# Patient Record
Sex: Male | Born: 1955 | Race: Black or African American | Hispanic: No | Marital: Married | Smoking: Current every day smoker
Health system: Southern US, Community
[De-identification: ages and names within clinical notes are randomized; demographics above are authoritative.]

## PROBLEM LIST (undated history)

## (undated) ENCOUNTER — Emergency Department: Admission: EM | Discharge status: Absent without leave | Payer: Medicare Other

## (undated) DIAGNOSIS — S62109A Fracture of unspecified carpal bone, unspecified wrist, initial encounter for closed fracture: Secondary | ICD-10-CM

## (undated) DIAGNOSIS — K7460 Unspecified cirrhosis of liver: Secondary | ICD-10-CM

## (undated) DIAGNOSIS — N179 Acute kidney failure, unspecified: Secondary | ICD-10-CM

## (undated) HISTORY — DX: Fracture of unspecified carpal bone, unspecified wrist, initial encounter for closed fracture: S62.109A

## (undated) HISTORY — DX: Acute kidney failure, unspecified: N17.9

## (undated) HISTORY — PX: FRACTURE SURGERY: SHX138

---

## 2005-05-13 ENCOUNTER — Other Ambulatory Visit: Payer: Self-pay

## 2005-05-13 ENCOUNTER — Emergency Department: Payer: Self-pay | Admitting: Emergency Medicine

## 2005-05-13 IMAGING — CR DG CHEST 2V
1 series · 2 of 2 positions shown · non-contrast
Comparison: none

REASON FOR EXAM: chest pain
COMMENTS:

PROCEDURE:     DXR - DXR CHEST PA (OR AP) AND LATERAL  - [DATE] [DATE]
RESULT:     The lungs are hyperinflated.  There is no focal infiltrate.  The
heart is not enlarged. The pulmonary vascularity is not engorged.  There is
tortuosity of the descending thoracic aorta.

[Series 1: view not recorded · 0.17mm/px · 2 of 2 slices shown]
[im 1/2]
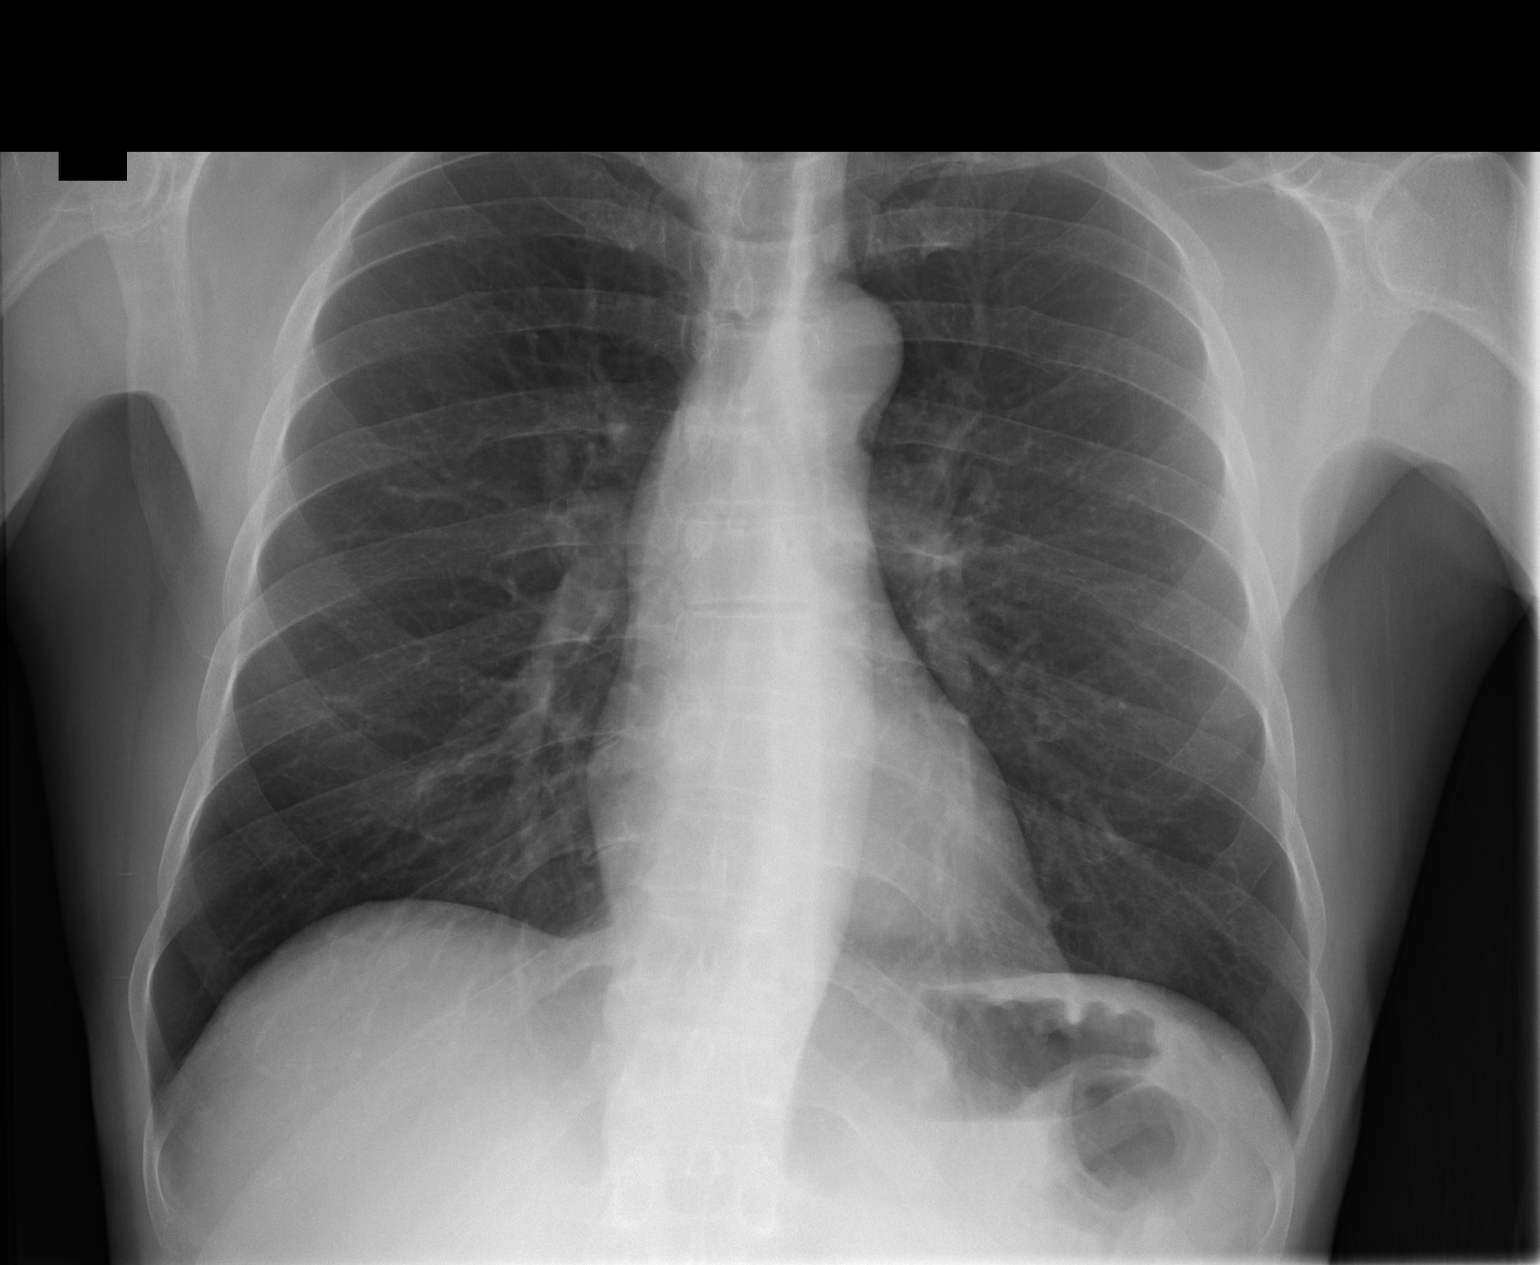
[im 2/2]
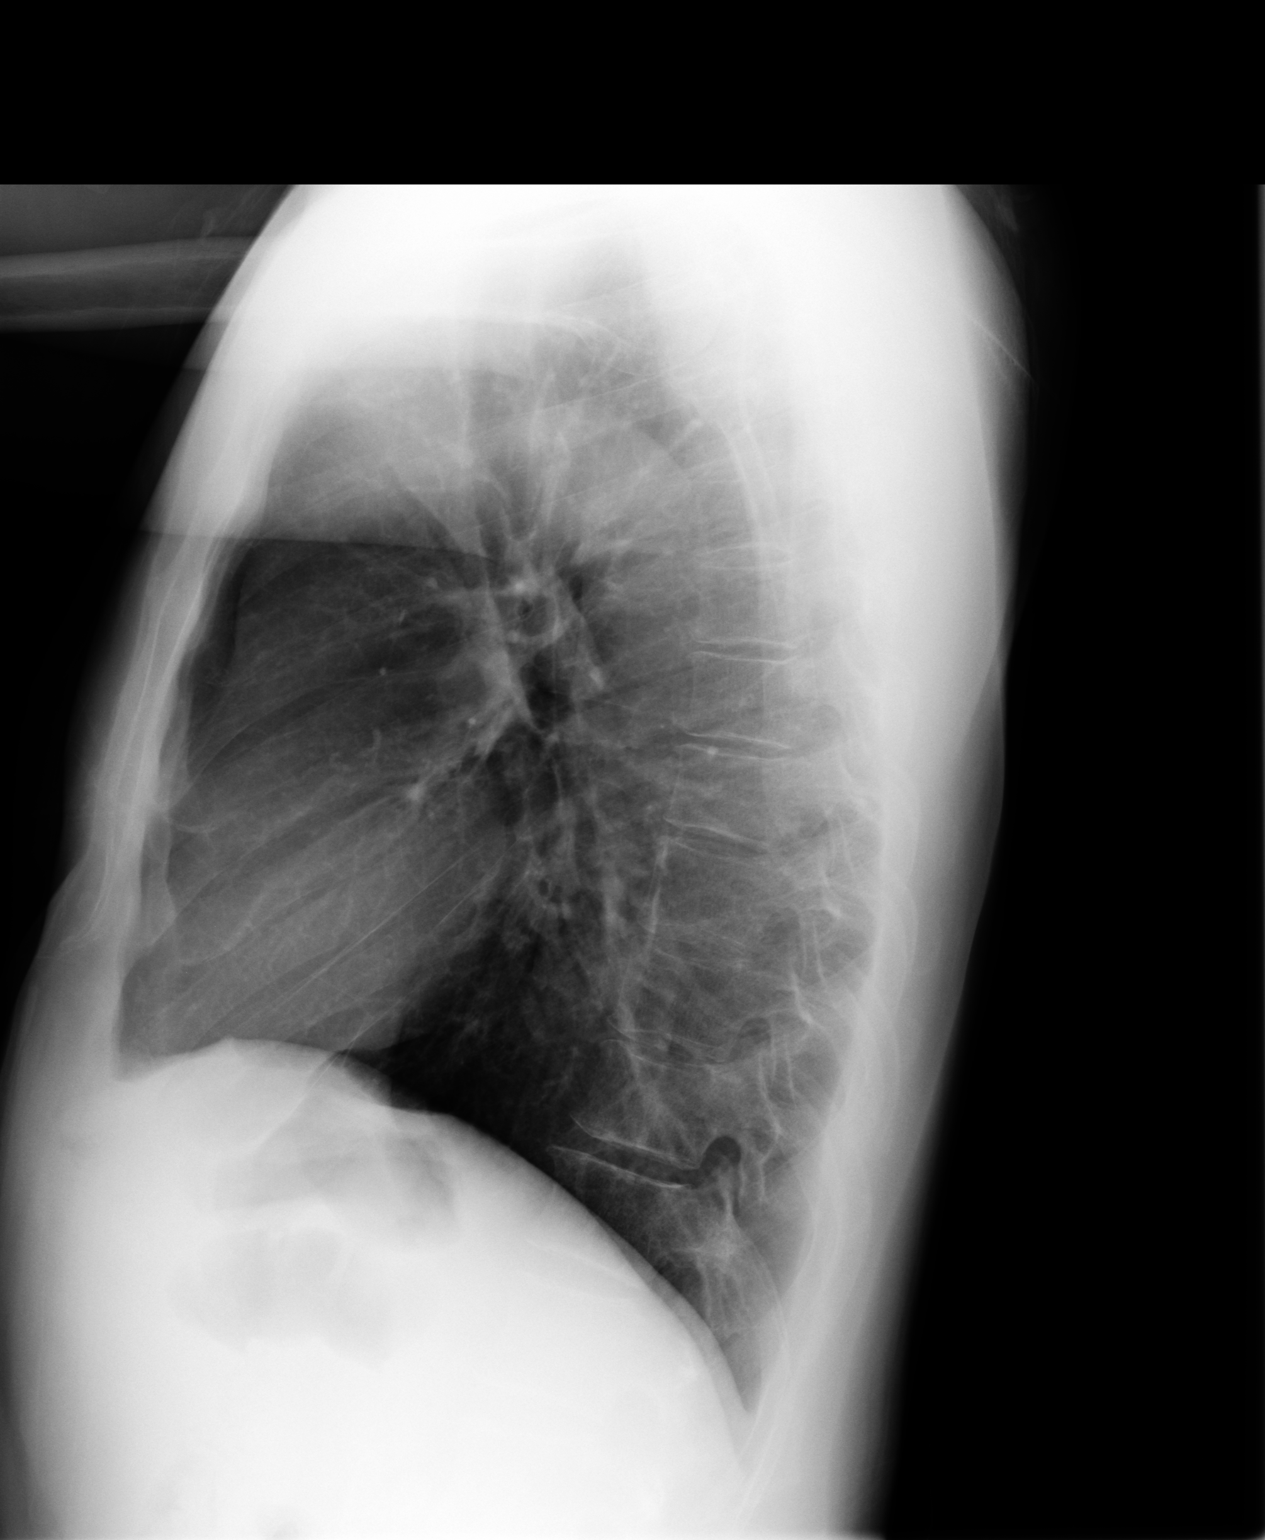

[2 of 2 positions shown; findings below may reference images not displayed]

IMPRESSION: 1)There are findings consistent with COPD.  I see no evidence of CHF nor of
pneumonia.

## 2005-12-07 ENCOUNTER — Emergency Department: Payer: Self-pay | Admitting: Emergency Medicine

## 2005-12-07 IMAGING — CR DG ABDOMEN 3V
1 series · 4 of 4 positions shown · non-contrast
Comparison: none

REASON FOR EXAM: Abdominal pain, pt in rm 16
COMMENTS:

[Series 1: view not recorded · 0.17mm/px · 4 of 4 slices shown]
[im 1/4]
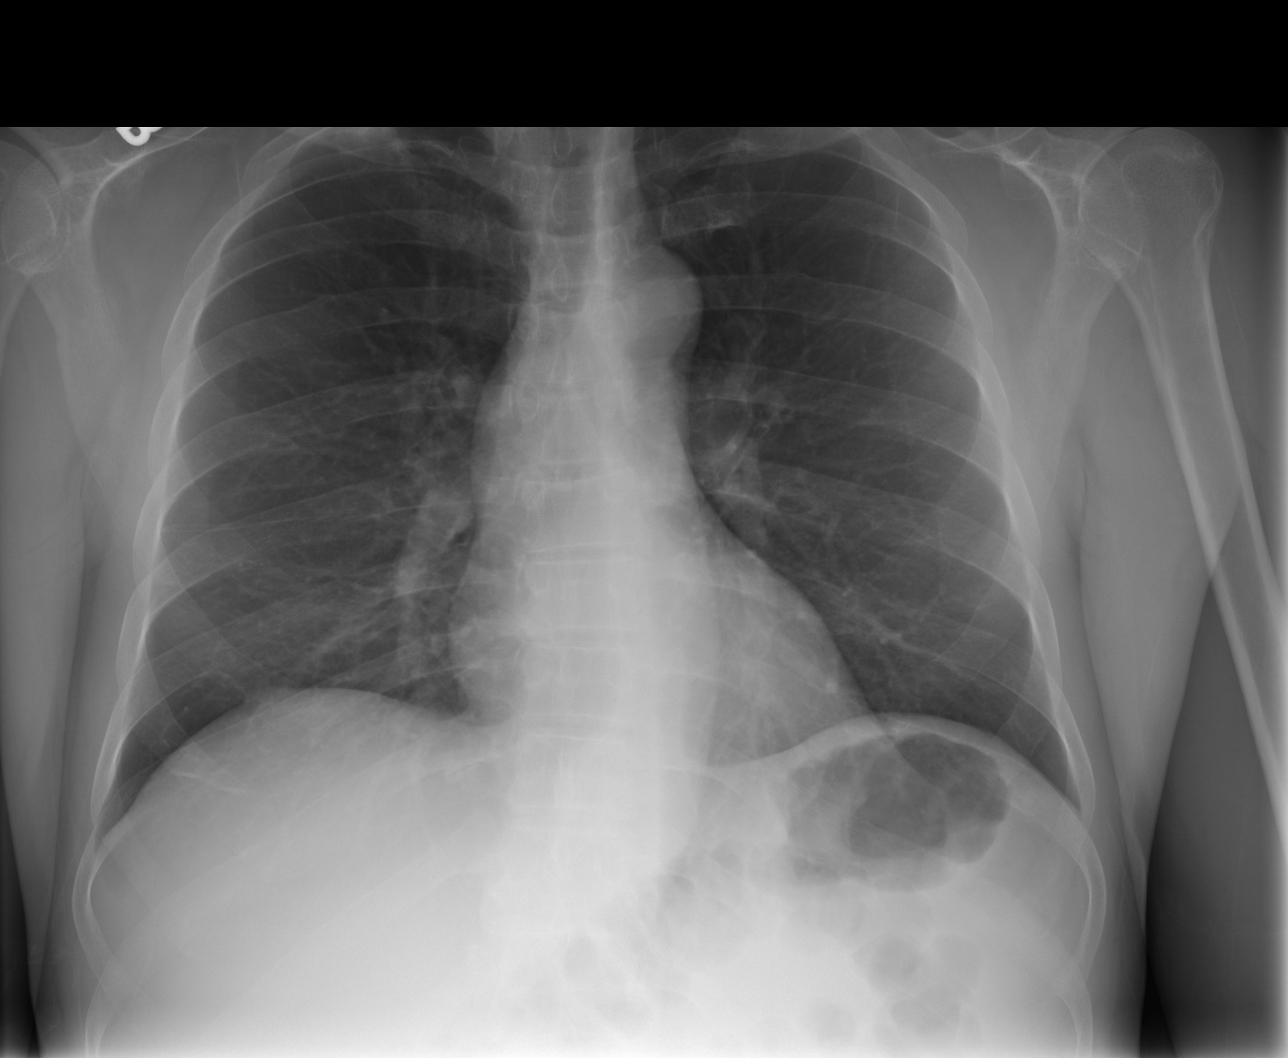
[im 2/4]
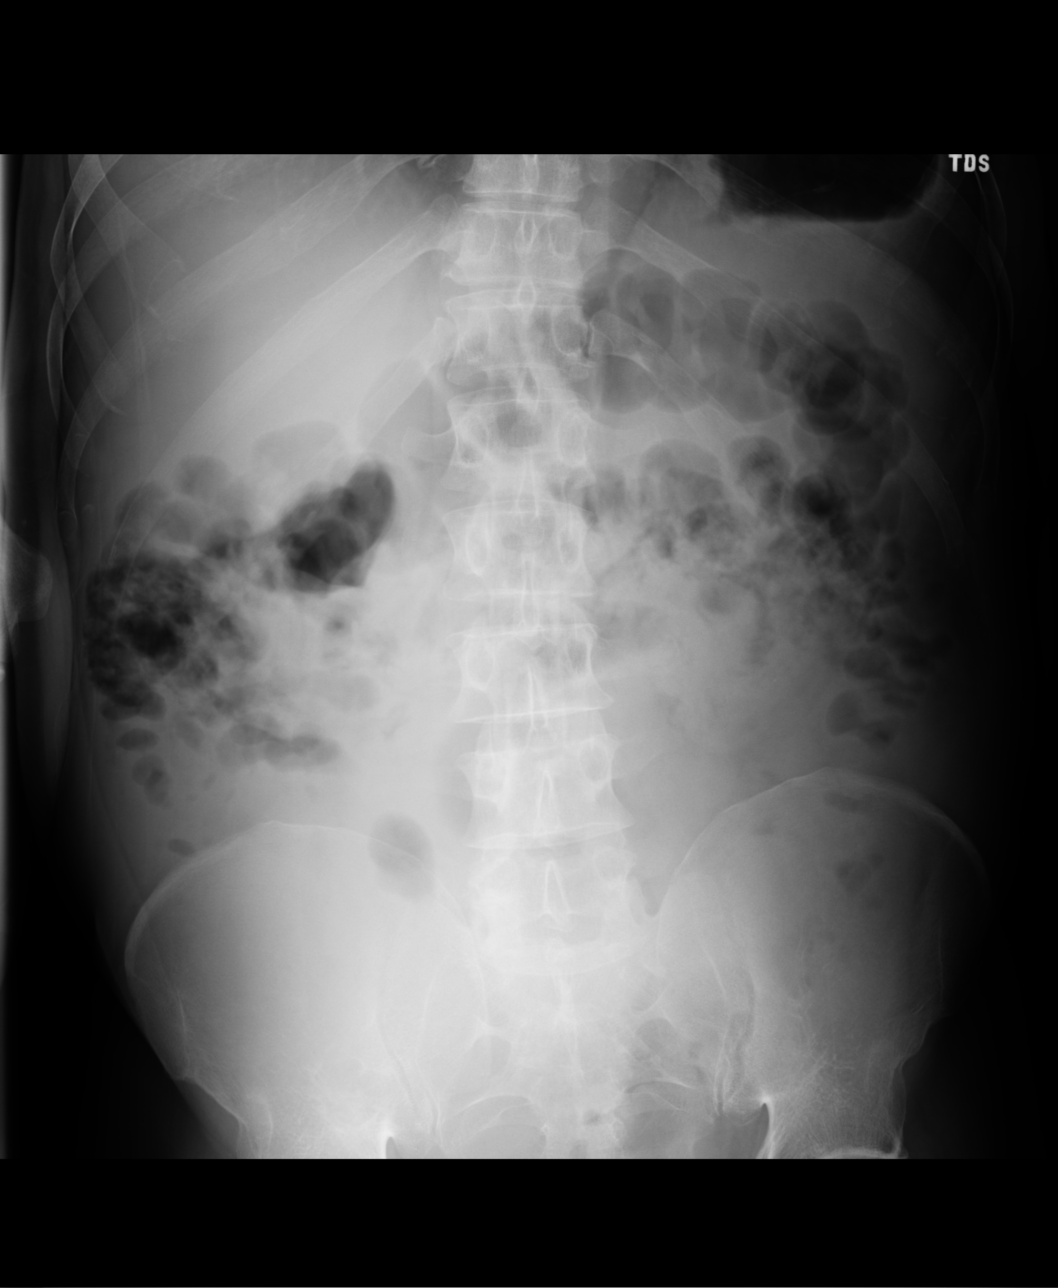
[im 3/4]
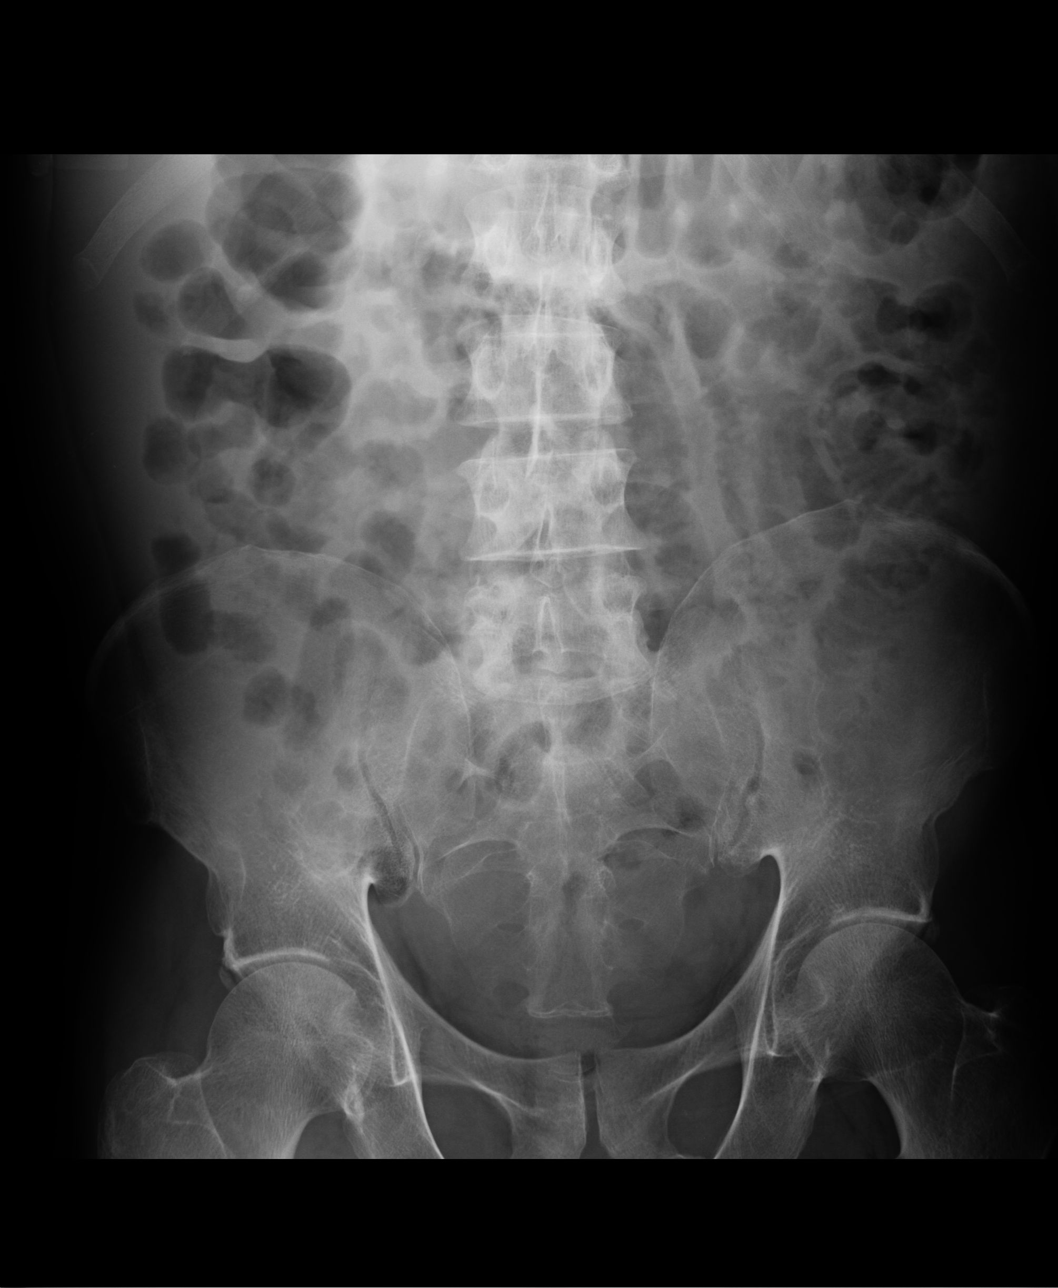
[im 4/4]
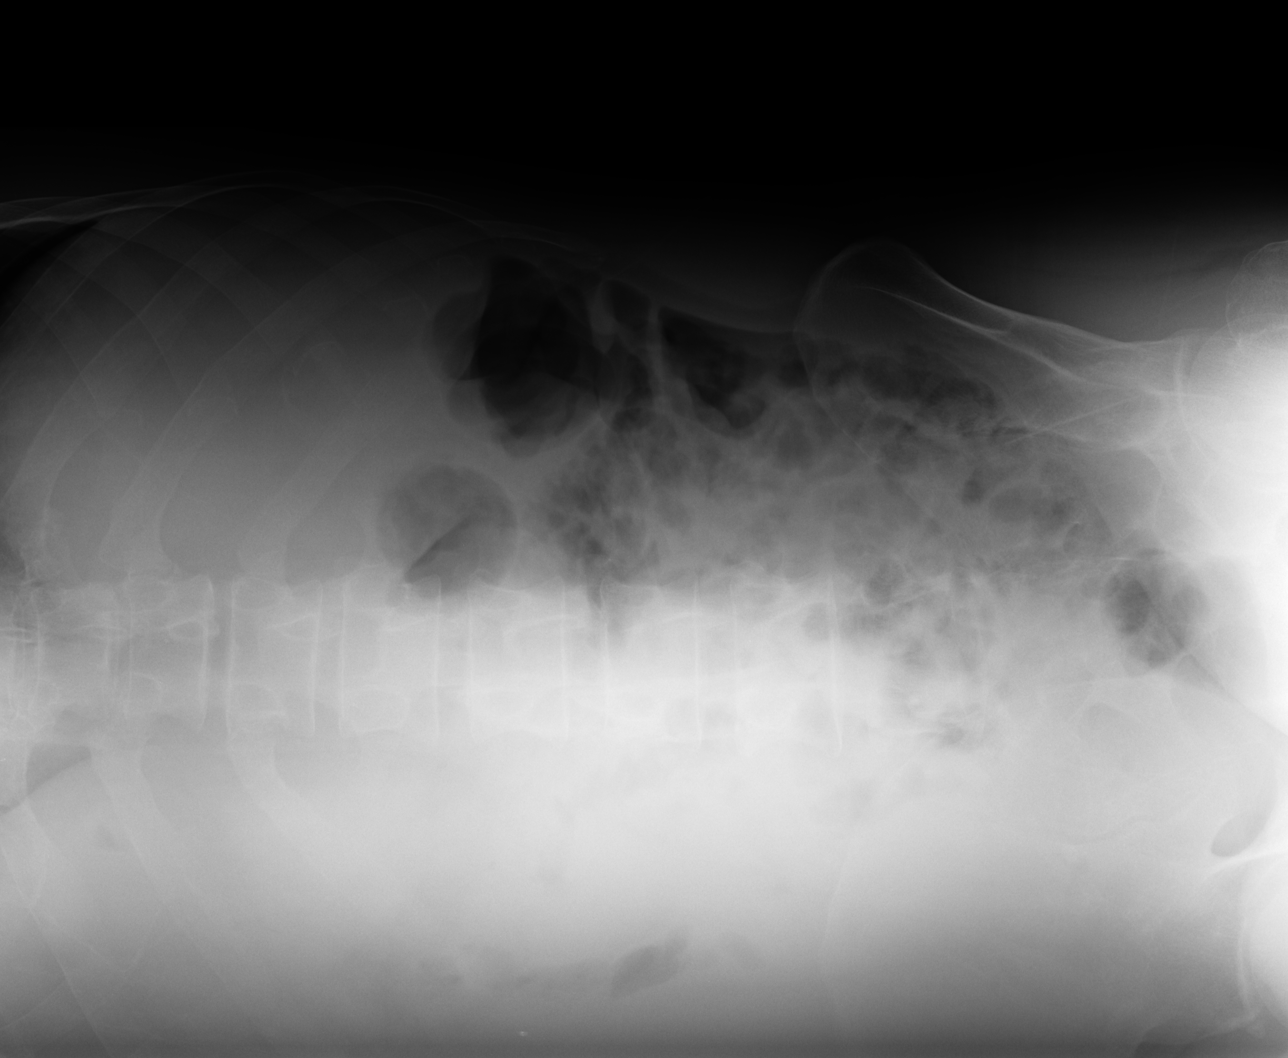

[4 of 4 positions shown; findings below may reference images not displayed]

PROCEDURE:     DXR - DXR ABDOMEN 3-WAY (INCL PA CXR)  - [DATE] [DATE]

RESULT:       Comparison is made to study [DATE].

The chest x-ray reveals the lungs to be adequately inflated.  There is no
focal infiltrate.  No free subdiaphragmatic gas collections are seen.
Views of the abdomen reveal a nonspecific bowel gas pattern with no evidence
of obstruction nor of definite evidence of ileus.  Gas is seen within the
colon as well as within loops of small bowel.  No acute bony abnormality is
seen.
IMPRESSION: The bowel gas pattern may indicate gastroenteritis.   I do not see evidence
of obstruction.  If the patient's symptoms persist, further evaluation with
CT scanning may be value.

## 2005-12-07 IMAGING — US ABDOMEN ULTRASOUND
1 series · 17 of 25 positions shown · non-contrast
Comparison: none

REASON FOR EXAM: abd pain pt in rm 16
COMMENTS:

[Series 1: abdomen ultrasound · 17 of 71 slices shown]
[im 1/71]
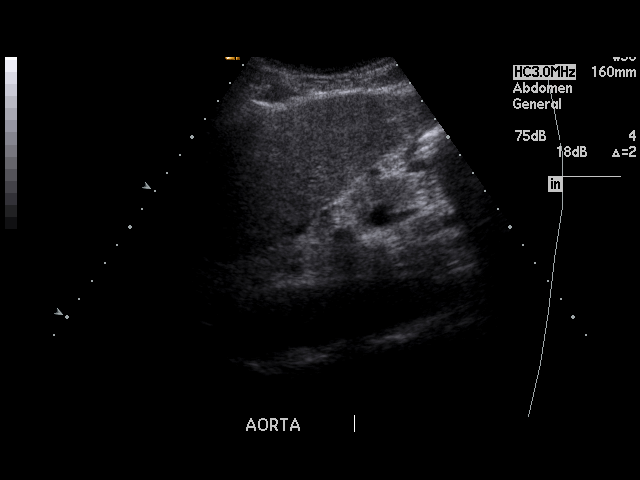
[im 6/71]
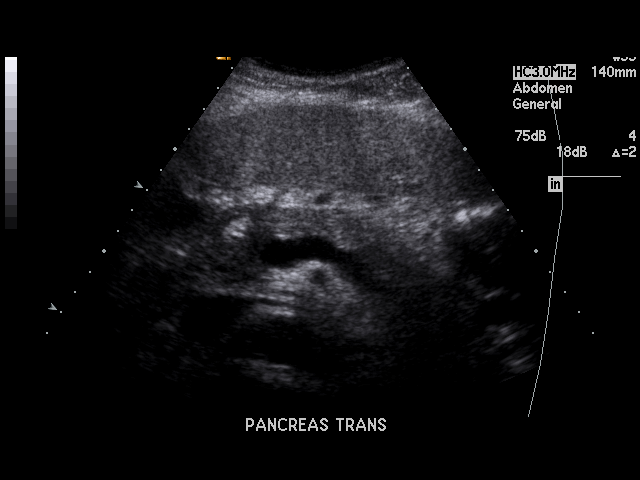
[im 9/71]
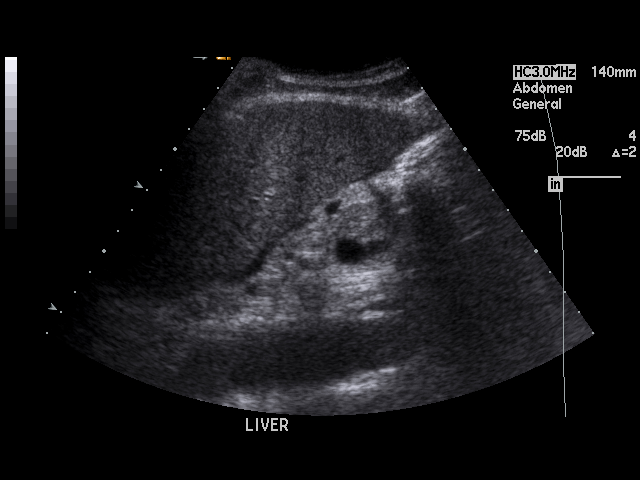
[im 15/71]
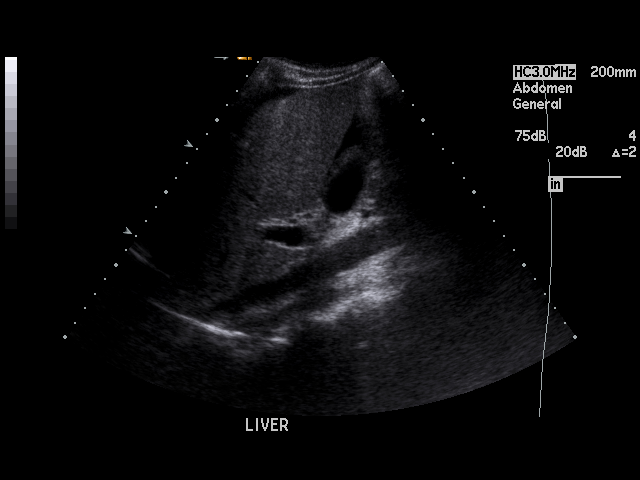
[im 18/71]
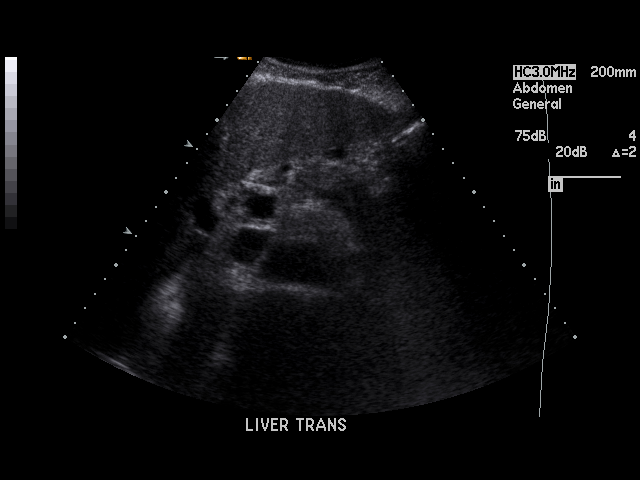
[im 24/71]
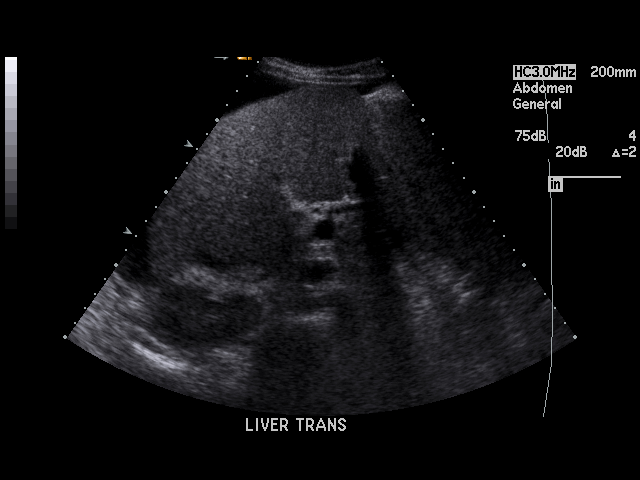
[im 27/71]
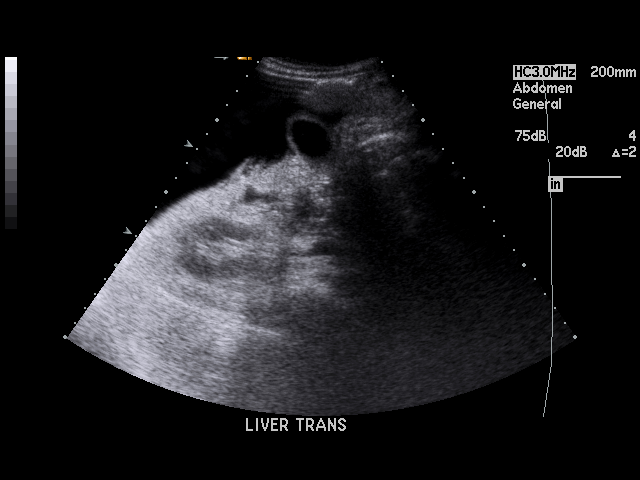
[im 33/71]
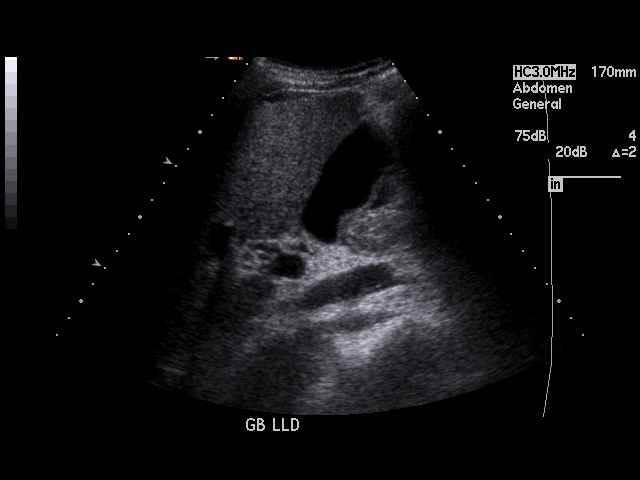
[im 36/71]
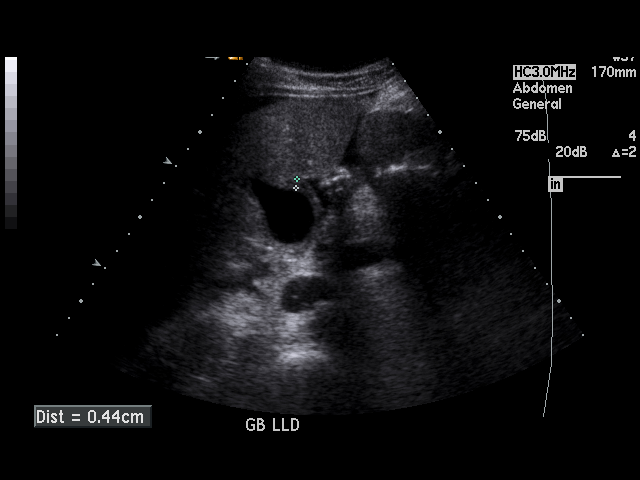
[im 38/71]
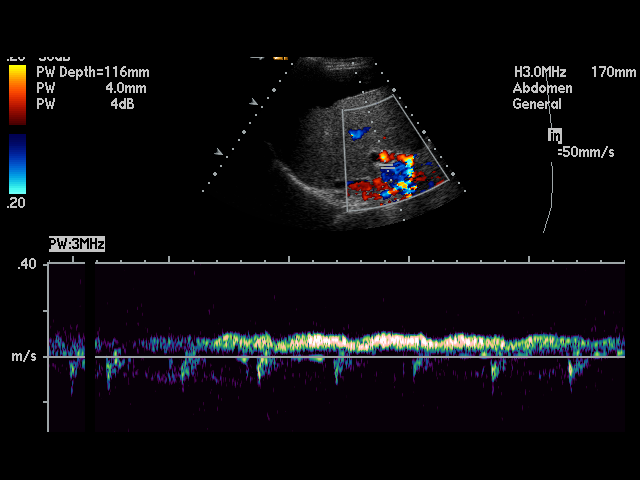
[im 44/71]
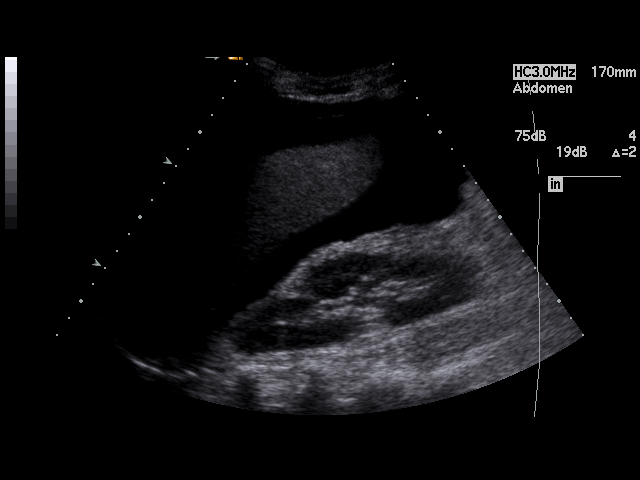
[im 47/71]
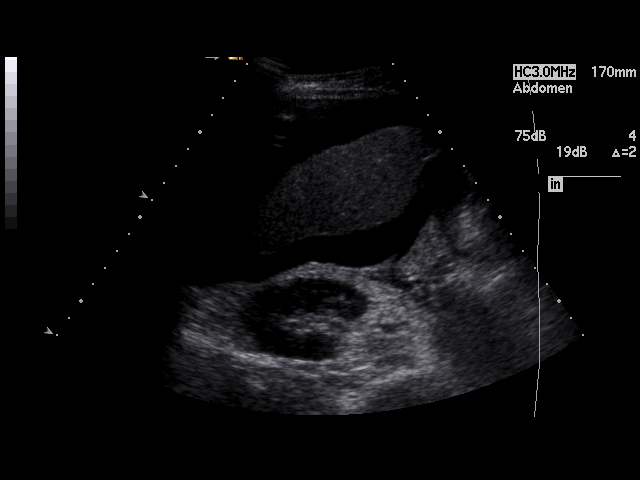
[im 53/71]
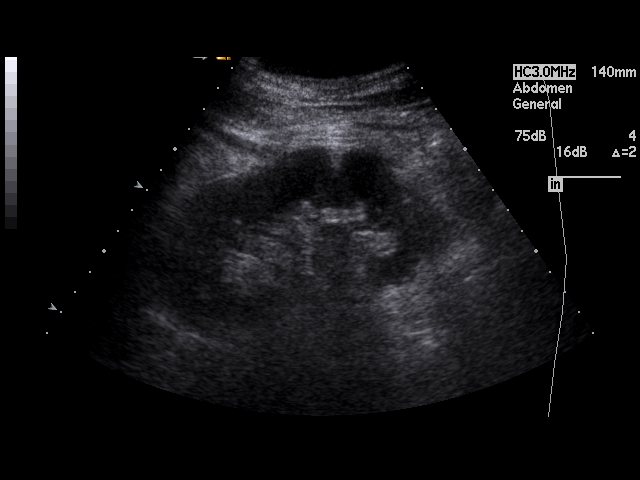
[im 56/71]
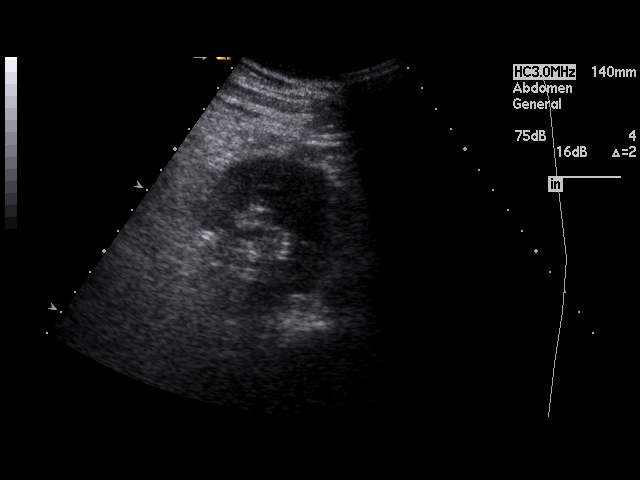
[im 62/71]
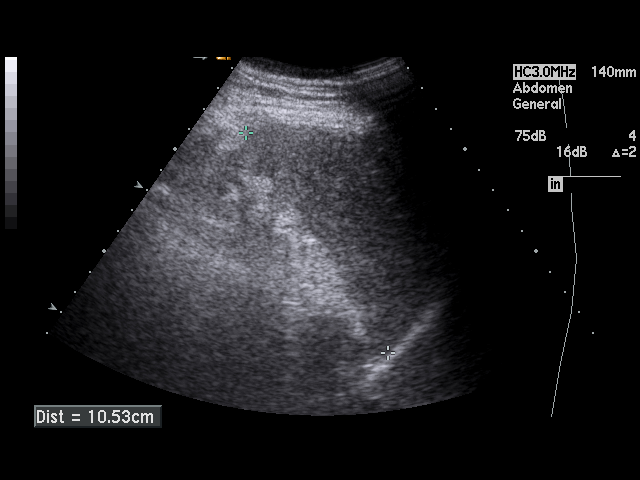
[im 65/71]
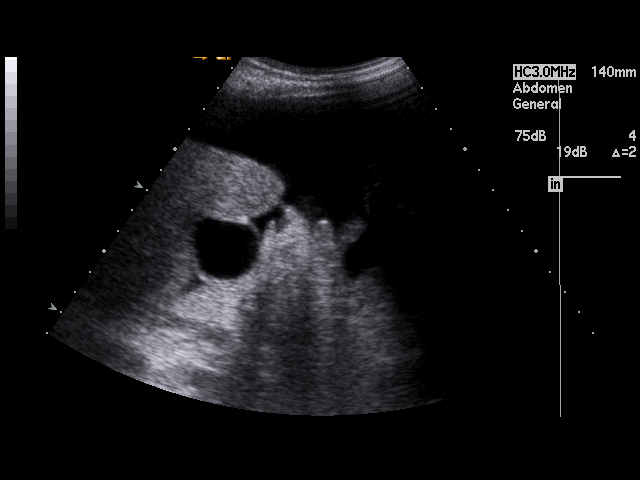
[im 71/71]
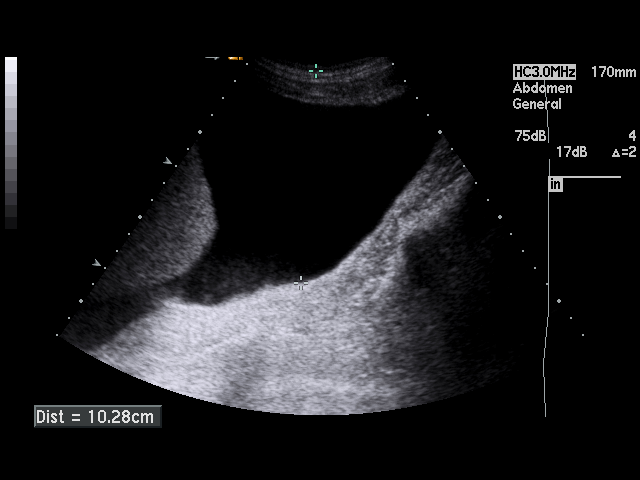

[17 of 25 positions shown; findings below may reference images not displayed]

PROCEDURE:     US  - US ABDOMEN GENERAL SURVEY  - [DATE] [DATE]

RESULT:     The liver is abnormal in appearance demonstrating an irregular
surface and a slightly increased echotexture. There is ascites present. No
intrahepatic ductal dilation is seen.  The portal venous flow direction is
normal. The pancreas is normal in echotexture and contour with no mass or
ductal dilation.  The gallbladder is adequately distended with no evidence
of stones.  The common bile duct measures 4.1 mm in diameter.  Subjective
wall thickening is seen to 4.4 mm, but this may be in part related to the
presence of ascites. The spleen measures 10.5 cm in length.  The abdominal
aorta is top normal in caliber at 2.8 cm.  The kidneys are normal in
echotexture with no evidence of obstruction nor mass.
IMPRESSION: 1)There is considerable amount of ascites present.  The liver is small and
irregular in its appearance consistent with cirrhosis. I do not see
objective evidence of varices and the portal venous flow direction is normal
toward the liver.

2)No gallstones are identified. There is mild gallbladder wall thickening,
which may be related to the ascites.  There is no sonographic Murphy sign.

## 2007-08-31 ENCOUNTER — Emergency Department: Payer: Self-pay | Admitting: Emergency Medicine

## 2007-08-31 IMAGING — CT CT HEAD WITHOUT CONTRAST
2 series · 16 of 30 positions shown, 20 images · non-contrast
Comparison: none

REASON FOR EXAM: trauma, pain
COMMENTS:

[Series 2: without · axial · non-contrast · 0.42mm/px · z∈[+290,+414]mm · 13 of 31 slices shown, 17 images]
[im 3/31  brain]
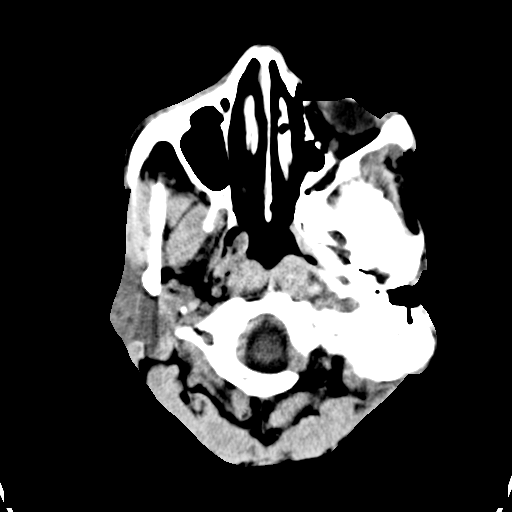
[im 3/31  bone]
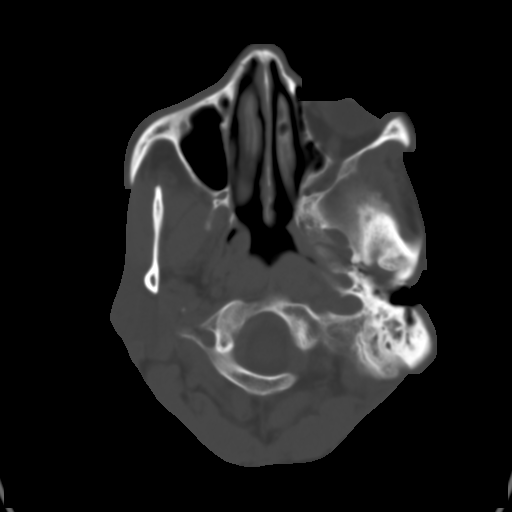
[im 5/31  brain]
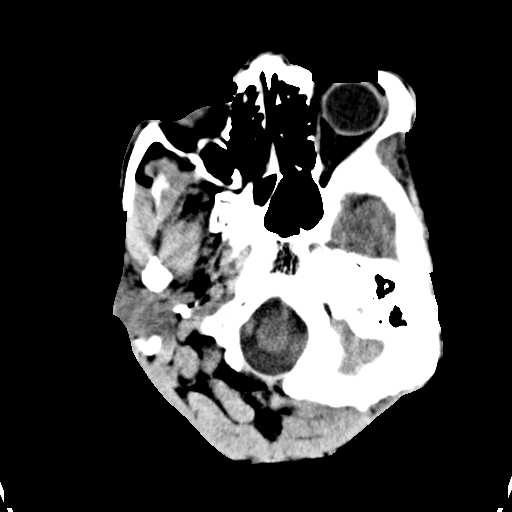
[im 7/31  brain]
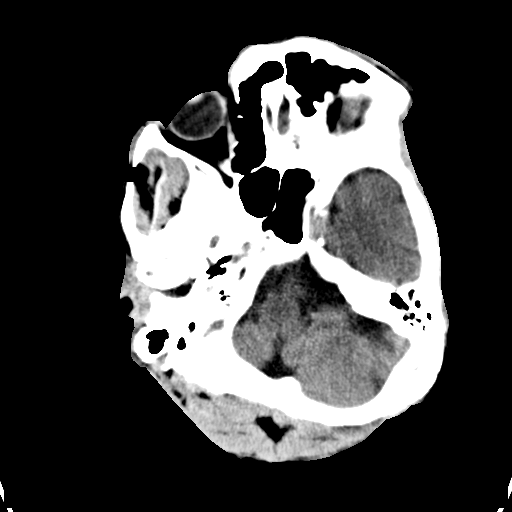
[im 9/31  brain]
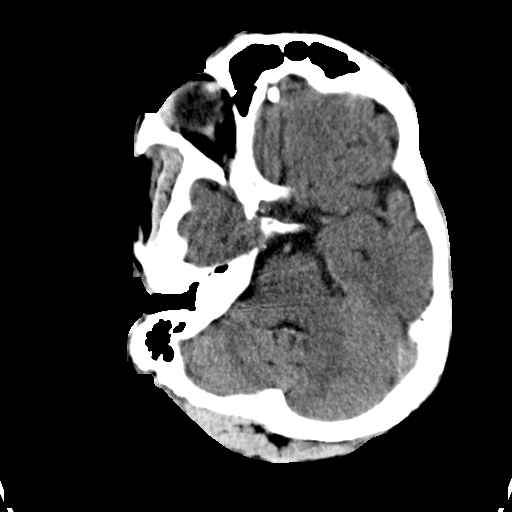
[im 11/31  brain]
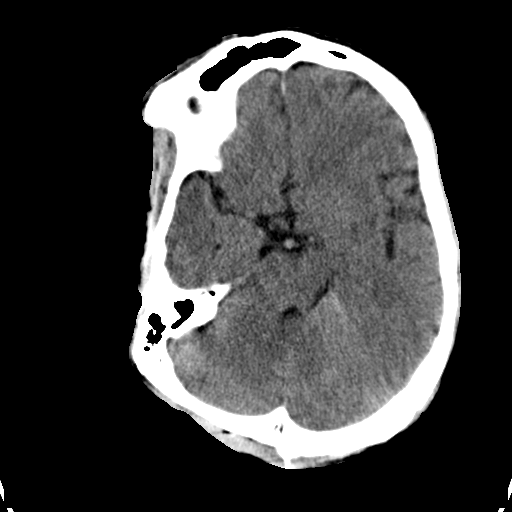
[im 11/31  bone]
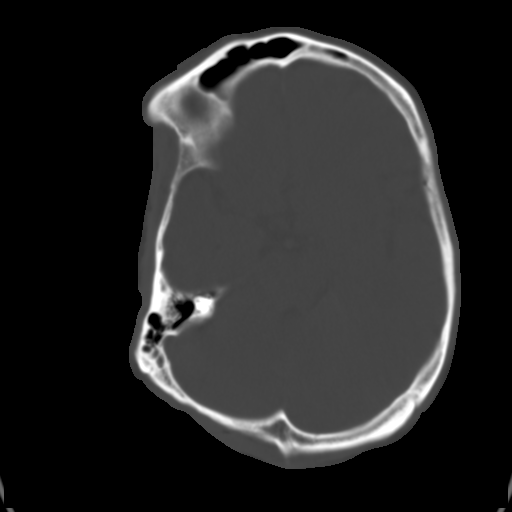
[im 13/31  brain]
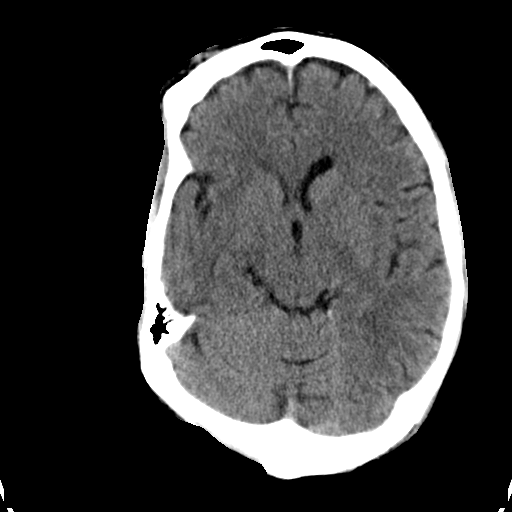
[im 16/31  brain]
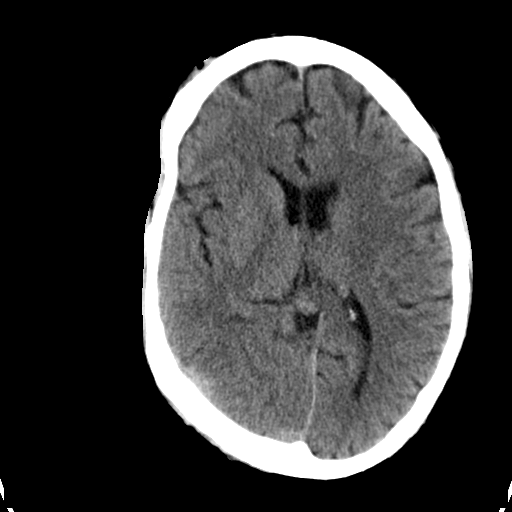
[im 18/31  brain]
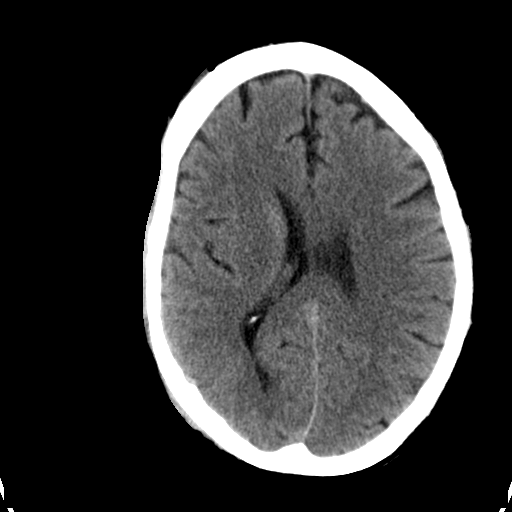
[im 20/31  brain]
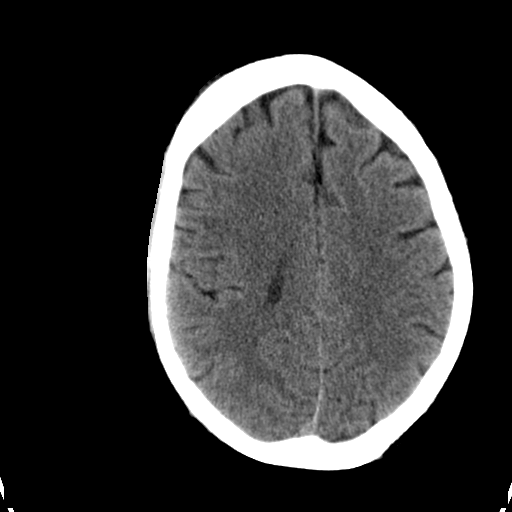
[im 20/31  bone]
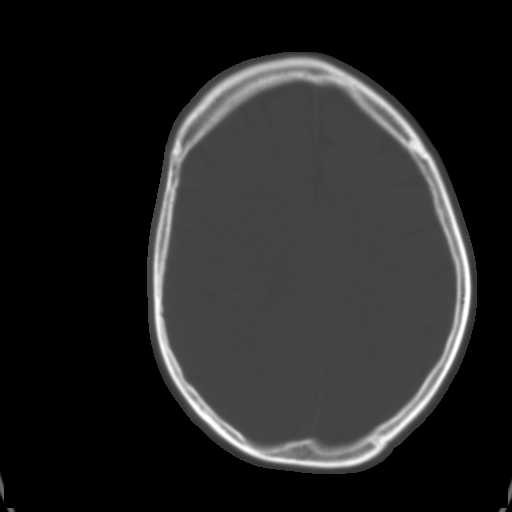
[im 22/31  brain]
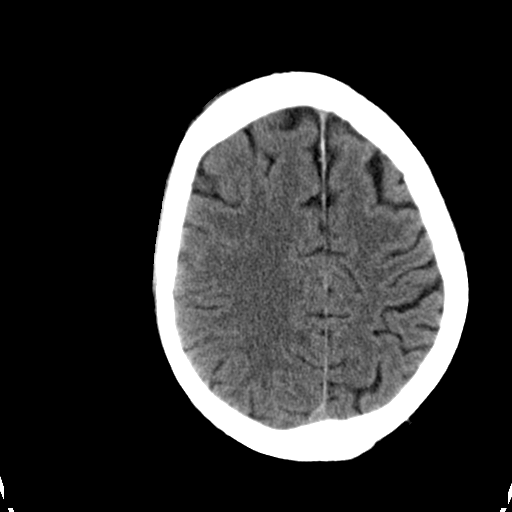
[im 24/31  brain]
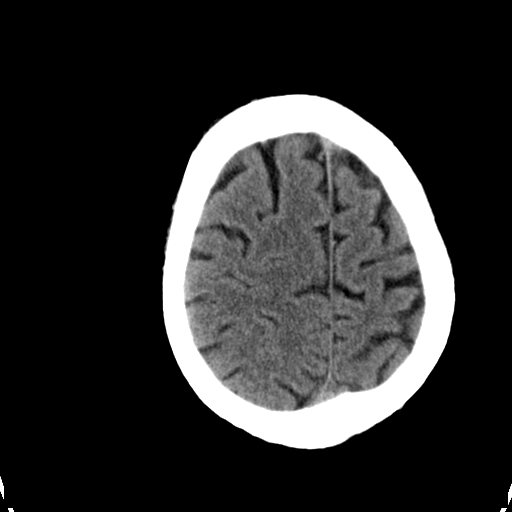
[im 26/31  brain]
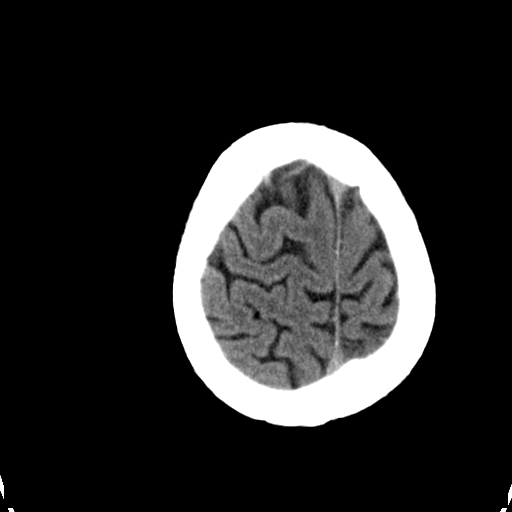
[im 28/31  brain]
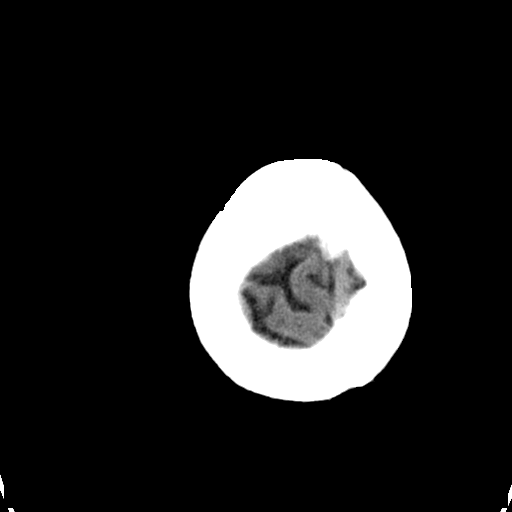
[im 28/31  bone]
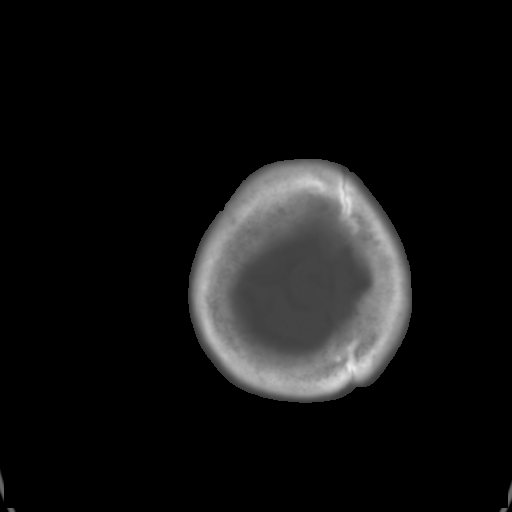

[Series 3: bone · axial · 0.42mm/px · z∈[+290,+330]mm · 3 of 31 slices shown]
[im 3/31  bone]
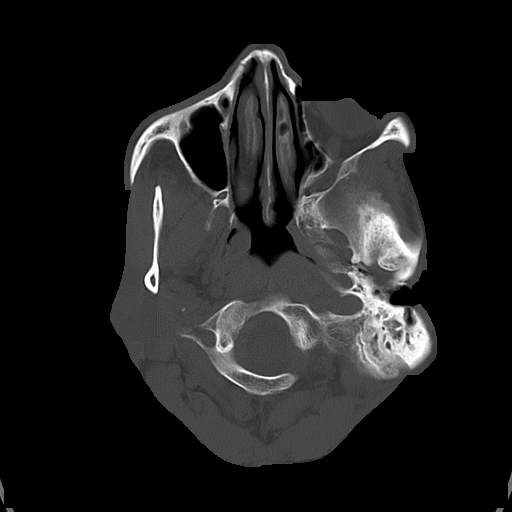
[im 7/31  bone]
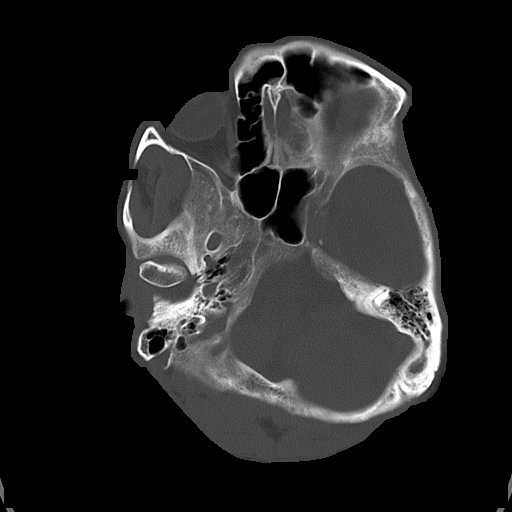
[im 11/31  bone]
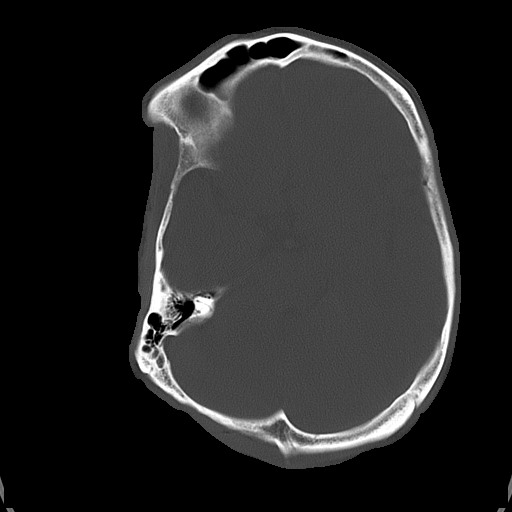

[16 of 30 positions shown; findings below may reference images not displayed]

PROCEDURE:     CT  - CT HEAD WITHOUT CONTRAST  - [DATE] [DATE]

RESULT:     Noncontrast emergent CT of the brain is performed. There is
swelling over the right supraorbital and frontal region. The ventricles and
sulci show mild atrophy. There is no hemorrhage, mass, mass-effect or
midline shift. The head is tilted in the gantry. The paranasal sinuses and
mastoids demonstrate normal aeration. There is no skull fracture evident.
IMPRESSION: No acute intracranial abnormality. Soft tissue injury as
described.

## 2007-08-31 IMAGING — CT CT MAXILLOFACIAL WITHOUT CONTRAST
1 series · 16 of 30 positions shown, 20 images · non-contrast
Comparison: none

REASON FOR EXAM: trauma, pain
COMMENTS:

[Series 4: axial soft tissue · axial · 0.39mm/px · z∈[+189,+345]mm · 16 of 56 slices shown, 20 images]
[im 2/56  brain]
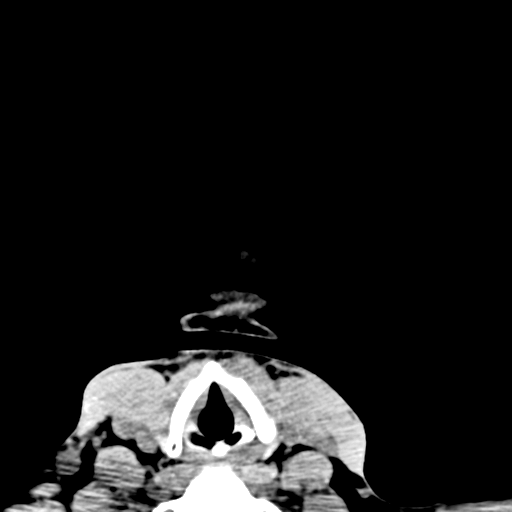
[im 2/56  bone]
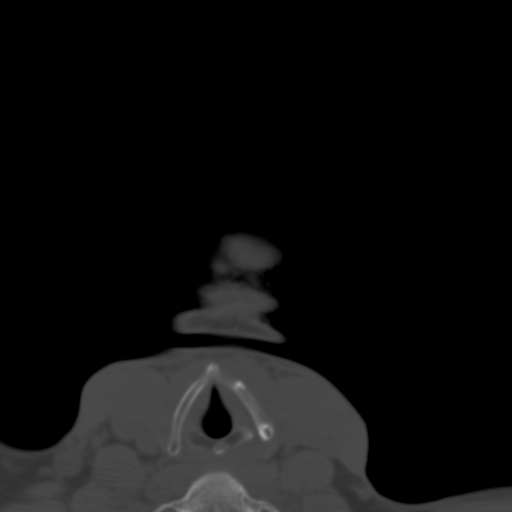
[im 6/56  bone]
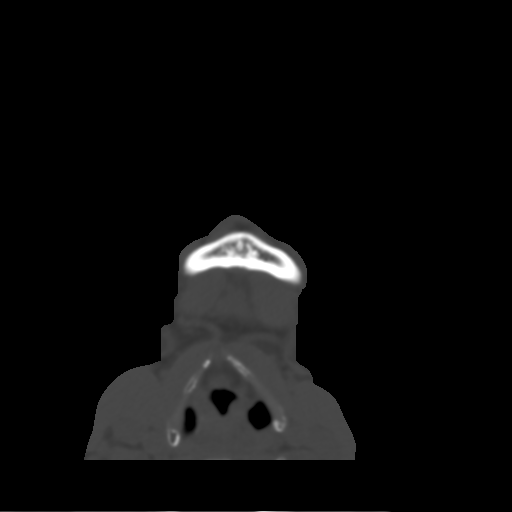
[im 10/56  bone]
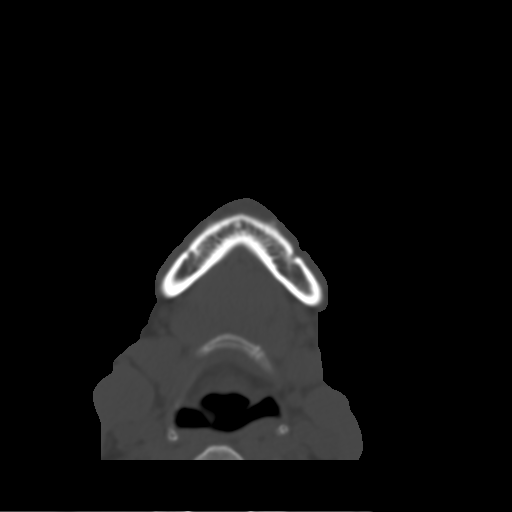
[im 14/56  bone]
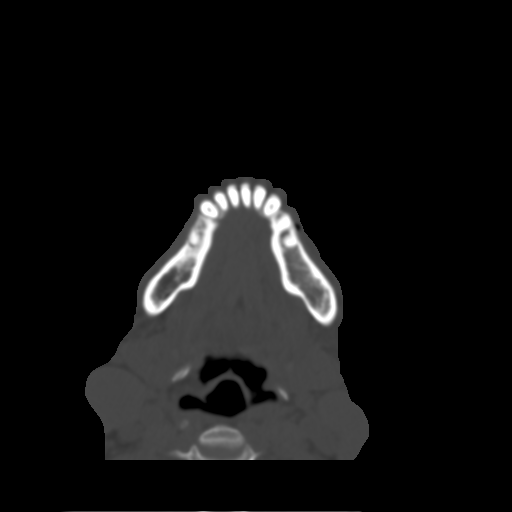
[im 16/56  brain]
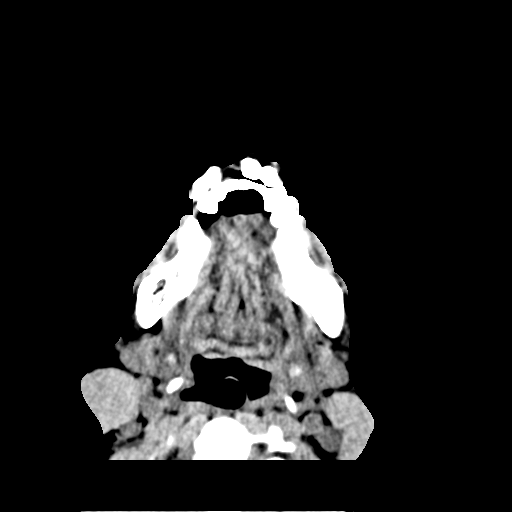
[im 16/56  bone]
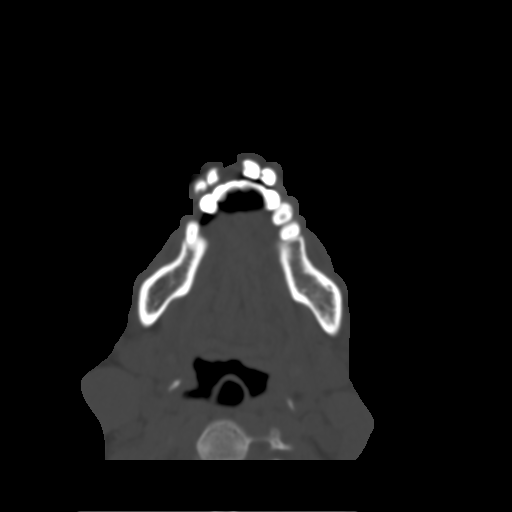
[im 19/56  bone]
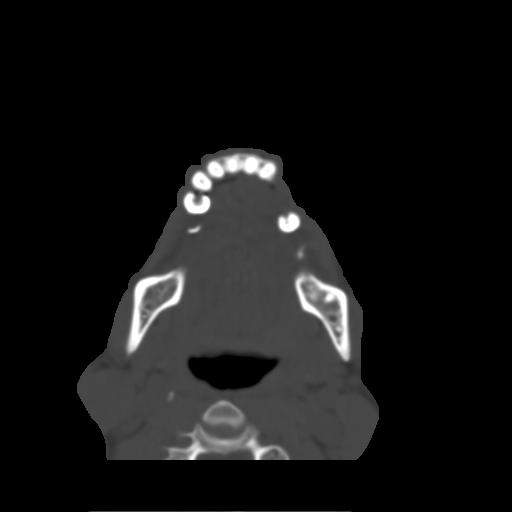
[im 23/56  bone]
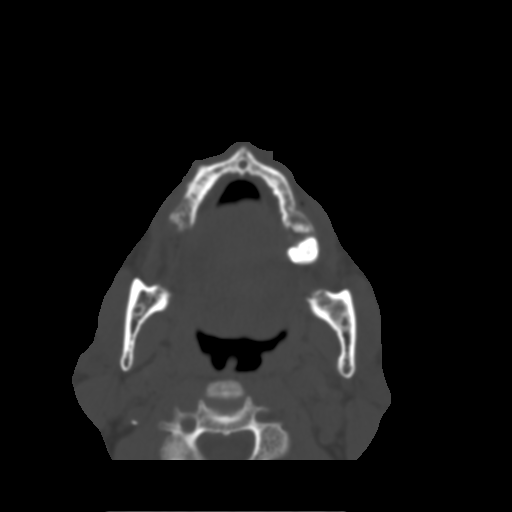
[im 27/56  bone]
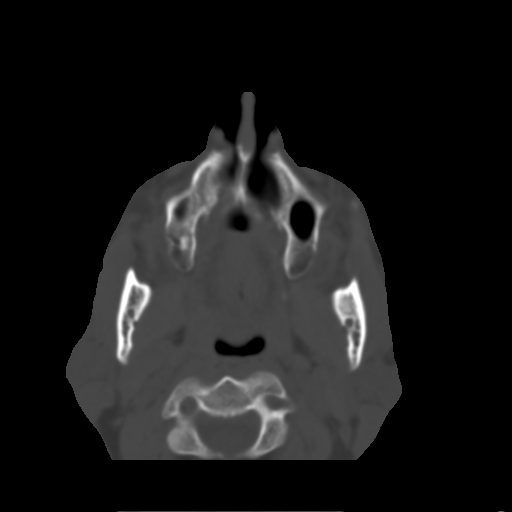
[im 29/56  brain]
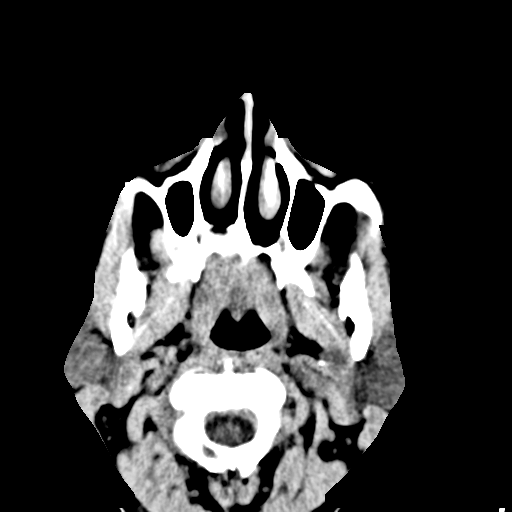
[im 29/56  bone]
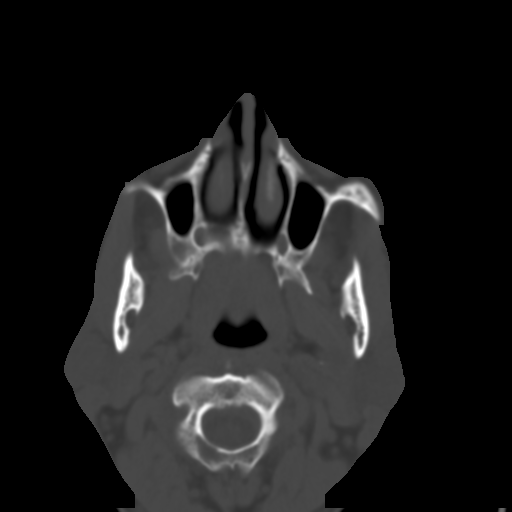
[im 33/56  bone]
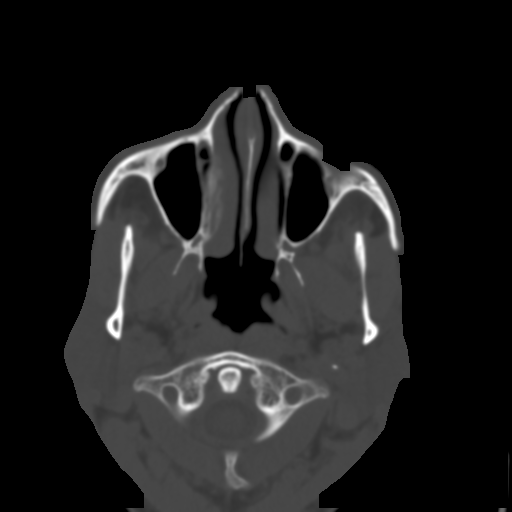
[im 37/56  bone]
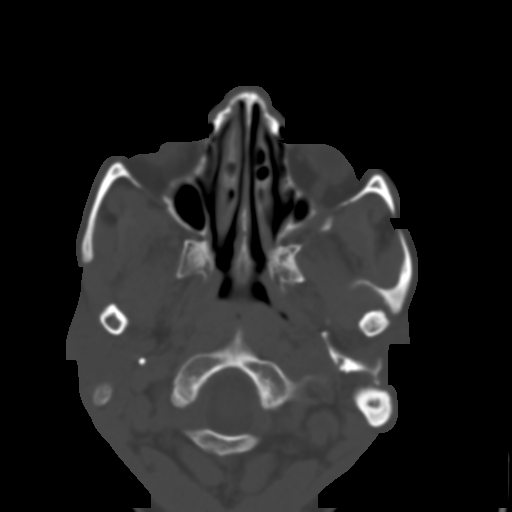
[im 40/56  bone]
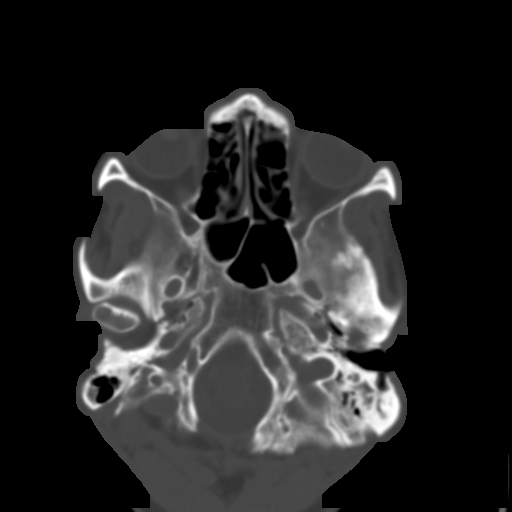
[im 42/56  brain]
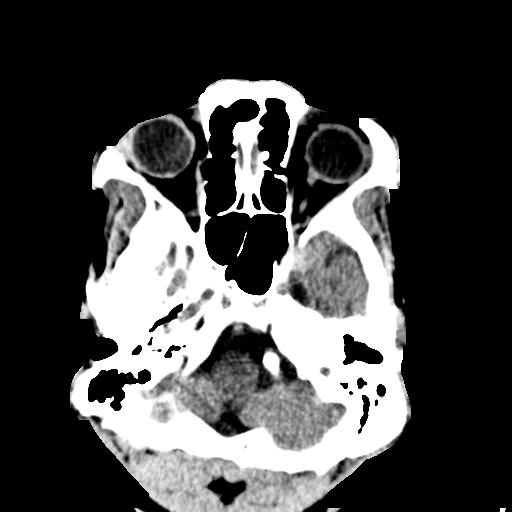
[im 42/56  bone]
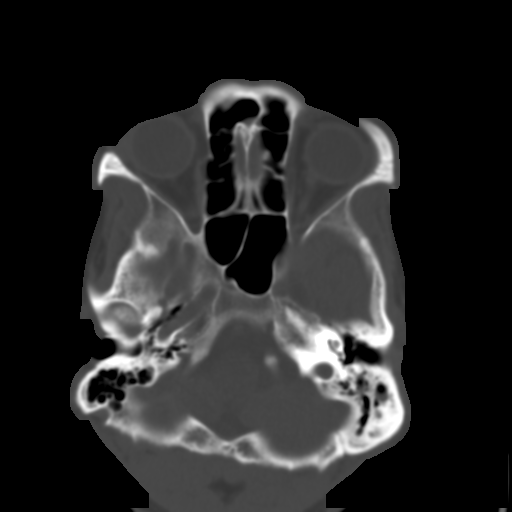
[im 46/56  bone]
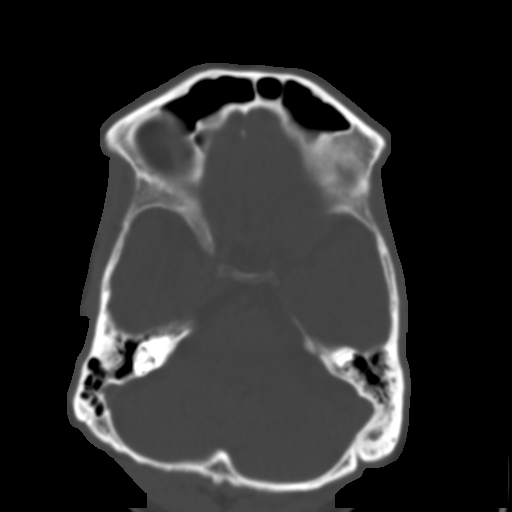
[im 50/56  bone]
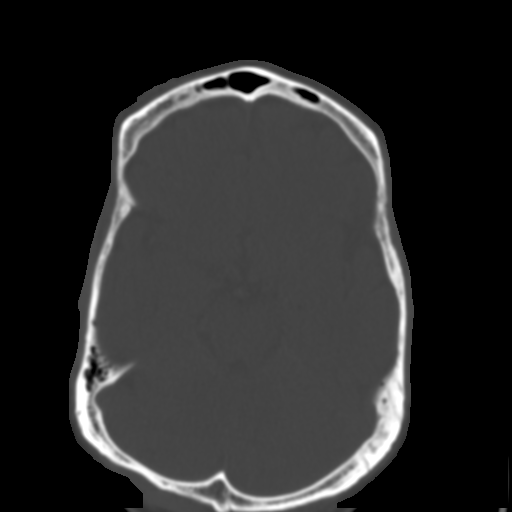
[im 54/56  bone]
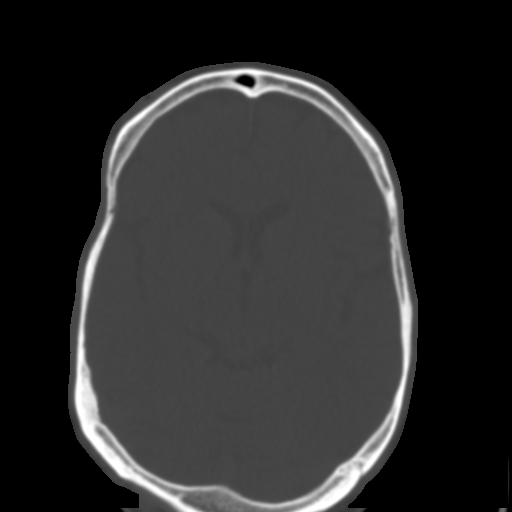

[16 of 30 positions shown; findings below may reference images not displayed]

PROCEDURE:     CT  - CT MAXILLOFACIAL AREA WO  - [DATE] [DATE]

RESULT:     Multislice axial acquisition with axial and coronal bone window
reconstruction demonstrates no evidence of fracture. Soft tissue swelling is
present in the supraorbital region on the right. There is some mild mucosal
thickening in the ethmoid sinuses.
IMPRESSION: Please see above.

## 2007-08-31 IMAGING — CR CERVICAL SPINE - COMPLETE 4+ VIEW
1 series · 6 of 6 positions shown · non-contrast
Comparison: none

REASON FOR EXAM: Pain
COMMENTS:

[Series 1: view not recorded · 0.17mm/px · 6 of 6 slices shown]
[im 1/6]
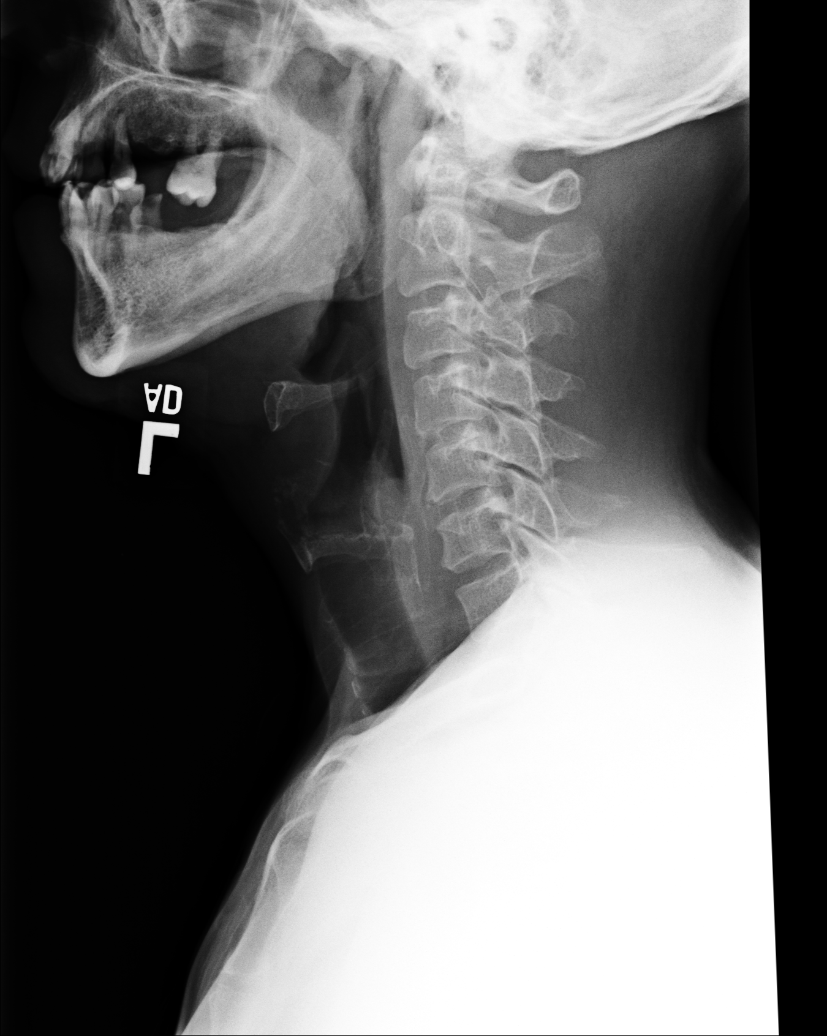
[im 2/6]
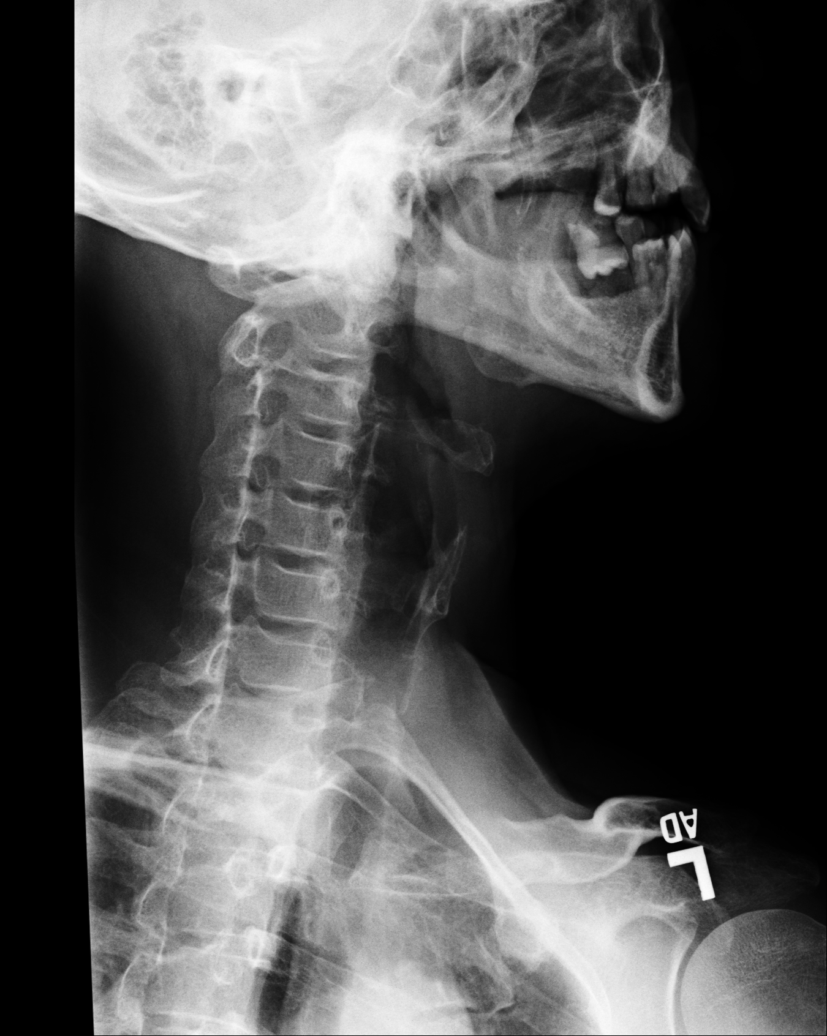
[im 3/6]
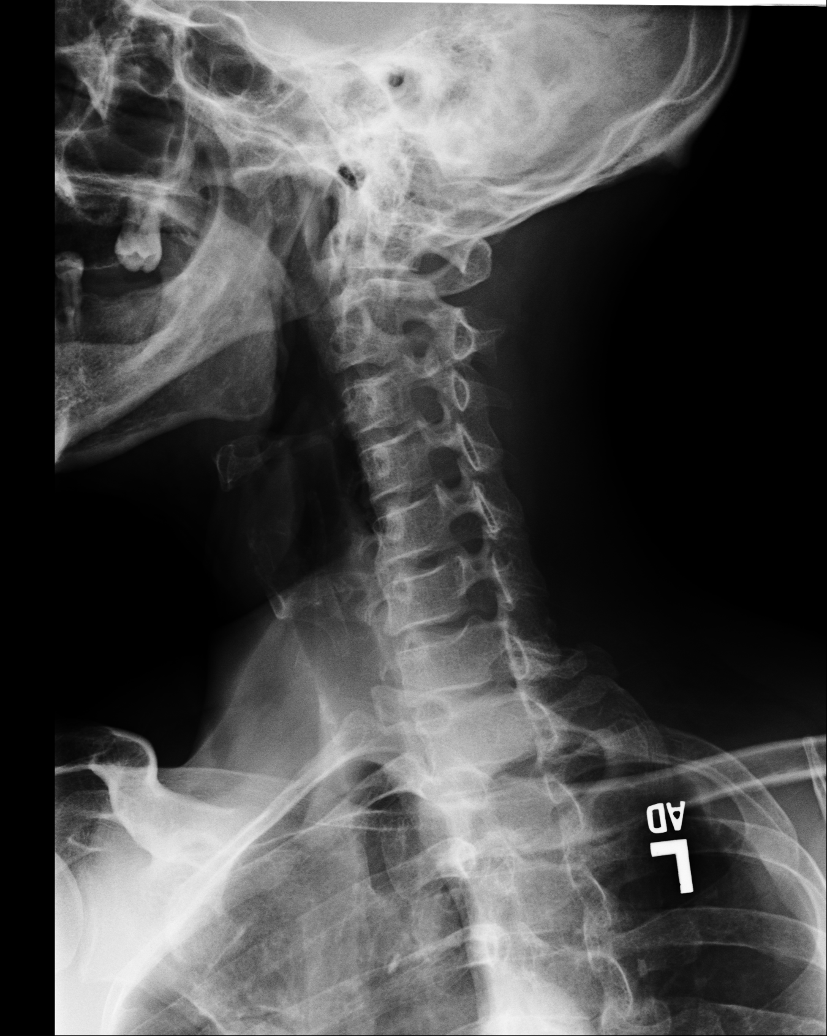
[im 4/6]
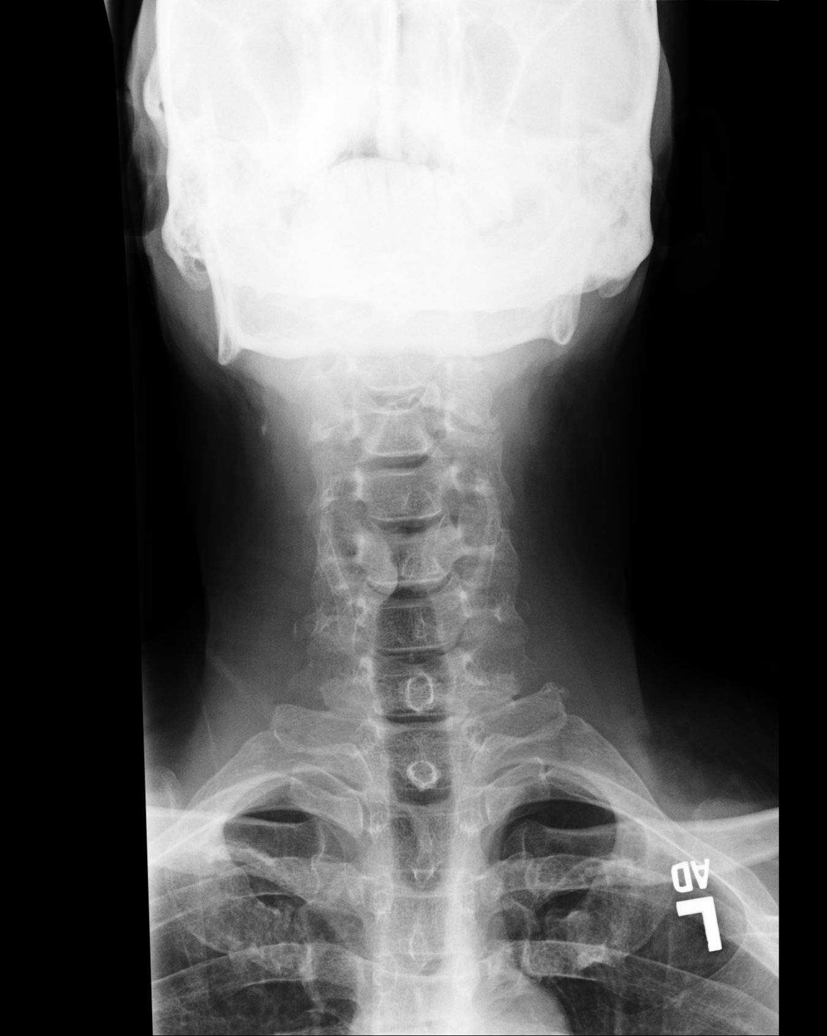
[im 5/6]
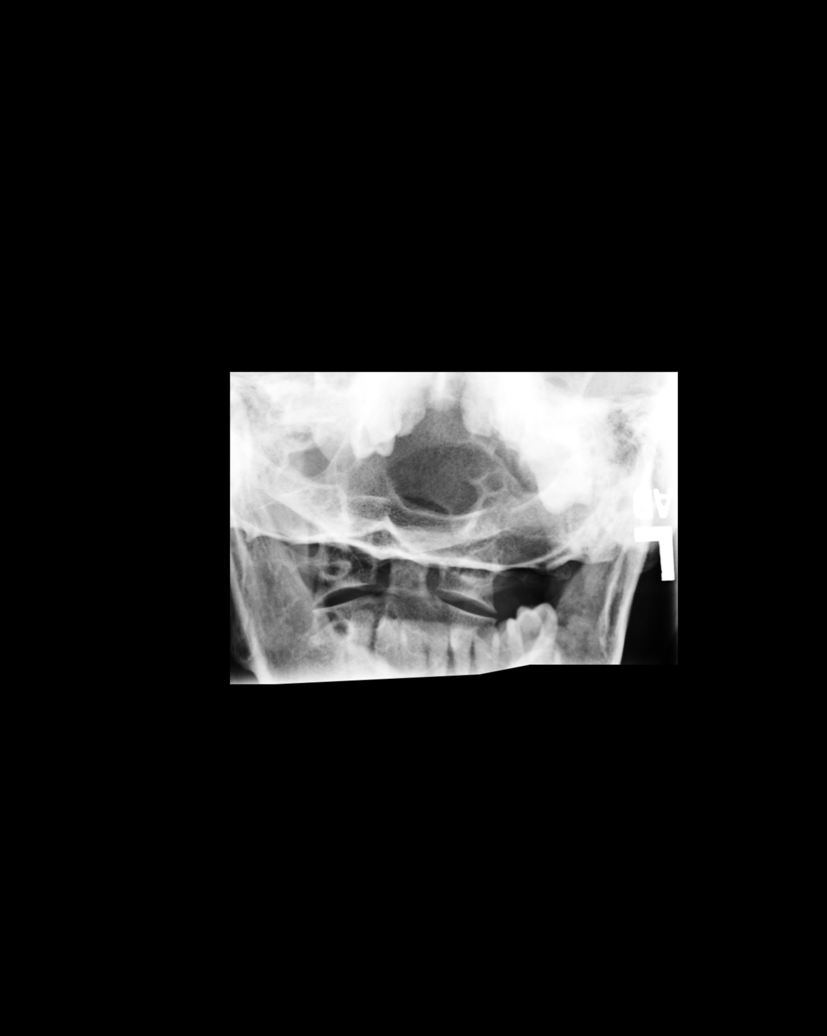
[im 6/6]
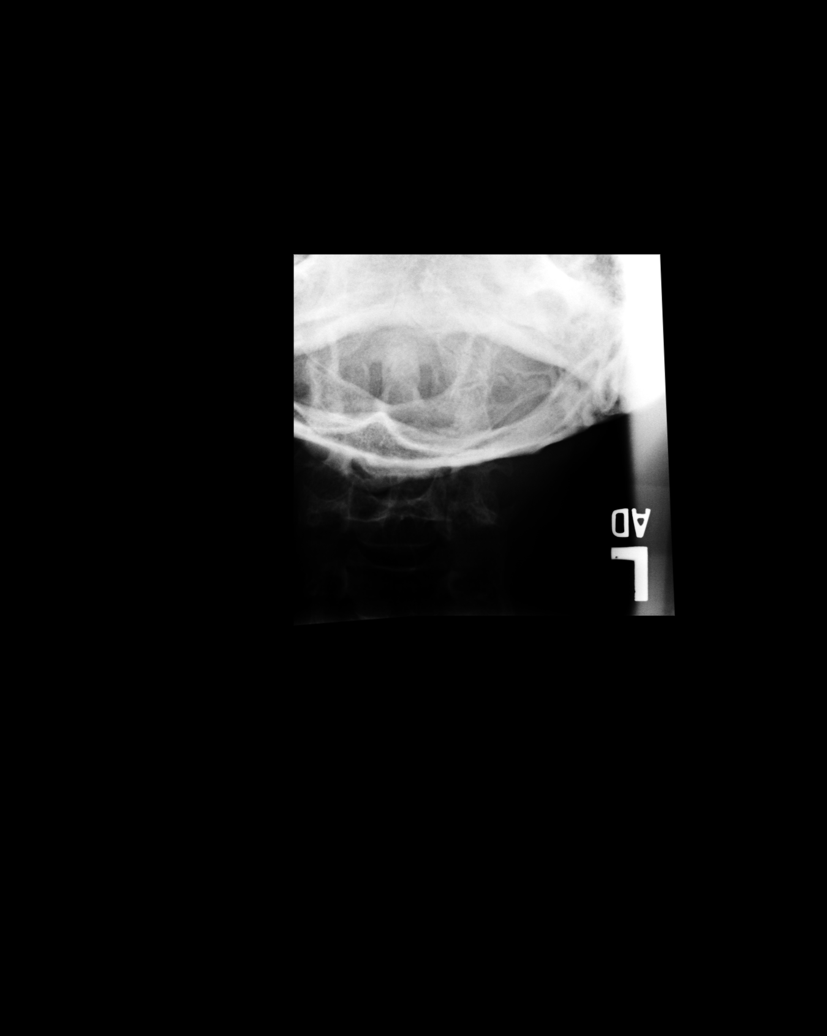

[6 of 6 positions shown; findings below may reference images not displayed]

PROCEDURE:     DXR - DXR CERVICAL SPINE COMPLETE  - [DATE] [DATE]

RESULT:     The cervical vertebral bodies are preserved in height. The
intervertebral disc space heights are reasonably well maintained. The
prevertebral soft tissue spaces are normal in appearance. The posterior
elements are intact. The oblique views reveal no high-grade bony
encroachment upon the neural foramina. The odontoid is intact.
IMPRESSION: I do not see evidence of acute bony abnormality of the
cervical spine. If occult trauma is strongly suspected, CT scanning is
available upon request.

## 2007-09-16 ENCOUNTER — Emergency Department: Payer: Self-pay | Admitting: Emergency Medicine

## 2010-12-15 ENCOUNTER — Emergency Department: Payer: Self-pay | Admitting: Emergency Medicine

## 2010-12-15 IMAGING — CR DG ABDOMEN 1V
1 series · 1 of 1 positions shown · non-contrast
Comparison: none

REASON FOR EXAM: abd pain - lower, that moves up into chest
COMMENTS:

[view not recorded]
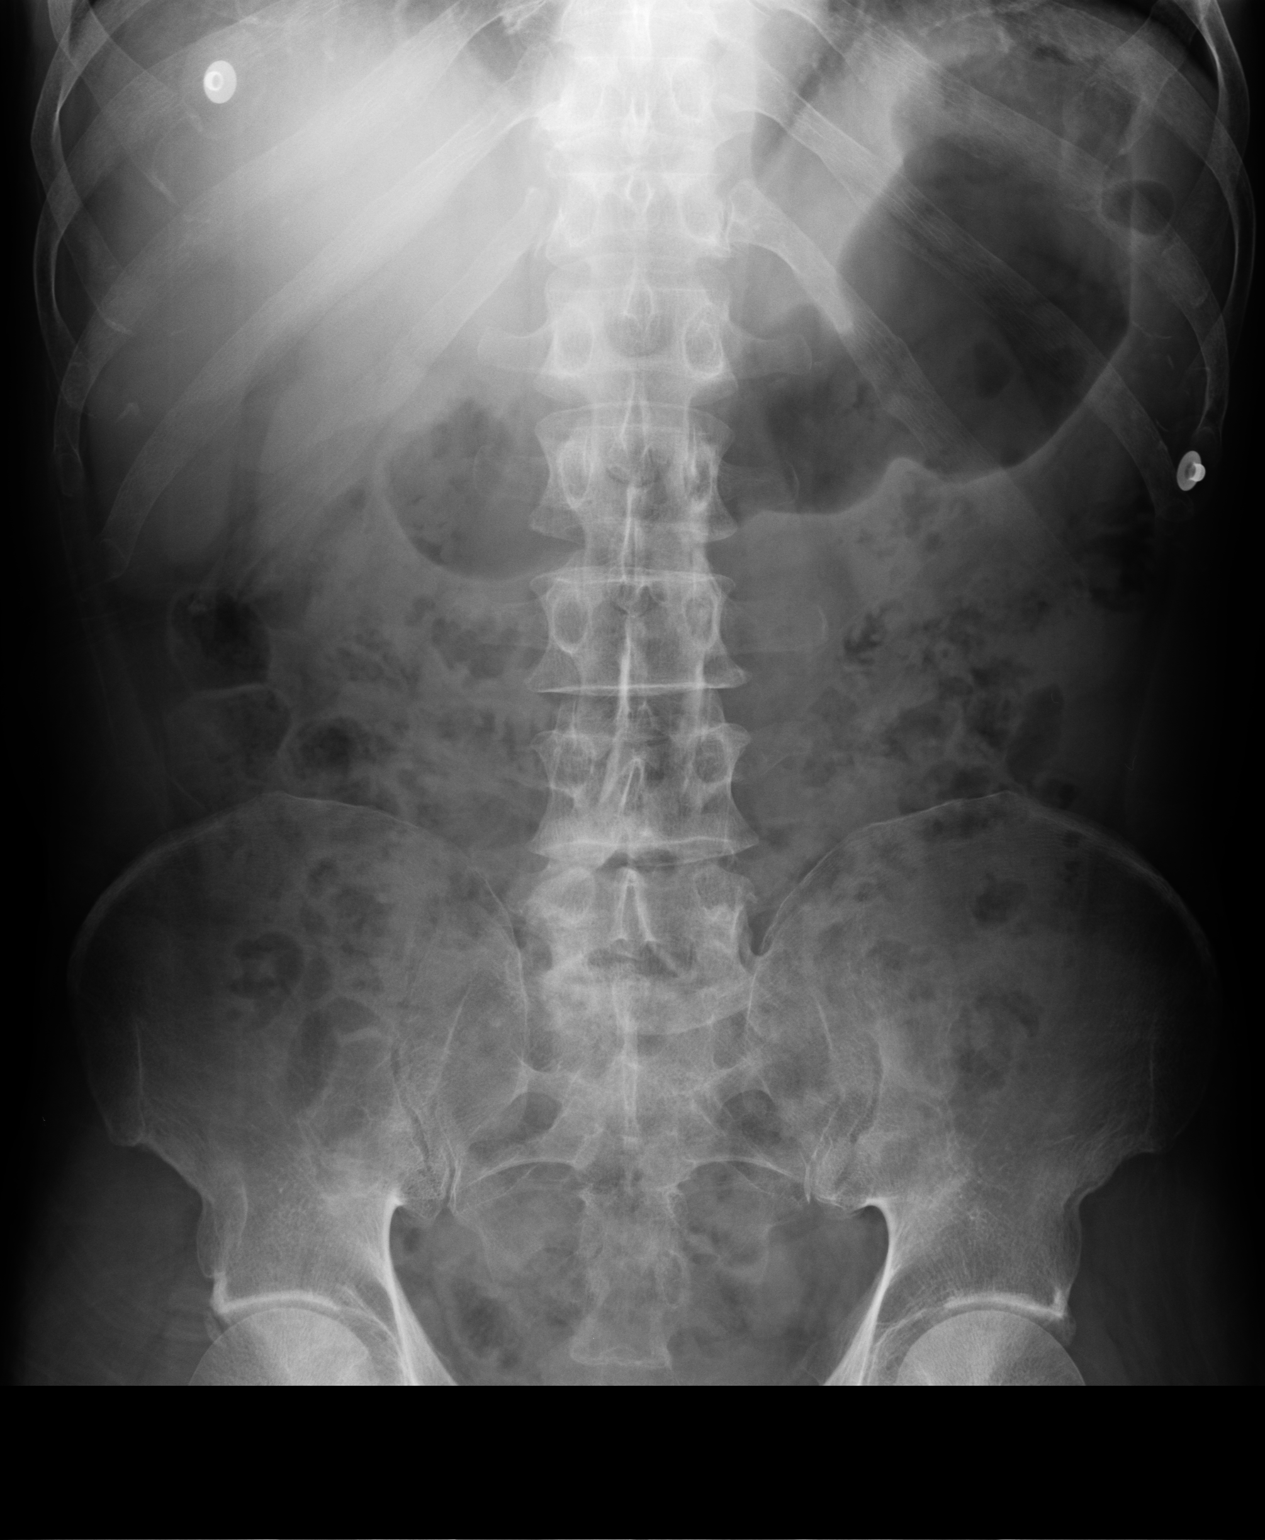

[1 of 1 positions shown; findings below may reference images not displayed]

PROCEDURE:     DXR - DXR KIDNEY URETER BLADDER  - [DATE]  [DATE]

RESULT:     A single view of the abdomen shows an unremarkable bowel gas
pattern with air in loops of small and large bowel as well as in the
stomach. The bony structures appear intact. There is no definite
nephrolithiasis seen. No ureterolithiasis is evident.
IMPRESSION: No evidence of bowel obstruction or perforation. There is
air within numerous loops of small bowel as well as in the colon.

## 2010-12-15 IMAGING — CR DG CHEST 2V
1 series · 3 of 3 positions shown · non-contrast
Comparison: none

REASON FOR EXAM: chest pain - intermittant/episodic
COMMENTS:

[Series 1: view not recorded · 0.17mm/px · 3 of 3 slices shown]
[im 1/3]
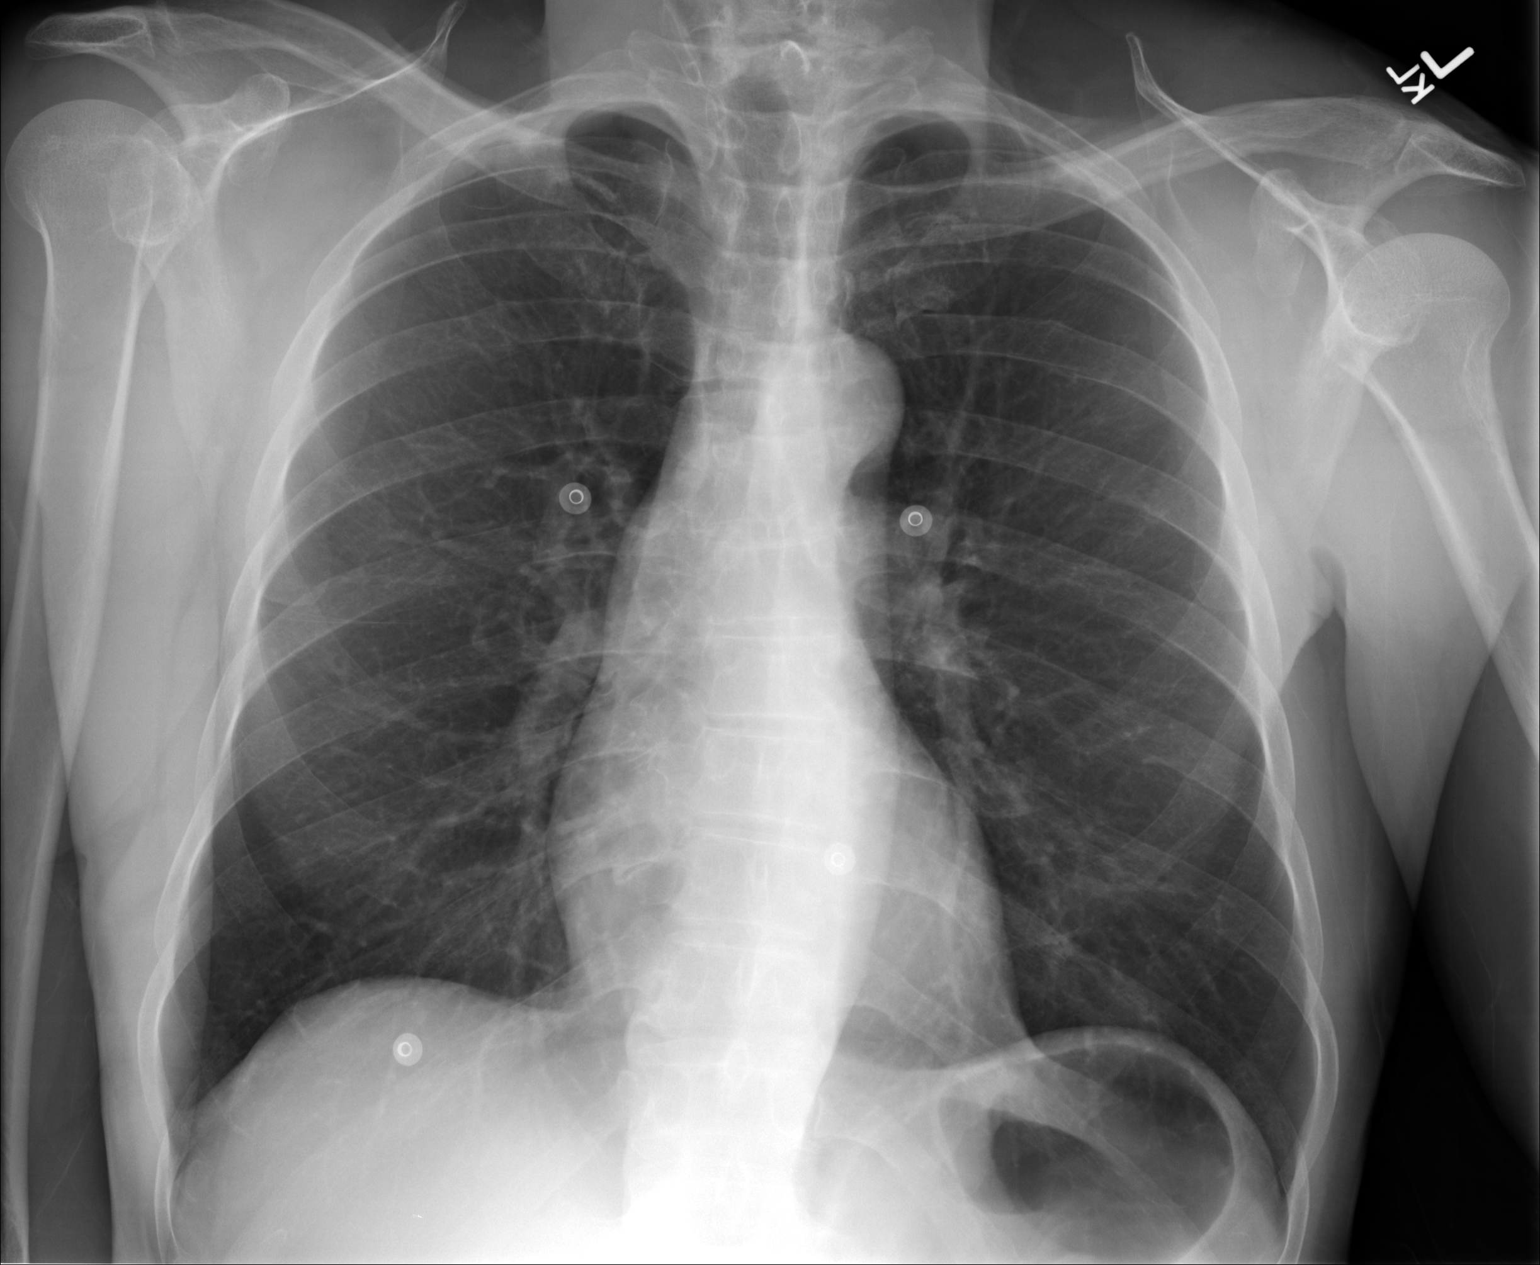
[im 2/3]
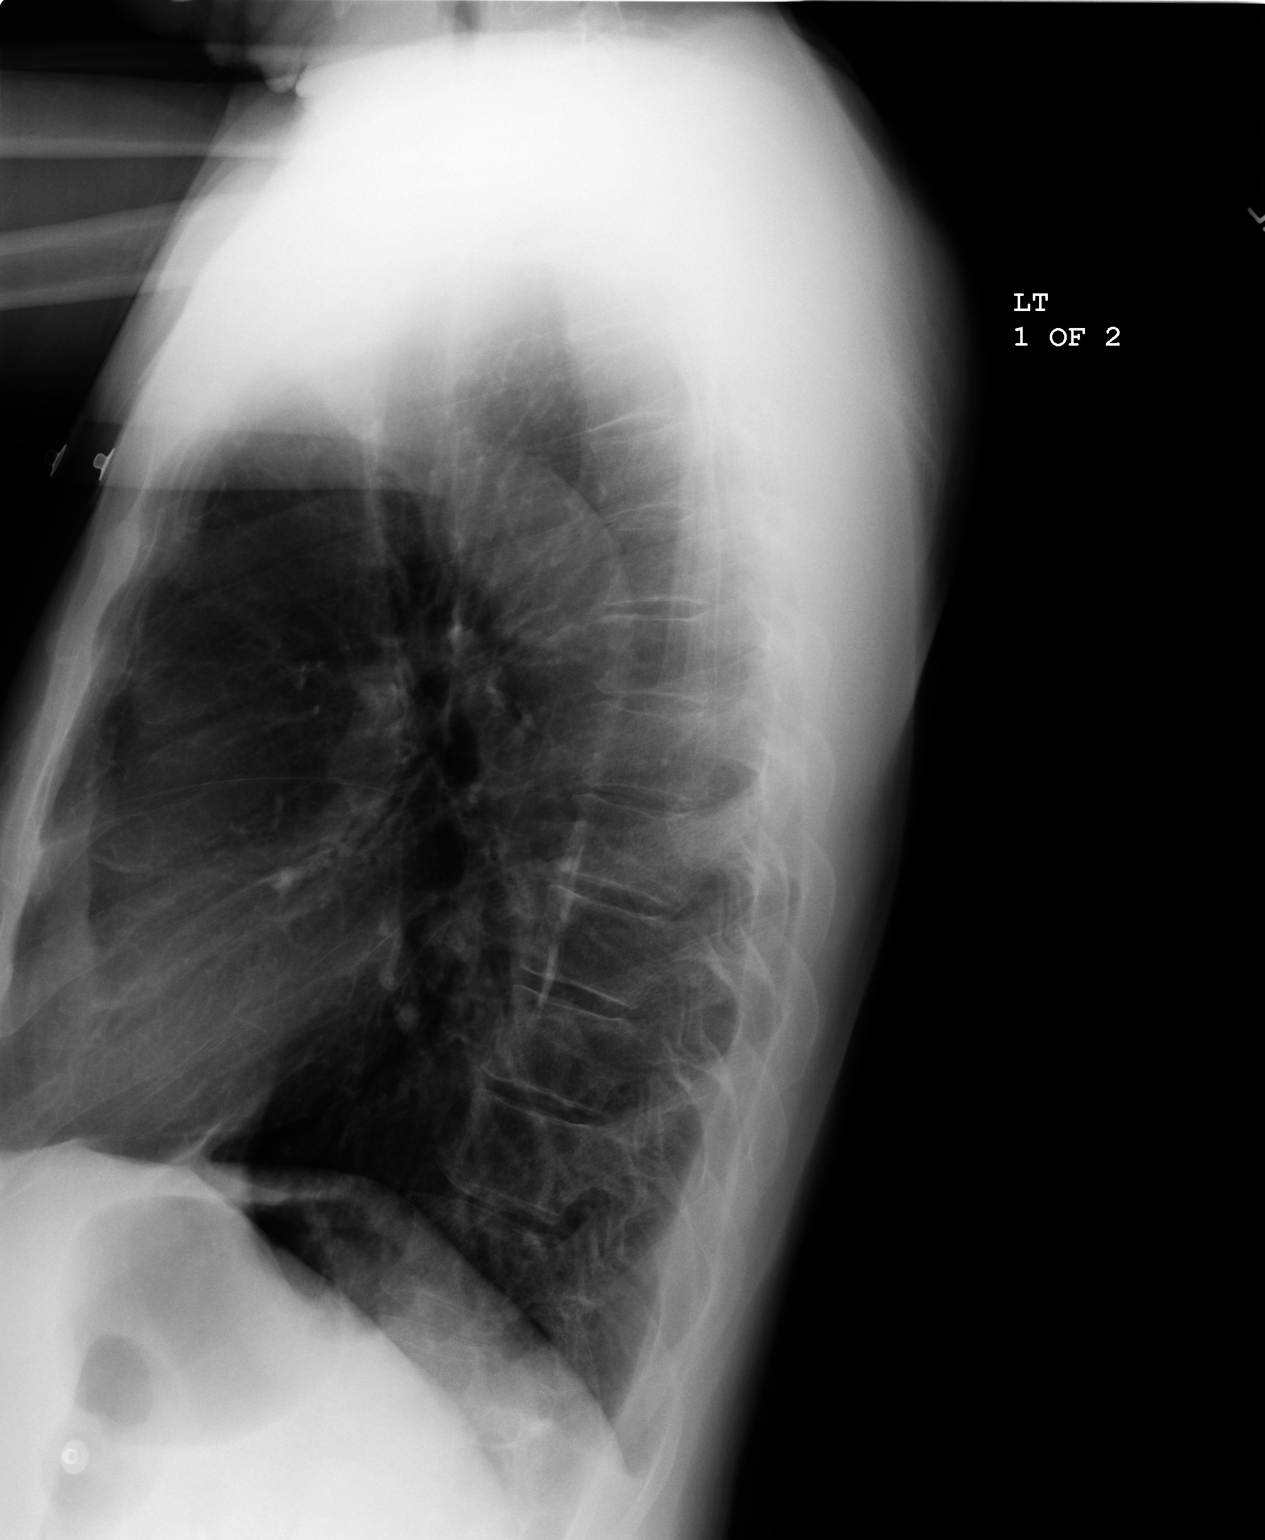
[im 3/3]
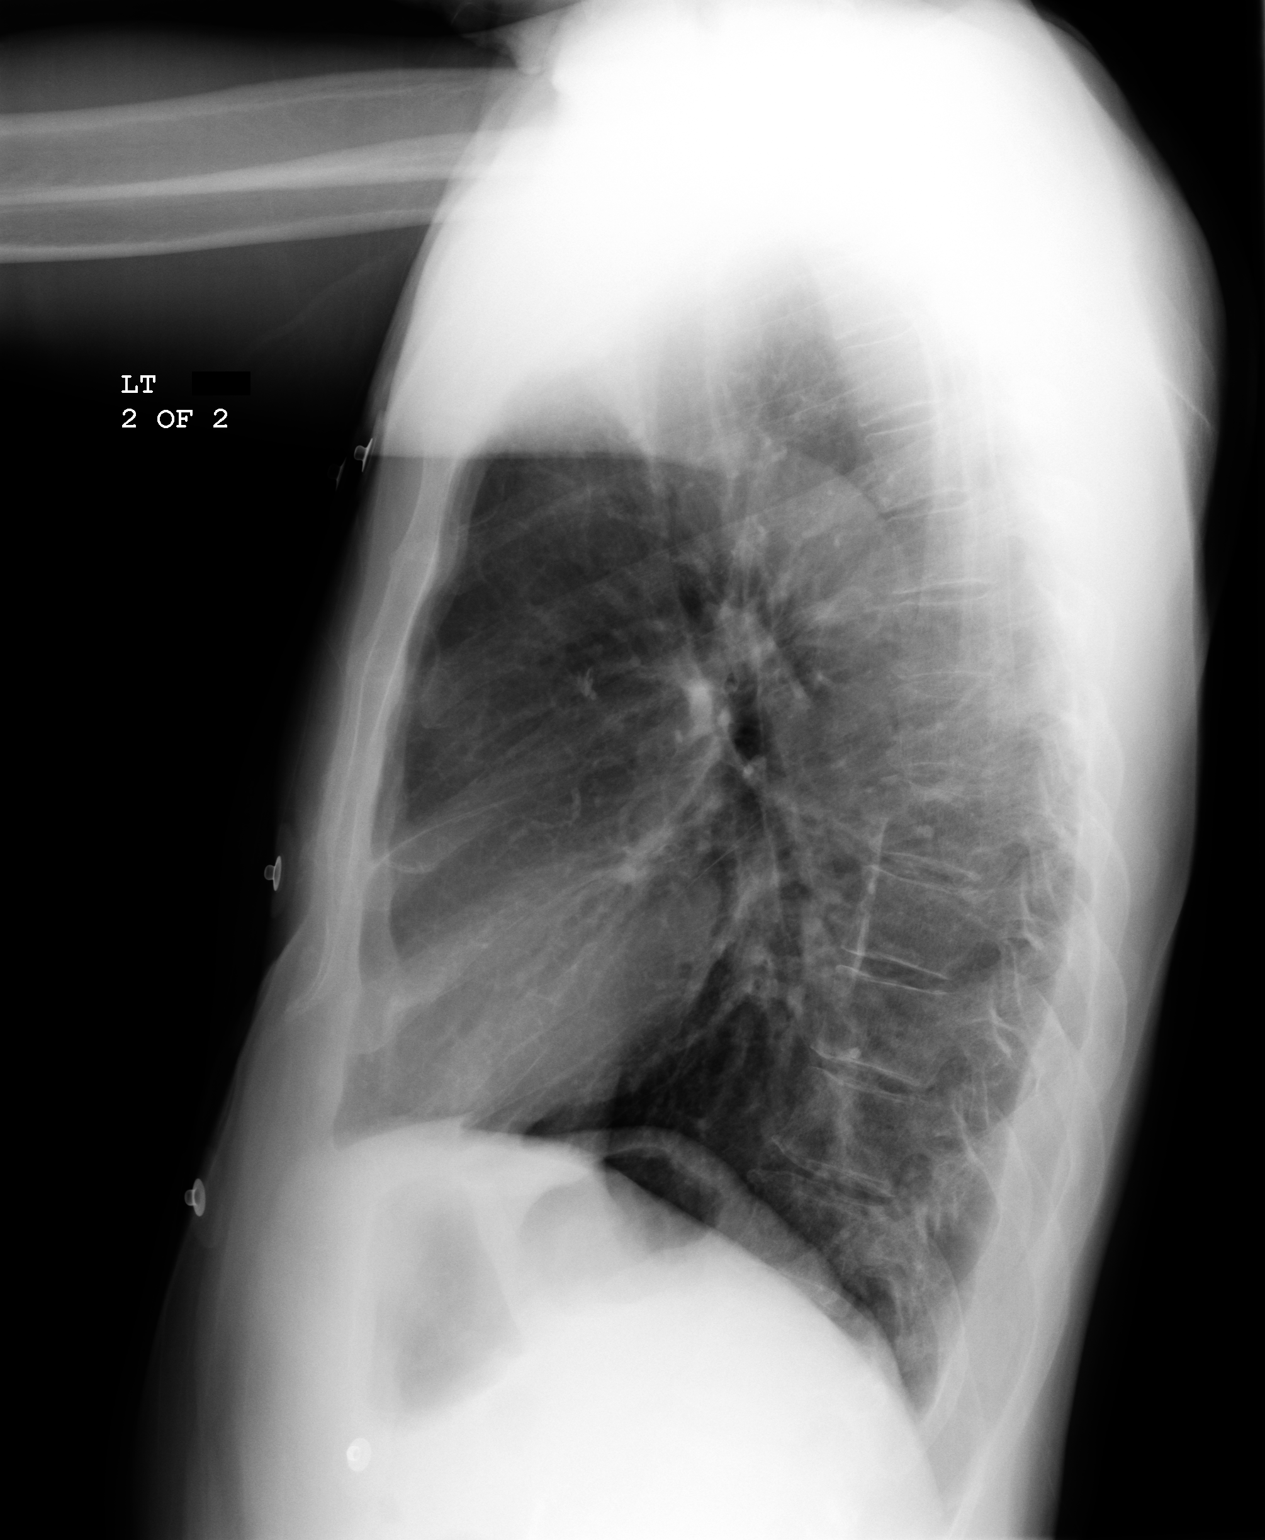

[3 of 3 positions shown; findings below may reference images not displayed]

PROCEDURE:     DXR - DXR CHEST PA (OR AP) AND LATERAL  - [DATE]  [DATE]

RESULT:     The lungs are hyperinflated consistent with COPD. The lungs are
clear. The heart and pulmonary vessels are normal. The bony and mediastinal
structures are unremarkable. There is no effusion. There is no pneumothorax
or evidence of congestive failure.
IMPRESSION: No acute cardiopulmonary disease.

## 2012-05-07 ENCOUNTER — Emergency Department: Payer: Self-pay | Admitting: Internal Medicine

## 2012-05-07 LAB — COMPREHENSIVE METABOLIC PANEL
Anion Gap: 9 (ref 7–16)
Bilirubin,Total: 2.6 mg/dL — ABNORMAL HIGH (ref 0.2–1.0)
Chloride: 104 mmol/L (ref 98–107)
Co2: 21 mmol/L (ref 21–32)
Creatinine: 0.71 mg/dL (ref 0.60–1.30)
EGFR (African American): >60
EGFR (Non-African Amer.): >60
Potassium: 4.4 mmol/L (ref 3.5–5.1)
SGOT(AST): 53 U/L — ABNORMAL HIGH (ref 15–37)
SGPT (ALT): 39 U/L
Total Protein: 8.5 g/dL — ABNORMAL HIGH (ref 6.4–8.2)

## 2012-05-07 LAB — CBC
HCT: 38.1 % — ABNORMAL LOW (ref 40.0–52.0)
HGB: 13.2 g/dL (ref 13.0–18.0)
MCH: 35.9 pg — ABNORMAL HIGH (ref 26.0–34.0)
MCV: 103 fL — ABNORMAL HIGH (ref 80–100)
RBC: 3.69 10*6/uL — ABNORMAL LOW (ref 4.40–5.90)
RDW: 13.4 % (ref 11.5–14.5)
WBC: 6.4 10*3/uL (ref 3.8–10.6)

## 2012-05-07 IMAGING — CR DG CHEST 2V
1 series · 2 of 2 positions shown · non-contrast
Comparison: none

REASON FOR EXAM: fall
COMMENTS:

PROCEDURE:     DXR - DXR CHEST PA (OR AP) AND LATERAL  - [DATE]  [DATE]
RESULT:     Comparison: [DATE] and [DATE]

[Series 9: w chest pa · 0.14mm/px · 2 of 2 slices shown]
[im 1/2]
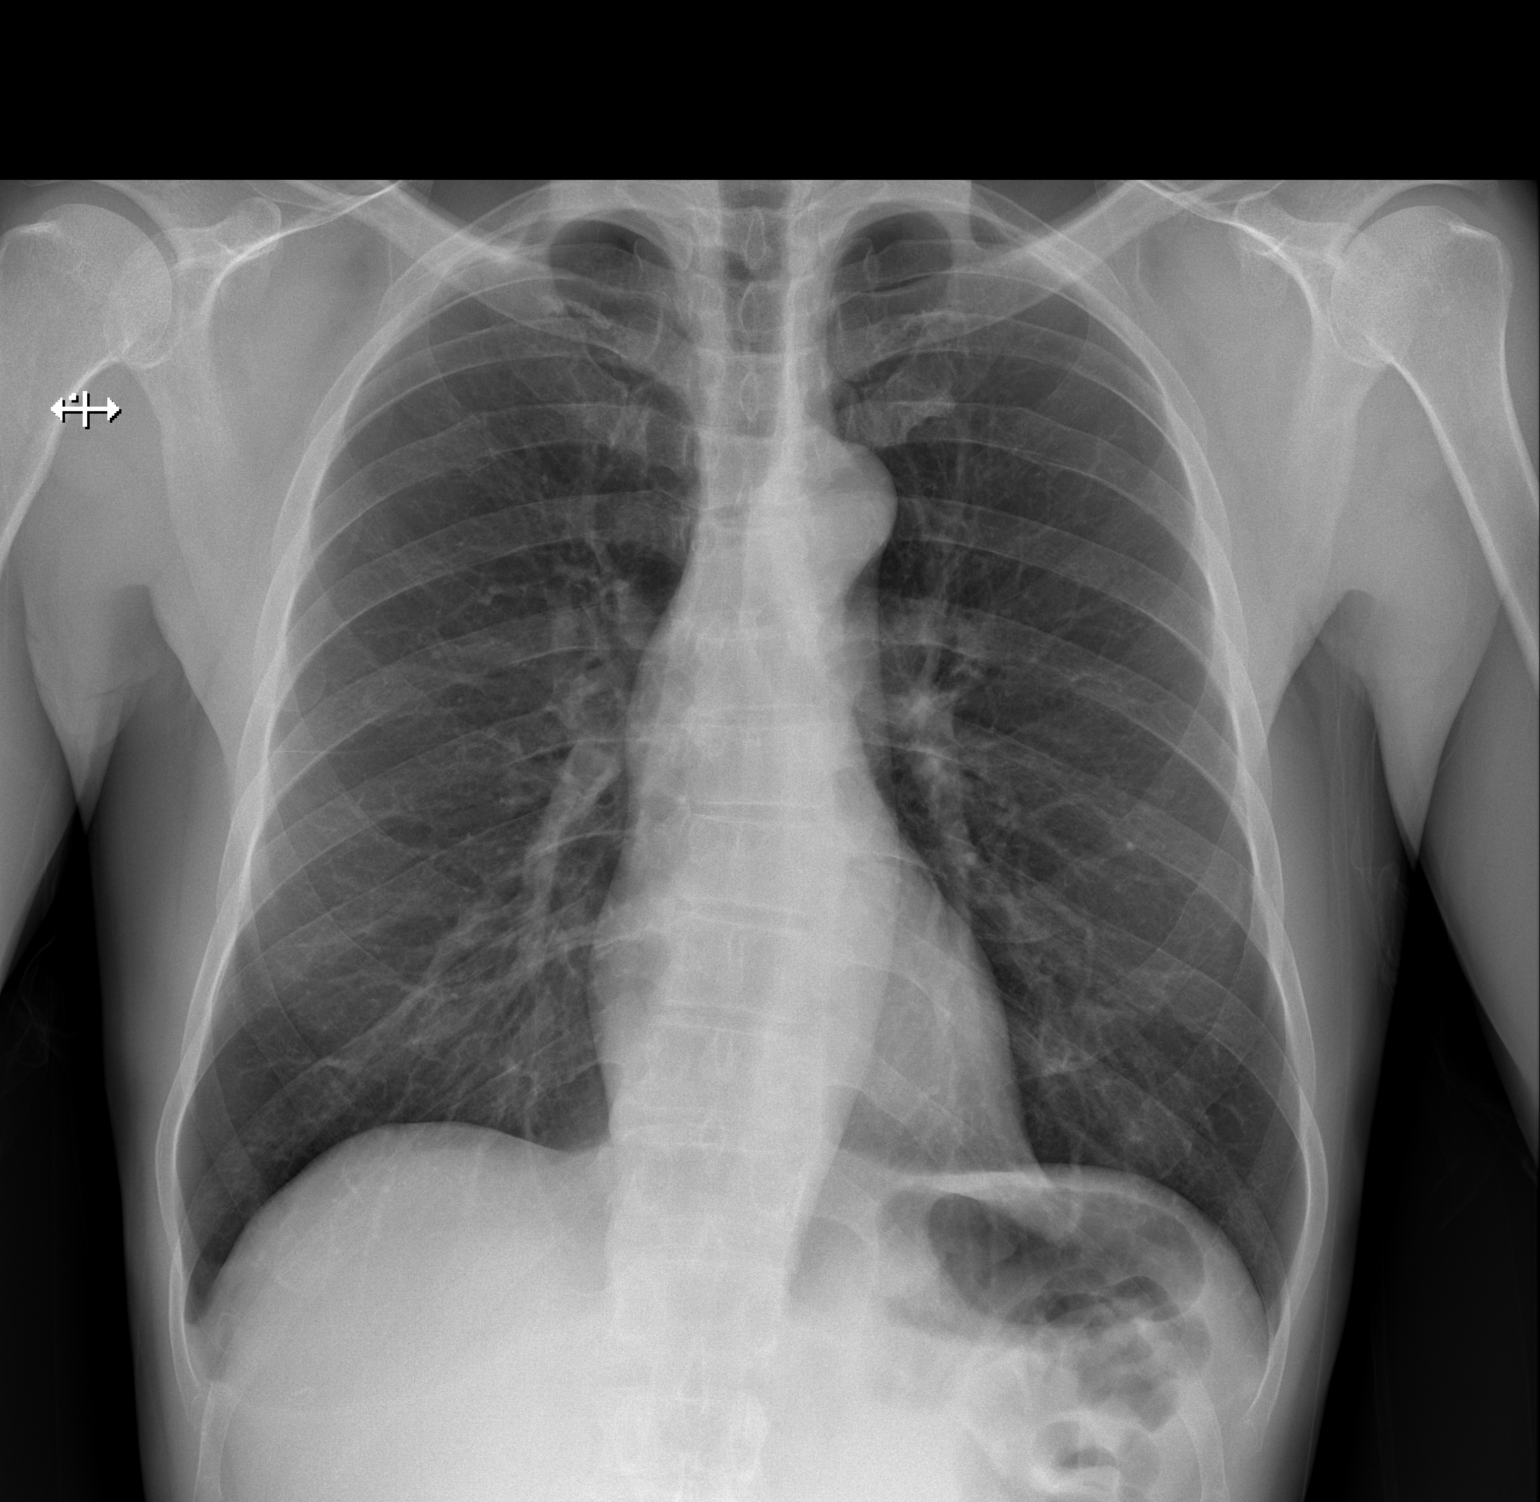
[im 2/2]
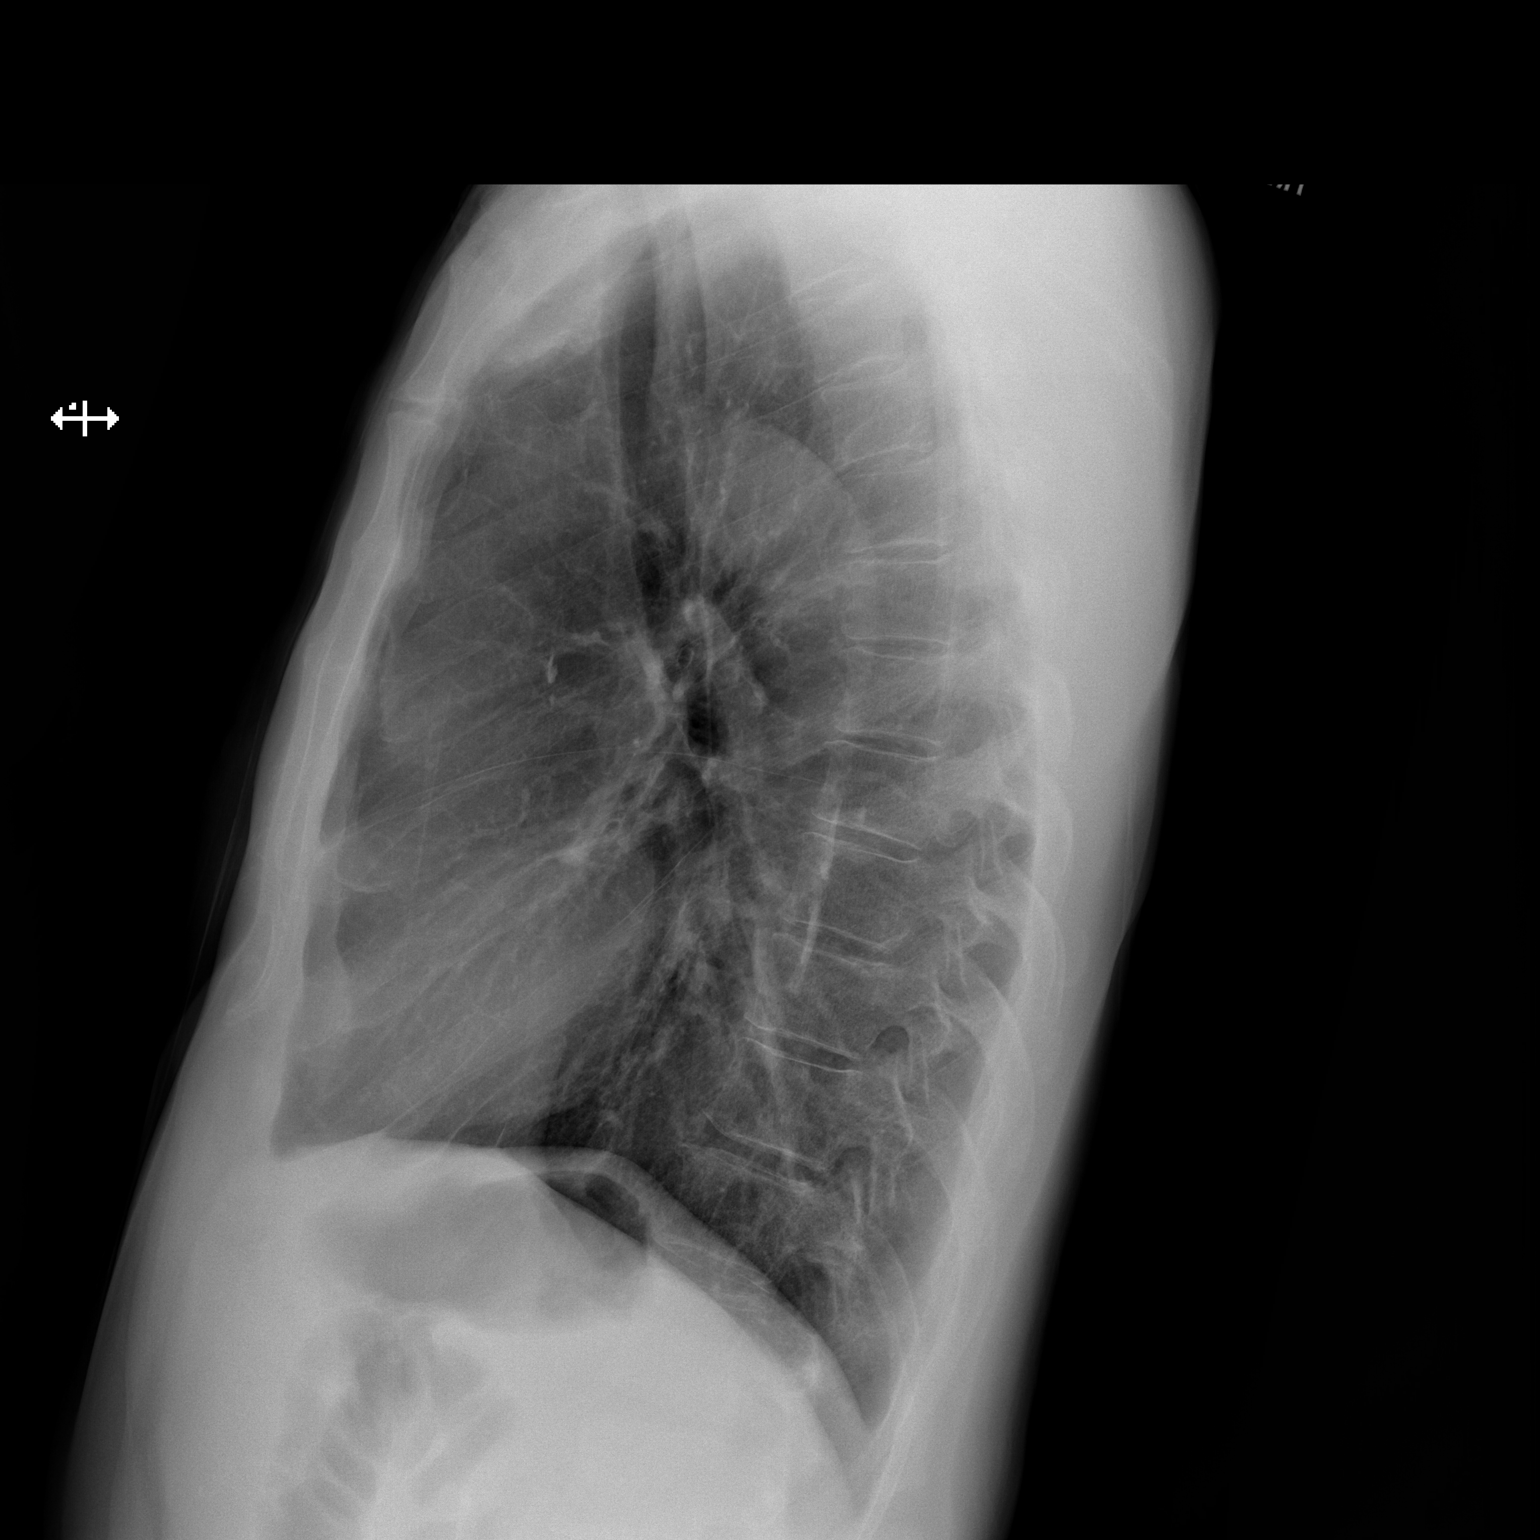

[2 of 2 positions shown; findings below may reference images not displayed]

FINDINGS: The heart and mediastinum are stable. Small linear density overlying the
right lung apex is similar to prior and may represent an area of scarring.
Mild right apical pleural-parenchymal thickening is also similar to prior
studies. Otherwise, no focal pulmonary opacities. No pneumothorax.
IMPRESSION: No acute cardiopulmonary disease.

[REDACTED]

## 2012-05-07 IMAGING — CR LEFT WRIST - COMPLETE 3+ VIEW
1 series · 4 of 4 positions shown · non-contrast
Comparison: none

REASON FOR EXAM: pain after fall - ed waiting room
COMMENTS:   May transport without cardiac monitor

PROCEDURE:     DXR - DXR WRIST LT COMP WITH OBLIQUES  - [DATE]  [DATE]
RESULT:     Comparison: None.

[Series 1: x wrist pa left · 0.14mm/px · 4 of 4 slices shown]
[im 1/4]
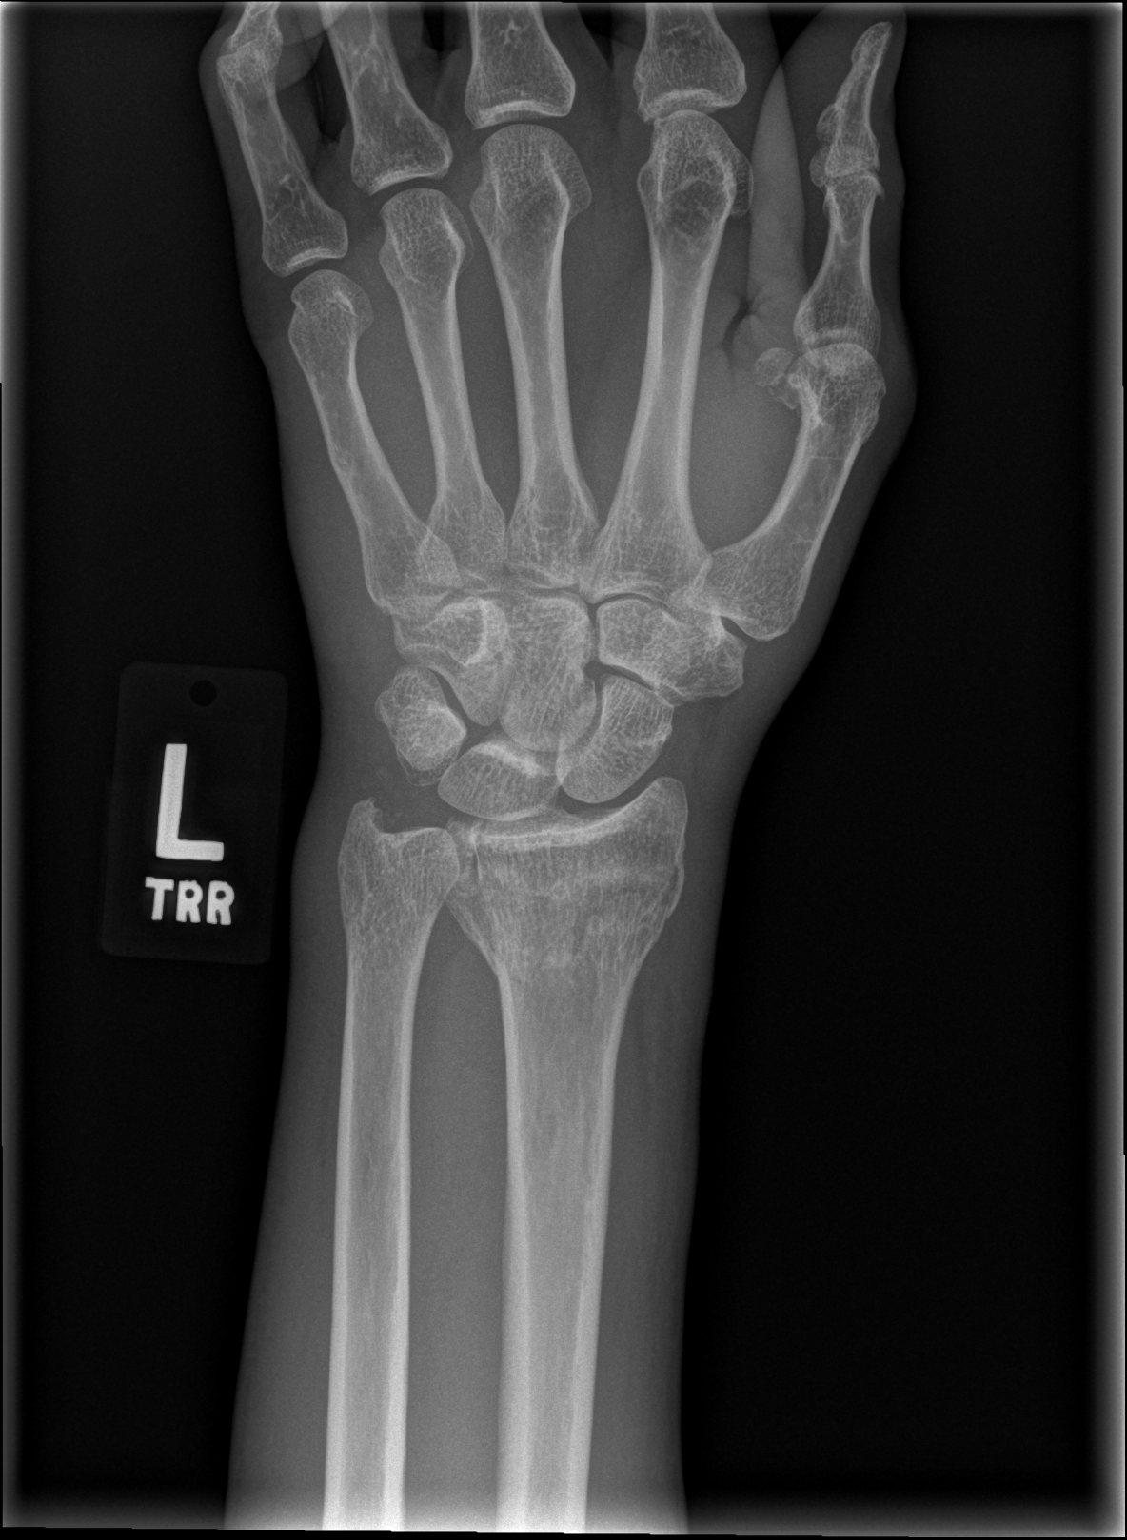
[im 2/4]
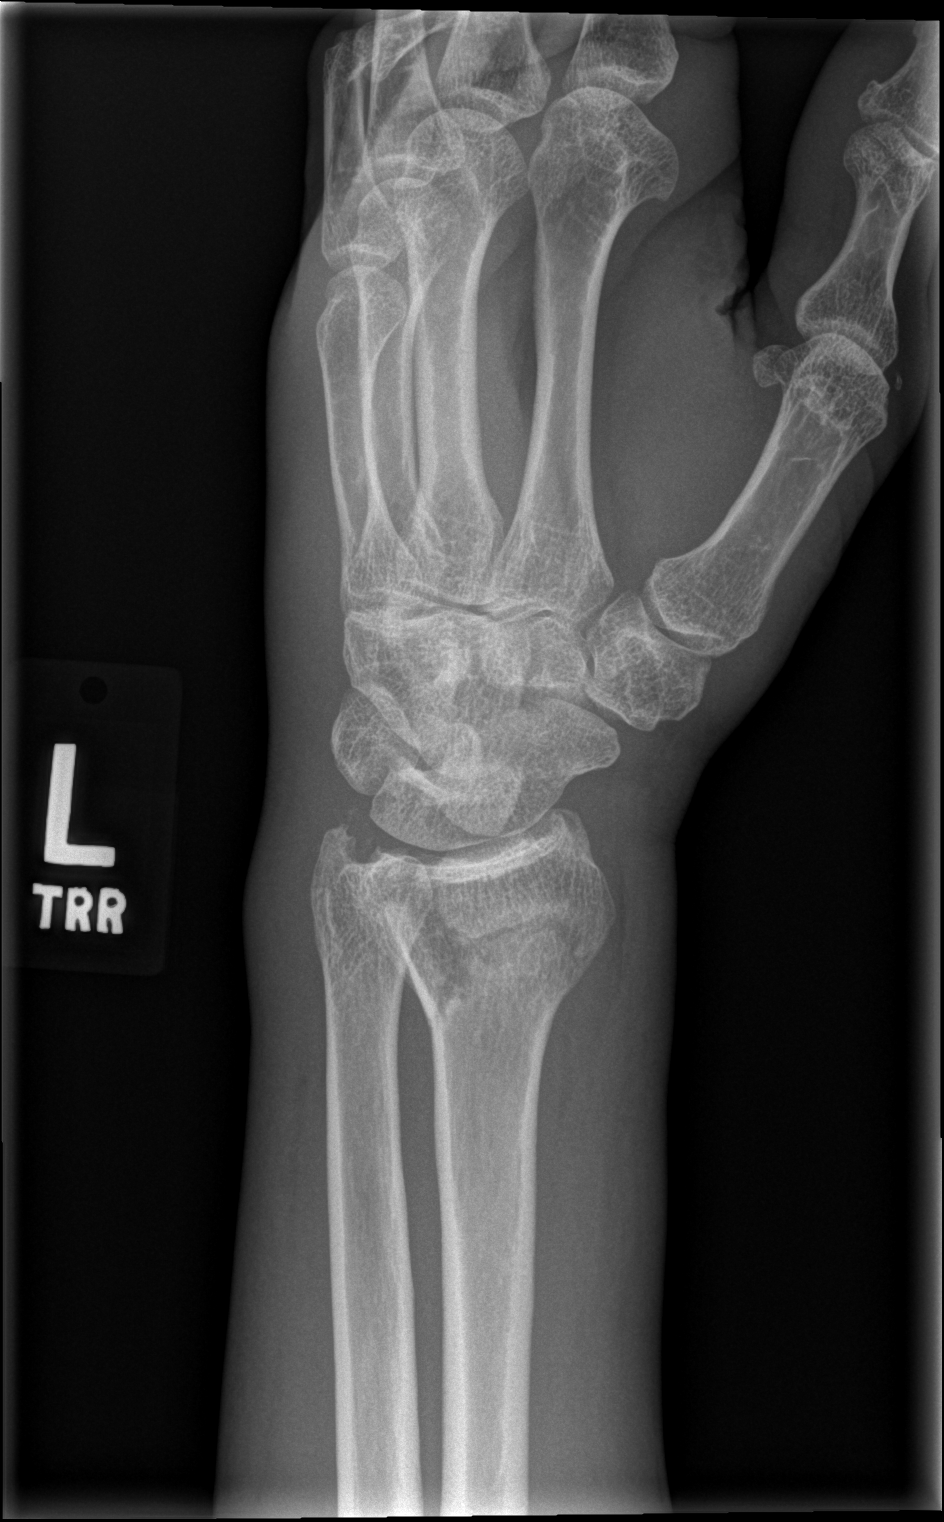
[im 3/4]
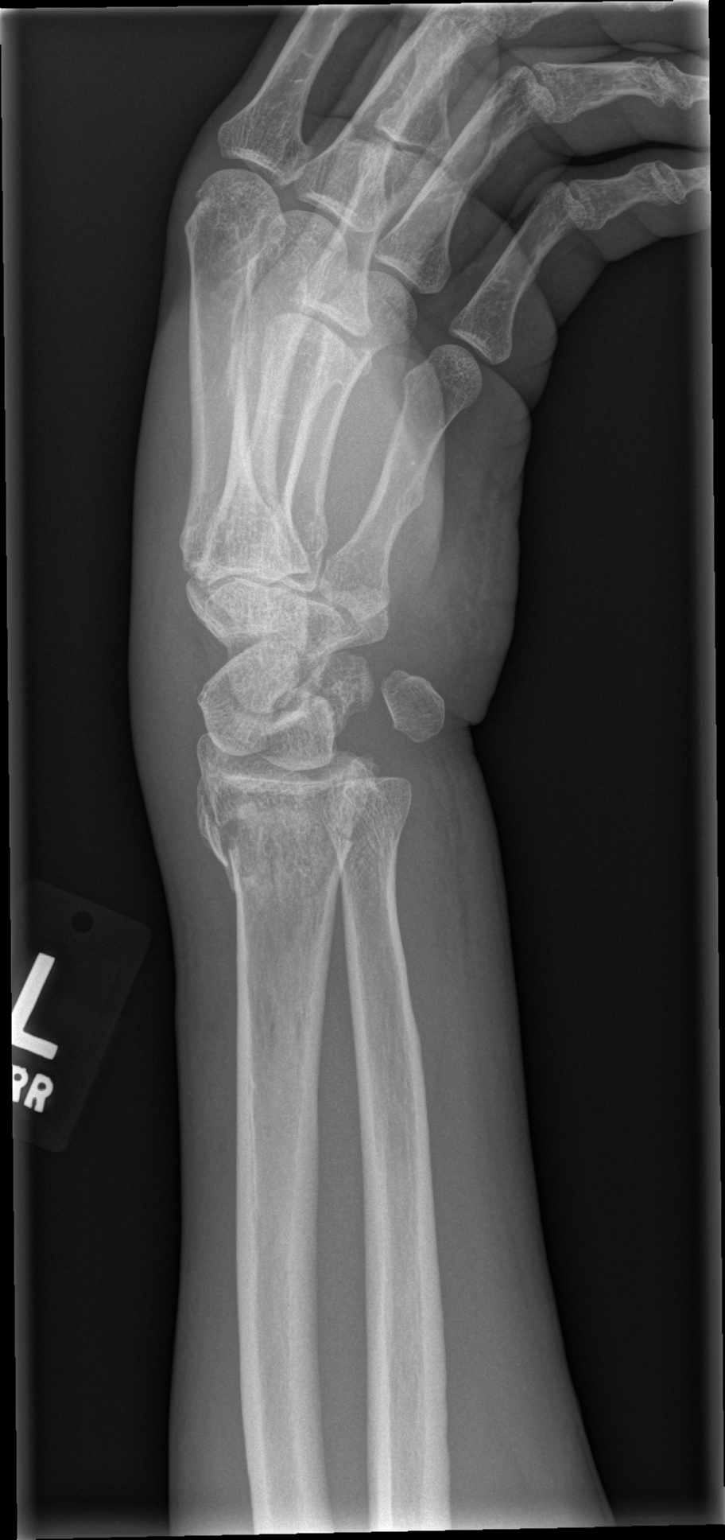
[im 4/4]
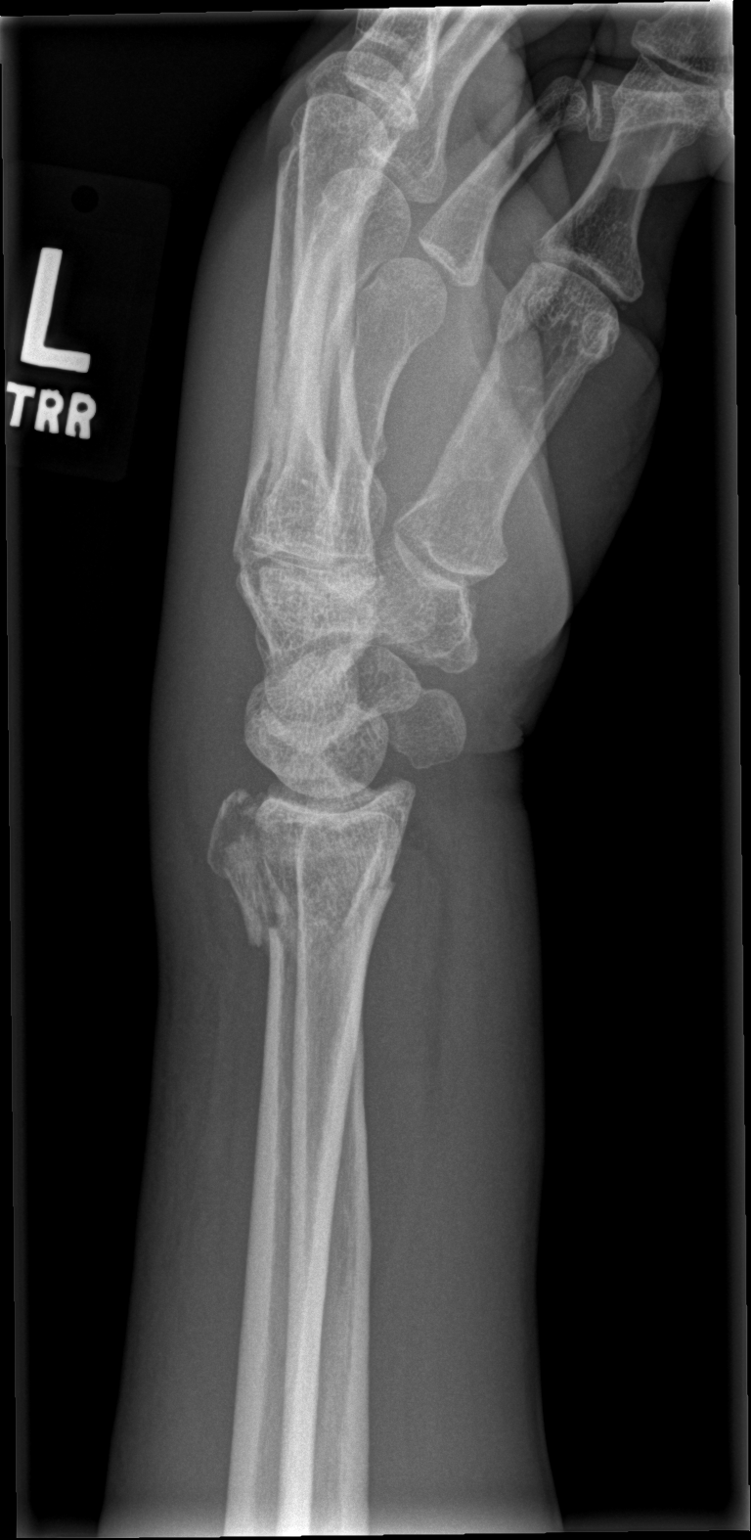

[4 of 4 positions shown; findings below may reference images not displayed]

FINDINGS: There is a mildly impacted and mildly displaced fracture of the distal
radius. The fracture line involves the ulnar side of the radiocarpal
articular surface. Subtle densities distal to the ulnar styloid represent
mineralization within the TFC complex, which can be seen with CPPD
arthropathy.
IMPRESSION: Distal radius fracture.

[REDACTED]

## 2012-05-07 IMAGING — CR DG WRIST COMPLETE 3+V*R*
1 series · 4 of 4 positions shown · non-contrast
Comparison: none

REASON FOR EXAM: pain after fall  - ed waiting room
COMMENTS:   May transport without cardiac monitor

PROCEDURE:     DXR - DXR WRIST RT COMP WITH OBLIQUES  - [DATE]  [DATE]
RESULT:     Comparison: None.

[Series 1: x wrist pa right · 0.14mm/px · 4 of 4 slices shown]
[im 1/4]
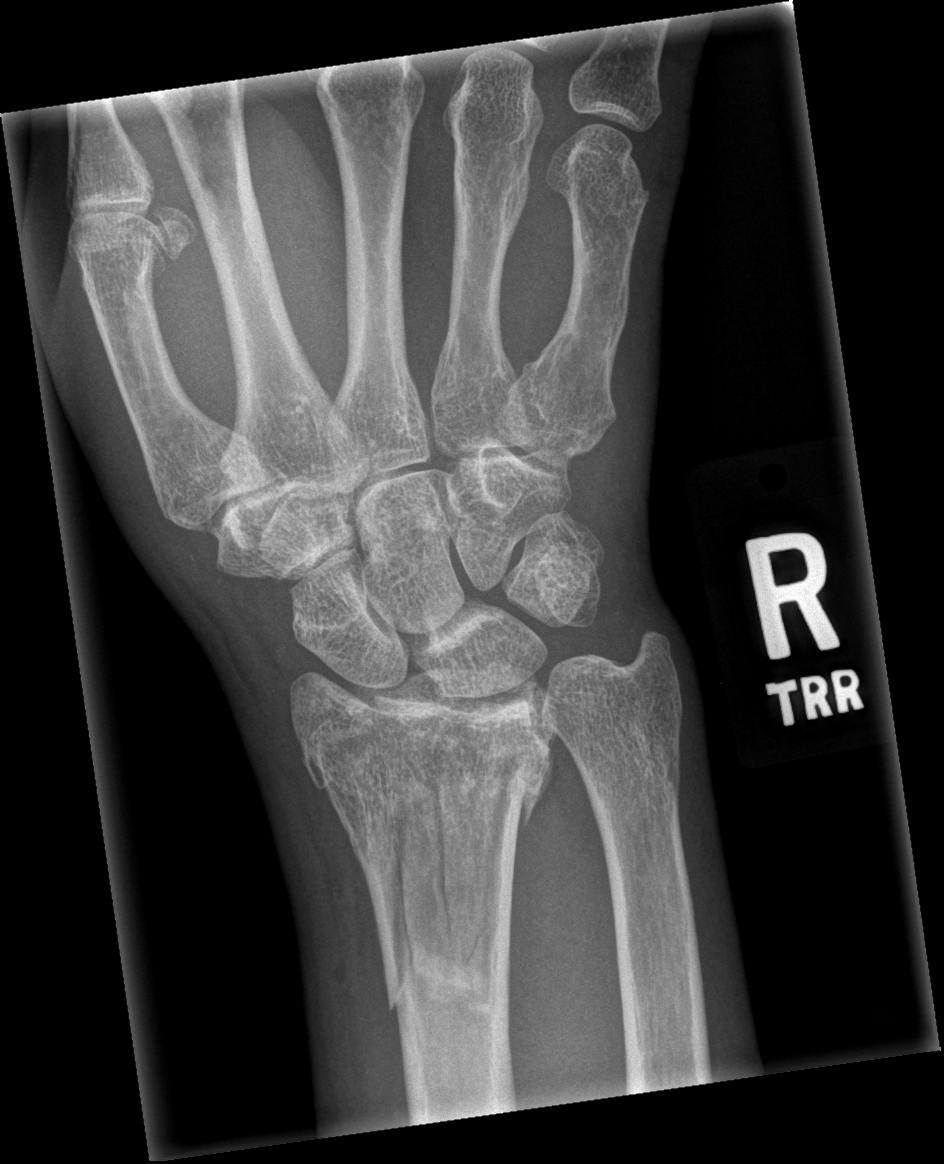
[im 2/4]
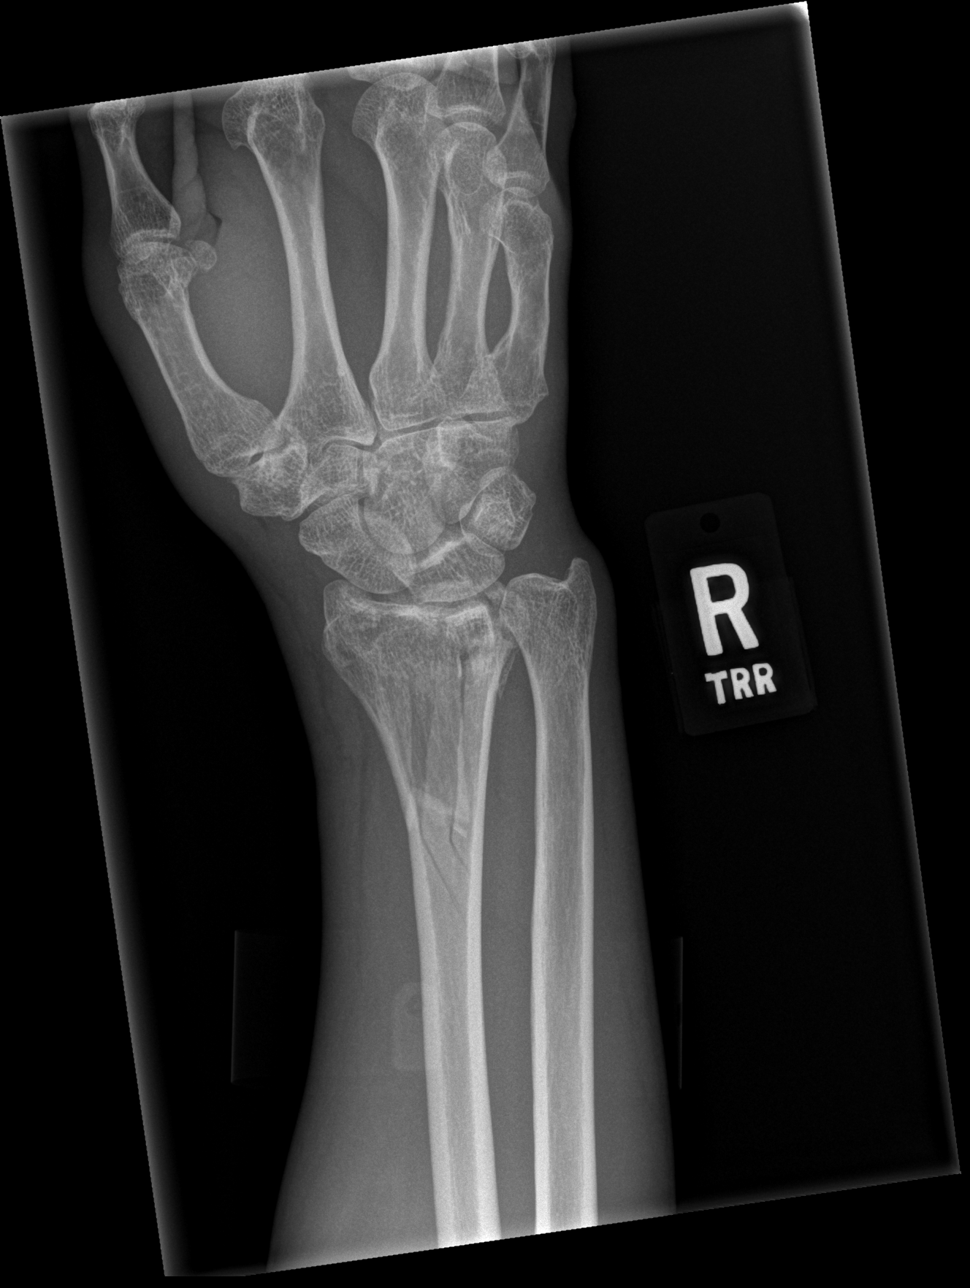
[im 3/4]
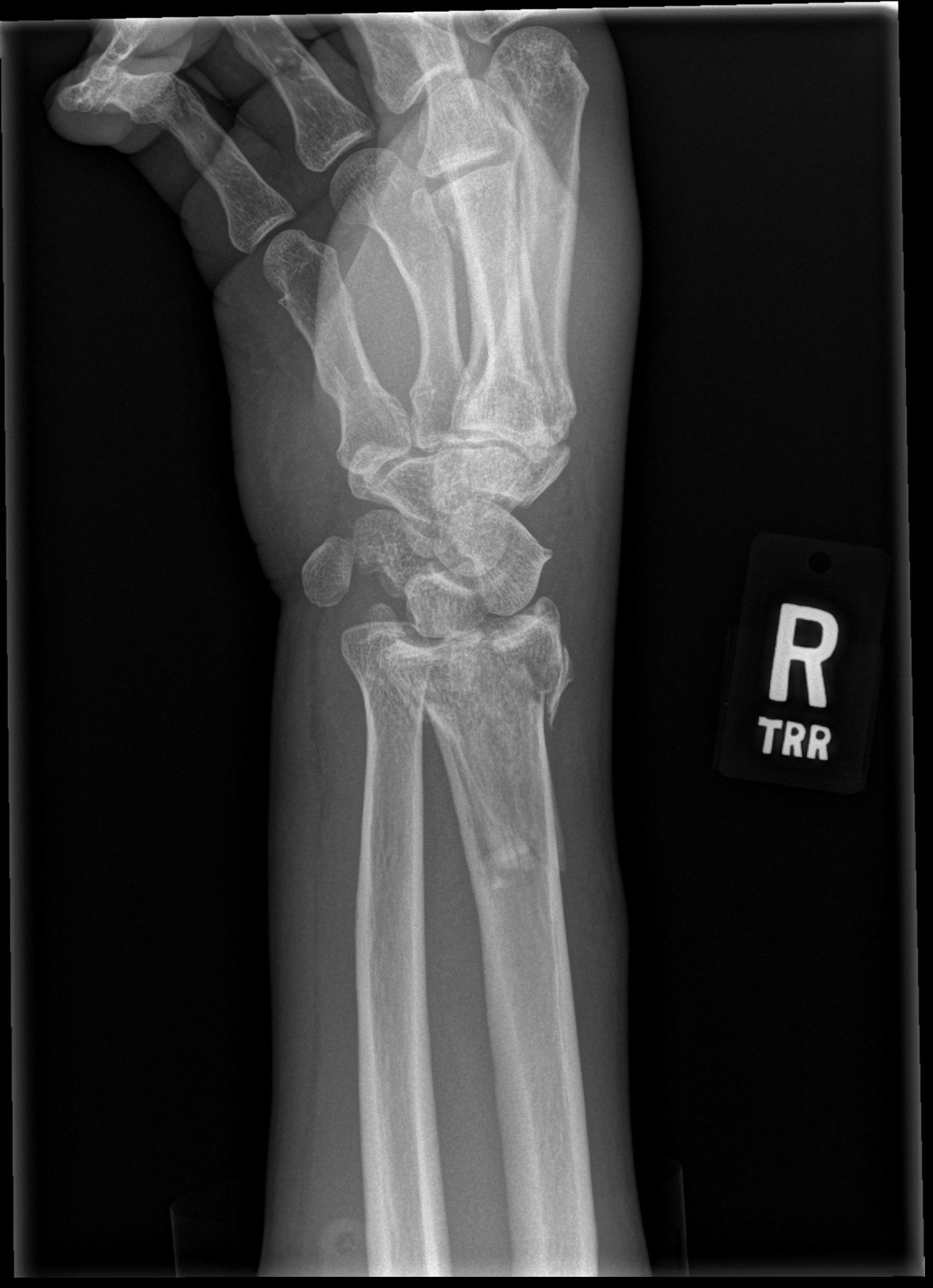
[im 4/4]
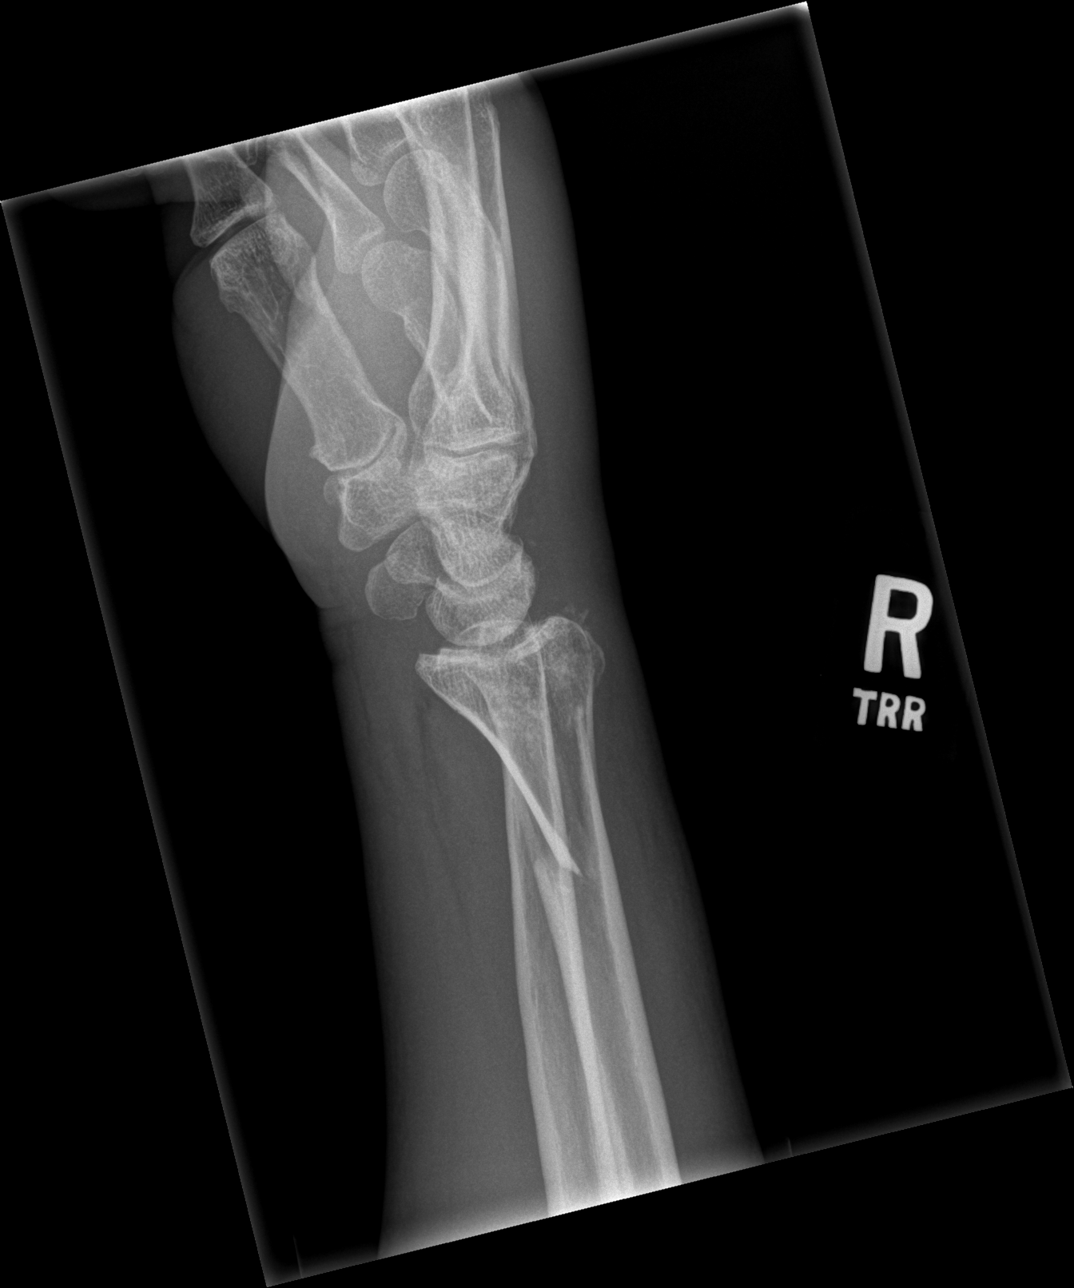

[4 of 4 positions shown; findings below may reference images not displayed]

FINDINGS: There is a displaced, comminuted fracture of the distal radius. The fracture
lines involve the radiocarpal articular surface. Mild irregularity of the
fifth metacarpal is likely secondary to old prior trauma.
IMPRESSION: Distal radius fracture.

[REDACTED]

## 2014-11-09 HISTORY — PX: WRIST FRACTURE SURGERY: SHX121

## 2018-10-28 ENCOUNTER — Emergency Department: Payer: Self-pay

## 2018-10-28 ENCOUNTER — Encounter: Payer: Self-pay | Admitting: Emergency Medicine

## 2018-10-28 ENCOUNTER — Other Ambulatory Visit: Payer: Self-pay

## 2018-10-28 ENCOUNTER — Emergency Department
Admission: EM | Admit: 2018-10-28 | Discharge: 2018-10-28 | Disposition: A | Discharge status: Home / Self Care | Payer: Self-pay | Attending: Emergency Medicine | Admitting: Emergency Medicine

## 2018-10-28 DIAGNOSIS — R1011 Right upper quadrant pain: Secondary | ICD-10-CM | POA: Insufficient documentation

## 2018-10-28 LAB — COMPREHENSIVE METABOLIC PANEL
ALBUMIN: 4.4 g/dL (ref 3.5–5.0)
ALK PHOS: 86 U/L (ref 38–126)
ALT: 60 U/L — ABNORMAL HIGH (ref 0–44)
AST: 165 U/L — ABNORMAL HIGH (ref 15–41)
Anion gap: 17 — ABNORMAL HIGH (ref 5–15)
BILIRUBIN TOTAL: 3.2 mg/dL — AB (ref 0.3–1.2)
BUN: 13 mg/dL (ref 8–23)
CO2: 19 mmol/L — ABNORMAL LOW (ref 22–32)
Calcium: 9.1 mg/dL (ref 8.9–10.3)
Chloride: 99 mmol/L (ref 98–111)
Creatinine, Ser: 0.53 mg/dL — ABNORMAL LOW (ref 0.61–1.24)
GFR calc Af Amer: >60 mL/min (ref >60)
GFR calc non Af Amer: >60 mL/min (ref >60)
GLUCOSE: 79 mg/dL (ref 70–99)
Potassium: 3.8 mmol/L (ref 3.5–5.1)
Sodium: 135 mmol/L (ref 135–145)
Total Protein: 7.9 g/dL (ref 6.5–8.1)

## 2018-10-28 LAB — CBC
HCT: 38.7 % — ABNORMAL LOW (ref 39.0–52.0)
Hemoglobin: 13.5 g/dL (ref 13.0–17.0)
MCH: 35.2 pg — AB (ref 26.0–34.0)
MCHC: 34.9 g/dL (ref 30.0–36.0)
MCV: 100.8 fL — ABNORMAL HIGH (ref 80.0–100.0)
PLATELETS: 82 10*3/uL — AB (ref 150–400)
RBC: 3.84 MIL/uL — ABNORMAL LOW (ref 4.22–5.81)
RDW: 12.5 % (ref 11.5–15.5)
WBC: 3.7 10*3/uL — ABNORMAL LOW (ref 4.0–10.5)
nRBC: 0 % (ref 0.0–0.2)

## 2018-10-28 LAB — URINALYSIS, COMPLETE (UACMP) WITH MICROSCOPIC
Bacteria, UA: NONE SEEN
Bilirubin Urine: NEGATIVE
Glucose, UA: NEGATIVE mg/dL
Ketones, ur: 80 mg/dL — AB
Nitrite: NEGATIVE
PROTEIN: NEGATIVE mg/dL
Specific Gravity, Urine: 1.024 (ref 1.005–1.030)
pH: 5 (ref 5.0–8.0)

## 2018-10-28 LAB — LIPASE, BLOOD: Lipase: 21 U/L (ref 11–51)

## 2018-10-28 IMAGING — US US ABDOMEN LIMITED
1 series · 14 of 25 positions shown · non-contrast
Comparison: None.

CLINICAL DATA: Right upper quadrant abdominal pain.

EXAM:
ULTRASOUND ABDOMEN LIMITED RIGHT UPPER QUADRANT

[Series 1: us abdomen limited · 14 of 36 slices shown]
[im 1/36]
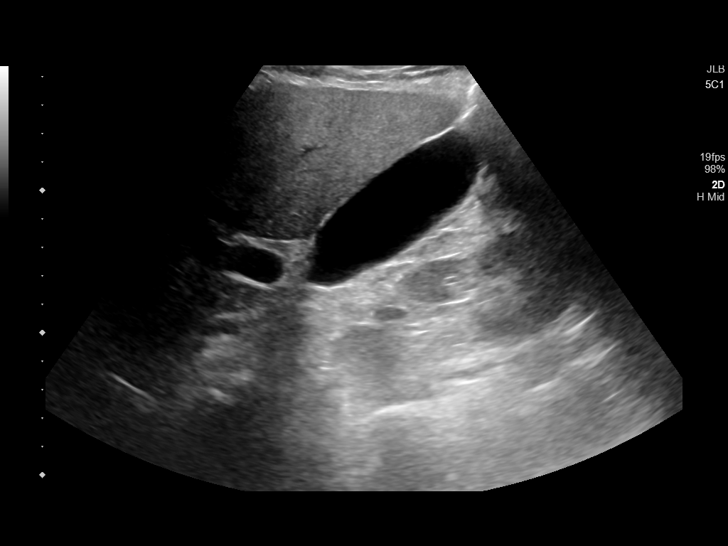
[im 3/36]
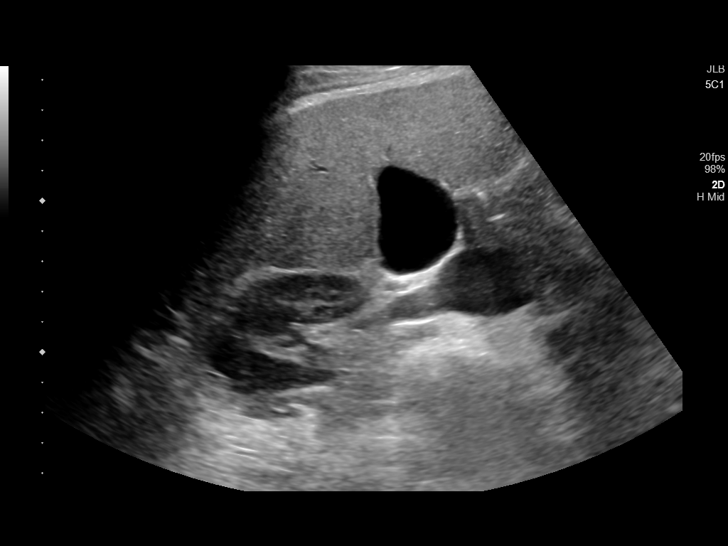
[im 6/36]
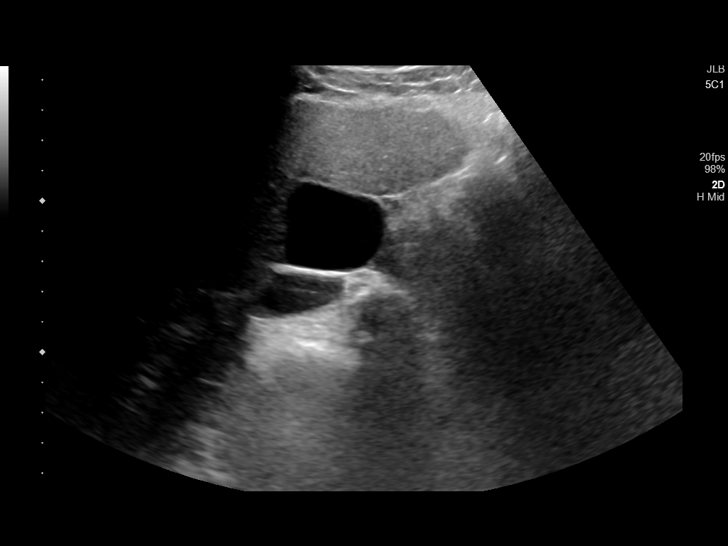
[im 9/36]
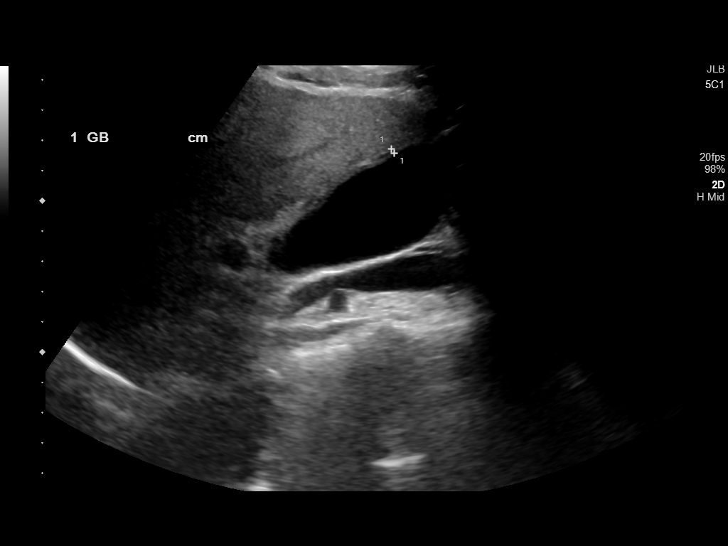
[im 12/36]
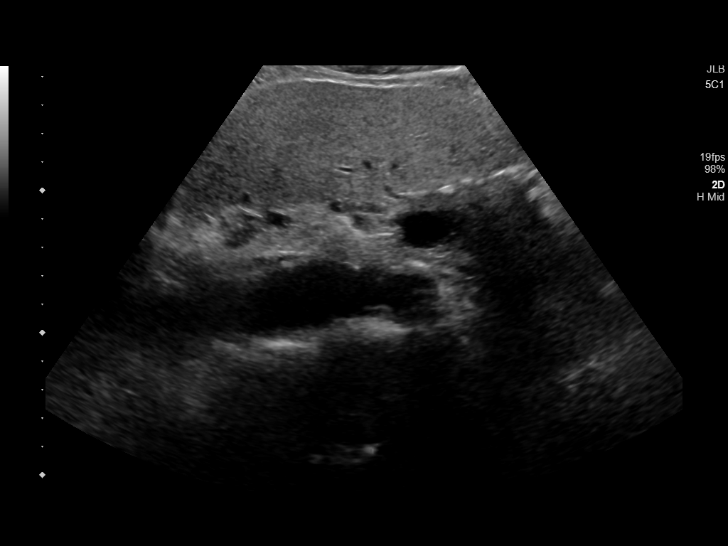
[im 14/36]
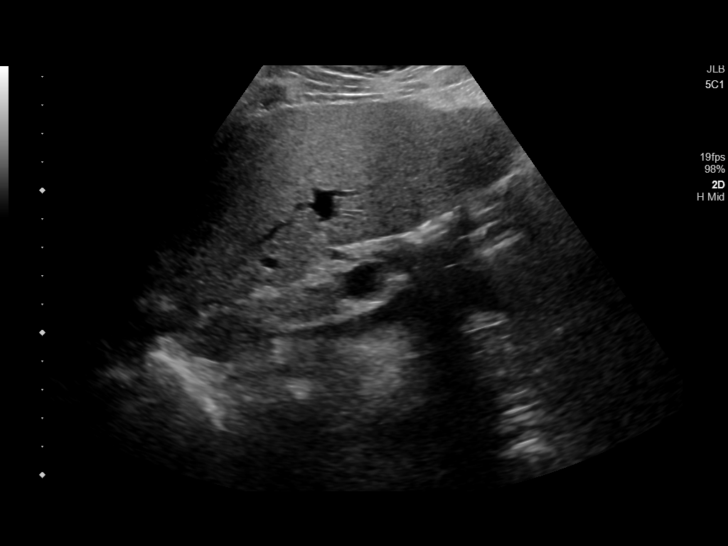
[im 17/36]
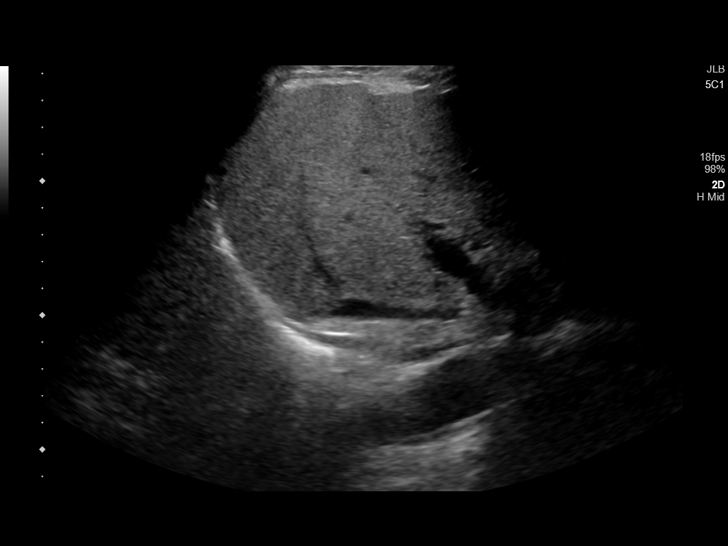
[im 19/36]
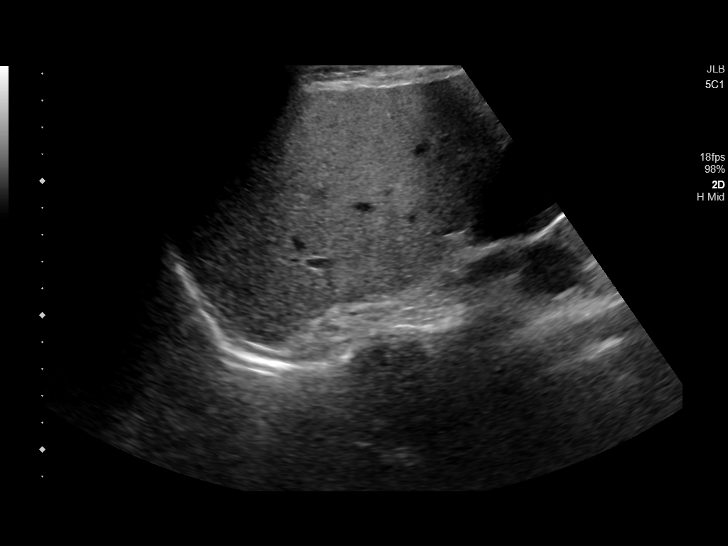
[im 22/36]
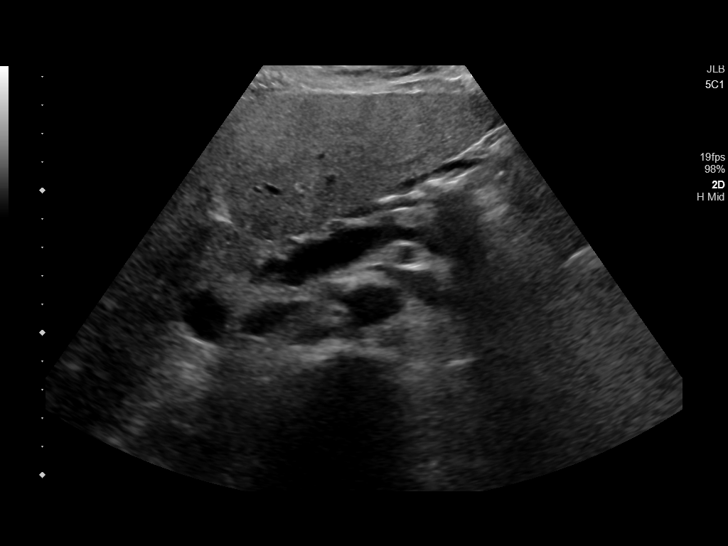
[im 24/36]
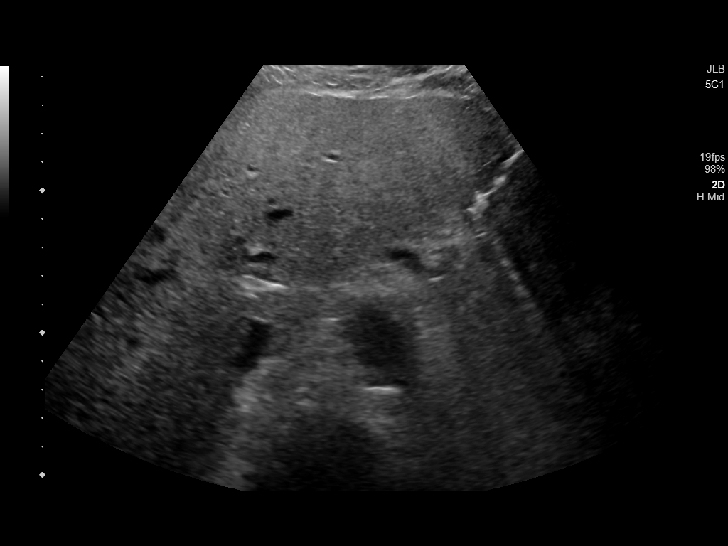
[im 27/36]
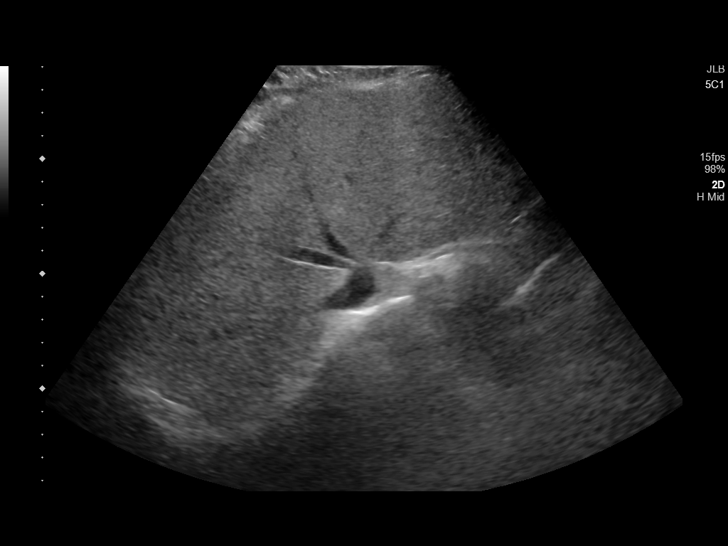
[im 30/36]
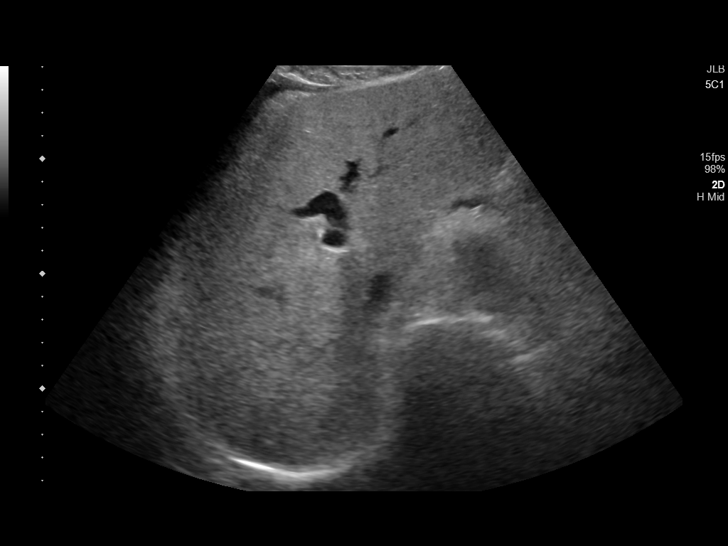
[im 33/36]
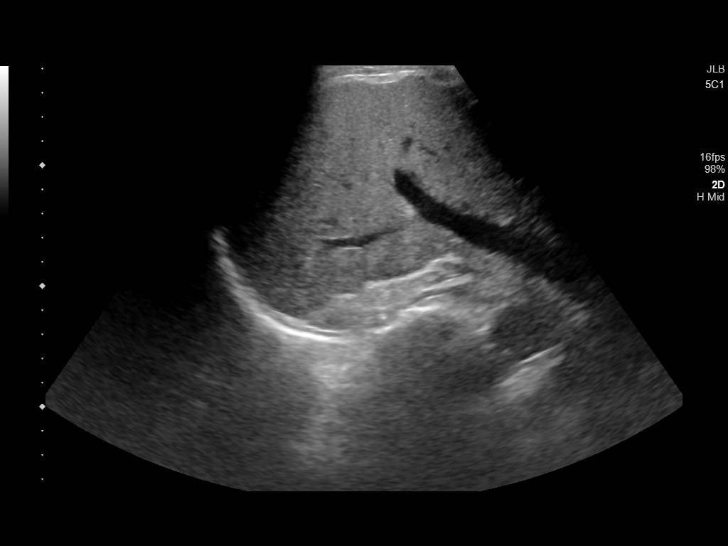
[im 36/36]
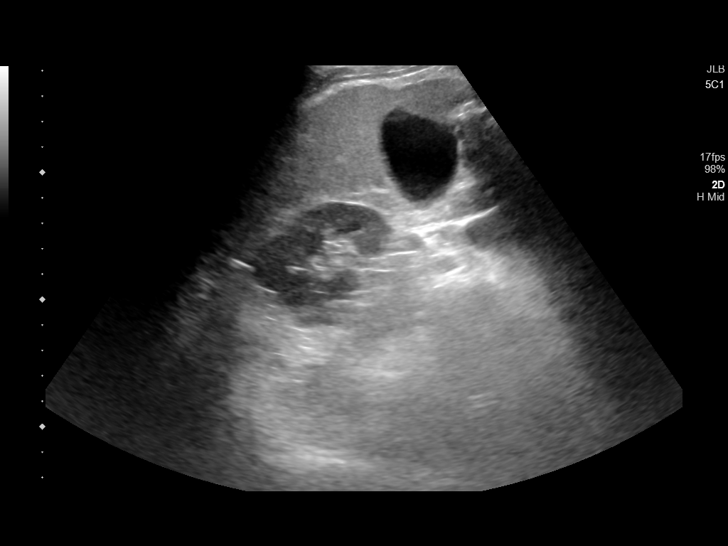

[14 of 25 positions shown; findings below may reference images not displayed]

FINDINGS: Gallbladder:

No gallstones or wall thickening visualized. No sonographic Murphy
sign noted by sonographer.

Common bile duct:

Diameter: 5.4 mm

Liver:

Diffusely echogenic. No mass seen. Portal vein is patent on color
Doppler imaging with normal direction of blood flow towards the
liver.
IMPRESSION: 1. Diffusely echogenic liver. This is most commonly seen with
steatosis. Chronic hepatitis and cirrhosis can also produce this
appearance.
2. No acute abnormality.

## 2018-10-28 IMAGING — CR DG CHEST 2V
2 series · 2 of 2 positions shown · non-contrast
Comparison: [DATE]

CLINICAL DATA: Right upper quadrant pain

EXAM:
CHEST - 2 VIEW

[chest pa]
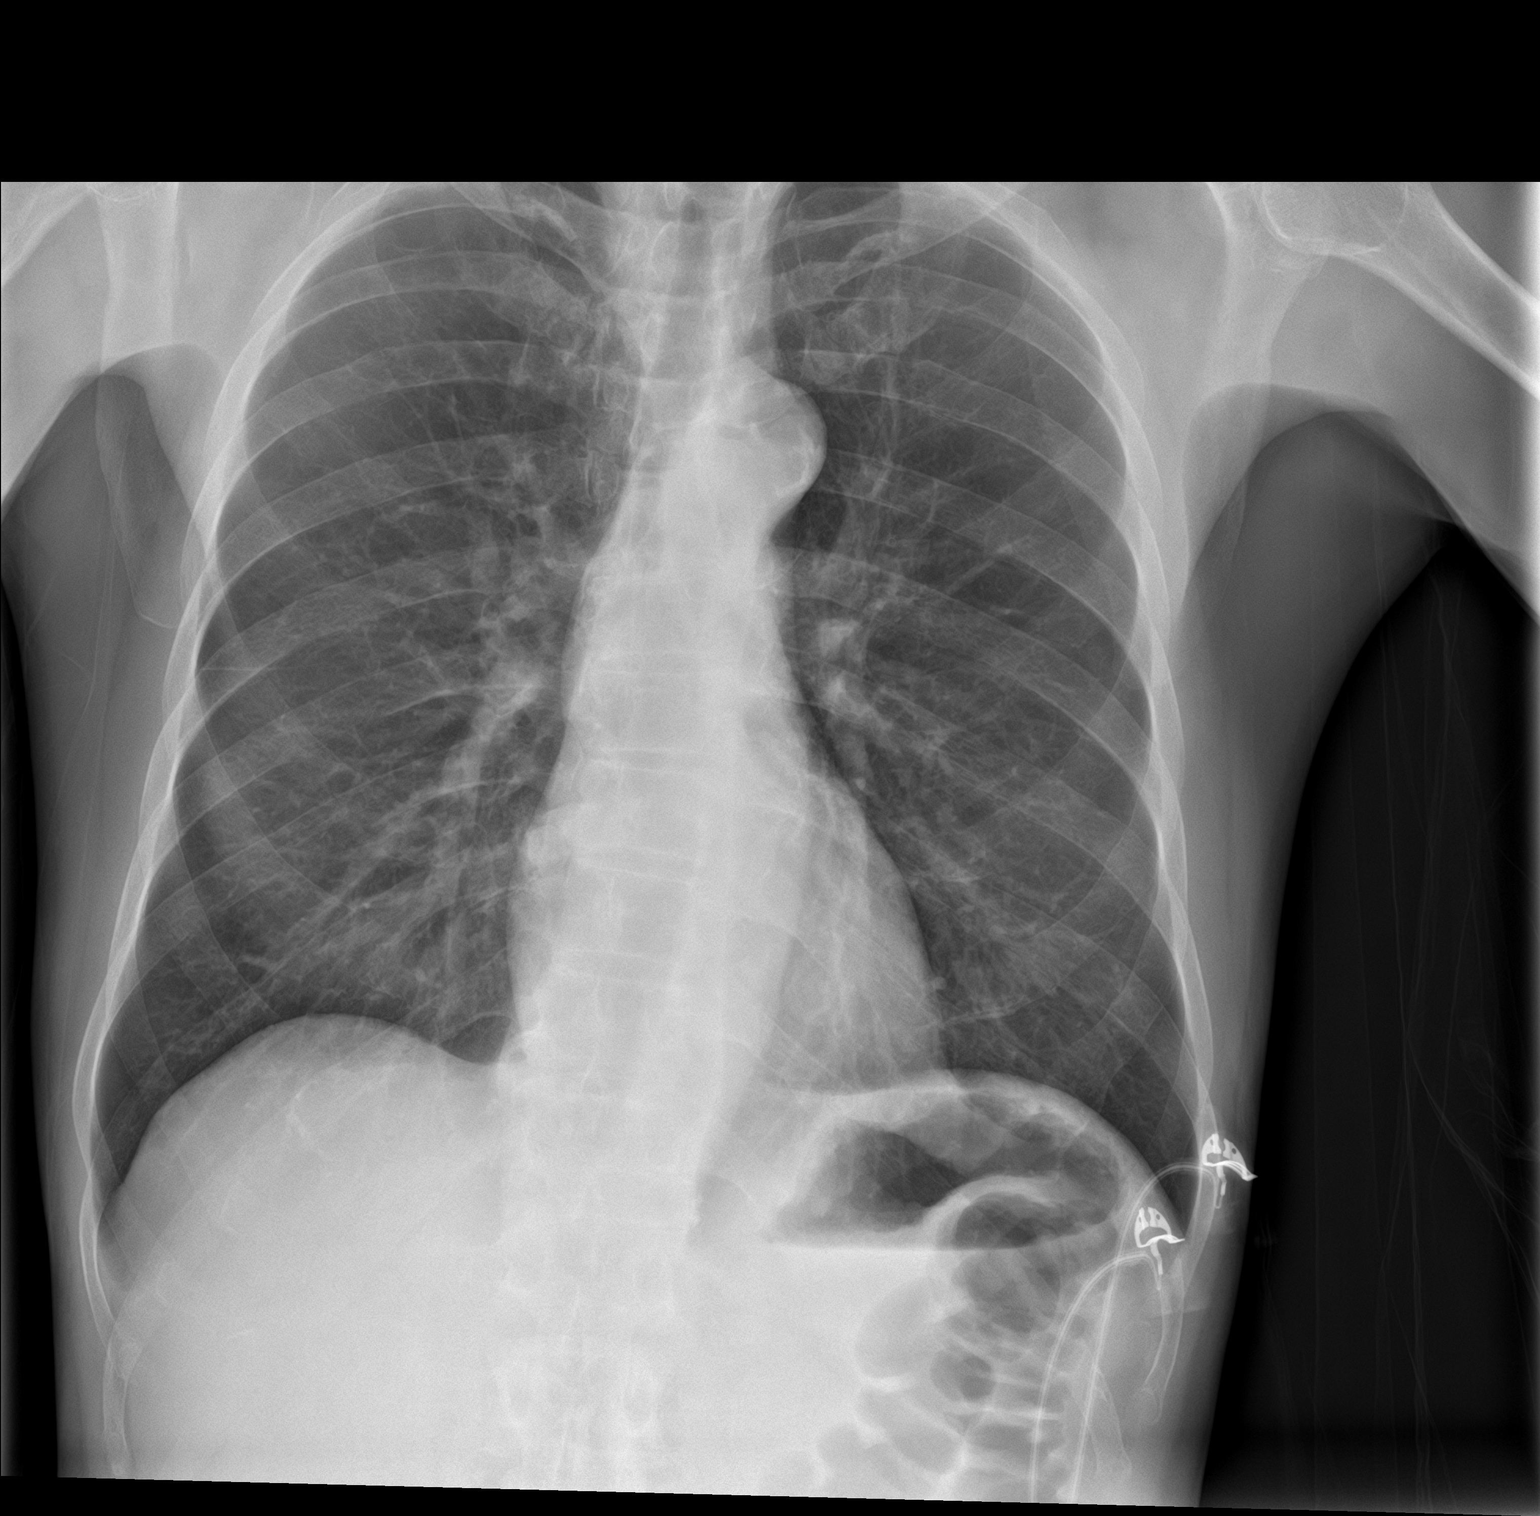

[chest lat]
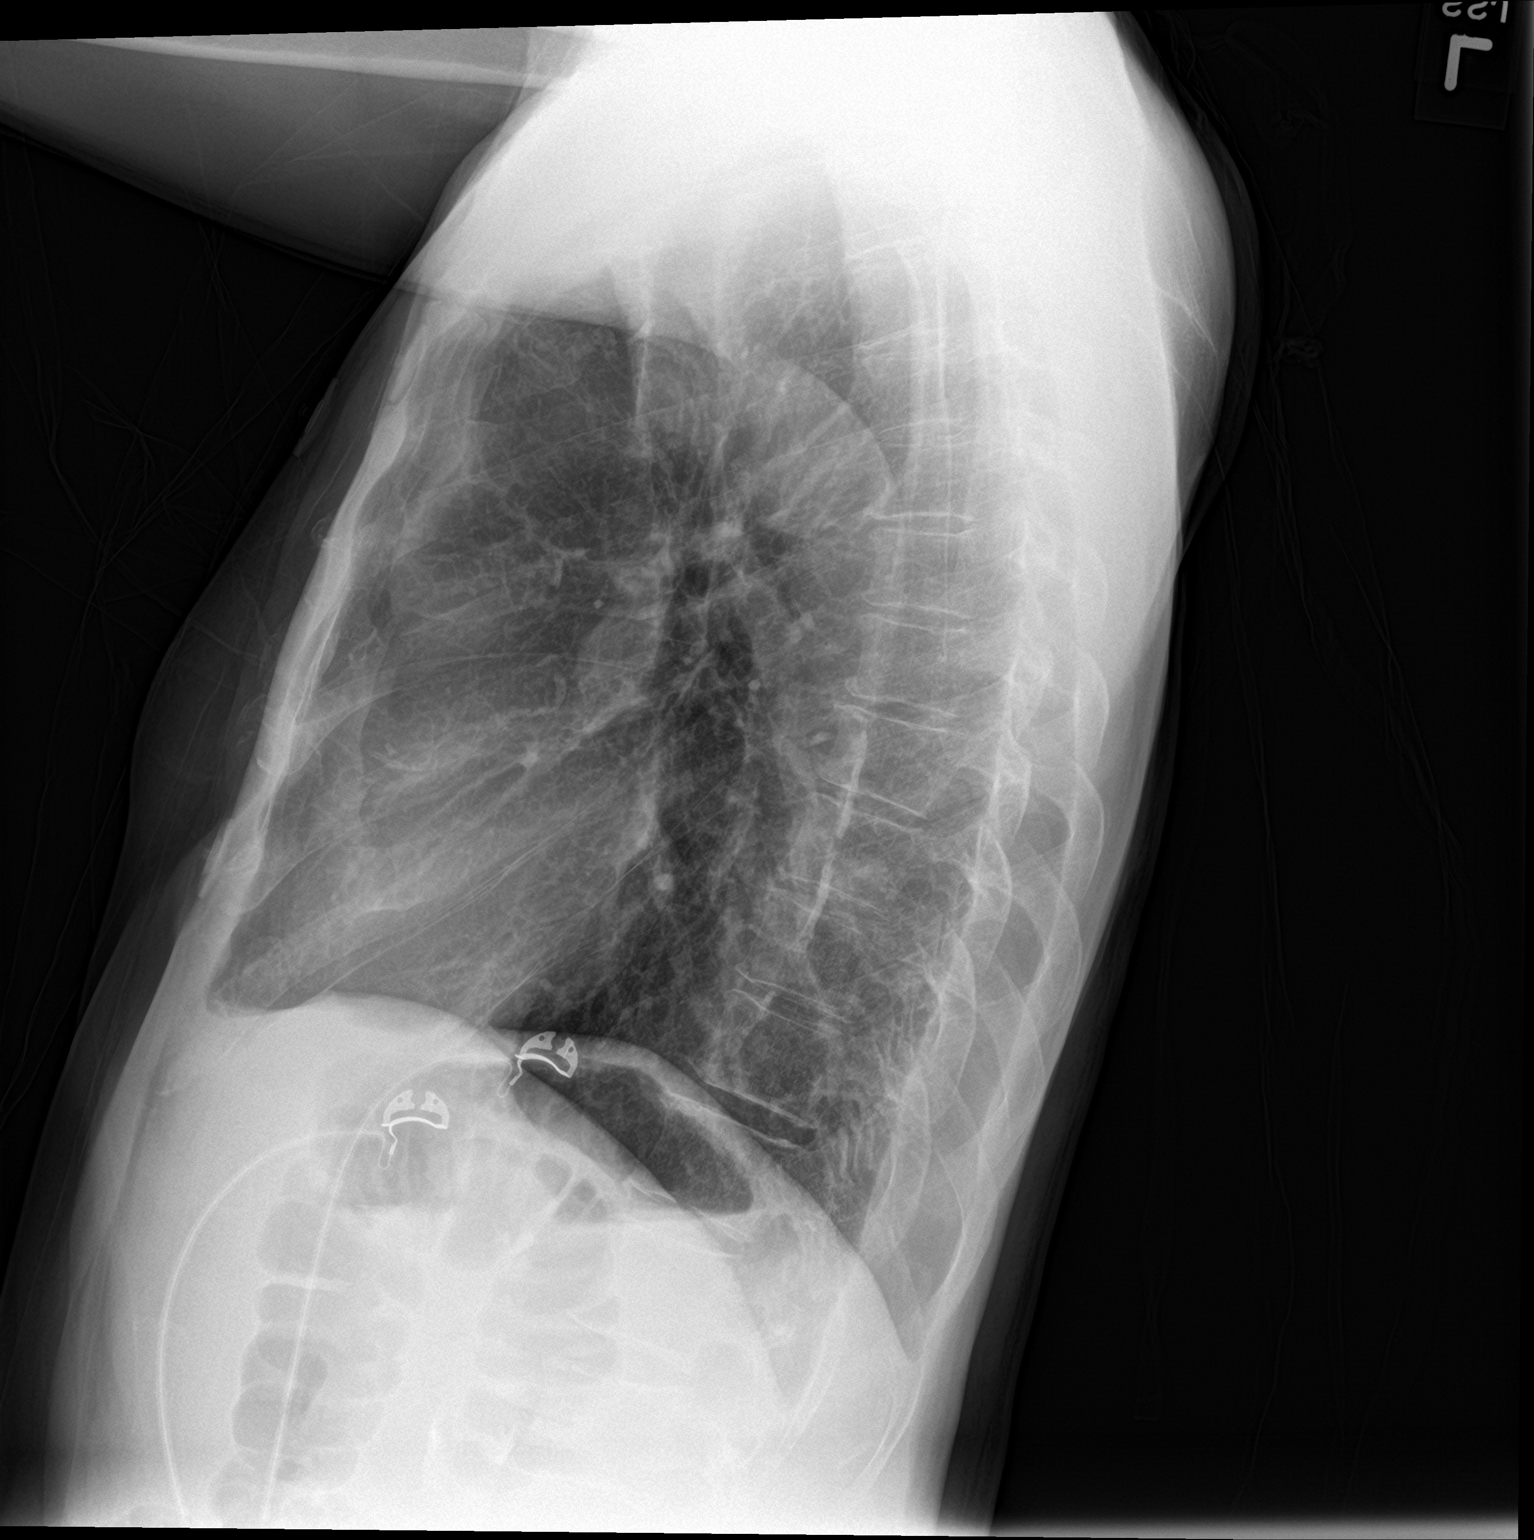

[2 of 2 positions shown; findings below may reference images not displayed]

FINDINGS: COPD with pulmonary hyperinflation.  Apical blebs bilaterally.

Heart size and pulmonary vascularity normal. Negative for infiltrate
effusion or mass. Atherosclerotic aortic arch.
IMPRESSION: COPD without acute cardiopulmonary abnormality.

## 2018-10-28 IMAGING — CT CT RENAL STONE PROTOCOL
2 of 4 series · 16 of 46 positions shown, 18 images · non-contrast
Comparison: Ultrasound from earlier in the same day.

CLINICAL DATA: Right-sided abdominal pain for 2 days

EXAM:
CT ABDOMEN AND PELVIS WITHOUT CONTRAST
TECHNIQUE: Multidetector CT imaging of the abdomen and pelvis was performed
following the standard protocol without IV contrast.

[Series 2: stone full standard · axial · 0.73mm/px · z∈[-505,-145]mm · 13 of 80 slices shown, 15 images]
[im 4/80  soft-tissue]
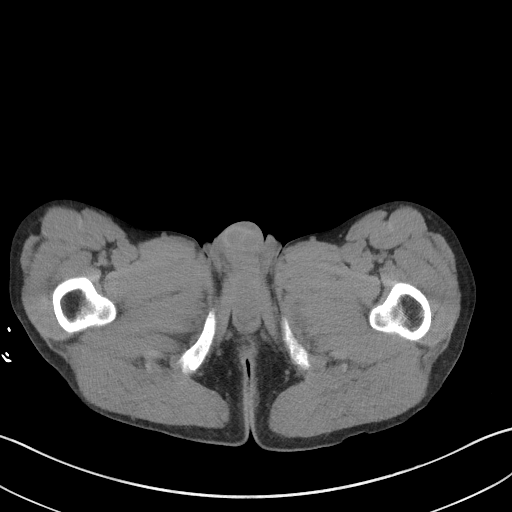
[im 4/80  bone]
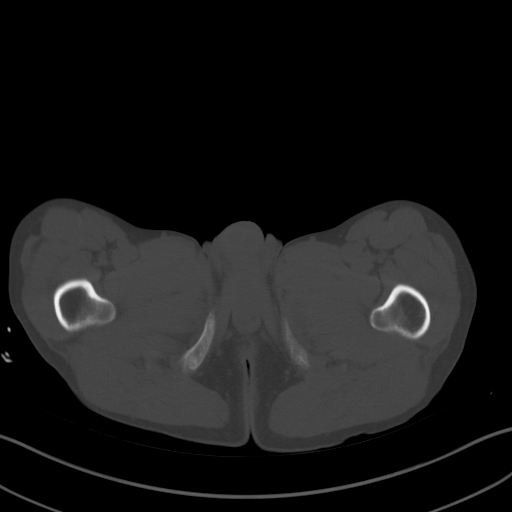
[im 10/80  soft-tissue]
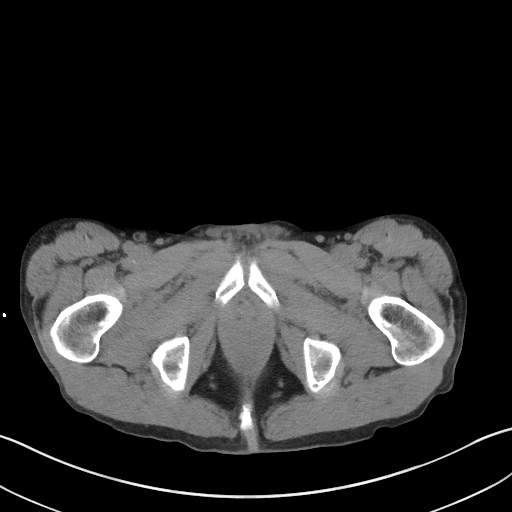
[im 16/80  soft-tissue]
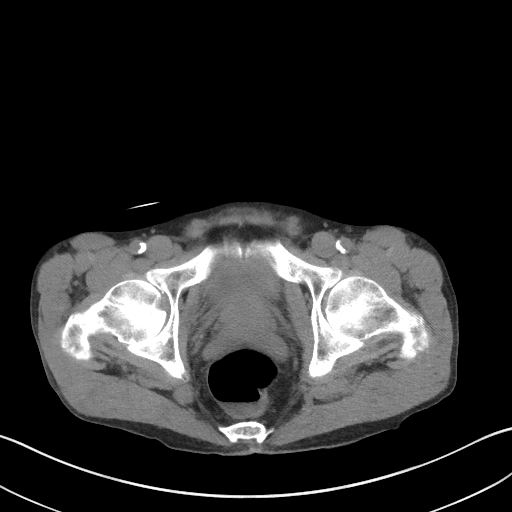
[im 23/80  soft-tissue]
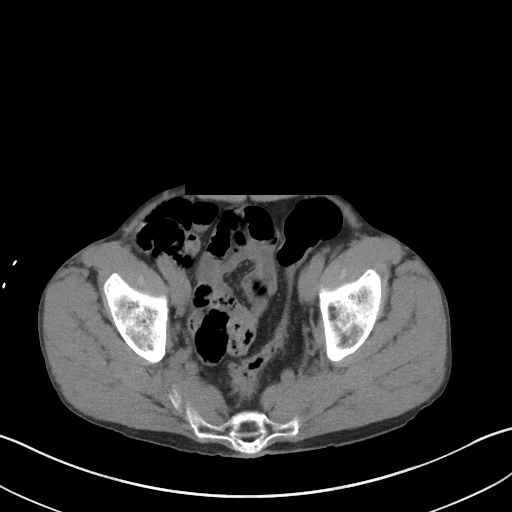
[im 29/80  soft-tissue]
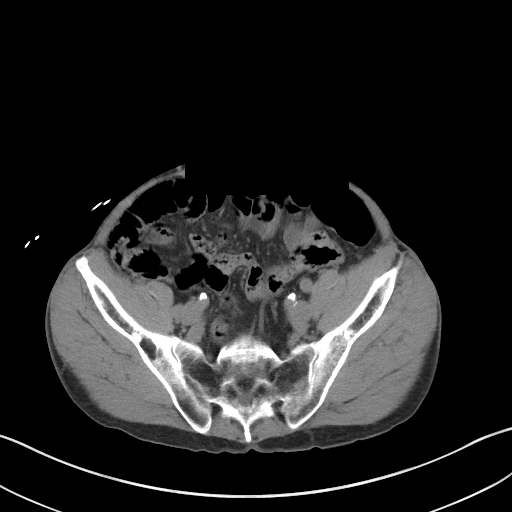
[im 35/80  soft-tissue]
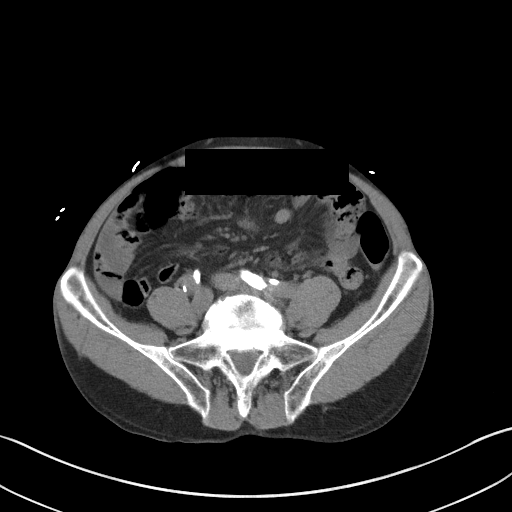
[im 42/80  soft-tissue]
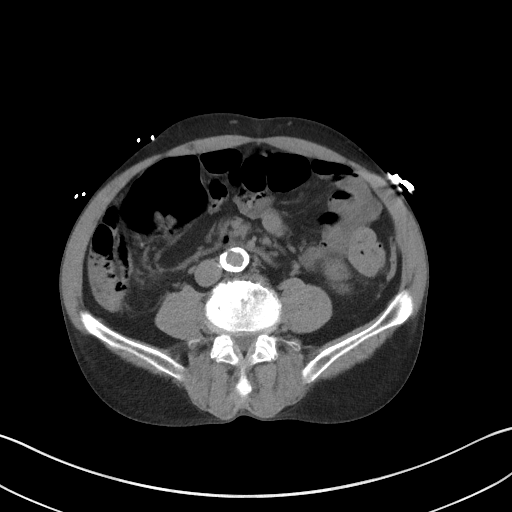
[im 45/80  soft-tissue]
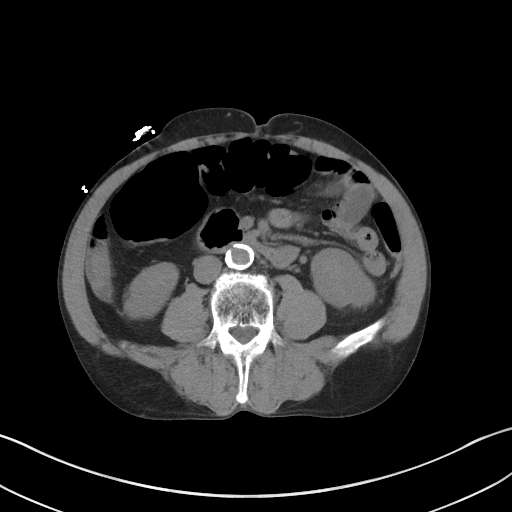
[im 51/80  soft-tissue]
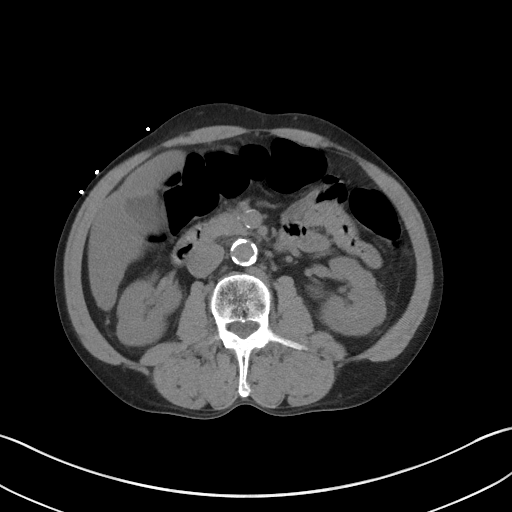
[im 51/80  bone]
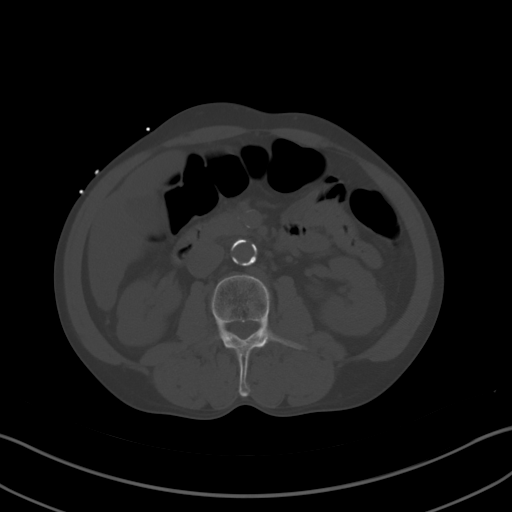
[im 57/80  soft-tissue]
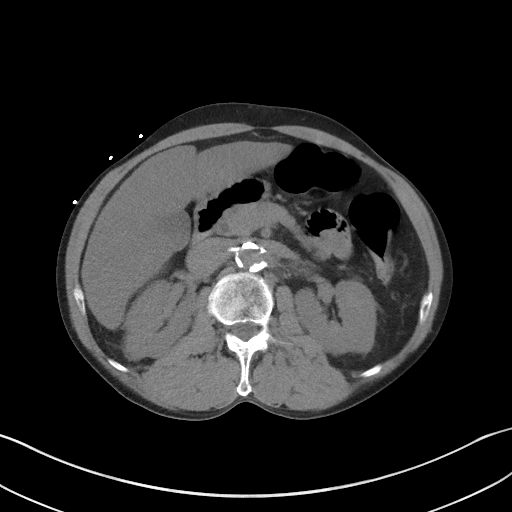
[im 64/80  soft-tissue]
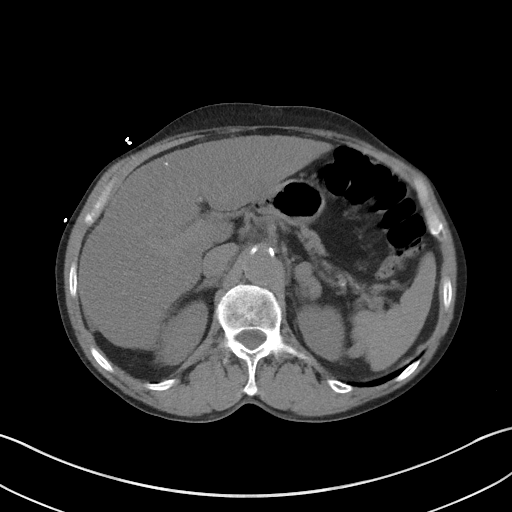
[im 70/80  soft-tissue]
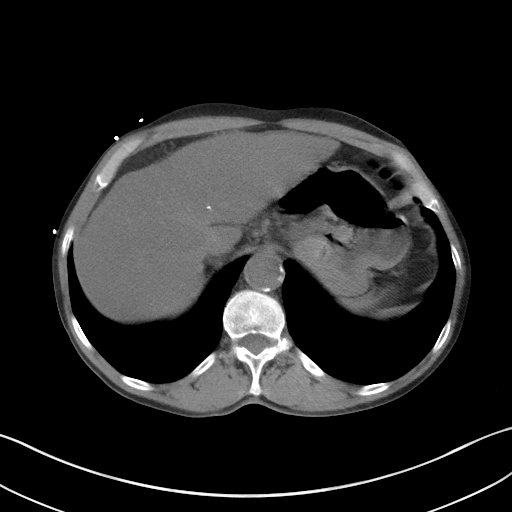
[im 76/80  soft-tissue]
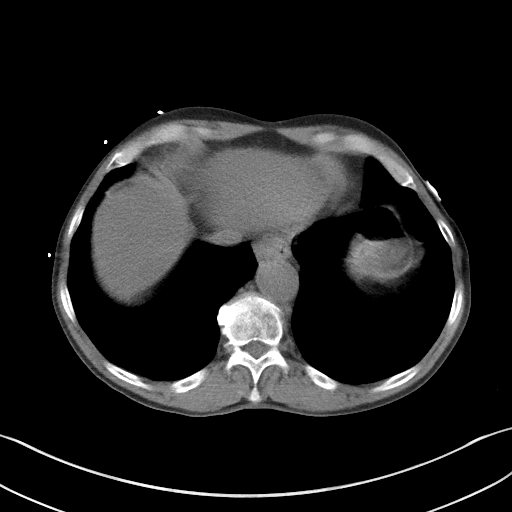

[Series 6: coronal · coronal · 0.66mm/px · 3 of 147 slices shown]
[im 49/147  soft-tissue]
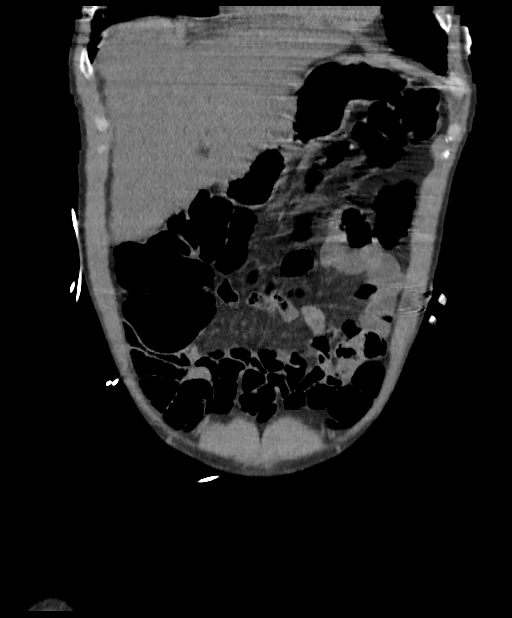
[im 65/147  soft-tissue]
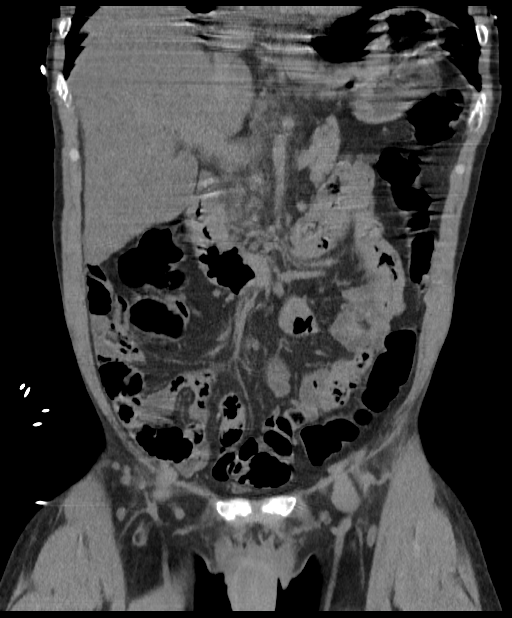
[im 82/147  soft-tissue]
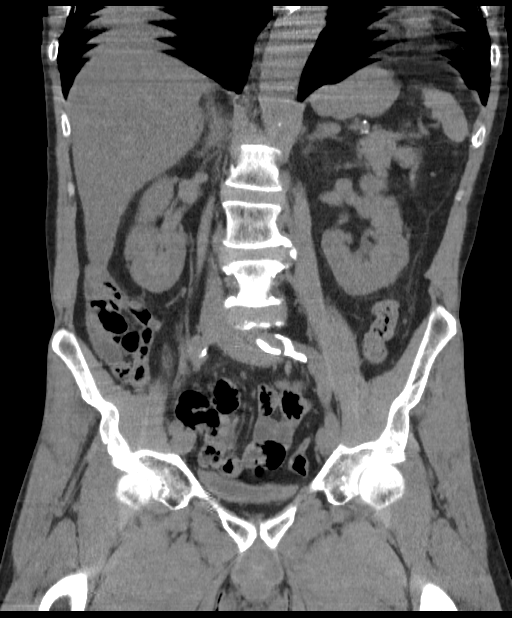

[16 of 46 positions shown; findings below may reference images not displayed]

FINDINGS: Lower chest: Lung bases are free of acute infiltrate or sizable
effusion.

Hepatobiliary: Diffuse decreased attenuation is noted throughout the
liver. No focal mass is noted. Some micro nodularity along the liver
contour is noted which may be related to underlying cirrhotic
change. Gallbladder is within normal limits.

Pancreas: Unremarkable. No pancreatic ductal dilatation or
surrounding inflammatory changes.

Spleen: Normal in size without focal abnormality.

Adrenals/Urinary Tract: Adrenal glands are within normal limits.
Kidneys are well visualized bilaterally without renal calculi or
obstructive changes. The bladder is decompressed.

Stomach/Bowel: Stomach is within normal limits. Appendix appears
normal. No evidence of bowel wall thickening, distention, or
inflammatory changes.

Vascular/Lymphatic: Aortic atherosclerosis. No enlarged abdominal or
pelvic lymph nodes.

Reproductive: Prostate is unremarkable.

Other: No abdominal wall hernia or abnormality. No abdominopelvic
ascites.

Musculoskeletal: Degenerative changes of lumbar spine are noted.
IMPRESSION: Diffuse decreased attenuation within the liver. This may represent a
combination of cirrhotic change in fatty infiltration given the
micro nodular contour. These changes are similar to that seen on
recent ultrasound.

No acute abnormality is identified.

## 2018-10-28 MED ORDER — ONDANSETRON HCL 4 MG/2ML IJ SOLN
4.0000 mg | Freq: Once | INTRAMUSCULAR | Status: AC
  Administered 2018-10-28: 4 mg via INTRAVENOUS
  Filled 2018-10-28: qty 2

## 2018-10-28 MED ORDER — HYDROCODONE-ACETAMINOPHEN 5-325 MG PO TABS: 1.0000 | ORAL_TABLET | ORAL | 0 refills | Status: DC | PRN

## 2018-10-28 MED ORDER — SODIUM CHLORIDE 0.9 % IV BOLUS
1000.0000 mL | Freq: Once | INTRAVENOUS | Status: AC
  Administered 2018-10-28: 1000 mL via INTRAVENOUS

## 2018-10-28 MED ORDER — MORPHINE SULFATE (PF) 4 MG/ML IV SOLN
4.0000 mg | Freq: Once | INTRAVENOUS | Status: AC
  Administered 2018-10-28: 4 mg via INTRAVENOUS
  Filled 2018-10-28: qty 1

## 2018-10-28 NOTE — ED Notes (Signed)
Patient transported to CT 

## 2018-10-28 NOTE — ED Provider Notes (Signed)
[facility] Emergency Department Provider Note  Time seen: 9:33 AM  I have reviewed the triage vital signs and the nursing notes.   HISTORY  Chief Complaint Abdominal Pain    HPI Mark Carr is a 62 y.o. male with no significant past medical history, daily alcohol use, presents to the emergency department for right upper quadrant pain.  According to the patient yesterday he developed right upper quadrant pain that has worsened today.  Denies any association with food.  Denies any vomiting or diarrhea.  No fever.  No pain in the chest or shortness of breath.  No history of similar pain in the past.  No abdominal surgeries.   History reviewed. No pertinent past medical history.  There are no active problems to display for this patient.   History reviewed. No pertinent surgical history.  Prior to Admission medications   Not on File    No Known Allergies  No family history on file.  Social History Social History   Tobacco Use  . Smoking status: Never Smoker  . Smokeless tobacco: Never Used  Substance Use Topics  . Alcohol use: Yes    Alcohol/week: 1.0 standard drinks    Types: 1 Cans of beer per week    Comment: 1 40 oz beer per day  . Drug use: Not Currently    Review of Systems Constitutional: Negative for fever. Cardiovascular: Negative for chest pain. Respiratory: Negative for shortness of breath. Gastrointestinal: Right upper quadrant abdominal pain.  Negative for vomiting or diarrhea Genitourinary: Negative for urinary compaints Musculoskeletal: Negative for musculoskeletal complaints Skin: Negative for skin complaints  Neurological: Negative for headache All other ROS negative  ____________________________________________   PHYSICAL EXAM:  VITAL SIGNS: ED Triage Vitals [10/28/18 0902]  Enc Vitals Group     BP (!) 150/93     Pulse Rate 89     Resp 20     Temp      Temp Source Oral     SpO2 100 %     Weight 140 lb (63.5  kg)     Height 5\' 8"  (1.727 m)     Head Circumference      Peak Flow      Pain Score 6     Pain Loc      Pain Edu?      Excl. in [place]?    Constitutional: Alert and oriented. Well appearing and in no distress. Eyes: Normal exam ENT   Head: Normocephalic and atraumatic.   Mouth/Throat: Mucous membranes are moist. Cardiovascular: Normal rate, regular rhythm. No murmur Respiratory: Normal respiratory effort without tachypnea nor retractions. Breath sounds are clear Gastrointestinal: Soft, mild right upper quadrant tenderness to palpation.  No rebound guarding or distention. Musculoskeletal: Nontender with normal range of motion in all extremities.  Neurologic:  Normal speech and language. No gross focal neurologic deficits  Skin:  Skin is warm, dry and intact.  Psychiatric: Mood and affect are normal.  ____________________________________________    EKG  EKG viewed and interpreted by myself shows a normal sinus rhythm at 91 bpm with a narrow QRS, normal axis, normal intervals, no concerning ST changes.  No ST elevation.  ____________________________________________    RADIOLOGY  Ultrasound shows hepatic steatosis. Chest x-ray shows COPD without acute abnormality. CT scan shows steatosis for cirrhosis otherwise largely negative.  ____________________________________________   INITIAL IMPRESSION / ASSESSMENT AND PLAN / ED COURSE  Pertinent labs & imaging results that were available during my care of the  patient were reviewed by me and considered in my medical decision making (see chart for details).  Patient presents to the emergency department for right upper quadrant abdominal pain.  Differential would include cholecystitis, biliary colic, pancreatitis, musculoskeletal pain.  We will check labs, right upper quadrant ultrasound as well as a chest x-ray.  Patient denies any falls or trauma to the area.  Does admit daily alcohol use.  Patient's labs have resulted showing  a mild anion gap elevation with a mild decrease in bicarbonate possibly indicating a degree of alcoholic ketoacidosis.  Patient's glucose is normal.  LFTs are mildly elevated.  Right upper quadrant ultrasound shows hepatic steatosis otherwise largely negative.  We will proceed with CT renal scan to further evaluate.  Patient CT scan has resulted showing hepatic steatosis versus mild cirrhosis, no other acute abnormalities.  Patient states he is feeling better after pain medication.  We will discharge with a short course of pain medication have the patient follow-up with his primary care doctor.  I discussed return precautions.  Patient agreeable to plan of care.  ____________________________________________   FINAL CLINICAL IMPRESSION(S) / ED DIAGNOSES  Right upper quadrant abdominal pain    Harvest Dark, MD 10/28/18 1314

## 2018-10-28 NOTE — ED Triage Notes (Signed)
Pt to ED via [facility] from home for right sided Abd pain x 2 days, pt states that the pain is worse today. Pt denies N/V/D. Pt denies any known injury to the area. Pt is tender on palpation. Pt is in NAD at this time.

## 2018-10-28 NOTE — ED Notes (Signed)
Pt taken to scans

## 2019-01-11 ENCOUNTER — Ambulatory Visit: Payer: Self-pay | Admitting: Family Medicine

## 2019-01-11 NOTE — Progress Notes (Deleted)
Patient: Mark Carr, Male    DOB: 1956/01/30, 63 y.o.   MRN: [id number] Visit Date: 01/11/2019  Today's Provider: [name], MD   No chief complaint on file.  Subjective:     New Patient:  Mark Carr is a 63 y.o. male who presents today to Establish Care.  He feels {DESC; WELL/FAIRLY WELL/POORLY:18703}. He reports exercising ***. He reports he is sleeping {DESC; WELL/FAIRLY WELL/POORLY:18703}.  -----------------------------------------------------------------   Review of Systems  Constitutional: Negative.   HENT: Negative.   Eyes: Negative.   Respiratory: Negative.   Cardiovascular: Negative.   Gastrointestinal: Negative.   Endocrine: Negative.   Genitourinary: Negative.   Musculoskeletal: Negative.   Skin: Negative.   Allergic/Immunologic: Negative.   Neurological: Negative.   Hematological: Negative.   Psychiatric/Behavioral: Negative.     Social History      He  reports that he has never smoked. He has never used smokeless tobacco. He reports current alcohol use of about 1.0 standard drinks of alcohol per week. He reports previous drug use.       Social History   Socioeconomic History  . Marital status: Single    Spouse name: Not on file  . Number of children: Not on file  . Years of education: Not on file  . Highest education level: Not on file  Occupational History  . Not on file  Social Needs  . Financial resource strain: Not on file  . Food insecurity:    Worry: Not on file    Inability: Not on file  . Transportation needs:    Medical: Not on file    Non-medical: Not on file  Tobacco Use  . Smoking status: Never Smoker  . Smokeless tobacco: Never Used  Substance and Sexual Activity  . Alcohol use: Yes    Alcohol/week: 1.0 standard drinks    Types: 1 Cans of beer per week    Comment: 1 40 oz beer per day  . Drug use: Not Currently  . Sexual activity: Not on file  Lifestyle  . Physical activity:    Days per week: Not on  file    Minutes per session: Not on file  . Stress: Not on file  Relationships  . Social connections:    Talks on phone: Not on file    Gets together: Not on file    Attends religious service: Not on file    Active member of club or organization: Not on file    Attends meetings of clubs or organizations: Not on file    Relationship status: Not on file  Other Topics Concern  . Not on file  Social History Narrative  . Not on file    No past medical history on file.   There are no active problems to display for this patient.   No past surgical history on file.  Family History        No family status information on file.        His family history is not on file.      No Known Allergies   Current Outpatient Medications:  .  HYDROcodone-acetaminophen (NORCO/VICODIN) 5-325 MG tablet, Take 1 tablet by mouth every 4 (four) hours as needed., Disp: 15 tablet, Rfl: 0   Patient Care Team: System, Pcp Not In as PCP - General    Objective:    Vitals: There were no vitals taken for this visit.  There were no vitals filed for this visit.  Physical Exam   Depression Screen No flowsheet data found.     Assessment & Plan:     Routine Health Maintenance and Physical Exam  Exercise Activities and Dietary recommendations Goals   None      There is no immunization history on file for this patient.  There are no preventive care reminders to display for this patient.   Discussed health benefits of physical activity, and encouraged him to engage in regular exercise appropriate for his age and condition.    --------------------------------------------------------------------    [name], MD  [place] [facility]

## 2019-05-15 NOTE — Progress Notes (Signed)
Patient: Mark Carr, Male    DOB: 01-24-1956, 63 y.o.   MRN: [id number] Visit Date: 05/18/2019  Today's Provider: [name], MD   Chief Complaint  Patient presents with  . Establish Care   Subjective:     Virtual Visit via Telephone Note  I connected with Mark Carr on 05/18/19 at  9:00 AM EDT by telephone and verified that I am speaking with the correct person using two identifiers.   Patient location: home Provider location: [place] involved in the visit: patient, provider   I discussed the limitations, risks, security and privacy concerns of performing an evaluation and management service by telephone and the availability of in person appointments. I also discussed with the patient that there may be a patient responsible charge related to this service. The patient expressed understanding and agreed to proceed.     Establish Care: Mark Carr is a 63 y.o. male who presents today to establish care.   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  Exam was benign in the emergency department with normal EKG.  Chest x-ray showed COPD without acute abnormality.  CT abdomen pelvis showed steatosis without cirrhosis and this was seen on right upper quadrant ultrasound as well.  It was noted that his LFTs are mildly elevated and he had a mild anion gap.  He was treated with pain medications and advised to follow-up with primary care.  He denies any new medications.  He does admit to drinking one 40 ounce beer daily for many years.  He states that he will start drinking it in the morning and drink at all through the day.  He believes that he likely needs to cut back, but does  not want to. -----------------------------------------------------------------   Review of Systems  Constitutional: Positive for fatigue. Negative for activity change, appetite change, chills, diaphoresis, fever and unexpected weight change.  HENT: Negative.   Eyes: Negative.   Respiratory: Negative.   Cardiovascular: Negative.   Gastrointestinal: Negative.   Endocrine: Negative.   Genitourinary: Negative.   Musculoskeletal: Positive for arthralgias and myalgias. Negative for joint swelling, neck pain and neck stiffness.  Skin: Negative.   Allergic/Immunologic: Negative.   Neurological: Negative.   Hematological: Negative.   Psychiatric/Behavioral: Negative.     Social History      He  reports that he has been smoking cigarettes. He has a 20.00 pack-year smoking history. He has never used smokeless tobacco. He reports current alcohol use of about 1.0 standard drinks of alcohol per week. He reports previous drug use.       Social History   Socioeconomic History  . Marital status: Single    Spouse name: Not on file  . Number of children: 4  . Years of education: Not on file  . Highest education level: Not on file  Occupational History  . Not on file  Social Needs  . Financial resource strain: Not on file  . Food insecurity    Worry: Not on file    Inability: Not on file  . Transportation needs    Medical: Not on file    Non-medical: Not on file  Tobacco Use  . Smoking status: Current Every  Day Smoker    Packs/day: 0.50    Years: 40.00    Pack years: 20.00    Types: Cigarettes  . Smokeless tobacco: Never Used  Substance and Sexual Activity  . Alcohol use: Yes    Alcohol/week: 1.0 standard drinks    Types: 1 Cans of beer per week    Comment: 1 40 oz beer per day  . Drug use: Not Currently  . Sexual activity: Not Currently  Lifestyle  . Physical activity    Days per week: Not on file    Minutes per session: Not on file  . Stress: Not on file  Relationships  .  Social Herbalist on phone: Not on file    Gets together: Not on file    Attends religious service: Not on file    Active member of club or organization: Not on file    Attends meetings of clubs or organizations: Not on file    Relationship status: Not on file  Other Topics Concern  . Not on file  Social History Narrative  . Not on file    Past Medical History:  Diagnosis Date  . Wrist fracture      There are no active problems to display for this patient.   Past Surgical History:  Procedure Laterality Date  . WRIST FRACTURE SURGERY Right 2016    Family History        Family Status  Relation Name Status  . Mother  Alive  . Sister  Deceased  . Brother  Alive  . MGM  (Not Specified)        His family history includes Cancer in his maternal grandmother and sister; Deep vein thrombosis in his mother; Heart disease in his brother; Hypertension in his mother.      No Known Allergies  No current outpatient medications on file.   Patient Care Team: Virginia Crews, MD as PCP - General (Family Medicine)    Objective:    Vitals: Ht 5\' 8"  (1.727 m)   Wt 139 lb (63 kg)   BMI 21.13 kg/m    Vitals:   05/16/19 4196  Weight: 139 lb (63 kg)  Height: 5\' 8"  (1.727 m)     Physical Exam   Depression Screen PHQ 2/9 Scores 05/16/2019  PHQ - 2 Score 0       Assessment & Plan:   I discussed the assessment and treatment plan with the patient. The patient was provided an opportunity to ask questions and all were answered. The patient agreed with the plan and demonstrated an understanding of the instructions.   The patient was advised to call back or seek an in-person evaluation if the symptoms worsen or if the condition fails to improve as anticipated.  I provided 45 minutes of non-face-to-face time during this encounter.   Establish care  Exercise Activities and Dietary recommendations Goals   None      There is no immunization history on file  for this patient.  Health Maintenance  Topic Date Due  . Hepatitis C Screening  [birth year]  . HIV Screening  06/25/1971  . TETANUS/TDAP  06/25/1975  . COLONOSCOPY  06/24/2006  . INFLUENZA VACCINE  06/10/2019     Discussed health benefits of physical activity, and encouraged him to engage in regular exercise appropriate for his age and condition.    --------------------------------------------------------------------  Problem List Items Addressed This Visit      Digestive   Steatosis of liver  Noted on previous US Reviewed this with patient  Likely fatty liver Will recheck LFTs Discussed need to cut back on drinking      Relevant Orders   Lipid panel   Referral to Chronic Care Management Services     Other   Tobacco use disorder    3-5 minute discussion regarding importance of smoking cessation and health risks of continuing to smoke Patient remains pre-contemplation      Macrocytosis    Noted on last CBC with no anemia Will check B12 and CBC      Relevant Orders   B12   CBC w/Diff/Platelet   Referral to Chronic Care Management Services   Elevated LFTs    Likely related to alcohol use vs fatty liver Will recheck LFTs as well as Hep C screening If still elevated, consider further iron and Hepatitis testing      Relevant Orders   Lipid panel   Comprehensive metabolic panel   Referral to Chronic Care Management Services   Chronic fatigue - Primary    Longstanding problem with vague symptoms We will check some screening lab work today including HIV, TSH, CMP, CBC We will also check B12 level as he is a daily alcohol drinker and may be deficient      Relevant Orders   B12   CBC w/Diff/Platelet   TSH   Referral to Chronic Care Management Services   Colon cancer screening    Patient is 72 and has never had any colon cancer screening He does not currently have any insurance and so Cologuard and colonoscopy would be cost prohibitive We could do FOBT  testing, but discussed that this is often a late finding I will set him up with our CCM social worker to see if they can help him complete Medicaid application as he is unemployed and disabled per report and seems as though he would qualify      Alcohol use    Discussed need to cut back Discussed possible liver disease Patient remains precontemplative      RESOLVED: Screening for HIV (human immunodeficiency virus)   Relevant Orders   HIV antibody (with reflex)    Other Visit Diagnoses    Need for hepatitis C screening test       Relevant Orders   Hepatitis C Antibody       Return in about 6 months (around 11/16/2019) for chronic disease f/u.   The entirety of the information documented in the History of Present Illness, Review of Systems and Physical Exam were personally obtained by me. Portions of this information were initially documented by [facility]/Joseline Rosas, CMA and reviewed by me for thoroughness and accuracy.    Ruthene Methvin, Dionne Bucy, MD MPH [facility]

## 2019-05-16 ENCOUNTER — Encounter: Payer: Self-pay | Admitting: Family Medicine

## 2019-05-16 ENCOUNTER — Ambulatory Visit (INDEPENDENT_AMBULATORY_CARE_PROVIDER_SITE_OTHER): Payer: Self-pay | Admitting: Family Medicine

## 2019-05-16 VITALS — Ht 68.0 in | Wt 139.0 lb

## 2019-05-16 DIAGNOSIS — R7989 Other specified abnormal findings of blood chemistry: Secondary | ICD-10-CM

## 2019-05-16 DIAGNOSIS — K760 Fatty (change of) liver, not elsewhere classified: Secondary | ICD-10-CM

## 2019-05-16 DIAGNOSIS — F17200 Nicotine dependence, unspecified, uncomplicated: Secondary | ICD-10-CM

## 2019-05-16 DIAGNOSIS — Z1211 Encounter for screening for malignant neoplasm of colon: Secondary | ICD-10-CM

## 2019-05-16 DIAGNOSIS — R945 Abnormal results of liver function studies: Secondary | ICD-10-CM

## 2019-05-16 DIAGNOSIS — Z7289 Other problems related to lifestyle: Secondary | ICD-10-CM

## 2019-05-16 DIAGNOSIS — Z1159 Encounter for screening for other viral diseases: Secondary | ICD-10-CM

## 2019-05-16 DIAGNOSIS — Z789 Other specified health status: Secondary | ICD-10-CM

## 2019-05-16 DIAGNOSIS — R5382 Chronic fatigue, unspecified: Secondary | ICD-10-CM

## 2019-05-16 DIAGNOSIS — D7589 Other specified diseases of blood and blood-forming organs: Secondary | ICD-10-CM

## 2019-05-16 DIAGNOSIS — Z114 Encounter for screening for human immunodeficiency virus [HIV]: Secondary | ICD-10-CM

## 2019-05-17 ENCOUNTER — Ambulatory Visit: Payer: Self-pay

## 2019-05-17 DIAGNOSIS — Z5989 Other problems related to housing and economic circumstances: Secondary | ICD-10-CM

## 2019-05-17 DIAGNOSIS — Z598 Other problems related to housing and economic circumstances: Secondary | ICD-10-CM

## 2019-05-17 NOTE — Chronic Care Management (AMB) (Signed)
   Chronic Care Management   Unsuccessful Call Note 05/17/2019 Name: MASAJI BILLUPS MRN: [id number] DOB: 1956/03/20  Nova Evett is a 63 year old male who sees Dr. [name] for primary care. Dr. Brita Romp asked the CCM team to consult the patient for care coordination and chronic case management secondary to his uninsured status and need for social work assistance for completion of medicaid application.   Was unable to reach patient via telephone today for introduction to CCM services and to obtain consent. I was unable to leave message as phone just continued to ring.   Plan: Will follow-up within 7 business days via telephone.    Lakeeta Dobosz E. Rollene Rotunda, RN, BSN Nurse Care Coordinator [facility] Care Management [phone]

## 2019-05-18 DIAGNOSIS — R5382 Chronic fatigue, unspecified: Secondary | ICD-10-CM | POA: Insufficient documentation

## 2019-05-18 DIAGNOSIS — R7989 Other specified abnormal findings of blood chemistry: Secondary | ICD-10-CM | POA: Insufficient documentation

## 2019-05-18 DIAGNOSIS — Z114 Encounter for screening for human immunodeficiency virus [HIV]: Secondary | ICD-10-CM | POA: Insufficient documentation

## 2019-05-18 DIAGNOSIS — K760 Fatty (change of) liver, not elsewhere classified: Secondary | ICD-10-CM | POA: Insufficient documentation

## 2019-05-18 DIAGNOSIS — Z7289 Other problems related to lifestyle: Secondary | ICD-10-CM | POA: Insufficient documentation

## 2019-05-18 DIAGNOSIS — Z789 Other specified health status: Secondary | ICD-10-CM | POA: Insufficient documentation

## 2019-05-18 DIAGNOSIS — F17200 Nicotine dependence, unspecified, uncomplicated: Secondary | ICD-10-CM | POA: Insufficient documentation

## 2019-05-18 DIAGNOSIS — Z1211 Encounter for screening for malignant neoplasm of colon: Secondary | ICD-10-CM | POA: Insufficient documentation

## 2019-05-18 DIAGNOSIS — D7589 Other specified diseases of blood and blood-forming organs: Secondary | ICD-10-CM | POA: Insufficient documentation

## 2019-05-18 NOTE — Assessment & Plan Note (Signed)
Noted on previous US Reviewed this with patient  Likely fatty liver Will recheck LFTs Discussed need to cut back on drinking

## 2019-05-18 NOTE — Assessment & Plan Note (Signed)
Longstanding problem with vague symptoms We will check some screening lab work today including HIV, TSH, CMP, CBC We will also check B12 level as he is a daily alcohol drinker and may be deficient

## 2019-05-18 NOTE — Assessment & Plan Note (Signed)
Discussed need to cut back Discussed possible liver disease Patient remains precontemplative

## 2019-05-18 NOTE — Assessment & Plan Note (Signed)
3-5 minute discussion regarding importance of smoking cessation and health risks of continuing to smoke Patient remains pre-contemplation

## 2019-05-18 NOTE — Assessment & Plan Note (Signed)
Likely related to alcohol use vs fatty liver Will recheck LFTs as well as Hep C screening If still elevated, consider further iron and Hepatitis testing

## 2019-05-18 NOTE — Assessment & Plan Note (Signed)
Patient is 89 and has never had any colon cancer screening He does not currently have any insurance and so Cologuard and colonoscopy would be cost prohibitive We could do FOBT testing, but discussed that this is often a late finding I will set him up with our CCM social worker to see if they can help him complete Medicaid application as he is unemployed and disabled per report and seems as though he would qualify

## 2019-05-18 NOTE — Assessment & Plan Note (Signed)
Noted on last CBC with no anemia Will check B12 and CBC

## 2019-05-26 ENCOUNTER — Ambulatory Visit: Payer: Self-pay | Admitting: *Deleted

## 2019-05-26 DIAGNOSIS — Z5989 Other problems related to housing and economic circumstances: Secondary | ICD-10-CM

## 2019-05-26 DIAGNOSIS — R5382 Chronic fatigue, unspecified: Secondary | ICD-10-CM

## 2019-05-26 DIAGNOSIS — Z598 Other problems related to housing and economic circumstances: Secondary | ICD-10-CM

## 2019-05-26 NOTE — Patient Instructions (Signed)
Thank you allowing the Chronic Care Management Team to be a part of your care! It was a pleasure speaking with you today!     [name] was given information about Care Management services today including:  1. Care Management services includes personalized support from designated clinical staff supervised by his physician, including individualized plan of care and coordination with other care providers 2. 24/7 contact phone numbers for assistance for urgent and routine care needs. 3. The patient may stop case management services at any time by phone call to the office staff.  CCM (Chronic Care Management) Team   Trish Fountain RN, BSN Nurse Care Coordinator  [phone]  Ruben Reason PharmD  Clinical Pharmacist  [phone]   Elliot Gurney, LCSW Clinical Social Worker [phone]  Goals Addressed            This Visit's Progress   . "I do not have insurance, I need help with getting a colonoscopy" (pt-stated)       Current Barriers:  . Financial constraints . Lacks knowledge of community resource: community healthcare  Clinical Social Work Clinical Goal(s):  Marland Kitchen Over the next 30 days, client will work with SW to address concerns related to community health care  Interventions: . Patient interviewed and appropriate assessments performed . Confirmed that patient is currently uninsured and has been denied Medicaid twice in the last year . Confirmed with patient that he is currently unemployed and currently receives social security only . Provided patient with information about community healthcare options, contact information provided for the [facility] [phone] and the [facility] [phone] . Encouraged patient to call the community healthcare options to discuss possibility of ongoing medical care . Discussed plans with patient for ongoing care management follow up and provided patient with direct contact information for care management  team  Patient Self Care Activities:  . Performs ADL's independently . Performs IADL's independently . Calls provider office for new concerns or questions  Initial goal documentation         The patient verbalized understanding of instructions provided today and declined a print copy of patient instruction materials.   The care management team will reach out to the patient again over the next 14 days.

## 2019-05-26 NOTE — Chronic Care Management (AMB) (Signed)
  Care Management    05/26/2019 Name: Mark Carr MRN: [id number] DOB: Mar 31, 1956  Referred by: Virginia Crews, MD Reason for referral : Chronic Care Management (unisured)   Mark Carr is a 63 y.o. year old male who is a primary care patient of Bacigalupo, Dionne Bucy, MD. The care management team was consulted for assistance with chronic disease management and care coordination needs.   Review of patient status, including review of consultants reports, relevant laboratory and other test results, and collaboration with appropriate care team members and the patient's provider was performed as part of comprehensive patient evaluation and provision of care management services.     Goals Addressed            This Visit's Progress   . "I do not have insurance, I need help with getting a colonoscopy" (pt-stated)       Current Barriers:  . Financial constraints . Lacks knowledge of community resource: community healthcare  Clinical Social Work Clinical Goal(s):  Marland Kitchen Over the next 30 days, client will work with SW to address concerns related to community health care  Interventions: . Patient interviewed and appropriate assessments performed . Confirmed that patient is currently uninsured and has been denied Medicaid twice in the last year . Confirmed with patient that he is currently unemployed and currently receives social security only . Provided patient with information about community healthcare options, contact information provided for the [facility] [phone] and the [facility] [phone] . Encouraged patient to call the community healthcare options to discuss possibility of ongoing medical care . Discussed plans with patient for ongoing care management follow up and provided patient with direct contact information for care management team  Patient Self Care Activities:  . Performs ADL's independently . Performs IADL's independently . Calls  provider office for new concerns or questions  Initial goal documentation          [name] was given information about Care Management services today including:  1. Care Management services includes personalized support from designated clinical staff supervised by his physician, including individualized plan of care and coordination with other care providers 2. 24/7 contact phone numbers for assistance for urgent and routine care needs. 3. The patient may stop case management services at any time by phone call to the office staff.  Patient agreed to services and verbal consent obtained.    Follow up plan: The care management team will reach out to the patient again over the next 14 days.    Elliot Gurney, [place] Worker  [place] [facility] Care Management [phone]

## 2019-06-02 ENCOUNTER — Telehealth: Payer: Self-pay

## 2019-06-07 ENCOUNTER — Telehealth: Payer: Self-pay | Admitting: *Deleted

## 2019-06-07 ENCOUNTER — Ambulatory Visit: Payer: Self-pay | Admitting: *Deleted

## 2019-06-07 NOTE — Chronic Care Management (AMB) (Signed)
    Care Management   Unsuccessful Call Note 06/07/2019 Name: Mark Carr MRN: [id number] DOB: [birth year]  Patient  is a 63year old male who sees Dr. Brita Romp for primary care. Dr. Brita Romp asked the CCM team to consult the patient with obtaining a colonoscopy. Patient is currently uninsured.    This social worker contacted the Di[facility] Schein, who referred this social worker to [facility]. This social worker contacted [facility], who stated that patient will have to call to complete a screening over the phone. If patient meets the preliminary requirements, patient would then be mailed an application to complete and return. Patient would then need to have the the procedure by a [facility].   This social worker was unable to reach patient via telephone today to provide the above information. The phone just rang, no message able to be left (unsuccessful outreach #1).   Plan: Will follow-up within 7 business days via telephone.      Elliot Gurney, [place] Worker  [place] [facility] Care Management [phone]

## 2019-06-14 ENCOUNTER — Telehealth: Payer: Self-pay | Admitting: *Deleted

## 2019-06-14 ENCOUNTER — Ambulatory Visit: Payer: Self-pay | Admitting: *Deleted

## 2019-06-14 DIAGNOSIS — Z598 Other problems related to housing and economic circumstances: Secondary | ICD-10-CM

## 2019-06-14 DIAGNOSIS — Z5989 Other problems related to housing and economic circumstances: Secondary | ICD-10-CM

## 2019-06-14 DIAGNOSIS — R5382 Chronic fatigue, unspecified: Secondary | ICD-10-CM

## 2019-06-14 NOTE — Chronic Care Management (AMB) (Signed)
    Care Management   Unsuccessful Call Note 06/14/2019 Name: Mark Carr MRN: [id number] DOB: 03-21-1956   Patient  is a 63year old [place] sees Dr. Brita Romp for primary care. Dr. Charlesetta Shanks the CCM team to consult the patient with obtaining a colonoscopy. Patient is currently uninsured.    This social worker contacted the Lillian Schein, who referred this social worker to [facility]. This social worker contacted [facility], who stated that patient will have to call to complete a screening over the phone. If patient meets the preliminary requirements, patient would then be mailed an application to complete and return.Patient would then need to have the the procedure by a [facility].  This social worker was unable to reach patient via telephone today to provide the above information. HIPPA compliant message requesting a return call left with patient's mother. (unsuccessful outreach #2).    Plan: Will follow-up within 7 business days via telephone.       Elliot Gurney, [place] Worker  [place] [facility] Care Management [phone]

## 2019-06-21 ENCOUNTER — Ambulatory Visit: Payer: Self-pay | Admitting: *Deleted

## 2019-06-21 ENCOUNTER — Encounter: Payer: Self-pay | Admitting: *Deleted

## 2019-06-21 DIAGNOSIS — Z598 Other problems related to housing and economic circumstances: Secondary | ICD-10-CM

## 2019-06-21 DIAGNOSIS — R5382 Chronic fatigue, unspecified: Secondary | ICD-10-CM

## 2019-06-21 DIAGNOSIS — Z5989 Other problems related to housing and economic circumstances: Secondary | ICD-10-CM

## 2019-06-21 NOTE — Chronic Care Management (AMB) (Signed)
Care Management    Clinical Social Work Follow Up Note  06/21/2019 Name: Mark Carr MRN: [id number] DOB: 02/06/1956  Mark Carr is a 63 y.o. year old male who is a primary care patient of Bacigalupo, Dionne Bucy, MD. The CCM team was consulted for assistance with Community Resources  to assist with getting needed medical screenings.   Social Determinants of Health Screening completed identify patient as high risk due to daily tobacco use. Cessation assistance offered to patient but was declined.  Review of patient status, including review of consultants reports, other relevant assessments, and collaboration with appropriate care team members and the patient's provider was performed as part of comprehensive patient evaluation and provision of chronic care management services.     Goals Addressed            This Visit's Progress   . "I do not have insurance, I need help with getting a colonoscopy" (pt-stated)       Current Barriers:  . Financial constraints . Lacks knowledge of community resource: community healthcare  Clinical Social Work Clinical Goal(s):  Marland Kitchen Over the next 90 days, client will work with SW to address concerns related to community health care  Interventions: . Patient interviewed and appropriate assessments performed . Confirmed that patient is currently uninsured and has been denied Medicaid twice in the last year . Confirmed with patient that he is currently unemployed and currently receives social security only . Provided patient with information about applying for [facility] [phone] ext [phone] for assistance with getting needed screenings done . Encouraged patient to call [facility] to complete the screening and paperwork required . Encouraged patient to call this social worker back with questions or assistance with paperwork process . Discussed plans with patient for ongoing care management follow up and provided patient with direct contact  information for care management team  Patient Self Care Activities:  . Performs ADL's independently . Performs IADL's independently . Calls provider office for new concerns or questions  Please see past updates related to this goal by clicking on the "Past Updates" button in the selected goal          Follow Up Plan: Client will contact [facility] to complete screening and paperwork process for assistance with recommended medical screenings    Chrystal Land, [place] Worker  [place] [facility] Care Management [phone]

## 2019-06-21 NOTE — Patient Instructions (Signed)
Thank you allowing the Chronic Care Management Team to be a part of your care! It was a pleasure speaking with you today!  1. Please call [facility] [phone] ext [phone] to initiate application process for assistance with needed medical screenings 2. Please call this social worker with any additional community resource needs, or assistance with [facility] application process.   CCM (Chronic Care Management) Team   Trish Fountain RN, BSN Nurse Care Coordinator  [phone]  Ruben Reason PharmD  Clinical Pharmacist  [phone]   Elliot Gurney, LCSW Clinical Social Worker [phone]  Goals Addressed            This Visit's Progress   . "I do not have insurance, I need help with getting a colonoscopy" (pt-stated)       Current Barriers:  . Financial constraints . Lacks knowledge of community resource: community healthcare  Clinical Social Work Clinical Goal(s):  Marland Kitchen Over the next 90 days, client will work with SW to address concerns related to community health care  Interventions: . Patient interviewed and appropriate assessments performed . Confirmed that patient is currently uninsured and has been denied Medicaid twice in the last year . Confirmed with patient that he is currently unemployed and currently receives social security only . Provided patient with information about applying for [facility] [phone] ext [phone] for assistance with getting needed screenings done . Encouraged patient to call [facility] to complete the screening and paperwork required . Encouraged patient to call this social worker back with questions or assistance with paperwork process . Discussed plans with patient for ongoing care management follow up and provided patient with direct contact information for care management team  Patient Self Care Activities:  . Performs ADL's independently . Performs IADL's independently . Calls provider office for new concerns or  questions  Please see past updates related to this goal by clicking on the "Past Updates" button in the selected goal          The patient verbalized understanding of instructions provided today and declined a print copy of patient instruction materials.   The patient will call [facility] as advised to complete application process for assistance with medical screenings.

## 2019-06-30 ENCOUNTER — Ambulatory Visit: Payer: Self-pay | Admitting: *Deleted

## 2019-06-30 ENCOUNTER — Telehealth: Payer: Self-pay | Admitting: *Deleted

## 2019-06-30 NOTE — Chronic Care Management (AMB) (Signed)
   Care Management   Unsuccessful Call Note 06/30/2019 Name: Mark Carr MRN: [id number] DOB: [birth year]  Patient  is a 63 year old male who sees [name], MD for primary care. Dr. Brita Romp asked the CCM team to consult the patient for assistance in getting needed medical screenings.     This social worker was unable to reach patient via telephone today to follow up on request to contact [facility] for the application for financial assistance. I have left HIPAA compliant message with his mother asking for a  return my call. (unsuccessful outreach #1).   Plan: Will follow-up within 7 business days via telephone.      Elliot Gurney, [place] Worker  [facility] Care Management [phone]

## 2019-07-07 ENCOUNTER — Ambulatory Visit: Payer: Self-pay | Admitting: *Deleted

## 2019-07-07 ENCOUNTER — Telehealth: Payer: Self-pay | Admitting: *Deleted

## 2019-07-07 NOTE — Chronic Care Management (AMB) (Signed)
    Care Management   Unsuccessful Call Note 07/07/2019 Name: Mark Carr MRN: [id number] DOB: [birth year]   Patient  is a 63 year old malewho sees [name], MD for primary care. Dr. Charlesetta Shanks the CCM team to consult the patient for assistance in getting needed medical screenings.    This social worker was unable to reach patient via telephone today to follow up on request to contact [facility] for the application for financial assistance. Ihave left HIPAA compliant message with his mother asking for a  return my call. (unsuccessful outreach #2).    Plan: Will follow-up within 7 business days via telephone.      Elliot Gurney, [place] Worker  [facility] [phone]

## 2019-07-14 ENCOUNTER — Telehealth: Payer: Self-pay | Admitting: *Deleted

## 2019-07-14 ENCOUNTER — Ambulatory Visit: Payer: Self-pay | Admitting: *Deleted

## 2019-07-14 NOTE — Chronic Care Management (AMB) (Signed)
    Care Management   Unsuccessful Call Note 07/14/2019 Name: Mark Carr MRN: [id number] DOB: November 22, 1955  Patientis a 63year old [place], MDfor primary care. Dr. Charlesetta Shanks the CCM team to consult the patient forassistance in getting needed medical screenings.   This social worker was unable to reach patient via telephone todayto follow up on request to contact [facility] for the application for financial assistance. This social worker made 2 attempts to reach patient today.  Ihave left HIPAA compliantmessage requesting  areturn my call. (unsuccessful outreach #3).      Plan:  This Education officer, museum will make no further attempt to reach patient at this time. This social worker will be happy to engage patient upon his return call.     Elliot Gurney, [place] Worker  [facility] [phone]

## 2019-11-17 ENCOUNTER — Ambulatory Visit: Payer: Self-pay | Admitting: Family Medicine

## 2019-11-21 ENCOUNTER — Ambulatory Visit: Payer: Self-pay | Admitting: Family Medicine

## 2019-11-29 ENCOUNTER — Other Ambulatory Visit: Payer: Self-pay

## 2019-11-29 ENCOUNTER — Encounter: Payer: Self-pay | Admitting: Family Medicine

## 2019-11-29 ENCOUNTER — Ambulatory Visit (INDEPENDENT_AMBULATORY_CARE_PROVIDER_SITE_OTHER): Payer: Self-pay | Admitting: Family Medicine

## 2019-11-29 VITALS — BP 134/88 | HR 87 | Temp 97.3°F | Resp 16 | Ht 68.0 in | Wt 124.0 lb

## 2019-11-29 DIAGNOSIS — F1020 Alcohol dependence, uncomplicated: Secondary | ICD-10-CM

## 2019-11-29 DIAGNOSIS — Z598 Other problems related to housing and economic circumstances: Secondary | ICD-10-CM

## 2019-11-29 DIAGNOSIS — R634 Abnormal weight loss: Secondary | ICD-10-CM

## 2019-11-29 DIAGNOSIS — Z5989 Other problems related to housing and economic circumstances: Secondary | ICD-10-CM | POA: Insufficient documentation

## 2019-11-29 DIAGNOSIS — F17200 Nicotine dependence, unspecified, uncomplicated: Secondary | ICD-10-CM

## 2019-11-29 DIAGNOSIS — K760 Fatty (change of) liver, not elsewhere classified: Secondary | ICD-10-CM

## 2019-11-29 MED ORDER — NALTREXONE HCL 50 MG PO TABS: 50.0000 mg | ORAL_TABLET | Freq: Every day | ORAL | 3 refills | Status: DC

## 2019-11-29 NOTE — Assessment & Plan Note (Signed)
Discussed need to cut back on alcohol use Discussed problematic alcohol intake Discussed possible liver disease He is interested in cutting back at this time, but does not know how to We discussed naltrexone and patient will try this to cut his alcohol cravings and slowly cut back on his intake

## 2019-11-29 NOTE — Assessment & Plan Note (Signed)
Patient with possible 10 to 15 pound weight loss in the last 6 months His last weight was just a reported weight and not measured, so it is unclear to me if he is actually lost this weight He seems to skip meals around times where he is drinking more alcohol We are attempting to cut back and quit using alcohol as above We will continue to monitor his weight and he is told to follow-up more closely if he notices ongoing weight loss

## 2019-11-29 NOTE — Patient Instructions (Signed)
Alcohol Abuse and Dependence Information, Adult Alcohol is a widely available drug. People drink alcohol in different amounts. People who drink alcohol very often and in large amounts often have problems during and after drinking. They may develop what is called an alcohol use disorder. There are two main types of alcohol use disorders:  Alcohol abuse. This is when you use alcohol too much or too often. You may use alcohol to make yourself feel happy or to reduce stress. You may have a hard time setting a limit on the amount you drink.  Alcohol dependence. This is when you use alcohol consistently for a period of time, and your body changes as a result. This can make it hard to stop drinking because you may start to feel sick or feel different when you do not use alcohol. These symptoms are known as withdrawal. How can alcohol abuse and dependence affect me? Alcohol abuse and dependence can have a negative effect on your life. Drinking too much can lead to addiction. You may feel like you need alcohol to function normally. You may drink alcohol before work in the morning, during the day, or as soon as you get home from work in the evening. These actions can result in:  Poor work performance.  Job loss.  Financial problems.  Car crashes or criminal charges from driving after drinking alcohol.  Problems in your relationships with friends and family.  Losing the trust and respect of coworkers, friends, and family. Drinking heavily over a long period of time can permanently damage your body and brain, and can cause lifelong health issues, such as:  Damage to your liver or pancreas.  Heart problems, high blood pressure, or stroke.  Certain cancers.  Decreased ability to fight infections.  Brain or nerve damage.  Depression.  Early (premature) death. If you are careless or you crave alcohol, it is easy to drink more than your body can handle (overdose). Alcohol overdose is a serious  situation that requires hospitalization. It may lead to permanent injuries or death. What can increase my risk?  Having a family history of alcohol abuse.  Having depression or other mental health conditions.  Beginning to drink at an early age.  Binge drinking often.  Experiencing trauma, stress, and an unstable home life during childhood.  Spending time with people who drink often. What actions can I take to prevent or manage alcohol abuse and dependence?  Do not drink alcohol if: ? Your health care provider tells you not to drink. ? You are pregnant, may be pregnant, or are planning to become pregnant.  If you drink alcohol: ? Limit how much you use to:  0-1 drink a day for women.  0-2 drinks a day for men. ? Be aware of how much alcohol is in your drink. In the [place] one drink equals one 12 oz bottle of beer (355 mL), one 5 oz glass of wine (148 mL), or one 1 oz glass of hard liquor (44 mL).  Stop drinking if you have been drinking too much. This can be very hard to do if you are used to abusing alcohol. If you begin to have withdrawal symptoms, talk with your health care provider or a person that you trust. These symptoms may include anxiety, shaky hands, headache, nausea, sweating, or not being able to sleep.  Choose to drink nonalcoholic beverages in social gatherings and places where there may be alcohol. Activity  Spend more time on activities that you enjoy that do   not involve alcohol, like hobbies or exercise.  Find healthy ways to cope with stress, such as exercise, meditation, or spending time with people you care about. General information  Talk to your family, coworkers, and friends about supporting you in your efforts to stop drinking. If they drink, ask them not to drink around you. Spend more time with people who do not drink alcohol.  If you think that you have an alcohol dependency problem: ? Tell friends or family about your concerns. ? Talk with your  health care provider or another health professional about where to get help. ? Work with a therapist and a chemical dependency counselor. ? Consider joining a support group for people who struggle with alcohol abuse and dependence. Where to find support   Your health care provider.  SMART Recovery: www.smartrecovery.org Therapy and support groups  Local treatment centers or chemical dependency counselors.  Local AA groups in your community: www.aa.org Where to find more information  Centers for Disease Control and Prevention: www.cdc.gov  National Institute on Alcohol Abuse and Alcoholism: www.niaaa.nih.gov  Alcoholics Anonymous (AA): www.aa.org Contact a health care provider if:  You drank more or for longer than you intended on more than one occasion.  You tried to stop drinking or to cut back on how much you drink, but you were not able to.  You often drink to the point of vomiting or passing out.  You want to drink so badly that you cannot think about anything else.  You have problems in your life due to drinking, but you continue to drink.  You keep drinking even though you feel anxious, depressed, or have experienced memory loss.  You have stopped doing the things you used to enjoy in order to drink.  You have to drink more than you used to in order to get the effect you want.  You experience anxiety, sweating, nausea, shakiness, and trouble sleeping when you try to stop drinking. Get help right away if:  You have thoughts about hurting yourself or others.  You have serious withdrawal symptoms, including: ? Confusion. ? Racing heart. ? High blood pressure. ? Fever. If you ever feel like you may hurt yourself or others, or have thoughts about taking your own life, get help right away. You can go to your nearest emergency department or call:  Your local emergency services (911 in the [place]  A suicide crisis helpline, such as the National Suicide Prevention  Lifeline at 1-800-273-8255. This is open 24 hours a day. Summary  Alcohol abuse and dependence can have a negative effect on your life. Drinking too much or too often can lead to addiction.  If you drink alcohol, limit how much you use.  If you are having trouble keeping your drinking under control, find ways to change your behavior. Hobbies, calming activities, exercise, or support groups can help.  If you feel you need help with changing your drinking habits, talk with your health care provider, a good friend, or a therapist, or go to an AA group. This information is not intended to replace advice given to you by your health care provider. Make sure you discuss any questions you have with your health care provider. Document Revised: 02/14/2019 Document Reviewed: 01/03/2019 Elsevier Patient Education  2020 Elsevier Inc.  

## 2019-11-29 NOTE — Assessment & Plan Note (Signed)
Patient is uninsured and this affects his ability to afford medications and labs and other health care He may qualify for charity care I discussed this again with the patient today I printed off information about the charity care program through [facility] health I printed off an application for the charity care program for him to complete and mail back Advised him to let me know when he has charity care in place, and we will get his labs at that point

## 2019-11-29 NOTE — Assessment & Plan Note (Signed)
Noted on previous ultrasound Likely has some fatty liver We did discuss effects of alcohol use on the liver Want to recheck LFTs, but patient has been unable to get his labs from his last visit due to lack of insurance

## 2019-11-29 NOTE — Progress Notes (Signed)
Patient: Mark Carr Male    DOB: 10-06-1956   64 y.o.   MRN: [id number] Visit Date: 11/29/2019  Today's Provider: [name], MD   Chief Complaint  Patient presents with  . Nicotine Dependence  . Steatosis of liver   Subjective:     HPI   Follow up for steatosis of liver  The patient was last seen for this 6 months ago. Changes made at last visit include check labs and cut back on drinking.  He reports fair compliance with treatment. Patient has not had labs done. He feels that condition is Unchanged.  ------------------------------------------------------------------------------------ Cut back from 1ppd to 1/2 ppd of smoking.  Doesn't feel cravings like he used to.  He is working on cutting back and slowly working toward quitting.  He has been unable to get labs due to cost as he is uninsured.  CCM team did reach out to him and get him a charity care application for [facility].  He has misplaced this, but is interested in getting another one to work on getting charity care assistance.  His fatigue has improved.  He is working most days now.  He has not noticed any weight loss, but he thought his weight was previously around 135 and he weighs 124 today.  His appetite is good, but he does miss meals sometimes.  He states that when he is drinking more, this is when he misses more meals.  He continues to drink a little more than one 40 ounce beer per day.  He would like to quit drinking, but he does not know how as he has been doing it for so long.  He has no history of withdrawals, DTs, seizures.   No Known Allergies  No current outpatient medications on file.  Review of Systems  Constitutional: Negative for activity change, appetite change, chills, fatigue and fever.  Respiratory: Negative for cough, chest tightness and shortness of breath.   Cardiovascular: Negative for chest pain and palpitations.  Gastrointestinal: Negative for abdominal pain, constipation,  diarrhea, nausea and vomiting.    Social History   Tobacco Use  . Smoking status: Current Every Day Smoker    Packs/day: 0.50    Years: 40.00    Pack years: 20.00    Types: Cigarettes  . Smokeless tobacco: Never Used  Substance Use Topics  . Alcohol use: Yes    Alcohol/week: 1.0 standard drinks    Types: 1 Cans of beer per week    Comment: 1 40 oz beer per day      Objective:   BP 134/88 (BP Location: Left Arm, Patient Position: Sitting, Cuff Size: Normal)   Pulse 87   Temp (!) 97.3 F (36.3 C) (Temporal)   Resp 16   Ht 5\' 8"  (1.727 m)   Wt 124 lb (56.2 kg)   BMI 18.85 kg/m  Vitals:   11/29/19 1029  BP: 134/88  Pulse: 87  Resp: 16  Temp: (!) 97.3 F (36.3 C)  TempSrc: Temporal  Weight: 124 lb (56.2 kg)  Height: 5\' 8"  (1.727 m)  Body mass index is 18.85 kg/m.   Physical Exam Vitals reviewed.  Constitutional:      General: He is not in acute distress.    Appearance: He is not diaphoretic.     Comments: Gaunt appearance  HENT:     Head: Normocephalic and atraumatic.  Eyes:     General: No scleral icterus.    Conjunctiva/sclera: Conjunctivae normal.  Cardiovascular:  Rate and Rhythm: Normal rate and regular rhythm.     Pulses: Normal pulses.     Heart sounds: Normal heart sounds. No murmur.  Pulmonary:     Effort: Pulmonary effort is normal. No respiratory distress.     Breath sounds: Normal breath sounds. No wheezing.  Abdominal:     General: There is no distension.     Palpations: Abdomen is soft.     Tenderness: There is no abdominal tenderness.  Musculoskeletal:     Cervical back: Neck supple.     Right lower leg: No edema.     Left lower leg: No edema.  Lymphadenopathy:     Cervical: No cervical adenopathy.  Skin:    General: Skin is warm and dry.     Findings: No rash.  Neurological:     Mental Status: He is alert and oriented to person, place, and time. Mental status is at baseline.  Psychiatric:        Mood and Affect: Mood normal.         Behavior: Behavior normal.      No results found for any visits on 11/29/19.     Assessment & Plan    Problem List Items Addressed This Visit      Digestive   Steatosis of liver    Noted on previous ultrasound Likely has some fatty liver We did discuss effects of alcohol use on the liver Want to recheck LFTs, but patient has been unable to get his labs from his last visit due to lack of insurance        Other   Tobacco use disorder - Primary    Again discussed the importance of smoking cessation and health risks of continuing to smoke He is attempting to cut back and I congratulated him on cutting back from 1 to half pack per day      Alcohol use    Discussed need to cut back on alcohol use Discussed problematic alcohol intake Discussed possible liver disease He is interested in cutting back at this time, but does not know how to We discussed naltrexone and patient will try this to cut his alcohol cravings and slowly cut back on his intake      Uninsured    Patient is uninsured and this affects his ability to afford medications and labs and other health care He may qualify for charity care I discussed this again with the patient today I printed off information about the charity care program through [facility] health I printed off an application for the charity care program for him to complete and mail back Advised him to let me know when he has charity care in place, and we will get his labs at that point      Unintentional weight loss    Patient with possible 10 to 15 pound weight loss in the last 6 months His last weight was just a reported weight and not measured, so it is unclear to me if he is actually lost this weight He seems to skip meals around times where he is drinking more alcohol We are attempting to cut back and quit using alcohol as above We will continue to monitor his weight and he is told to follow-up more closely if he notices ongoing weight loss           Return in about 6 months (around 05/28/2020) for chronic disease f/u.   The entirety of the information documented in the History of  Present Illness, Review of Systems and Physical Exam were personally obtained by me. Portions of this information were initially documented by Lynford Humphrey, CMA and reviewed by me for thoroughness and accuracy.    Hyun Marsalis, Dionne Bucy, MD MPH [facility]

## 2019-11-29 NOTE — Assessment & Plan Note (Signed)
Again discussed the importance of smoking cessation and health risks of continuing to smoke He is attempting to cut back and I congratulated him on cutting back from 1 to half pack per day

## 2020-05-28 ENCOUNTER — Ambulatory Visit: Payer: Self-pay | Admitting: Family Medicine

## 2021-04-01 ENCOUNTER — Inpatient Hospital Stay
Admission: EM | Admit: 2021-04-01 | Discharge: 2021-04-03 | DRG: 371 | Disposition: A | Discharge status: Home / Self Care | Payer: Self-pay | Attending: Internal Medicine | Admitting: Internal Medicine

## 2021-04-01 ENCOUNTER — Observation Stay: Payer: Self-pay

## 2021-04-01 ENCOUNTER — Other Ambulatory Visit: Payer: Self-pay

## 2021-04-01 ENCOUNTER — Emergency Department: Payer: Self-pay

## 2021-04-01 DIAGNOSIS — Z7141 Alcohol abuse counseling and surveillance of alcoholic: Secondary | ICD-10-CM

## 2021-04-01 DIAGNOSIS — F1010 Alcohol abuse, uncomplicated: Secondary | ICD-10-CM | POA: Diagnosis present

## 2021-04-01 DIAGNOSIS — R531 Weakness: Secondary | ICD-10-CM

## 2021-04-01 DIAGNOSIS — W19XXXA Unspecified fall, initial encounter: Secondary | ICD-10-CM

## 2021-04-01 DIAGNOSIS — Z789 Other specified health status: Secondary | ICD-10-CM

## 2021-04-01 DIAGNOSIS — E872 Acidosis: Secondary | ICD-10-CM | POA: Diagnosis present

## 2021-04-01 DIAGNOSIS — Z20822 Contact with and (suspected) exposure to covid-19: Secondary | ICD-10-CM | POA: Diagnosis present

## 2021-04-01 DIAGNOSIS — N390 Urinary tract infection, site not specified: Secondary | ICD-10-CM | POA: Diagnosis present

## 2021-04-01 DIAGNOSIS — Z7289 Other problems related to lifestyle: Secondary | ICD-10-CM

## 2021-04-01 DIAGNOSIS — Z716 Tobacco abuse counseling: Secondary | ICD-10-CM

## 2021-04-01 DIAGNOSIS — F17200 Nicotine dependence, unspecified, uncomplicated: Secondary | ICD-10-CM

## 2021-04-01 DIAGNOSIS — Z23 Encounter for immunization: Secondary | ICD-10-CM

## 2021-04-01 DIAGNOSIS — E43 Unspecified severe protein-calorie malnutrition: Secondary | ICD-10-CM | POA: Diagnosis present

## 2021-04-01 DIAGNOSIS — E876 Hypokalemia: Secondary | ICD-10-CM | POA: Diagnosis present

## 2021-04-01 DIAGNOSIS — Y92009 Unspecified place in unspecified non-institutional (private) residence as the place of occurrence of the external cause: Secondary | ICD-10-CM

## 2021-04-01 DIAGNOSIS — N179 Acute kidney failure, unspecified: Secondary | ICD-10-CM

## 2021-04-01 DIAGNOSIS — Z79891 Long term (current) use of opiate analgesic: Secondary | ICD-10-CM

## 2021-04-01 DIAGNOSIS — R197 Diarrhea, unspecified: Secondary | ICD-10-CM

## 2021-04-01 DIAGNOSIS — F17210 Nicotine dependence, cigarettes, uncomplicated: Secondary | ICD-10-CM | POA: Diagnosis present

## 2021-04-01 DIAGNOSIS — E871 Hypo-osmolality and hyponatremia: Secondary | ICD-10-CM | POA: Diagnosis present

## 2021-04-01 DIAGNOSIS — Z681 Body mass index (BMI) 19 or less, adult: Secondary | ICD-10-CM

## 2021-04-01 DIAGNOSIS — A020 Salmonella enteritis: Principal | ICD-10-CM | POA: Diagnosis present

## 2021-04-01 DIAGNOSIS — Z7151 Drug abuse counseling and surveillance of drug abuser: Secondary | ICD-10-CM

## 2021-04-01 DIAGNOSIS — K7460 Unspecified cirrhosis of liver: Secondary | ICD-10-CM | POA: Diagnosis present

## 2021-04-01 HISTORY — DX: Unspecified cirrhosis of liver: K74.60

## 2021-04-01 LAB — BASIC METABOLIC PANEL
Anion gap: 24 — ABNORMAL HIGH (ref 5–15)
BUN: 76 mg/dL — ABNORMAL HIGH (ref 8–23)
CO2: 19 mmol/L — ABNORMAL LOW (ref 22–32)
Calcium: 8.8 mg/dL — ABNORMAL LOW (ref 8.9–10.3)
Chloride: 80 mmol/L — ABNORMAL LOW (ref 98–111)
Creatinine, Ser: 4.73 mg/dL — ABNORMAL HIGH (ref 0.61–1.24)
GFR, Estimated: 13 mL/min — ABNORMAL LOW (ref >60)
Glucose, Bld: 150 mg/dL — ABNORMAL HIGH (ref 70–99)
Potassium: 3.5 mmol/L (ref 3.5–5.1)
Sodium: 123 mmol/L — ABNORMAL LOW (ref 135–145)

## 2021-04-01 LAB — URINALYSIS, COMPLETE (UACMP) WITH MICROSCOPIC
Bilirubin Urine: NEGATIVE
Glucose, UA: 50 mg/dL — AB
Ketones, ur: NEGATIVE mg/dL
Nitrite: NEGATIVE
Protein, ur: NEGATIVE mg/dL
Specific Gravity, Urine: 1.012 (ref 1.005–1.030)
pH: 5 (ref 5.0–8.0)

## 2021-04-01 LAB — HEPATIC FUNCTION PANEL
ALT: 25 U/L (ref 0–44)
AST: 52 U/L — ABNORMAL HIGH (ref 15–41)
Albumin: 4.7 g/dL (ref 3.5–5.0)
Alkaline Phosphatase: 70 U/L (ref 38–126)
Bilirubin, Direct: 0.2 mg/dL (ref 0.0–0.2)
Indirect Bilirubin: 0.7 mg/dL (ref 0.3–0.9)
Total Bilirubin: 0.9 mg/dL (ref 0.3–1.2)
Total Protein: 9.8 g/dL — ABNORMAL HIGH (ref 6.5–8.1)

## 2021-04-01 LAB — URINE DRUG SCREEN, QUALITATIVE (ARMC ONLY)
Amphetamines, Ur Screen: NOT DETECTED
Barbiturates, Ur Screen: NOT DETECTED
Benzodiazepine, Ur Scrn: NOT DETECTED
Cannabinoid 50 Ng, Ur ~~LOC~~: NOT DETECTED
Cocaine Metabolite,Ur ~~LOC~~: POSITIVE — AB
MDMA (Ecstasy)Ur Screen: NOT DETECTED
Methadone Scn, Ur: NOT DETECTED
Opiate, Ur Screen: NOT DETECTED
Phencyclidine (PCP) Ur S: NOT DETECTED
Tricyclic, Ur Screen: NOT DETECTED

## 2021-04-01 LAB — CBC
HCT: 45.9 % (ref 39.0–52.0)
Hemoglobin: 17.1 g/dL — ABNORMAL HIGH (ref 13.0–17.0)
MCH: 33.5 pg (ref 26.0–34.0)
MCHC: 37.3 g/dL — ABNORMAL HIGH (ref 30.0–36.0)
MCV: 89.8 fL (ref 80.0–100.0)
Platelets: 149 10*3/uL — ABNORMAL LOW (ref 150–400)
RBC: 5.11 MIL/uL (ref 4.22–5.81)
RDW: 11.9 % (ref 11.5–15.5)
WBC: 4.7 10*3/uL (ref 4.0–10.5)
nRBC: 0 % (ref 0.0–0.2)

## 2021-04-01 LAB — RESP PANEL BY RT-PCR (FLU A&B, COVID) ARPGX2
Influenza A by PCR: NEGATIVE
Influenza B by PCR: NEGATIVE
SARS Coronavirus 2 by RT PCR: NEGATIVE

## 2021-04-01 LAB — OSMOLALITY, URINE: Osmolality, Ur: 388 mOsm/kg (ref 300–900)

## 2021-04-01 LAB — APTT: aPTT: 33 seconds (ref 24–36)

## 2021-04-01 LAB — AMMONIA: Ammonia: 18 umol/L (ref 9–35)

## 2021-04-01 LAB — OSMOLALITY: Osmolality: 286 mOsm/kg (ref 275–295)

## 2021-04-01 LAB — PROTIME-INR
INR: 1.2 (ref 0.8–1.2)
Prothrombin Time: 14.8 seconds (ref 11.4–15.2)

## 2021-04-01 LAB — SODIUM, URINE, RANDOM: Sodium, Ur: 15 mmol/L

## 2021-04-01 LAB — PHOSPHORUS: Phosphorus: 11.1 mg/dL — ABNORMAL HIGH (ref 2.5–4.6)

## 2021-04-01 LAB — HIV ANTIBODY (ROUTINE TESTING W REFLEX): HIV Screen 4th Generation wRfx: NONREACTIVE

## 2021-04-01 LAB — MAGNESIUM: Magnesium: 2.9 mg/dL — ABNORMAL HIGH (ref 1.7–2.4)

## 2021-04-01 IMAGING — US US RENAL
1 series · 15 of 25 positions shown · non-contrast
Comparison: [DATE] CT without contrast

CLINICAL DATA: Acute kidney injury

EXAM:
RENAL / URINARY TRACT ULTRASOUND COMPLETE

[Series 1: us renal · 15 of 30 slices shown]
[im 1/30]
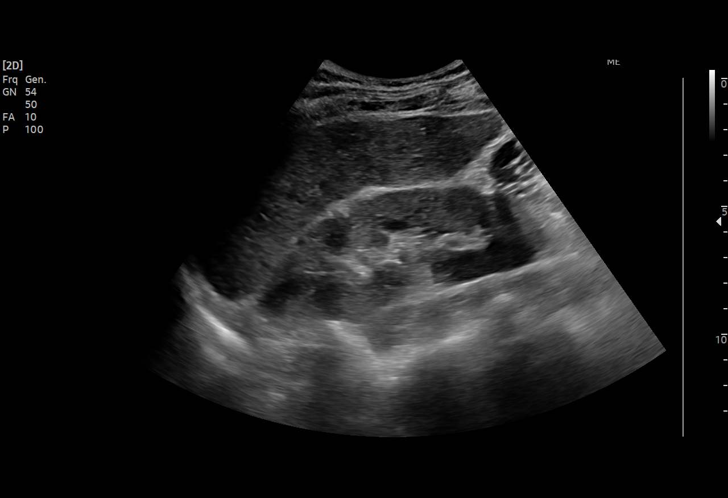
[im 3/30]
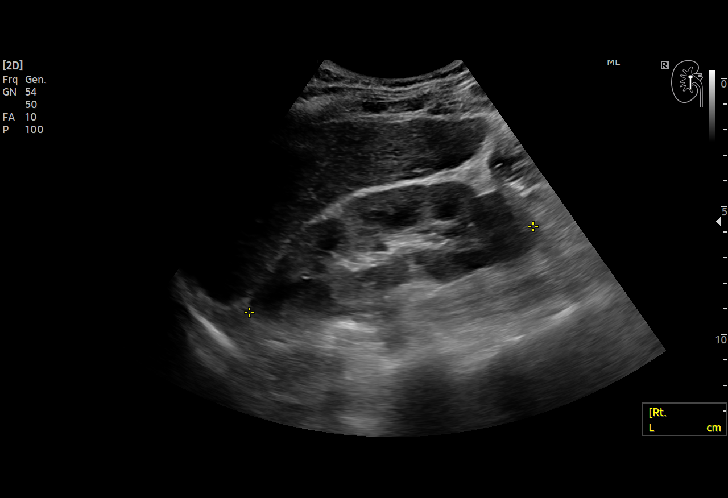
[im 5/30]
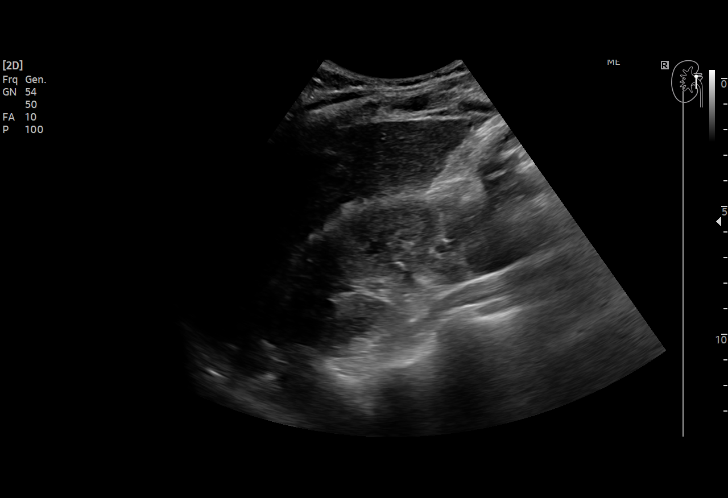
[im 7/30]
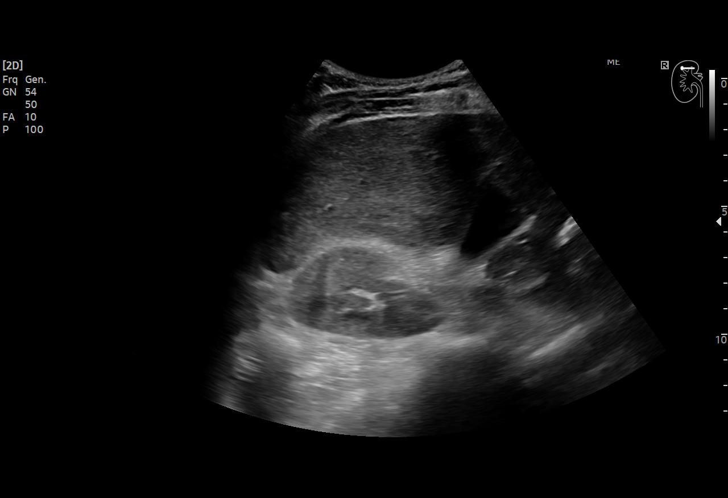
[im 9/30]
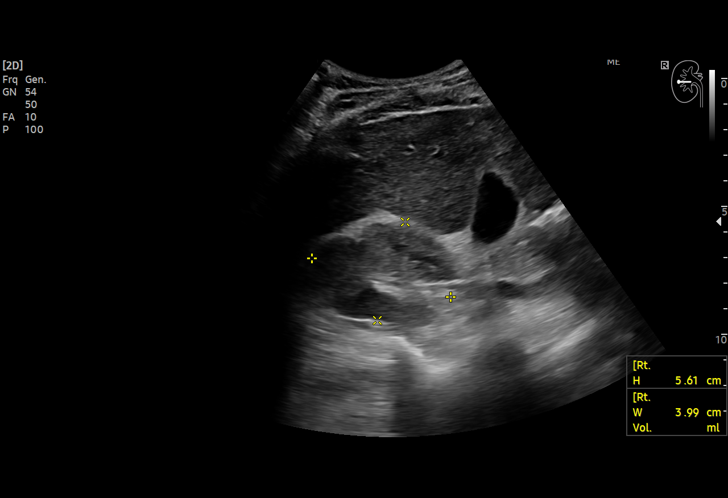
[im 11/30]
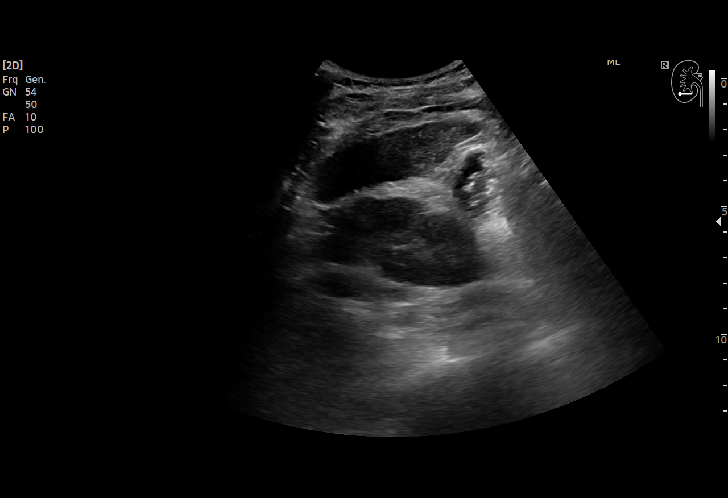
[im 13/30]
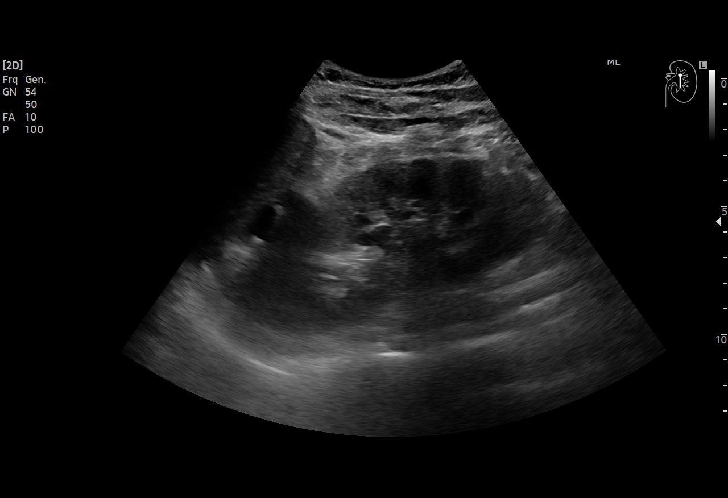
[im 15/30]
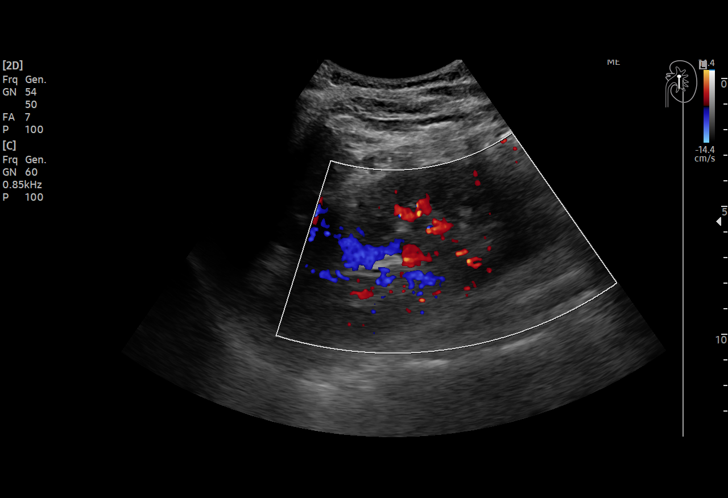
[im 17/30]
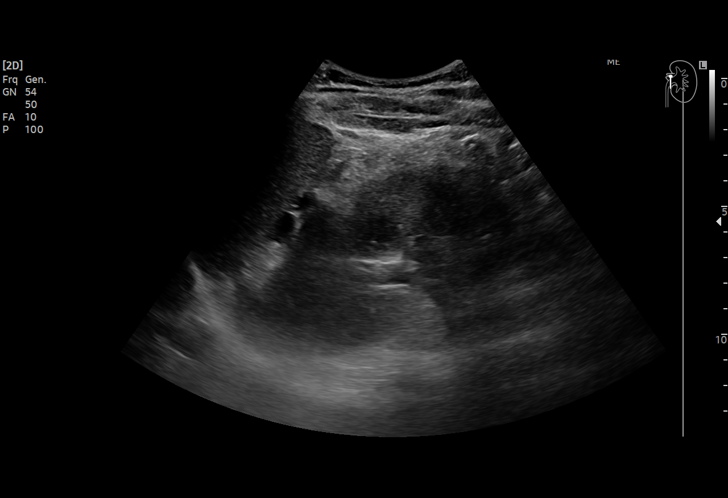
[im 19/30]
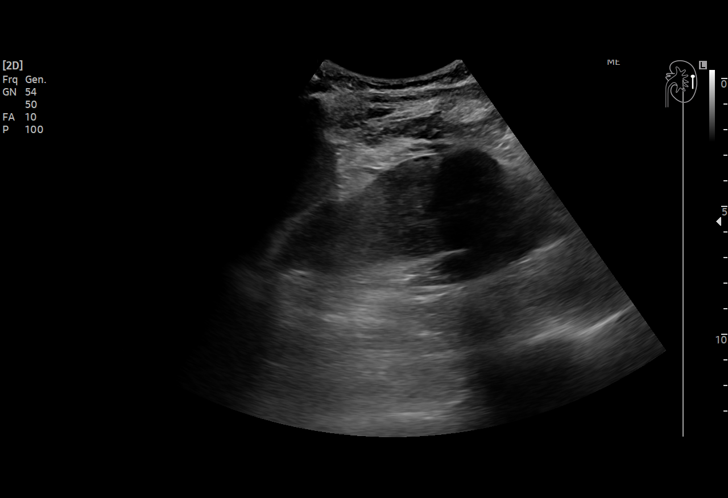
[im 21/30]
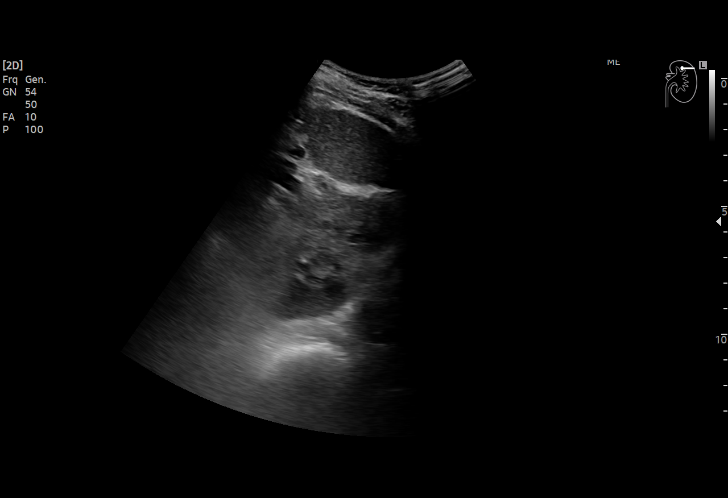
[im 23/30]
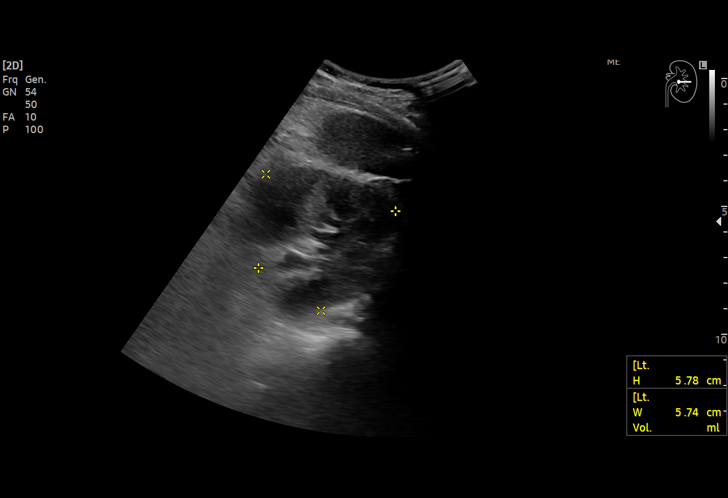
[im 25/30]
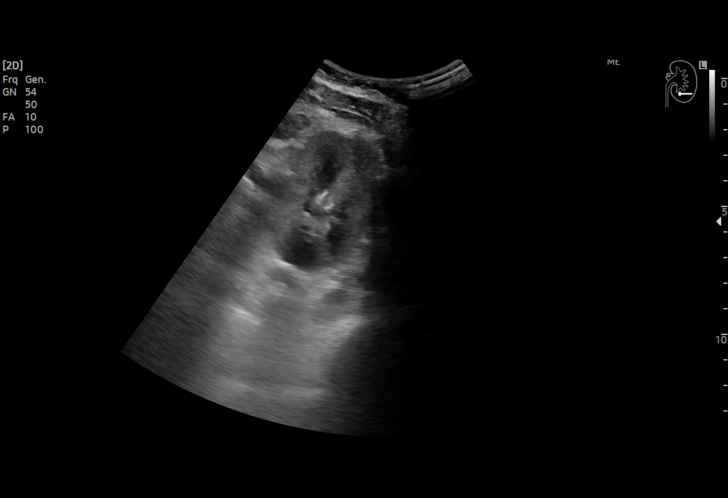
[im 27/30]
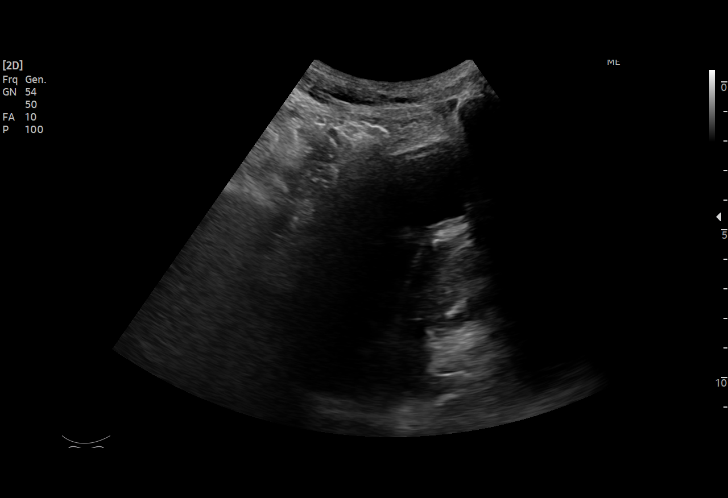
[im 30/30]
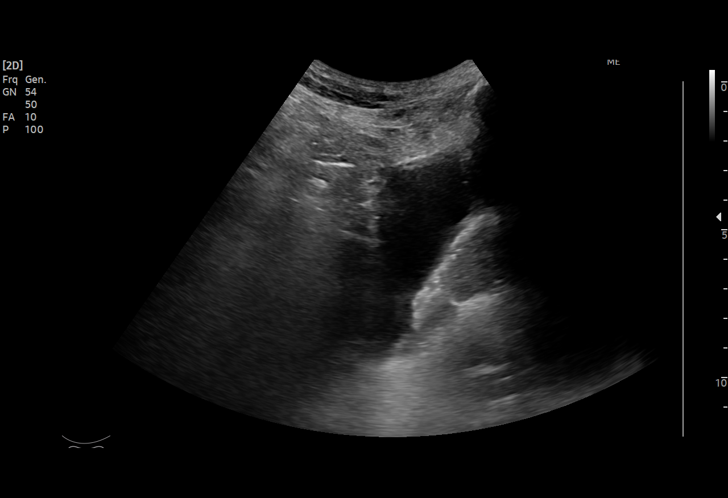

[15 of 25 positions shown; findings below may reference images not displayed]

FINDINGS: Right Kidney:

Renal measurements: 11.6 x 5.6 x 4.0 cm = volume: 135 mL. Increased
cortical echogenicity. Prominent medullary pyramids. No
hydronephrosis. No focal abnormality.

Left Kidney:

Renal measurements: 12.8 x 5.8 x 5.7 cm = volume: 222 mL. Slight
increased echogenicity. No focal abnormality, acute finding or
hydronephrosis.

Bladder:

Appears normal for degree of bladder distention.

Other:

No free fluid or ascites
IMPRESSION: Slight increased renal echogenicity compatible with medical renal
disease. No acute finding or hydronephrosis.

## 2021-04-01 IMAGING — CT CT HEAD W/O CM
4 series · 17 of 47 positions shown, 19 images · non-contrast
Comparison: [DATE]

CLINICAL DATA: Recent fall with altered mental status, initial
encounter

EXAM:
CT HEAD WITHOUT CONTRAST
TECHNIQUE: Contiguous axial images were obtained from the base of the skull
through the vertex without intravenous contrast.

[Series 2: head bone · axial · 0.42mm/px · z∈[-87,-35]mm · 4 of 75 slices shown]
[im 8/75  bone]
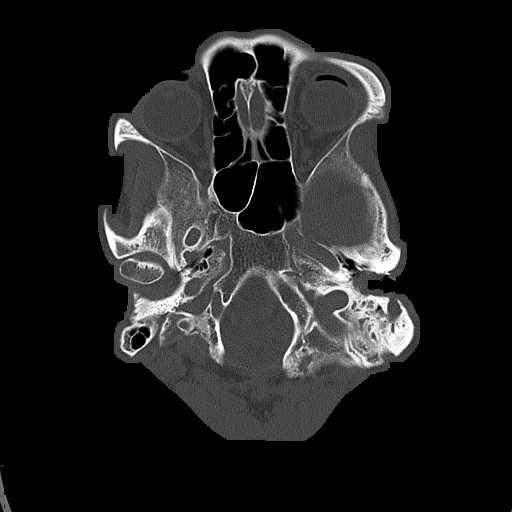
[im 15/75  bone]
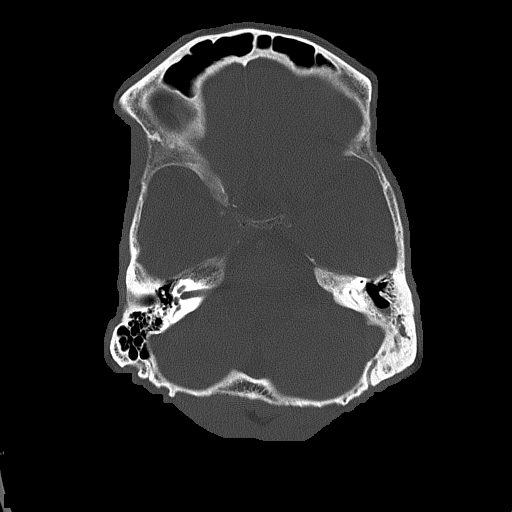
[im 23/75  bone]
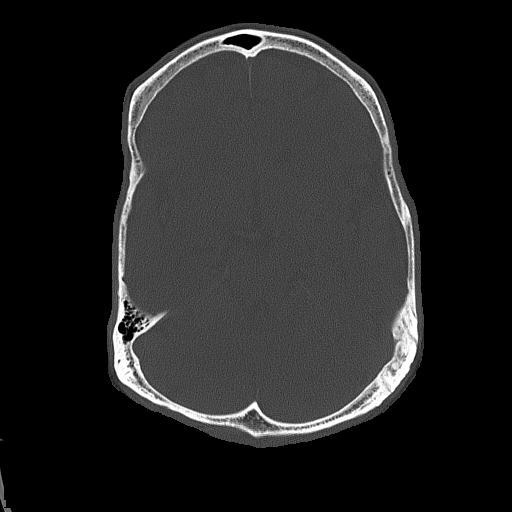
[im 34/75  bone]
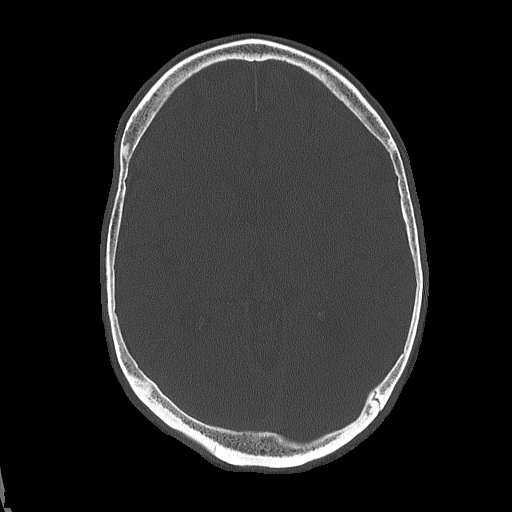

[Series 3: head wo · axial · 0.42mm/px · z∈[-86,+24]mm · 7 of 30 slices shown, 9 images]
[im 4/30  brain]
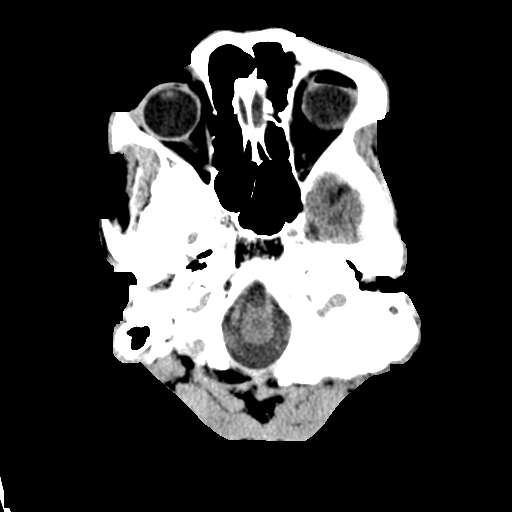
[im 4/30  bone]
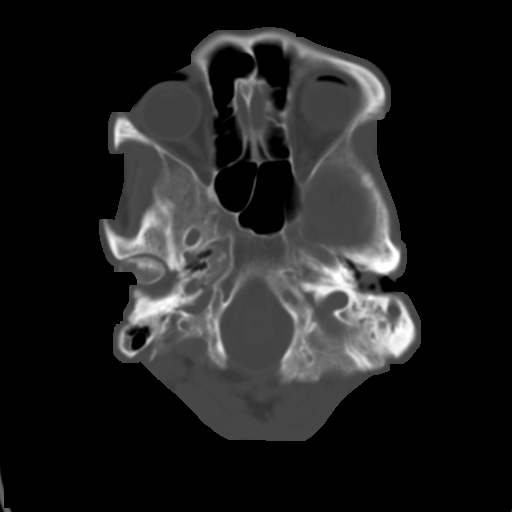
[im 8/30  brain]
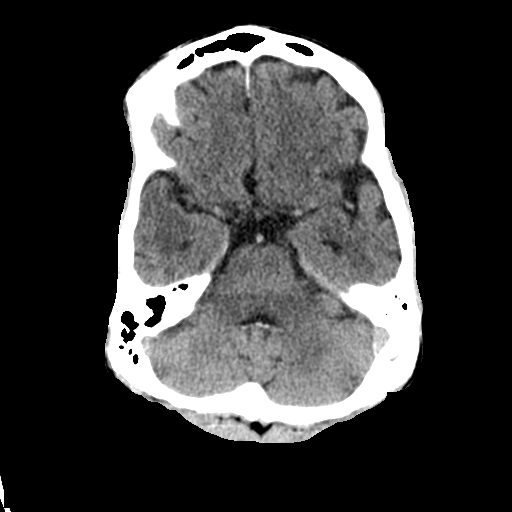
[im 11/30  brain]
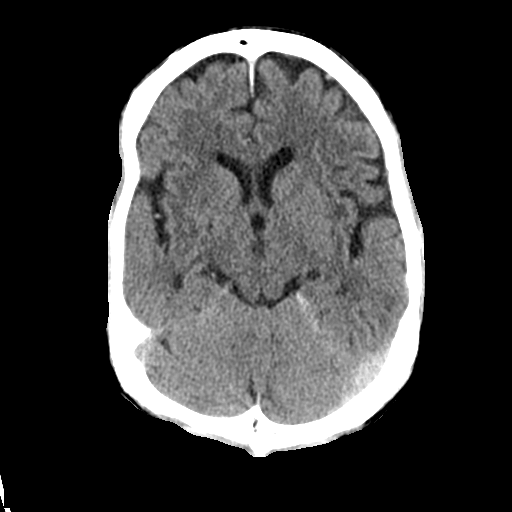
[im 15/30  brain]
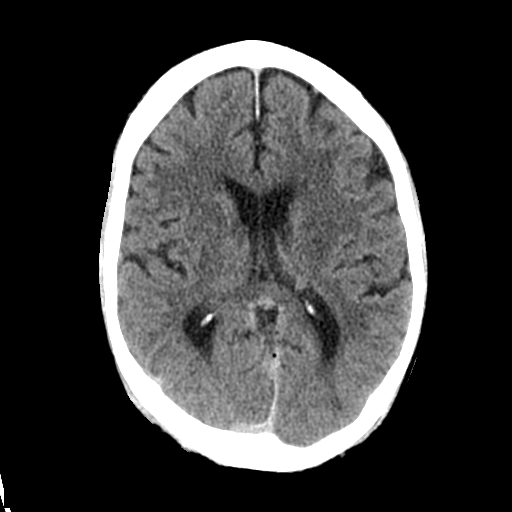
[im 19/30  brain]
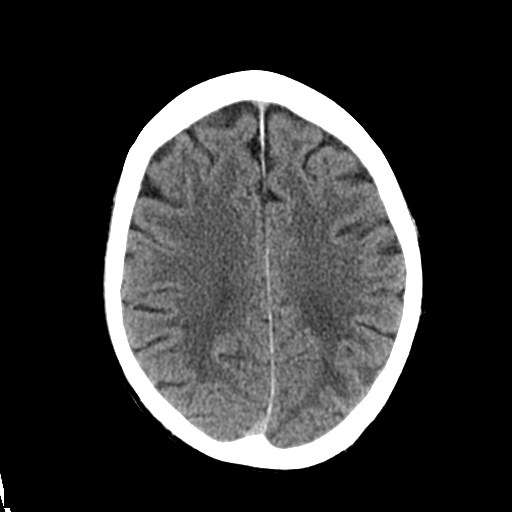
[im 19/30  bone]
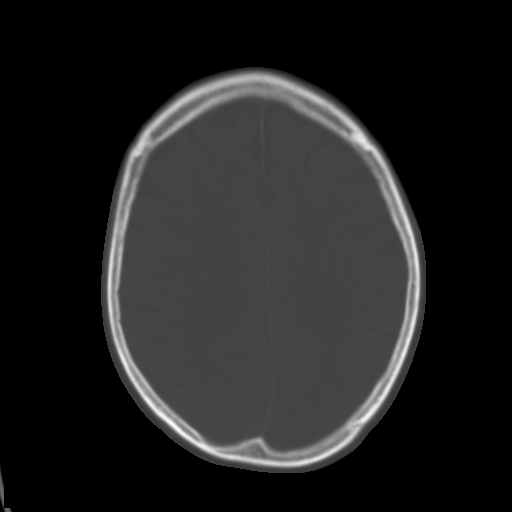
[im 22/30  brain]
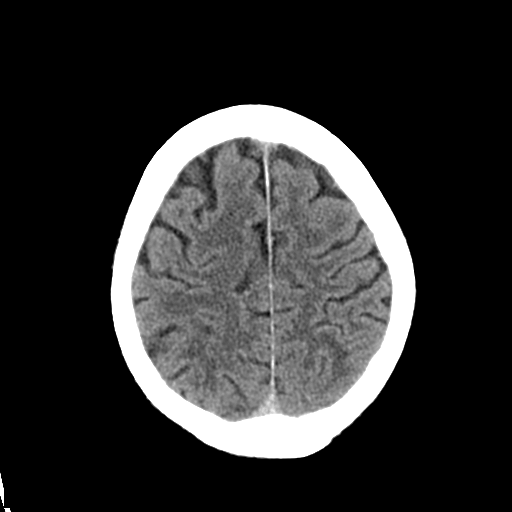
[im 26/30  brain]
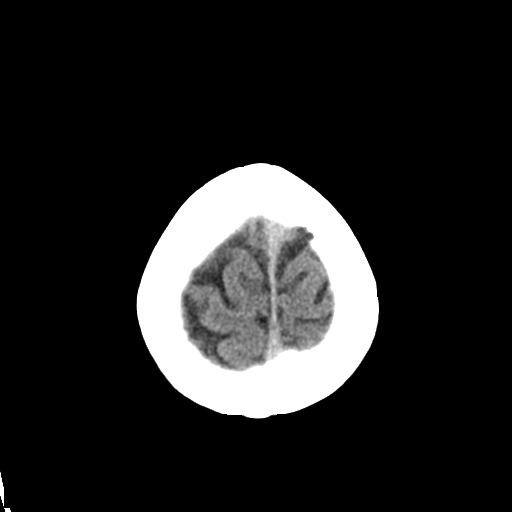

[Series 4: coronal soft tissue · coronal · 0.30mm/px · 3 of 67 slices shown]
[im 23/67  brain]
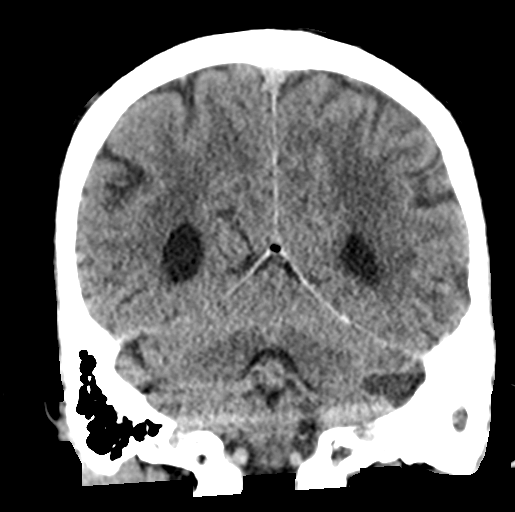
[im 30/67  brain]
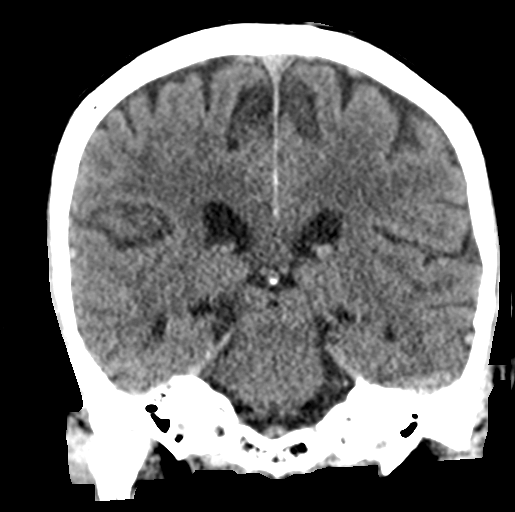
[im 37/67  brain]
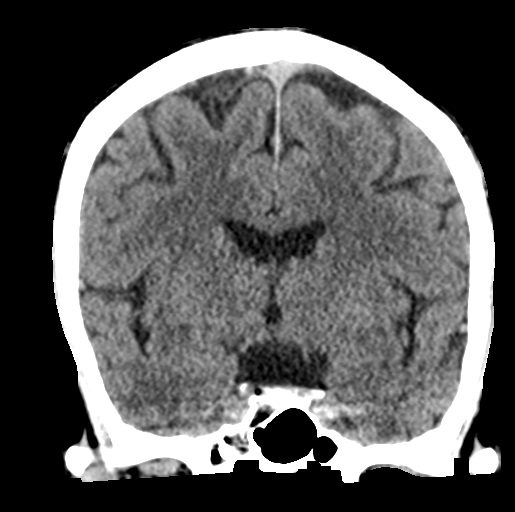

[Series 5: sagittal soft tissue · sagittal · 0.30mm/px · 3 of 53 slices shown]
[im 18/53  brain]
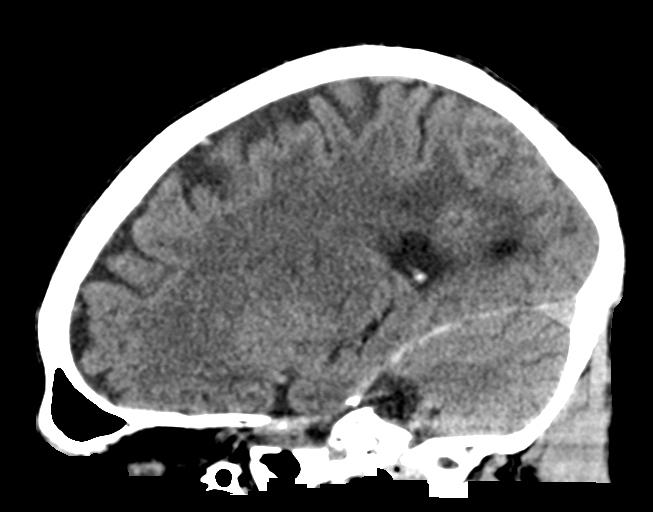
[im 27/53  brain]
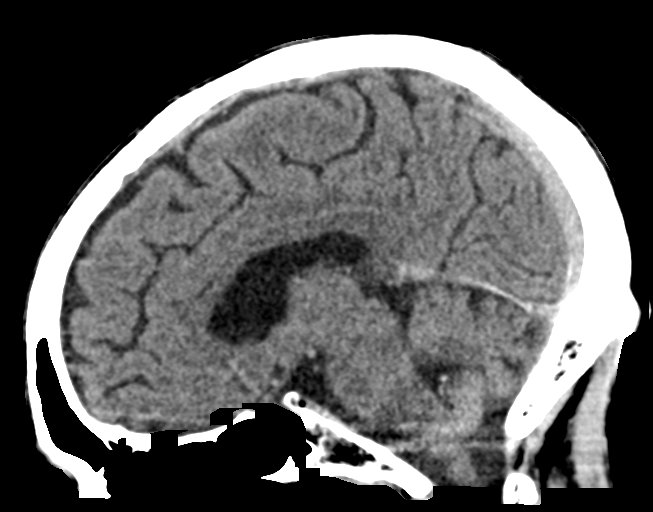
[im 35/53  brain]
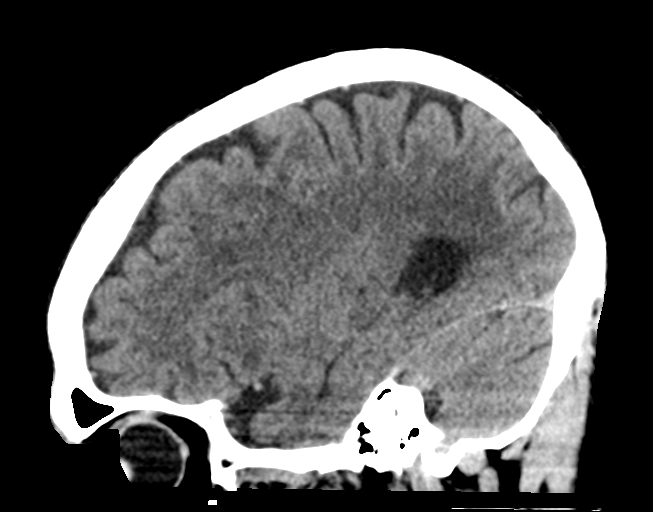

[17 of 47 positions shown; findings below may reference images not displayed]

FINDINGS: Brain: No evidence of acute infarction, hemorrhage, hydrocephalus,
extra-axial collection or mass lesion/mass effect. Small lipomas are
noted along the tentorium.

Vascular: No hyperdense vessel or unexpected calcification.

Skull: Normal. Negative for fracture or focal lesion.

Sinuses/Orbits: No acute finding.

Other: None.
IMPRESSION: No acute intracranial abnormality noted.

## 2021-04-01 MED ORDER — LORAZEPAM 2 MG/ML IJ SOLN: 0.0000 mg | Freq: Two times a day (BID) | INTRAMUSCULAR | Status: DC

## 2021-04-01 MED ORDER — SODIUM CHLORIDE 0.9 % IV BOLUS
1000.0000 mL | Freq: Once | INTRAVENOUS | Status: AC
  Administered 2021-04-01: 1000 mL via INTRAVENOUS

## 2021-04-01 MED ORDER — THIAMINE HCL 100 MG/ML IJ SOLN
100.0000 mg | Freq: Every day | INTRAMUSCULAR | Status: DC
  Administered 2021-04-01: 100 mg via INTRAVENOUS

## 2021-04-01 MED ORDER — ONDANSETRON HCL 4 MG/2ML IJ SOLN: 4.0000 mg | Freq: Three times a day (TID) | INTRAMUSCULAR | Status: DC | PRN

## 2021-04-01 MED ORDER — ADULT MULTIVITAMIN W/MINERALS CH
1.0000 | ORAL_TABLET | Freq: Every day | ORAL | Status: DC
  Administered 2021-04-01 – 2021-04-03 (×3): 1 via ORAL
  Filled 2021-04-01 (×3): qty 1

## 2021-04-01 MED ORDER — ENOXAPARIN SODIUM 30 MG/0.3ML IJ SOSY
30.0000 mg | PREFILLED_SYRINGE | INTRAMUSCULAR | Status: DC
  Administered 2021-04-01 – 2021-04-02 (×2): 30 mg via SUBCUTANEOUS
  Filled 2021-04-01 (×3): qty 0.3

## 2021-04-01 MED ORDER — THIAMINE HCL 100 MG/ML IJ SOLN
100.0000 mg | Freq: Once | INTRAMUSCULAR | Status: DC
  Filled 2021-04-01: qty 2

## 2021-04-01 MED ORDER — DEXTROSE-NACL 5-0.9 % IV SOLN: INTRAVENOUS | Status: DC

## 2021-04-01 MED ORDER — FOLIC ACID 1 MG PO TABS
1.0000 mg | ORAL_TABLET | Freq: Every day | ORAL | Status: DC
  Administered 2021-04-01 – 2021-04-03 (×3): 1 mg via ORAL
  Filled 2021-04-01 (×3): qty 1

## 2021-04-01 MED ORDER — DEXTROSE-NACL 5-0.45 % IV SOLN
1000.0000 mL | Freq: Once | INTRAVENOUS | Status: DC
  Filled 2021-04-01: qty 1000

## 2021-04-01 MED ORDER — PNEUMOCOCCAL VAC POLYVALENT 25 MCG/0.5ML IJ INJ
0.5000 mL | INJECTION | INTRAMUSCULAR | Status: AC
  Administered 2021-04-03: 12:00:00 0.5 mL via INTRAMUSCULAR
  Filled 2021-04-01: qty 0.5

## 2021-04-01 MED ORDER — LORAZEPAM 2 MG/ML IJ SOLN: 0.0000 mg | Freq: Four times a day (QID) | INTRAMUSCULAR | Status: AC

## 2021-04-01 MED ORDER — FOLIC ACID 5 MG/ML IJ SOLN
1.0000 mg | Freq: Every day | INTRAMUSCULAR | Status: DC
  Filled 2021-04-01: qty 0.2

## 2021-04-01 MED ORDER — LORAZEPAM 2 MG/ML IJ SOLN: 1.0000 mg | INTRAMUSCULAR | Status: DC | PRN

## 2021-04-01 MED ORDER — THIAMINE HCL 100 MG PO TABS
100.0000 mg | ORAL_TABLET | Freq: Every day | ORAL | Status: DC
  Administered 2021-04-02 – 2021-04-03 (×2): 100 mg via ORAL
  Filled 2021-04-01 (×2): qty 1

## 2021-04-01 MED ORDER — NICOTINE 21 MG/24HR TD PT24
21.0000 mg | MEDICATED_PATCH | Freq: Every day | TRANSDERMAL | Status: DC
  Administered 2021-04-01 – 2021-04-03 (×3): 21 mg via TRANSDERMAL
  Filled 2021-04-01 (×3): qty 1

## 2021-04-01 MED ORDER — SODIUM CHLORIDE 0.9 % IV SOLN
1.0000 g | INTRAVENOUS | Status: DC
  Administered 2021-04-01 – 2021-04-03 (×3): 1 g via INTRAVENOUS
  Filled 2021-04-01 (×2): qty 10
  Filled 2021-04-01: qty 1
  Filled 2021-04-01: qty 10

## 2021-04-01 MED ORDER — LORAZEPAM 1 MG PO TABS
1.0000 mg | ORAL_TABLET | ORAL | Status: DC | PRN
Start: 2021-04-01 — End: 2021-04-03

## 2021-04-01 NOTE — ED Notes (Signed)
Admit MD at bedside

## 2021-04-01 NOTE — ED Notes (Signed)
Transport requested

## 2021-04-01 NOTE — ED Notes (Signed)
Pt taken to CT.

## 2021-04-01 NOTE — ED Notes (Signed)
Daughter at bedside . Pt given drink

## 2021-04-01 NOTE — ED Notes (Signed)
Message sent to floor nurse

## 2021-04-01 NOTE — ED Notes (Signed)
Lab adding on blood to blood already in lab.

## 2021-04-01 NOTE — ED Notes (Signed)
Pt up to toilet. Pt provided with gown at this time. Chux placed on pt's bed at this time.

## 2021-04-01 NOTE — ED Notes (Signed)
Pt ate all of his lunch. Pt in bed resting with no issues expressed. Call light in reach, bed low.

## 2021-04-01 NOTE — ED Notes (Signed)
Family at bedside. 

## 2021-04-01 NOTE — ED Notes (Signed)
Pt ambulatory to the restroom with standby assistance at this time. Pt had a BM.

## 2021-04-01 NOTE — ED Notes (Signed)
Report received from Sam, RN

## 2021-04-01 NOTE — H&P (Signed)
History and Physical    Mark Carr YPP:[id number] DOB: 09/30/1956 DOA: 04/01/2021  Referring MD/NP/PA:   PCP: Virginia Crews, MD   Patient coming from:  The patient is coming from home.  At baseline, pt is independent for most of ADL.        Chief Complaint: Generalized weakness, fall, diarrhea  HPI: Mark Carr is a 65 y.o. male with medical history significant of liver cirrhosis, alcohol abuse, tobacco abuse, who presents with generalized weakness, fall, diarrhea.  Patient states that he has been feeling weak in the past several days. He has dizziness and fell yesterday afternoon when he was walking at home.  He denies loss of consciousness.  Denies any injury.  No headache or neck pain.  Patient has mild dry cough, denies chest pain, shortness breath, fever or chills.  Patient states that he has been having nausea, vomiting, diarrhea in the past 4 days.  He has mild lower abdominal pain.  He states that he has had at least 8 times of watery diarrhea since yesterday.  He has had 4 times of nonbilious nonbloody vomiting.  He denies dysuria or burning on urination suprapubic abdominal discomfort.  No unilateral numbness or tingling in extremities.  No facial droop or slurred speech.  Patient states he has intermittent leg cramps.  ED Course: pt was found to have WBC 4.7, positive UDS for cocaine, positive urinalysis (cloudy appearance, moderate amount of leukocyte, many bacteria, WBC 21-50), pending COVID-19 PCR, ammonia level 18, AKI with creatinine 4.73, BUN 76 (baseline creatinine 0.53 on 03/28/2018), sodium 123, temperature normal, blood pressure 133/83, heart rate 86, RR 20, oxygen saturation 99% on room air.  CT head is negative for acute intracranial abnormalities.  Renal ultrasound is negative for stone or obstruction.  Patient is placed on MedSurg bed for observation.   Review of Systems:   General: no fevers, chills, no body weight gain, has poor appetite, has  fatigue HEENT: no blurry vision, hearing changes or sore throat Respiratory: no dyspnea, coughing, wheezing CV: no chest pain, no palpitations GI: has nausea, vomiting, lower abdominal pain, diarrhea, no constipation GU: no dysuria, burning on urination, increased urinary frequency, hematuria  Ext: no leg edema Neuro: no unilateral weakness, numbness, or tingling, no vision change or hearing loss. Has fall Skin: no rash, no skin tear. MSK: No muscle spasm, no deformity, no limitation of range of movement in spin Heme: No easy bruising.  Travel history: No recent long distant travel.  Allergy: No Known Allergies  Past Medical History:  Diagnosis Date  . Cirrhosis of liver ([place])   . Wrist fracture     Past Surgical History:  Procedure Laterality Date  . WRIST FRACTURE SURGERY Right 2016    Social History:  reports that he has been smoking cigarettes. He has a 20.00 pack-year smoking history. He has never used smokeless tobacco. He reports current alcohol use of about 4.0 standard drinks of alcohol per week. He reports previous drug use.  Family History:  Family History  Problem Relation Age of Onset  . Hypertension Mother   . Deep vein thrombosis Mother   . Cancer Sister        unknown type  . Heart disease Brother   . Cancer Maternal Grandmother      Prior to Admission medications   Medication Sig Start Date End Date Taking? Authorizing Provider  naltrexone (DEPADE) 50 MG tablet Take 1 tablet (50 mg total) by mouth daily. 11/29/19  Virginia Crews, MD    Physical Exam: Vitals:   04/01/21 1515 04/01/21 1530 04/01/21 1600 04/01/21 1612  BP: 94/69 114/84 113/81 113/81  Pulse: 84 82 80 80  Resp: 20 19 18    Temp:      SpO2: 95% 100% 94%   Weight:      Height:       General: Not in acute distress HEENT:       Eyes: PERRL, EOMI, no scleral icterus.       ENT: No discharge from the ears and nose, no pharynx injection, no tonsillar enlargement.        Neck: No  JVD, no bruit, no mass felt. Heme: No neck lymph node enlargement. Cardiac: S1/S2, RRR, No murmurs, No gallops or rubs. Respiratory: No rales, wheezing, rhonchi or rubs. GI: Soft, nondistended, nontender, no rebound pain, no organomegaly, BS present. GU: No hematuria Ext: No pitting leg edema bilaterally. 1+DP/PT pulse bilaterally. Musculoskeletal: No joint deformities, No joint redness or warmth, no limitation of ROM in spin. Skin: No rashes.  Neuro: Alert, oriented X3, cranial nerves II-XII grossly intact, moves all extremities normally. Psych: Patient is not psychotic, no suicidal or hemocidal ideation.  Labs on Admission: I have personally reviewed following labs and imaging studies  CBC: Recent Labs  Lab 04/01/21 0818  WBC 4.7  HGB 17.1*  HCT 45.9  MCV 89.8  PLT 546*   Basic Metabolic Panel: Recent Labs  Lab 04/01/21 0818  NA 123*  K 3.5  CL 80*  CO2 19*  GLUCOSE 150*  BUN 76*  CREATININE 4.73*  CALCIUM 8.8*  MG 2.9*  PHOS 11.1*   GFR: Estimated Creatinine Clearance: 12.7 mL/min (A) (by C-G formula based on SCr of 4.73 mg/dL (H)). Liver Function Tests: Recent Labs  Lab 04/01/21 0818  AST 52*  ALT 25  ALKPHOS 70  BILITOT 0.9  PROT 9.8*  ALBUMIN 4.7   No results for input(s): LIPASE, AMYLASE in the last 168 hours. Recent Labs  Lab 04/01/21 1102  AMMONIA 18   Coagulation Profile: No results for input(s): INR, PROTIME in the last 168 hours. Cardiac Enzymes: No results for input(s): CKTOTAL, CKMB, CKMBINDEX, TROPONINI in the last 168 hours. BNP (last 3 results) No results for input(s): PROBNP in the last 8760 hours. HbA1C: No results for input(s): HGBA1C in the last 72 hours. CBG: No results for input(s): GLUCAP in the last 168 hours. Lipid Profile: No results for input(s): CHOL, HDL, LDLCALC, TRIG, CHOLHDL, LDLDIRECT in the last 72 hours. Thyroid Function Tests: No results for input(s): TSH, T4TOTAL, FREET4, T3FREE, THYROIDAB in the last 72  hours. Anemia Panel: No results for input(s): VITAMINB12, FOLATE, FERRITIN, TIBC, IRON, RETICCTPCT in the last 72 hours. Urine analysis:    Component Value Date/Time   COLORURINE YELLOW (A) 04/01/2021 1355   APPEARANCEUR CLOUDY (A) 04/01/2021 1355   LABSPEC 1.012 04/01/2021 1355   PHURINE 5.0 04/01/2021 1355   GLUCOSEU 50 (A) 04/01/2021 1355   HGBUR MODERATE (A) 04/01/2021 [place] 04/01/2021 [place] 04/01/2021 1355   PROTEINUR NEGATIVE 04/01/2021 1355   NITRITE NEGATIVE 04/01/2021 1355   LEUKOCYTESUR MODERATE (A) 04/01/2021 1355   Sepsis Labs: @LABRCNTIP (procalcitonin:4,lacticidven:4) ) Recent Results (from the past 240 hour(s))  Resp Panel by RT-PCR (Flu A&B, Covid) Nasopharyngeal Swab     Status: None   Collection Time: 04/01/21  8:35 AM   Specimen: Nasopharyngeal Swab; Nasopharyngeal(NP) swabs in vial transport medium  Result Value Ref Range  Status   SARS Coronavirus 2 by RT PCR NEGATIVE NEGATIVE Final    Comment: (NOTE) SARS-CoV-2 target nucleic acids are NOT DETECTED.  The SARS-CoV-2 RNA is generally detectable in upper respiratory specimens during the acute phase of infection. The lowest concentration of SARS-CoV-2 viral copies this assay can detect is 138 copies/mL. A negative result does not preclude SARS-Cov-2 infection and should not be used as the sole basis for treatment or other patient management decisions. A negative result may occur with  improper specimen collection/handling, submission of specimen other than nasopharyngeal swab, presence of viral mutation(s) within the areas targeted by this assay, and inadequate number of viral copies(<138 copies/mL). A negative result must be combined with clinical observations, patient history, and epidemiological information. The expected result is Negative.  Fact Sheet for Patients:  EntrepreneurPulse.com.au  Fact Sheet for Healthcare Providers:   IncredibleEmployment.be  This test is no t yet approved or cleared by the [place] FDA and  has been authorized for detection and/or diagnosis of SARS-CoV-2 by FDA under an Emergency Use Authorization (EUA). This EUA will remain  in effect (meaning this test can be used) for the duration of the COVID-19 declaration under Section 564(b)(1) of the Act, 21 U.S.C.section 360bbb-3(b)(1), unless the authorization is terminated  or revoked sooner.       Influenza A by PCR NEGATIVE NEGATIVE Final   Influenza B by PCR NEGATIVE NEGATIVE Final    Comment: (NOTE) The Xpert Xpress SARS-CoV-2/FLU/RSV plus assay is intended as an aid in the diagnosis of influenza from Nasopharyngeal swab specimens and should not be used as a sole basis for treatment. Nasal washings and aspirates are unacceptable for Xpert Xpress SARS-CoV-2/FLU/RSV testing.  Fact Sheet for Patients: EntrepreneurPulse.com.au  Fact Sheet for Healthcare Providers: IncredibleEmployment.be  This test is not yet approved or cleared by the [place] FDA and has been authorized for detection and/or diagnosis of SARS-CoV-2 by FDA under an Emergency Use Authorization (EUA). This EUA will remain in effect (meaning this test can be used) for the duration of the COVID-19 declaration under Section 564(b)(1) of the Act, 21 U.S.C. section 360bbb-3(b)(1), unless the authorization is terminated or revoked.  Performed at [facility], [address] [place], [place] [address]      Radiological Exams on Admission: CT Head Wo Contrast  Result Date: 04/01/2021 CLINICAL DATA:  Recent fall with altered mental status, initial encounter EXAM: CT HEAD WITHOUT CONTRAST TECHNIQUE: Contiguous axial images were obtained from the base of the skull through the vertex without intravenous contrast. COMPARISON:  08/31/2007 FINDINGS: Brain: No evidence of acute infarction, hemorrhage,  hydrocephalus, extra-axial collection or mass lesion/mass effect. Small lipomas are noted along the tentorium. Vascular: No hyperdense vessel or unexpected calcification. Skull: Normal. Negative for fracture or focal lesion. Sinuses/Orbits: No acute finding. Other: None. IMPRESSION: No acute intracranial abnormality noted. Electronically Signed   By: Inez Catalina M.D.   On: 04/01/2021 09:23   US RENAL  Result Date: 04/01/2021 CLINICAL DATA:  Acute kidney injury EXAM: RENAL / URINARY TRACT ULTRASOUND COMPLETE COMPARISON:  10/28/2018 CT without contrast FINDINGS: Right Kidney: Renal measurements: 11.6 x 5.6 x 4.0 cm = volume: 135 mL. Increased cortical echogenicity. Prominent medullary pyramids. No hydronephrosis. No focal abnormality. Left Kidney: Renal measurements: 12.8 x 5.8 x 5.7 cm = volume: 222 mL. Slight increased echogenicity. No focal abnormality, acute finding or hydronephrosis. Bladder: Appears normal for degree of bladder distention. Other: No free fluid or ascites IMPRESSION: Slight increased renal echogenicity compatible with  medical renal disease. No acute finding or hydronephrosis. Electronically Signed   By: Jerilynn Mages.  Shick M.D.   On: 04/01/2021 12:09     EKG: I have personally reviewed.  Sinus rhythm, QTC 449, bilateral atrial enlargement, LVH, nonspecific T wave change  Assessment/Plan Principal Problem:   Hyponatremia Active Problems:   Tobacco use disorder   Alcohol use   Cirrhosis of liver ([place])   Fall at home, initial encounter   Diarrhea       UTI (urinary tract infection)   Acute renal injury ([place])   Hyponatremia: Sodium 123.  Likely multifactorial etiology, including GI loss secondary to nausea, vomiting and diarrhea, poor oral intake, dehydration, potomania due to alcohol abuse  -Placed on MedSurg bed for observation - Will check urine sodium, urine osmolality, serum osmolality. - IVF: 1L NS in ED, will continue with IV normal saline at 75 mL/h of D5-normal saline -  f/u by BMP q8h - avoid over correction too fast due to risk of central pontine myelinolysis  Tobacco abuse and Alcohol abuse: -Did counseling about importance of quitting smoking -Nicotine patch -Did counseling about the importance of quitting drinking -CIWA protocol  Cirrhosis of liver ([place]): Liver function okay.  Mental status normal -check ammonia level -Check INR and PTT  Fall at home, initial encounter: CT head negative. -PT/OT  AKI: Likely due to prerenal and dehydration.  Renal ultrasound is negative for obstruction or stone. -IV fluid as above -Avoid using renal toxic medications -Follow-up with BMP  Diarrhea -Check C. difficileand GI pathogen panel -IV fluid as above  UTI (urinary tract infection): Patient does not have symptoms of UTI, but has suprapubic discomfort.  Urinalysis positive for UTI. -IV Rocephin -Follow-up urine culture     DVT ppx:  SQ Lovenox Code Status: Full code Family Communication:   Yes, patient's daughter   at bed side Disposition Plan:  Anticipate discharge back to previous environment Consults called: None Admission status and Level of care: Med-Surg:   for obs    Status is: Observation  The patient remains OBS appropriate and will d/c before 2 midnights.  Dispo: The patient is from: Home              Anticipated d/c is to: Home              Patient currently is not medically stable to d/c.   Difficult to place patient No          Date of Service 04/01/2021    Ivor Costa [facility]   If 7PM-7AM, please contact night-coverage [email or web address] 04/01/2021, 4:55 PM

## 2021-04-01 NOTE — ED Notes (Signed)
Pt provided with a urinal at this time

## 2021-04-01 NOTE — ED Provider Notes (Signed)
[facility] Emergency Department Provider Note  ____________________________________________  Time seen: Approximately 9:58 AM  I have reviewed the triage vital signs and the nursing notes.   HISTORY  Chief Complaint Weakness    HPI Mark Carr is a 65 y.o. male with a past history of liver cirrhosis and daily alcohol abuse who comes ED complaining of generalized weakness and diarrhea for the past few days.  Diarrhea is watery, not black or bloody.  Gets dizzy with standing and had a fall yesterday.  Feels better with sitting down and lying down.  No vomiting.  Denies passing out, denies hitting his head.  Denies any pain, no headache or neck pain.  No vision changes. Denies any recent hospitalization or antibiotic use.        Past Medical History:  Diagnosis Date  . Cirrhosis of liver ([place])   . Wrist fracture      Patient Active Problem List   Diagnosis Date Noted  . Uninsured 11/29/2019  . Unintentional weight loss 11/29/2019  . Tobacco use disorder 05/18/2019  . Macrocytosis 05/18/2019  . Elevated LFTs 05/18/2019  . Steatosis of liver 05/18/2019  . Chronic fatigue 05/18/2019  . Colon cancer screening 05/18/2019  . Alcohol use 05/18/2019     Past Surgical History:  Procedure Laterality Date  . WRIST FRACTURE SURGERY Right 2016     Prior to Admission medications   Medication Sig Start Date End Date Taking? Authorizing Provider  naltrexone (DEPADE) 50 MG tablet Take 1 tablet (50 mg total) by mouth daily. 11/29/19   Virginia Crews, MD     Allergies Patient has no known allergies.   Family History  Problem Relation Age of Onset  . Hypertension Mother   . Deep vein thrombosis Mother   . Cancer Sister        unknown type  . Heart disease Brother   . Cancer Maternal Grandmother     Social History Social History   Tobacco Use  . Smoking status: Current Every Day Smoker    Packs/day: 0.50    Years: 40.00    Pack  years: 20.00    Types: Cigarettes  . Smokeless tobacco: Never Used  Vaping Use  . Vaping Use: Never used  Substance Use Topics  . Alcohol use: Yes    Alcohol/week: 4.0 standard drinks    Types: 4 Cans of beer per week    Comment: 4 beers a day  . Drug use: Not Currently    Review of Systems  Constitutional:   No fever or chills.  ENT:   No sore throat. No rhinorrhea. Cardiovascular:   No chest pain or syncope. Respiratory:   No dyspnea or cough. Gastrointestinal:   Negative for abdominal pain, vomiting positive diarrhea.  Musculoskeletal:   Negative for focal pain or swelling All other systems reviewed and are negative except as documented above in ROS and HPI.  ____________________________________________   PHYSICAL EXAM:  VITAL SIGNS: ED Triage Vitals [04/01/21 0823]  Enc Vitals Group     BP 117/76     Pulse Rate 86     Resp 18     Temp 97.7 F (36.5 C)     Temp src      SpO2 99 %     Weight 125 lb (56.7 kg)     Height 5\' 8"  (1.727 m)     Head Circumference      Peak Flow      Pain Score 6  Pain Loc      Pain Edu?      Excl. in [place]?     Vital signs reviewed, nursing assessments reviewed.   Constitutional:   Alert and oriented. Non-toxic appearance. Eyes:   Conjunctivae are normal. EOMI. PERRL. ENT      Head:   Normocephalic and atraumatic.      Nose:   Normal      Mouth/Throat: Dry mucous membranes .      Neck:   No meningismus. Full ROM. Hematological/Lymphatic/Immunilogical:   No cervical lymphadenopathy. Cardiovascular:   RRR. Symmetric bilateral radial and DP pulses.  No murmurs. Cap refill less than 2 seconds. Respiratory:   Normal respiratory effort without tachypnea/retractions. Breath sounds are clear and equal bilaterally. No wheezes/rales/rhonchi. Gastrointestinal:   Soft and nontender. Non distended. There is no CVA tenderness.  No rebound, rigidity, or guarding. Genitourinary:   deferred Musculoskeletal:   Normal range of motion in all  extremities. No joint effusions.  No lower extremity tenderness.  No edema. Neurologic:   Normal speech and language.  Motor grossly intact. No acute focal neurologic deficits are appreciated.  Skin:    Skin is warm, dry and intact. No rash noted.  No petechiae, purpura, or bullae.  ____________________________________________    LABS (pertinent positives/negatives) (all labs ordered are listed, but only abnormal results are displayed) Labs Reviewed  BASIC METABOLIC PANEL - Abnormal; Notable for the following components:      Result Value   Sodium 123 (*)    Chloride 80 (*)    CO2 19 (*)    Glucose, Bld 150 (*)    BUN 76 (*)    Creatinine, Ser 4.73 (*)    Calcium 8.8 (*)    GFR, Estimated 13 (*)    Anion gap 24 (*)    All other components within normal limits  CBC - Abnormal; Notable for the following components:   Hemoglobin 17.1 (*)    MCHC 37.3 (*)    Platelets 149 (*)    All other components within normal limits  RESP PANEL BY RT-PCR (FLU A&B, COVID) ARPGX2  URINALYSIS, COMPLETE (UACMP) WITH MICROSCOPIC  CBG MONITORING, ED   ____________________________________________   EKG  Interpreted by me Sinus rhythm rate of 85, normal axis and intervals.  Poor R wave progression.  LVH.  Normal ST segments and T waves.  No ischemic changes.  ____________________________________________    RADIOLOGY  CT Head Wo Contrast  Result Date: 04/01/2021 CLINICAL DATA:  Recent fall with altered mental status, initial encounter EXAM: CT HEAD WITHOUT CONTRAST TECHNIQUE: Contiguous axial images were obtained from the base of the skull through the vertex without intravenous contrast. COMPARISON:  08/31/2007 FINDINGS: Brain: No evidence of acute infarction, hemorrhage, hydrocephalus, extra-axial collection or mass lesion/mass effect. Small lipomas are noted along the tentorium. Vascular: No hyperdense vessel or unexpected calcification. Skull: Normal. Negative for fracture or focal lesion.  Sinuses/Orbits: No acute finding. Other: None. IMPRESSION: No acute intracranial abnormality noted. Electronically Signed   By: Inez Catalina M.D.   On: 04/01/2021 09:23    ____________________________________________   PROCEDURES Procedures  ____________________________________________  DIFFERENTIAL DIAGNOSIS   Dehydration, electrolyte abnormality, intracranial hemorrhage, ketoacidosis  CLINICAL IMPRESSION / ASSESSMENT AND PLAN / ED COURSE  Medications ordered in the ED: Medications  thiamine (B-1) injection 100 mg (has no administration in time range)  folic acid injection 1 mg (has no administration in time range)  dextrose 5 %-0.45 % sodium chloride infusion (has no administration in time  range)  sodium chloride 0.9 % bolus 1,000 mL (0 mLs Intravenous Stopped 04/01/21 0959)    Pertinent labs & imaging results that were available during my care of the patient were reviewed by me and considered in my medical decision making (see chart for details).  Mark Carr was evaluated in Emergency Department on 04/01/2021 for the symptoms described in the history of present illness. He was evaluated in the context of the global COVID-19 pandemic, which necessitated consideration that the patient might be at risk for infection with the SARS-CoV-2 virus that causes COVID-19. Institutional protocols and algorithms that pertain to the evaluation of patients at risk for COVID-19 are in a state of rapid change based on information released by regulatory bodies including the CDC and federal and state organizations. These policies and algorithms were followed during the patient's care in the ED.     Clinical Course as of 04/01/21 1001  Tue Apr 01, 2021  0920 Patient presents with generalized weakness and diarrhea in the setting of alcohol abuse.  Patient is clearly malnourished.  Chemistry panel shows hyponatremia, AKI, metabolic acidosis.  Will give thiamine folate and IV fluids and plan for  admission. [PS]  5427 CT head unremarkable [PS]    Clinical Course User Index [PS] Carrie Mew, MD     ____________________________________________   FINAL CLINICAL IMPRESSION(S) / ED DIAGNOSES    Final diagnoses:  Generalized weakness  AKI (acute kidney injury) ([place])  Hyponatremia     ED Discharge Orders    None      Portions of this note were generated with dragon dictation software. Dictation errors may occur despite best attempts at proofreading.   Carrie Mew, MD 04/01/21 1001

## 2021-04-01 NOTE — ED Notes (Signed)
Pt given meal tray and gingerale at this time. Family at bedside.

## 2021-04-01 NOTE — ED Triage Notes (Addendum)
Pt comes with c/o weakness and diarrhea. Pt states pain in belly. EMS reports fall yesterday and denies any LOC or hitting head. Pt states he doesn't remember if he hit his head or not. Pt states the fall hurt.  Pt is A&O X3. VSS, 18g LAC  EMS reports per family pt has abuse hx of alcohol and drugs. Pt states alcohol use and denies any drug use.

## 2021-04-01 NOTE — Progress Notes (Signed)
PHARMACIST - PHYSICIAN COMMUNICATION  CONCERNING:  Enoxaparin (Lovenox) for DVT Prophylaxis   DESCRIPTION: Patient was prescribed enoxaprin 40mg  q24 hours for VTE prophylaxis.   Filed Weights   04/01/21 0823  Weight: 56.7 kg (125 lb)    Body mass index is 19.01 kg/m.  Estimated Creatinine Clearance: 12.7 mL/min (A) (by C-G formula based on SCr of 4.73 mg/dL (H)).   Patient is candidate for enoxaparin 30mg  every 24 hours based on CrCl <66ml/min or Weight <45kg  RECOMMENDATION: Pharmacy has adjusted enoxaparin dose per [facility] policy.  Patient is now receiving enoxaparin 30 mg every 24 hours    Darnelle Bos, PharmD Clinical Pharmacist  04/01/2021 4:56 PM

## 2021-04-02 DIAGNOSIS — E871 Hypo-osmolality and hyponatremia: Secondary | ICD-10-CM

## 2021-04-02 DIAGNOSIS — E43 Unspecified severe protein-calorie malnutrition: Secondary | ICD-10-CM | POA: Insufficient documentation

## 2021-04-02 LAB — COMPREHENSIVE METABOLIC PANEL
ALT: 21 U/L (ref 0–44)
AST: 43 U/L — ABNORMAL HIGH (ref 15–41)
Albumin: 3.3 g/dL — ABNORMAL LOW (ref 3.5–5.0)
Alkaline Phosphatase: 53 U/L (ref 38–126)
Anion gap: 11 (ref 5–15)
BUN: 50 mg/dL — ABNORMAL HIGH (ref 8–23)
CO2: 21 mmol/L — ABNORMAL LOW (ref 22–32)
Calcium: 8.1 mg/dL — ABNORMAL LOW (ref 8.9–10.3)
Chloride: 96 mmol/L — ABNORMAL LOW (ref 98–111)
Creatinine, Ser: 1.66 mg/dL — ABNORMAL HIGH (ref 0.61–1.24)
GFR, Estimated: 46 mL/min — ABNORMAL LOW (ref >60)
Glucose, Bld: 97 mg/dL (ref 70–99)
Potassium: 2.7 mmol/L — CL (ref 3.5–5.1)
Sodium: 128 mmol/L — ABNORMAL LOW (ref 135–145)
Total Bilirubin: 0.7 mg/dL (ref 0.3–1.2)
Total Protein: 7 g/dL (ref 6.5–8.1)

## 2021-04-02 LAB — GASTROINTESTINAL PANEL BY PCR, STOOL (REPLACES STOOL CULTURE)

## 2021-04-02 LAB — C DIFFICILE QUICK SCREEN W PCR REFLEX
C Diff antigen: NEGATIVE
C Diff interpretation: NOT DETECTED
C Diff toxin: NEGATIVE

## 2021-04-02 MED ORDER — NICOTINE POLACRILEX 2 MG MT GUM
2.0000 mg | CHEWING_GUM | OROMUCOSAL | Status: DC | PRN
  Administered 2021-04-02: 2 mg via ORAL
  Filled 2021-04-02 (×3): qty 1

## 2021-04-02 MED ORDER — ENSURE ENLIVE PO LIQD
237.0000 mL | Freq: Three times a day (TID) | ORAL | Status: DC
  Administered 2021-04-02 – 2021-04-03 (×4): 237 mL via ORAL

## 2021-04-02 MED ORDER — RISAQUAD PO CAPS
2.0000 | ORAL_CAPSULE | Freq: Three times a day (TID) | ORAL | Status: DC
  Administered 2021-04-02 – 2021-04-03 (×4): 2 via ORAL
  Filled 2021-04-02 (×4): qty 2

## 2021-04-02 MED ORDER — BACID PO TABS
2.0000 | ORAL_TABLET | Freq: Three times a day (TID) | ORAL | Status: DC
  Filled 2021-04-02 (×5): qty 2

## 2021-04-02 MED ORDER — POTASSIUM CHLORIDE 10 MEQ/100ML IV SOLN
10.0000 meq | INTRAVENOUS | Status: AC
  Administered 2021-04-02 (×4): 10 meq via INTRAVENOUS
  Filled 2021-04-02 (×4): qty 100

## 2021-04-02 MED ORDER — POTASSIUM CHLORIDE CRYS ER 20 MEQ PO TBCR
40.0000 meq | EXTENDED_RELEASE_TABLET | Freq: Two times a day (BID) | ORAL | Status: DC
  Administered 2021-04-02 (×2): 40 meq via ORAL
  Filled 2021-04-02 (×2): qty 2

## 2021-04-02 MED ORDER — SODIUM CHLORIDE 0.9 % IV BOLUS
500.0000 mL | Freq: Once | INTRAVENOUS | Status: AC
  Administered 2021-04-02: 10:00:00 500 mL via INTRAVENOUS

## 2021-04-02 NOTE — Evaluation (Signed)
Physical Therapy Evaluation Patient Details Name: Mark Carr MRN: [id number] DOB: 03/19/1956 Today's Date: 04/02/2021   History of Present Illness  65 y.o. male with medical history significant of liver cirrhosis, alcohol abuse, tobacco abuse, who presents with generalized weakness, fall, diarrhea.  Clinical Impression  Pt did well with PT exam, displayed some minimal confusion but was able to perform mobility and >168ft of in room ambulation w/o AD safely and with good confidence.  Pt's HR remained stable with the effort and though he reports being a little stiff and quicker to fatigue from being in bed he endorses feeling close to his baseline.  Pt with no balance or overt safety issues.  He reports family lives close and multiple members check on him regularly.  Pt safe to return home, does not require further PT intervention.      Follow Up Recommendations No PT follow up    Equipment Recommendations  None recommended by PT    Recommendations for Other Services       Precautions / Restrictions Precautions Precautions: None Restrictions Weight Bearing Restrictions: No      Mobility  Bed Mobility Overal bed mobility: Independent             General bed mobility comments: from flat bed    Transfers Overall transfer level: Modified independent Equipment used: None             General transfer comment: no physical assist needed  Ambulation/Gait Ambulation/Gait assistance: Modified independent (Device/Increase time) Gait Distance (Feet): 120 Feet Assistive device: None       General Gait Details: multiple loops in the room with no stagger stepping or overt safety issues.  Minimal fatigue and though he reports feeling a little weaker than baseline he reports "just from being in bed, I feel pretty good"  Stairs            Wheelchair Mobility    Modified Rankin (Stroke Patients Only)       Balance Overall balance assessment: Modified  Independent                                           Pertinent Vitals/Pain Pain Assessment: No/denies pain    Home Living Family/patient expects to be discharged to:: Private residence Living Arrangements: Alone Available Help at Discharge: Available PRN/intermittently;Family Type of Home: House Home Access: Stairs to enter Entrance Stairs-Rails: Right;Left;Can reach both Technical brewer of Steps: 2 Home Layout: One level Home Equipment: None      Prior Function Level of Independence: Independent         Comments: Pt reports living at home alone and being independent in all aspects of care and mobility without use of AD, including still doing masonry/brick work. He does not drive. Family lives next door and multiple family support to assist with driving/appts/groceries etc. Pt does endorse drinking several beers daily. He does not endorse falls at home.     Hand Dominance   Dominant Hand: Right    Extremity/Trunk Assessment   Upper Extremity Assessment Upper Extremity Assessment: Overall WFL for tasks assessed    Lower Extremity Assessment Lower Extremity Assessment: Overall WFL for tasks assessed       Communication   Communication: No difficulties  Cognition Arousal/Alertness: Awake/alert Behavior During Therapy: WFL for tasks assessed/performed Overall Cognitive Status: No family/caregiver present to determine baseline cognitive functioning  General Comments: Pt able to answer most questions and follow basic commands seemingly well but showed general lack of situational awareness      General Comments      Exercises     Assessment/Plan    PT Assessment Patent does not need any further PT services  PT Problem List         PT Treatment Interventions      PT Goals (Current goals can be found in the Care Plan section)  Acute Rehab PT Goals Patient Stated Goal: to go home PT  Goal Formulation: All assessment and education complete, DC therapy    Frequency     Barriers to discharge        Co-evaluation               AM-PAC PT "6 Clicks" Mobility  Outcome Measure Help needed turning from your back to your side while in a flat bed without using bedrails?: None Help needed moving from lying on your back to sitting on the side of a flat bed without using bedrails?: None Help needed moving to and from a bed to a chair (including a wheelchair)?: None Help needed standing up from a chair using your arms (e.g., wheelchair or bedside chair)?: None Help needed to walk in hospital room?: None Help needed climbing 3-5 steps with a railing? : A Little 6 Click Score: 23    End of Session   Activity Tolerance: Patient tolerated treatment well Patient left: with bed alarm set;with call bell/phone within reach Nurse Communication: Mobility status PT Visit Diagnosis: Muscle weakness (generalized) (M62.81);Unsteadiness on feet (R26.81)    Time: 8546-2703 PT Time Calculation (min) (ACUTE ONLY): 20 min   Charges:   PT Evaluation $PT Eval Low Complexity: 1 Low PT Treatments $Gait Training: 8-22 mins        Kreg Shropshire, DPT 04/02/2021, 11:12 AM

## 2021-04-02 NOTE — Evaluation (Signed)
Occupational Therapy Evaluation Patient Details Name: FRENCH KENDRA MRN: [id number] DOB: 02-Apr-1956 Today's Date: 04/02/2021    History of Present Illness 65 y.o. male with medical history significant of liver cirrhosis, alcohol abuse, tobacco abuse, who presents with generalized weakness, fall, diarrhea.   Clinical Impression   Upon entering the room, pt supine in bed with no c/o pain this session. Pt was pleasant and cooperative throughout session. He reports living at home alone in a single store home with 2 STE. He endorses being independent in all aspects of care and functional mobility. Pt reports family living next door and multiple family members check in on him daily. He does not drive but family assists him with getting to appointments and getting him groceries. Pt demonstrates self care tasks, standing tasks, and functional mobility/transfers independently without assist or LOB this session. Pt does appear to not be oriented to time this session and is slow to process. This may be baseline but unsure as no family present. Pt with no acute OT needs at this time. OT to SIGN OFF. Thank you for this referral .     Follow Up Recommendations  No OT follow up    Equipment Recommendations  None recommended by OT       Precautions / Restrictions Precautions Precautions: None      Mobility Bed Mobility Overal bed mobility: Independent             General bed mobility comments: from flat bed    Transfers Overall transfer level: Modified independent Equipment used: None             General transfer comment: no physical assist needed    Balance Overall balance assessment: Modified Independent                                         ADL either performed or assessed with clinical judgement   ADL Overall ADL's : Modified independent                                       General ADL Comments: without use of AD. Pt does need  increased time for sequencing and processing.     Vision Patient Visual Report: No change from baseline              Pertinent Vitals/Pain Pain Assessment: No/denies pain     Hand Dominance Right   Extremity/Trunk Assessment Upper Extremity Assessment Upper Extremity Assessment: Overall WFL for tasks assessed   Lower Extremity Assessment Lower Extremity Assessment: Overall WFL for tasks assessed       Communication Communication Communication: No difficulties   Cognition Arousal/Alertness: Awake/alert Behavior During Therapy: WFL for tasks assessed/performed Overall Cognitive Status: No family/caregiver present to determine baseline cognitive functioning                                 General Comments: Cognition may be baseline but unsure. Pt has phone and socks sitting inside of BSC when entering the room. When asked why he placed in commode he reports, " I just needed a place to put them". Pt also reports date as "June 5th" and seemed surprised when therapist oriented him correctly.  Home Living Family/patient expects to be discharged to:: Private residence Living Arrangements: Alone Available Help at Discharge: Available PRN/intermittently;Family Type of Home: House Home Access: Stairs to enter Technical brewer of Steps: 2 Entrance Stairs-Rails: Right;Left;Can reach both Home Layout: One level     Bathroom Shower/Tub: Tub/shower unit         Home Equipment: None          Prior Functioning/Environment Level of Independence: Independent        Comments: Pt reports living at home alone and being independent in all aspects of care and mobility without use of AD. He does not drive. Family lives next door and multiple family support to assist with driving/appts/groceries etc. Pt does endorse drinking several beers daily. He does not endorse falls at home.                 OT Goals(Current goals can be found in the  care plan section) Acute Rehab OT Goals Patient Stated Goal: to go home OT Goal Formulation: With patient Time For Goal Achievement: 04/16/21 Potential to Achieve Goals: Good  OT Frequency:      AM-PAC OT "6 Clicks" Daily Activity     Outcome Measure Help from another person eating meals?: None Help from another person taking care of personal grooming?: None Help from another person toileting, which includes using toliet, bedpan, or urinal?: None Help from another person bathing (including washing, rinsing, drying)?: None Help from another person to put on and taking off regular upper body clothing?: None Help from another person to put on and taking off regular lower body clothing?: None 6 Click Score: 24   End of Session Nurse Communication: Mobility status  Activity Tolerance: Patient tolerated treatment well Patient left: in bed;with call bell/phone within reach;with bed alarm set;with nursing/sitter in room                   Time: 3500-9381 OT Time Calculation (min): 20 min Charges:  OT General Charges $OT Visit: 1 Visit OT Evaluation $OT Eval Low Complexity: 1 Low OT Treatments $Self Care/Home Management : 8-22 mins  Darleen Crocker, MS, OTR/L , CBIS ascom [phone]  04/02/21, 10:56 AM

## 2021-04-02 NOTE — Progress Notes (Addendum)
Initial Nutrition Assessment  DOCUMENTATION CODES:  Severe malnutrition in context of chronic illness  INTERVENTION:   Continue current diet as ordered, encourage PO intake  Ensure Enlive po TID, each supplement provides 350 kcal and 20 grams of protein  Magic cup TID with meals, each supplement provides 290 kcal and 9 grams of protein  MVI with minerals, thiamine, folic acid daily  NUTRITION DIAGNOSIS:  Severe Malnutrition related to social / environmental circumstances (EtOH abuse) as evidenced by severe fat depletion,severe muscle depletion.  GOAL:  Patient will meet greater than or equal to 90% of their needs  MONITOR:  PO intake,Supplement acceptance,Labs,Weight trends  REASON FOR ASSESSMENT:  Malnutrition Screening Tool    ASSESSMENT:  Pt presented to  ED with weakness and watery diarrhea for the past few days. States he gets dizzy with standing and had a fall yesterday. PMH relevant for liver cirrhosis and daily alcohol abuse  Pt resting in bed at the time of visit. Breakfast tray at bedside, poorly consumed. Pt reports he is just not feeling like eating today. Reports a good appetite at home, but also states he doesn't eat very much. States that it is just him and his dog at home, so he doesn't like to make too much food. Pt states he has noticed his clothes fitting looser. Usual weight is reported at ~130 lbs. Obtained bed weight which shows significant loss and is more consistent with visual inspection. Pt agreeable to nutrition supplements this admission. Several lab abnormalities noted, likely related to AKI due to dehydration. BMP for today pending.   Nutritionally Relevant Medications: Scheduled Meds: . folic acid  1 mg Oral Daily  . lactobacillus acidophilus  2 tablet Oral TID  . multivitamin with minerals  1 tablet Oral Daily  . thiamine  100 mg Oral Daily   Continuous Infusions: . dextrose 5 % and 0.9% NaCl 75 mL/hr at 04/01/21 2158   PRN Meds: ondansetron    Nutritionally Relevant Labs 5/24:  Na 123 (L), Chloride 80 (L)  BUN 76 (H), Creatinine 4.73 (H)  Phosphorus 11.1 (H)  Mg 2.9 (H)  NUTRITION - FOCUSED PHYSICAL EXAM: Flowsheet Row Most Recent Value  Orbital Region Severe depletion  Upper Arm Region Severe depletion  Thoracic and Lumbar Region Severe depletion  Buccal Region Severe depletion  Temple Region Severe depletion  Clavicle Bone Region Moderate depletion  Clavicle and Acromion Bone Region Moderate depletion  Scapular Bone Region Moderate depletion  Dorsal Hand Severe depletion  Patellar Region Severe depletion  Anterior Thigh Region Severe depletion  Posterior Calf Region Severe depletion  Edema (RD Assessment) None  Hair Reviewed  Eyes Reviewed  Mouth Reviewed  Skin Reviewed  Nails Reviewed     Diet Order:   Diet Order            Diet regular Room service appropriate? Yes; Fluid consistency: Thin  Diet effective now                EDUCATION NEEDS:  No education needs have been identified at this time  Skin:  Skin Assessment: Reviewed RN Assessment  Last BM:  5/25 - type 7, per RN documentation  Height:  Ht Readings from Last 1 Encounters:  04/01/21 5\' 8"  (1.727 m)   Weight:  Wt Readings from Last 1 Encounters:  04/02/21 46.4 kg    Ideal Body Weight:  70 kg  BMI:  Body mass index is 15.55 kg/m.  Estimated Nutritional Needs:   Kcal:  1600-1800 kcal/d  Protein:  80-100 g/d  Fluid:  >1600 mL/d  Ranell Patrick, RD, LDN Clinical Dietitian Pager on [place]

## 2021-04-02 NOTE — TOC Initial Note (Signed)
Transition of Care [facility]) - Initial/Assessment Note    Patient Details  Name: Mark Carr MRN: [id number] Date of Birth: 01/05/1956  [facility] CM/SW Contact:    Shelbie Hutching, RN Phone Number: 04/02/2021, 2:54 PM  Clinical Narrative:                 Patient admitted to the hospital with hyponatremia, history of alcohol abuse and positive for cocaine on admission.  RNCM met with patient at the bedside.  Daughter, Geraldo Pitter, at the bedside.  Patient has 2 daughters both very involved in his care.  Patient has not needs or questions when [facility] met with patient at the bedside.  He is current with PCP at [facility] and he does not take any medications at home.  Patient qualifies in August for Medicare and family is going to work on applying for [facility] for patient as well.     Expected Discharge Plan: Home/Self Care Barriers to Discharge: Continued Medical Work up   Patient Goals and CMS Choice Patient states their goals for this hospitalization and ongoing recovery are:: Patient wants to get back home when ready      Expected Discharge Plan and Services Expected Discharge Plan: Home/Self Care   Discharge Planning Services: CM Consult   Living arrangements for the past 2 months: Single Family Home                 DME Arranged: N/A DME Agency: NA       HH Arranged: NA          Prior Living Arrangements/Services Living arrangements for the past 2 months: Single Family Home Lives with:: Self Patient language and need for interpreter reviewed:: Yes Do you feel safe going back to the place where you live?: Yes      Need for Family Participation in Patient Care: Yes (Comment) (substance abuse) Care giver support system in place?: Yes (comment) (daughters)   Criminal Activity/Legal Involvement Pertinent to Current Situation/Hospitalization: No - Comment as needed  Activities of Daily Living Home Assistive Devices/Equipment: None ADL Screening  (condition at time of admission) Patient's cognitive ability adequate to safely complete daily activities?: Yes Is the patient deaf or have difficulty hearing?: No Does the patient have difficulty seeing, even when wearing glasses/contacts?: No Does the patient have difficulty concentrating, remembering, or making decisions?: No Patient able to express need for assistance with ADLs?: Yes Does the patient have difficulty dressing or bathing?: No Independently performs ADLs?: Yes (appropriate for developmental age) Does the patient have difficulty walking or climbing stairs?: No Weakness of Legs: None Weakness of Arms/Hands: None  Permission Sought/Granted Permission sought to share information with : Case Manager,Family Supports Permission granted to share information with : Yes, Verbal Permission Granted  Share Information with NAME: [name] and [place] granted to share info w Relationship: daughters     Emotional Assessment Appearance:: Appears older than stated age Attitude/Demeanor/Rapport: Engaged Affect (typically observed): Accepting Orientation: : Oriented to Self,Oriented to Place,Oriented to Situation Alcohol / Substance Use: Alcohol Use,Illicit Drugs Psych Involvement: No (comment)  Admission diagnosis:  Hyponatremia [E87.1] Generalized weakness [R53.1] AKI (acute kidney injury) ([place]) [N17.9] Patient Active Problem List   Diagnosis Date Noted  . Hyponatremia 04/01/2021  . UTI (urinary tract infection) 04/01/2021  . Acute renal injury ([place]) 04/01/2021  . Cirrhosis of liver ([place])   . Fall at home, initial encounter   . Diarrhea   .     Marland Kitchen  Generalized weakness   . Uninsured 11/29/2019  . Unintentional weight loss 11/29/2019  . Tobacco use disorder 05/18/2019  . Macrocytosis 05/18/2019  . Elevated LFTs 05/18/2019  . Steatosis of liver 05/18/2019  . Chronic fatigue 05/18/2019  . Colon cancer screening 05/18/2019  . Alcohol use 05/18/2019   PCP:   Virginia Crews, MD Pharmacy:   [facility] [place] [address] Phone: [phone] Fax: [phone]     Social Determinants of Health (SDOH) Interventions    Readmission Risk Interventions No flowsheet data found.

## 2021-04-02 NOTE — Progress Notes (Signed)
PROGRESS NOTE    Mark Carr  FGH:[id number] DOB: [birth year] DOA: 04/01/2021 PCP: Virginia Crews, MD    Brief Narrative:  Mark Carr is a 65 y.o. male with medical history significant of liver cirrhosis, alcohol abuse, tobacco abuse, who presents with generalized weakness, fall, diarrhea.  Patient states that he has been feeling weak in the past several days. He has dizziness and fell yesterday afternoon when he was walking at home.  He denies loss of consciousness.  Denies any injury.  No headache or neck pain.  Patient has mild dry cough, denies chest pain, shortness breath, fever or chills.  Patient states that he has been having nausea, vomiting, diarrhea in the past 4 days.  He has mild lower abdominal pain.  He states that he has had at least 8 times of watery diarrhea since yesterday.  He has had 4 times of nonbilious nonbloody vomiting.  He denies dysuria or burning on urination suprapubic abdominal discomfort.  No unilateral numbness or tingling in extremities.  No facial droop or slurred speech.  Patient states he has intermittent leg cramps.  ED Course: pt was found to have WBC 4.7, positive UDS for cocaine, positive urinalysis (cloudy appearance, moderate amount of leukocyte, many bacteria, WBC 21-50), pending COVID-19 PCR, ammonia level 18, AKI with creatinine 4.73, BUN 76 (baseline creatinine 0.53 on 03/28/2018), sodium 123, temperature normal, blood pressure 133/83, heart rate 86, RR 20, oxygen saturation 99% on room air.  CT head is negative for acute intracranial abnormalities.  Renal ultrasound is negative for stone or obstruction.  Patient is placed on MedSurg bed for observation.  5/25- had diarrhea on the floor per nsg , this am. Pt c/o abd pain going across abd. No n/v.  Consultants:     Procedures:   Antimicrobials:    ceftriaxone   Subjective: As above  Objective: Vitals:   04/01/21 1943 04/02/21 0018 04/02/21 0336 04/02/21 0818  BP: 109/74  102/65 95/71 (!) 84/68  Pulse: 90 84 85 82  Resp: 18 17 18 15   Temp: 97.7 F (36.5 C) 97.8 F (36.6 C) 98.2 F (36.8 C) 98.5 F (36.9 C)  TempSrc:   Oral Oral  SpO2: 99% 99% 100% 100%  Weight:      Height:        Intake/Output Summary (Last 24 hours) at 04/02/2021 0840 Last data filed at 04/01/2021 1746 Gross per 24 hour  Intake 512.16 ml  Output --  Net 512.16 ml   Filed Weights   04/01/21 0823  Weight: 56.7 kg    Examination:  General exam: Appears calm and comfortable  Respiratory system: Clear to auscultation. Respiratory effort normal. Cardiovascular system: S1 & S2 heard, RRR. No JVD, murmurs, rubs, gallops or clicks.  Gastrointestinal system: Abdomen is nondistended, soft and nontender. No organomegaly or masses felt. Normal bowel sounds heard. Central nervous system: Alert and oriented. Grossly intact Extremities: no edema Skin: warm, dry Psychiatry:  Mood & affect appropriate.     Data Reviewed: I have personally reviewed following labs and imaging studies  CBC: Recent Labs  Lab 04/01/21 0818  WBC 4.7  HGB 17.1*  HCT 45.9  MCV 89.8  PLT 678*   Basic Metabolic Panel: Recent Labs  Lab 04/01/21 0818  NA 123*  K 3.5  CL 80*  CO2 19*  GLUCOSE 150*  BUN 76*  CREATININE 4.73*  CALCIUM 8.8*  MG 2.9*  PHOS 11.1*   GFR: Estimated Creatinine Clearance: 12.7 mL/min (A) (by C-G  formula based on SCr of 4.73 mg/dL (H)). Liver Function Tests: Recent Labs  Lab 04/01/21 0818  AST 52*  ALT 25  ALKPHOS 70  BILITOT 0.9  PROT 9.8*  ALBUMIN 4.7   No results for input(s): LIPASE, AMYLASE in the last 168 hours. Recent Labs  Lab 04/01/21 1102  AMMONIA 18   Coagulation Profile: Recent Labs  Lab 04/01/21 1834  INR 1.2   Cardiac Enzymes: No results for input(s): CKTOTAL, CKMB, CKMBINDEX, TROPONINI in the last 168 hours. BNP (last 3 results) No results for input(s): PROBNP in the last 8760 hours. HbA1C: No results for input(s): HGBA1C in the  last 72 hours. CBG: No results for input(s): GLUCAP in the last 168 hours. Lipid Profile: No results for input(s): CHOL, HDL, LDLCALC, TRIG, CHOLHDL, LDLDIRECT in the last 72 hours. Thyroid Function Tests: No results for input(s): TSH, T4TOTAL, FREET4, T3FREE, THYROIDAB in the last 72 hours. Anemia Panel: No results for input(s): VITAMINB12, FOLATE, FERRITIN, TIBC, IRON, RETICCTPCT in the last 72 hours. Sepsis Labs: No results for input(s): PROCALCITON, LATICACIDVEN in the last 168 hours.  Recent Results (from the past 240 hour(s))  Resp Panel by RT-PCR (Flu A&B, Covid) Nasopharyngeal Swab     Status: None   Collection Time: 04/01/21  8:35 AM   Specimen: Nasopharyngeal Swab; Nasopharyngeal(NP) swabs in vial transport medium  Result Value Ref Range Status   SARS Coronavirus 2 by RT PCR NEGATIVE NEGATIVE Final    Comment: (NOTE) SARS-CoV-2 target nucleic acids are NOT DETECTED.  The SARS-CoV-2 RNA is generally detectable in upper respiratory specimens during the acute phase of infection. The lowest concentration of SARS-CoV-2 viral copies this assay can detect is 138 copies/mL. A negative result does not preclude SARS-Cov-2 infection and should not be used as the sole basis for treatment or other patient management decisions. A negative result may occur with  improper specimen collection/handling, submission of specimen other than nasopharyngeal swab, presence of viral mutation(s) within the areas targeted by this assay, and inadequate number of viral copies(<138 copies/mL). A negative result must be combined with clinical observations, patient history, and epidemiological information. The expected result is Negative.  Fact Sheet for Patients:  EntrepreneurPulse.com.au  Fact Sheet for Healthcare Providers:  IncredibleEmployment.be  This test is no t yet approved or cleared by the [place] FDA and  has been authorized for detection and/or  diagnosis of SARS-CoV-2 by FDA under an Emergency Use Authorization (EUA). This EUA will remain  in effect (meaning this test can be used) for the duration of the COVID-19 declaration under Section 564(b)(1) of the Act, 21 U.S.C.section 360bbb-3(b)(1), unless the authorization is terminated  or revoked sooner.       Influenza A by PCR NEGATIVE NEGATIVE Final   Influenza B by PCR NEGATIVE NEGATIVE Final    Comment: (NOTE) The Xpert Xpress SARS-CoV-2/FLU/RSV plus assay is intended as an aid in the diagnosis of influenza from Nasopharyngeal swab specimens and should not be used as a sole basis for treatment. Nasal washings and aspirates are unacceptable for Xpert Xpress SARS-CoV-2/FLU/RSV testing.  Fact Sheet for Patients: EntrepreneurPulse.com.au  Fact Sheet for Healthcare Providers: IncredibleEmployment.be  This test is not yet approved or cleared by the [place] FDA and has been authorized for detection and/or diagnosis of SARS-CoV-2 by FDA under an Emergency Use Authorization (EUA). This EUA will remain in effect (meaning this test can be used) for the duration of the COVID-19 declaration under Section 564(b)(1) of the Act, 21 U.S.C. section 360bbb-3(b)(1),  unless the authorization is terminated or revoked.  Performed at [facility], [address], [place], [place] [address]          Radiology Studies: CT Head Wo Contrast  Result Date: 04/01/2021 CLINICAL DATA:  Recent fall with altered mental status, initial encounter EXAM: CT HEAD WITHOUT CONTRAST TECHNIQUE: Contiguous axial images were obtained from the base of the skull through the vertex without intravenous contrast. COMPARISON:  08/31/2007 FINDINGS: Brain: No evidence of acute infarction, hemorrhage, hydrocephalus, extra-axial collection or mass lesion/mass effect. Small lipomas are noted along the tentorium. Vascular: No hyperdense vessel or unexpected  calcification. Skull: Normal. Negative for fracture or focal lesion. Sinuses/Orbits: No acute finding. Other: None. IMPRESSION: No acute intracranial abnormality noted. Electronically Signed   By: Inez Catalina M.D.   On: 04/01/2021 09:23   US RENAL  Result Date: 04/01/2021 CLINICAL DATA:  Acute kidney injury EXAM: RENAL / URINARY TRACT ULTRASOUND COMPLETE COMPARISON:  10/28/2018 CT without contrast FINDINGS: Right Kidney: Renal measurements: 11.6 x 5.6 x 4.0 cm = volume: 135 mL. Increased cortical echogenicity. Prominent medullary pyramids. No hydronephrosis. No focal abnormality. Left Kidney: Renal measurements: 12.8 x 5.8 x 5.7 cm = volume: 222 mL. Slight increased echogenicity. No focal abnormality, acute finding or hydronephrosis. Bladder: Appears normal for degree of bladder distention. Other: No free fluid or ascites IMPRESSION: Slight increased renal echogenicity compatible with medical renal disease. No acute finding or hydronephrosis. Electronically Signed   By: Jerilynn Mages.  Shick M.D.   On: 04/01/2021 12:09        Scheduled Meds: . enoxaparin (LOVENOX) injection  30 mg Subcutaneous Q24H  . folic acid  1 mg Oral Daily  . lactobacillus acidophilus  2 tablet Oral TID  . LORazepam  0-4 mg Intravenous Q6H   Followed by  . [START ON 04/03/2021] LORazepam  0-4 mg Intravenous Q12H  . multivitamin with minerals  1 tablet Oral Daily  . nicotine  21 mg Transdermal Daily  . pneumococcal 23 valent vaccine  0.5 mL Intramuscular Tomorrow-1000  . thiamine  100 mg Oral Daily   Or  . thiamine  100 mg Intravenous Daily   Continuous Infusions: . cefTRIAXone (ROCEPHIN)  IV Stopped (04/01/21 1543)  . dextrose 5 % and 0.9% NaCl 75 mL/hr at 04/01/21 2158  . sodium chloride      Assessment & Plan:   Principal Problem:   Hyponatremia Active Problems:   Tobacco use disorder   Alcohol use   Cirrhosis of liver ([place])   Fall at home, initial encounter   Diarrhea       UTI (urinary tract infection)    Acute renal injury ([place])   Hyponatremia:  Present on admission  Na improving. 123>>128. Likely GI loss, dehydration, potomania due to alcohol abuse. Improving with ivf.  Will continue ivf Gave bolus this am as spb in 80's.  Cocaine abuse-discussed with patient about joining a group to stop using cocaine.  Discussed risk and complications of ongoing and encouraged him to quit.   Diarrhea-etiology unclear. Cocaine abuse v.s. infectious enteritis.? C. difficile and GI pathogen panel pending  AKI- prerenal Improving with ivf. Today creatinine 1.66 Will monitor, and continue ivf  Tobacco abuse and Alcohol abuse: Counseled about importance of quitting smoking -Nicotine patch -Did counseling about the importance of quitting drinking -CIWA protocol  Cirrhosis of liver [facility]): Liver function okay.  Mental status normal -check ammonia level -Check INR and PTT  Fall at home  CT head negative. -PT/OT  UTI (urinary tract infection): Patient does not have symptoms of UTI, but has suprapubic discomfort.  Urinalysis positive for UTI. -IV Rocephin ucx pending    DVT prophylaxis: Lovenox Code Status: Full Family Communication: Daughter at bedside.  Patient gave me permission to discuss his medical problems and history with daughter.  Status is: Observation  The patient remains OBS appropriate and will d/c before 2 midnights.  Dispo: The patient is from: Home              Anticipated d/c is to: Home              Patient currently is not medically stable to d/c.   Difficult to place patient No            LOS: 0 days   Time spent: 35 minutes with more than 50% on [place], MD [facility] Pager [phone]  If 7PM-7AM, please contact night-coverage 04/02/2021, 8:40 AM

## 2021-04-03 ENCOUNTER — Telehealth: Payer: Self-pay

## 2021-04-03 ENCOUNTER — Telehealth: Payer: Self-pay | Admitting: Family Medicine

## 2021-04-03 LAB — COMPREHENSIVE METABOLIC PANEL
ALT: 18 U/L (ref 0–44)
AST: 35 U/L (ref 15–41)
Albumin: 3 g/dL — ABNORMAL LOW (ref 3.5–5.0)
Alkaline Phosphatase: 59 U/L (ref 38–126)
Anion gap: 8 (ref 5–15)
BUN: 27 mg/dL — ABNORMAL HIGH (ref 8–23)
CO2: 20 mmol/L — ABNORMAL LOW (ref 22–32)
Calcium: 8.1 mg/dL — ABNORMAL LOW (ref 8.9–10.3)
Chloride: 106 mmol/L (ref 98–111)
Creatinine, Ser: 0.91 mg/dL (ref 0.61–1.24)
GFR, Estimated: >60 mL/min (ref >60)
Glucose, Bld: 110 mg/dL — ABNORMAL HIGH (ref 70–99)
Potassium: 3.2 mmol/L — ABNORMAL LOW (ref 3.5–5.1)
Sodium: 134 mmol/L — ABNORMAL LOW (ref 135–145)
Total Bilirubin: 0.5 mg/dL (ref 0.3–1.2)
Total Protein: 6.3 g/dL — ABNORMAL LOW (ref 6.5–8.1)

## 2021-04-03 LAB — URINE CULTURE

## 2021-04-03 LAB — POTASSIUM: Potassium: 4.4 mmol/L (ref 3.5–5.1)

## 2021-04-03 MED ORDER — POTASSIUM CHLORIDE 10 MEQ/100ML IV SOLN
10.0000 meq | INTRAVENOUS | Status: AC
  Administered 2021-04-03 (×4): 10 meq via INTRAVENOUS
  Filled 2021-04-03 (×4): qty 100

## 2021-04-03 MED ORDER — ENOXAPARIN SODIUM 40 MG/0.4ML IJ SOSY: 40.0000 mg | PREFILLED_SYRINGE | INTRAMUSCULAR | Status: DC

## 2021-04-03 MED ORDER — RISAQUAD PO CAPS: 2.0000 | ORAL_CAPSULE | Freq: Three times a day (TID) | ORAL | 0 refills | Status: AC

## 2021-04-03 MED ORDER — ADULT MULTIVITAMIN W/MINERALS CH: 1.0000 | ORAL_TABLET | Freq: Every day | ORAL | 0 refills | Status: AC

## 2021-04-03 MED ORDER — POTASSIUM CHLORIDE CRYS ER 20 MEQ PO TBCR
40.0000 meq | EXTENDED_RELEASE_TABLET | Freq: Once | ORAL | Status: AC
  Administered 2021-04-03: 09:00:00 40 meq via ORAL
  Filled 2021-04-03: qty 2

## 2021-04-03 MED ORDER — FOLIC ACID 1 MG PO TABS: 1.0000 mg | ORAL_TABLET | Freq: Every day | ORAL | 0 refills | Status: AC

## 2021-04-03 MED ORDER — THIAMINE HCL 100 MG PO TABS: 100.0000 mg | ORAL_TABLET | Freq: Every day | ORAL | 0 refills | Status: AC

## 2021-04-03 MED ORDER — CIPROFLOXACIN HCL 500 MG PO TABS: 500.0000 mg | ORAL_TABLET | Freq: Two times a day (BID) | ORAL | 0 refills | Status: AC

## 2021-04-03 MED ORDER — SODIUM CHLORIDE 0.9 % IV SOLN: INTRAVENOUS | Status: DC | PRN

## 2021-04-03 NOTE — Discharge Summary (Signed)
Mark Carr:[id number] DOB: 01/20/1956 DOA: 04/01/2021  PCP: Virginia Crews, MD  Admit date: 04/01/2021 Discharge date: 04/03/2021  Admitted From: home Disposition:  home  Recommendations for Outpatient Follow-up:  1. Follow up with PCP in 1 week 2. Please obtain BMP/CBC in one week      Discharge Condition:Stable CODE STATUS:full  Diet recommendation: Regular   Brief/Interim Summary: Per SNK:NLZJQ Mark Turneris a 65 y.o.malewith medical history significant ofliver cirrhosis, alcohol abuse, tobacco abuse, who presened with generalized weakness, fall, diarrhea. Patient stated that he had been feeling weak in the past several days. He haddizziness and fellyesterday afternoon when he was walking at home. He denies loss of consciousness.He stated that he has had at least 8 times of watery diarrhea and had 4 times of nonbilious nonbloody vomiting.   Hyponatremia: Present on admission Likely GI loss, dehydration, potomania due to alcohol abuse. Improved with IVF/hydration =  Cocaine abuse-discussed with patient about joining a group to stop using cocaine.  Discussed risk and complications of ongoing and encouraged him to quit.   Diarrhea-positive for Salmonella and negative for C. difficile  Patient was treated with IV Rocephin while in the hospital  Will complete course with ciprofloxacin (discussed with pharmacy )   AKI- prerenal Creatinine was 4.73 on admission and improved to 0.91 with IV  encourage patient to continue hydration at home  Tobacco abuse and Alcohol abuse: Counseled about importance of quitting smoking Also counseled patient about cocaine abuse and alcohol abuse.  Encouraged him to join a group that would help him stop his bad habits including perhaps joining AA meetings  Hypokalemia-3.2 today due to diarrhea yesterday Supplemented with IV and p.o. potassium prior to discharge Follow-up with primary care to have blood work checked  within few days of discharge   Cirrhosis of liver (HCC):Liver function okay. Mental status normal  follow-up with PCP  Fall at home  CT head negative. PT OT-no follow-up needed    UTI (urinary tract infection):Ruled out. Urine culture with multiple organisms   Discharge Diagnoses:  Principal Problem:   Hyponatremia Active Problems:   Tobacco use disorder   Alcohol use   Cirrhosis of liver ([place])   Fall at home, initial encounter   Diarrhea       UTI (urinary tract infection)   Acute renal injury ([place])   Protein-calorie malnutrition, severe    Discharge Instructions  Discharge Instructions    Call MD for:  temperature >100.4   Complete by: As directed    Diet general   Complete by: As directed    Discharge instructions   Complete by: As directed    Stop smoking, stop doing  drug/cocaine, stop alcohol use Cannot have 02 tank near fire or smokers . Cannot smoke near 02 tank! It will explode   Increase activity slowly   Complete by: As directed      Allergies as of 04/03/2021   No Known Allergies     Medication List    TAKE these medications   acidophilus Caps capsule Take 2 capsules by mouth 3 (three) times daily for 5 days.   ciprofloxacin 500 MG tablet Commonly known as: CIPRO Take 1 tablet (500 mg total) by mouth 2 (two) times daily for 5 days.   folic acid 1 MG tablet Commonly known as: FOLVITE Take 1 tablet (1 mg total) by mouth daily. Start taking on: Apr 04, 2021   multivitamin with minerals Tabs tablet Take 1 tablet by mouth daily. Start taking  on: Apr 04, 2021   naltrexone 50 MG tablet Commonly known as: DEPADE Take 1 tablet (50 mg total) by mouth daily.   thiamine 100 MG tablet Take 1 tablet (100 mg total) by mouth daily. Start taking on: Apr 04, 2021       Follow-up Information    Bacigalupo, Dionne Bucy, MD. Schedule an appointment as soon as possible for a visit in 1 week.   Specialty: Family Medicine Why: Dr office will  call patient with follow up appt  Contact information: [address] [place] [address] [phone]              No Known Allergies  Consultations:     Procedures/Studies: CT Head Wo Contrast  Result Date: 04/01/2021 CLINICAL DATA:  Recent fall with altered mental status, initial encounter EXAM: CT HEAD WITHOUT CONTRAST TECHNIQUE: Contiguous axial images were obtained from the base of the skull through the vertex without intravenous contrast. COMPARISON:  08/31/2007 FINDINGS: Brain: No evidence of acute infarction, hemorrhage, hydrocephalus, extra-axial collection or mass lesion/mass effect. Small lipomas are noted along the tentorium. Vascular: No hyperdense vessel or unexpected calcification. Skull: Normal. Negative for fracture or focal lesion. Sinuses/Orbits: No acute finding. Other: None. IMPRESSION: No acute intracranial abnormality noted. Electronically Signed   By: Inez Catalina M.D.   On: 04/01/2021 09:23   US RENAL  Result Date: 04/01/2021 CLINICAL DATA:  Acute kidney injury EXAM: RENAL / URINARY TRACT ULTRASOUND COMPLETE COMPARISON:  10/28/2018 CT without contrast FINDINGS: Right Kidney: Renal measurements: 11.6 x 5.6 x 4.0 cm = volume: 135 mL. Increased cortical echogenicity. Prominent medullary pyramids. No hydronephrosis. No focal abnormality. Left Kidney: Renal measurements: 12.8 x 5.8 x 5.7 cm = volume: 222 mL. Slight increased echogenicity. No focal abnormality, acute finding or hydronephrosis. Bladder: Appears normal for degree of bladder distention. Other: No free fluid or ascites IMPRESSION: Slight increased renal echogenicity compatible with medical renal disease. No acute finding or hydronephrosis. Electronically Signed   By: Jerilynn Mages.  Shick M.D.   On: 04/01/2021 12:09      Subjective: Feels better.  His diarrhea has resolved.  He had 1 bowel movement today that was starting to form  Discharge Exam: Vitals:   04/03/21 0725 04/03/21 1156  BP:  110/66 117/71  Pulse: 76 71  Resp: 18 18  Temp: 98.1 F (36.7 C) 97.8 F (36.6 C)  SpO2:     Vitals:   04/02/21 2332 04/03/21 0556 04/03/21 0725 04/03/21 1156  BP: 101/76 110/78 110/66 117/71  Pulse: 80 73 76 71  Resp: 15 15 18 18   Temp: 98.4 F (36.9 C) (!) 97.3 F (36.3 C) 98.1 F (36.7 C) 97.8 F (36.6 C)  TempSrc: Oral Oral    SpO2: 100% 100%    Weight:      Height:        General: Pt is alert, awake, not in acute distress Cardiovascular: RRR, S1/S2 +, no rubs, no gallops Respiratory: CTA bilaterally, no wheezing, no rhonchi Abdominal: Soft, NT, ND, bowel sounds + Extremities: no edema    The results of significant diagnostics from this hospitalization (including imaging, microbiology, ancillary and laboratory) are listed below for reference.     Microbiology: Recent Results (from the past 240 hour(s))  Resp Panel by RT-PCR (Flu A&B, Covid) Nasopharyngeal Swab     Status: None   Collection Time: 04/01/21  8:35 AM   Specimen: Nasopharyngeal Swab; Nasopharyngeal(NP) swabs in vial transport medium  Result Value Ref Range Status  SARS Coronavirus 2 by RT PCR NEGATIVE NEGATIVE Final    Comment: (NOTE) SARS-CoV-2 target nucleic acids are NOT DETECTED.  The SARS-CoV-2 RNA is generally detectable in upper respiratory specimens during the acute phase of infection. The lowest concentration of SARS-CoV-2 viral copies this assay can detect is 138 copies/mL. A negative result does not preclude SARS-Cov-2 infection and should not be used as the sole basis for treatment or other patient management decisions. A negative result may occur with  improper specimen collection/handling, submission of specimen other than nasopharyngeal swab, presence of viral mutation(s) within the areas targeted by this assay, and inadequate number of viral copies(<138 copies/mL). A negative result must be combined with clinical observations, patient history, and epidemiological information.  The expected result is Negative.  Fact Sheet for Patients:  EntrepreneurPulse.com.au  Fact Sheet for Healthcare Providers:  IncredibleEmployment.be  This test is no t yet approved or cleared by the [place] FDA and  has been authorized for detection and/or diagnosis of SARS-CoV-2 by FDA under an Emergency Use Authorization (EUA). This EUA will remain  in effect (meaning this test can be used) for the duration of the COVID-19 declaration under Section 564(b)(1) of the Act, 21 U.S.C.section 360bbb-3(b)(1), unless the authorization is terminated  or revoked sooner.       Influenza A by PCR NEGATIVE NEGATIVE Final   Influenza B by PCR NEGATIVE NEGATIVE Final    Comment: (NOTE) The Xpert Xpress SARS-CoV-2/FLU/RSV plus assay is intended as an aid in the diagnosis of influenza from Nasopharyngeal swab specimens and should not be used as a sole basis for treatment. Nasal washings and aspirates are unacceptable for Xpert Xpress SARS-CoV-2/FLU/RSV testing.  Fact Sheet for Patients: EntrepreneurPulse.com.au  Fact Sheet for Healthcare Providers: IncredibleEmployment.be  This test is not yet approved or cleared by the [place] FDA and has been authorized for detection and/or diagnosis of SARS-CoV-2 by FDA under an Emergency Use Authorization (EUA). This EUA will remain in effect (meaning this test can be used) for the duration of the COVID-19 declaration under Section 564(b)(1) of the Act, 21 U.S.C. section 360bbb-3(b)(1), unless the authorization is terminated or revoked.  Performed at [facility], [address] [place], [place] [address]   Urine Culture     Status: Abnormal   Collection Time: 04/01/21  1:57 PM   Specimen: Urine, Random  Result Value Ref Range Status   Specimen Description   Final    URINE, RANDOM Performed at [facility], [address] [place], [place]  [address]    Special Requests   Final    NONE Performed at [facility], [address] [place], [place] [address]    Culture MULTIPLE SPECIES PRESENT, SUGGEST RECOLLECTION (A)  Final   Report Status 04/03/2021 FINAL  Final  C Difficile Quick Screen w PCR reflex     Status: None   Collection Time: 04/02/21  1:53 PM   Specimen: STOOL  Result Value Ref Range Status   C Diff antigen NEGATIVE NEGATIVE Final   C Diff toxin NEGATIVE NEGATIVE Final   C Diff interpretation No C. difficile detected.  Final    Comment: Performed at [facility], [address] [place], [place] [address]  Gastrointestinal Panel by PCR , Stool     Status: Abnormal   Collection Time: 04/02/21  1:53 PM   Specimen: STOOL  Result Value Ref Range Status   Campylobacter species NOT DETECTED NOT DETECTED Final   Plesimonas shigelloides NOT DETECTED  NOT DETECTED Final   Salmonella species DETECTED (A) NOT DETECTED Final    Comment: RESULT CALLED TO, READ BACK BY AND VERIFIED WITH: CHRIS VANNICK 04/02/21 1544 KLW    Yersinia enterocolitica NOT DETECTED NOT DETECTED Final   Vibrio species NOT DETECTED NOT DETECTED Final   Vibrio cholerae NOT DETECTED NOT DETECTED Final   Enteroaggregative Mark coli (EAEC) NOT DETECTED NOT DETECTED Final   Enteropathogenic Mark coli (EPEC) NOT DETECTED NOT DETECTED Final   Enterotoxigenic Mark coli (ETEC) NOT DETECTED NOT DETECTED Final   Shiga like toxin producing Mark coli (STEC) NOT DETECTED NOT DETECTED Final   Shigella/Enteroinvasive Mark coli (EIEC) NOT DETECTED NOT DETECTED Final   Cryptosporidium NOT DETECTED NOT DETECTED Final   Cyclospora cayetanensis NOT DETECTED NOT DETECTED Final   Entamoeba histolytica NOT DETECTED NOT DETECTED Final   Giardia lamblia NOT DETECTED NOT DETECTED Final   Adenovirus F40/41 NOT DETECTED NOT DETECTED Final   Astrovirus NOT DETECTED NOT DETECTED Final   Norovirus GI/GII NOT DETECTED NOT DETECTED Final   Rotavirus A NOT DETECTED NOT  DETECTED Final   Sapovirus (I, II, IV, and V) NOT DETECTED NOT DETECTED Final    Comment: Performed at [facility], [address] [place], [place] [address]     Labs: BNP (last 3 results) No results for input(s): BNP in the last 8760 hours. Basic Metabolic Panel: Recent Labs  Lab 04/01/21 0818 04/02/21 1151 04/03/21 0522 04/03/21 1332  NA 123* 128* 134*  --   K 3.5 2.7* 3.2* 4.4  CL 80* 96* 106  --   CO2 19* 21* 20*  --   GLUCOSE 150* 97 110*  --   BUN 76* 50* 27*  --   CREATININE 4.73* 1.66* 0.91  --   CALCIUM 8.8* 8.1* 8.1*  --   MG 2.9*  --   --   --   PHOS 11.1*  --   --   --    Liver Function Tests: Recent Labs  Lab 04/01/21 0818 04/02/21 1151 04/03/21 0522  AST 52* 43* 35  ALT 25 21 18   ALKPHOS 70 53 59  BILITOT 0.9 0.7 0.5  PROT 9.8* 7.0 6.3*  ALBUMIN 4.7 3.3* 3.0*   No results for input(s): LIPASE, AMYLASE in the last 168 hours. Recent Labs  Lab 04/01/21 1102  AMMONIA 18   CBC: Recent Labs  Lab 04/01/21 0818  WBC 4.7  HGB 17.1*  HCT 45.9  MCV 89.8  PLT 149*   Cardiac Enzymes: No results for input(s): CKTOTAL, CKMB, CKMBINDEX, TROPONINI in the last 168 hours. BNP: Invalid input(s): POCBNP CBG: No results for input(s): GLUCAP in the last 168 hours. D-Dimer No results for input(s): DDIMER in the last 72 hours. Hgb A1c No results for input(s): HGBA1C in the last 72 hours. Lipid Profile No results for input(s): CHOL, HDL, LDLCALC, TRIG, CHOLHDL, LDLDIRECT in the last 72 hours. Thyroid function studies No results for input(s): TSH, T4TOTAL, T3FREE, THYROIDAB in the last 72 hours.  Invalid input(s): FREET3 Anemia work up No results for input(s): VITAMINB12, FOLATE, FERRITIN, TIBC, IRON, RETICCTPCT in the last 72 hours. Urinalysis    Component Value Date/Time   COLORURINE YELLOW (A) 04/01/2021 1355   APPEARANCEUR CLOUDY (A) 04/01/2021 1355   LABSPEC 1.012 04/01/2021 1355   PHURINE 5.0 04/01/2021 1355   GLUCOSEU 50 (A)  04/01/2021 1355   HGBUR MODERATE (A) 04/01/2021 [place] 04/01/2021 [place] 04/01/2021 1355   PROTEINUR  NEGATIVE 04/01/2021 1355   NITRITE NEGATIVE 04/01/2021 1355   LEUKOCYTESUR MODERATE (A) 04/01/2021 1355   Sepsis Labs Invalid input(s): PROCALCITONIN,  WBC,  LACTICIDVEN Microbiology Recent Results (from the past 240 hour(s))  Resp Panel by RT-PCR (Flu A&B, Covid) Nasopharyngeal Swab     Status: None   Collection Time: 04/01/21  8:35 AM   Specimen: Nasopharyngeal Swab; Nasopharyngeal(NP) swabs in vial transport medium  Result Value Ref Range Status   SARS Coronavirus 2 by RT PCR NEGATIVE NEGATIVE Final    Comment: (NOTE) SARS-CoV-2 target nucleic acids are NOT DETECTED.  The SARS-CoV-2 RNA is generally detectable in upper respiratory specimens during the acute phase of infection. The lowest concentration of SARS-CoV-2 viral copies this assay can detect is 138 copies/mL. A negative result does not preclude SARS-Cov-2 infection and should not be used as the sole basis for treatment or other patient management decisions. A negative result may occur with  improper specimen collection/handling, submission of specimen other than nasopharyngeal swab, presence of viral mutation(s) within the areas targeted by this assay, and inadequate number of viral copies(<138 copies/mL). A negative result must be combined with clinical observations, patient history, and epidemiological information. The expected result is Negative.  Fact Sheet for Patients:  EntrepreneurPulse.com.au  Fact Sheet for Healthcare Providers:  IncredibleEmployment.be  This test is no t yet approved or cleared by the [place] FDA and  has been authorized for detection and/or diagnosis of SARS-CoV-2 by FDA under an Emergency Use Authorization (EUA). This EUA will remain  in effect (meaning this test can be used) for the duration of  the COVID-19 declaration under Section 564(b)(1) of the Act, 21 U.S.C.section 360bbb-3(b)(1), unless the authorization is terminated  or revoked sooner.       Influenza A by PCR NEGATIVE NEGATIVE Final   Influenza B by PCR NEGATIVE NEGATIVE Final    Comment: (NOTE) The Xpert Xpress SARS-CoV-2/FLU/RSV plus assay is intended as an aid in the diagnosis of influenza from Nasopharyngeal swab specimens and should not be used as a sole basis for treatment. Nasal washings and aspirates are unacceptable for Xpert Xpress SARS-CoV-2/FLU/RSV testing.  Fact Sheet for Patients: EntrepreneurPulse.com.au  Fact Sheet for Healthcare Providers: IncredibleEmployment.be  This test is not yet approved or cleared by the [place] FDA and has been authorized for detection and/or diagnosis of SARS-CoV-2 by FDA under an Emergency Use Authorization (EUA). This EUA will remain in effect (meaning this test can be used) for the duration of the COVID-19 declaration under Section 564(b)(1) of the Act, 21 U.S.C. section 360bbb-3(b)(1), unless the authorization is terminated or revoked.  Performed at [facility], [address] [place], [place] [address]   Urine Culture     Status: Abnormal   Collection Time: 04/01/21  1:57 PM   Specimen: Urine, Random  Result Value Ref Range Status   Specimen Description   Final    URINE, RANDOM Performed at [facility], [address], [place], [place] [address]    Special Requests   Final    NONE Performed at [facility], [address] [place], [place] [address]    Culture MULTIPLE SPECIES PRESENT, SUGGEST RECOLLECTION (A)  Final   Report Status 04/03/2021 FINAL  Final  C Difficile Quick Screen w PCR reflex     Status: None   Collection Time: 04/02/21  1:53 PM   Specimen: STOOL  Result Value Ref Range Status   C Diff antigen NEGATIVE NEGATIVE Final   C Diff  toxin NEGATIVE NEGATIVE Final    C Diff interpretation No C. difficile detected.  Final    Comment: Performed at [facility], [address] [place], [place] [address]  Gastrointestinal Panel by PCR , Stool     Status: Abnormal   Collection Time: 04/02/21  1:53 PM   Specimen: STOOL  Result Value Ref Range Status   Campylobacter species NOT DETECTED NOT DETECTED Final   Plesimonas shigelloides NOT DETECTED NOT DETECTED Final   Salmonella species DETECTED (A) NOT DETECTED Final    Comment: RESULT CALLED TO, READ BACK BY AND VERIFIED WITH: CHRIS VANNICK 04/02/21 1544 KLW    Yersinia enterocolitica NOT DETECTED NOT DETECTED Final   Vibrio species NOT DETECTED NOT DETECTED Final   Vibrio cholerae NOT DETECTED NOT DETECTED Final   Enteroaggregative Mark coli (EAEC) NOT DETECTED NOT DETECTED Final   Enteropathogenic Mark coli (EPEC) NOT DETECTED NOT DETECTED Final   Enterotoxigenic Mark coli (ETEC) NOT DETECTED NOT DETECTED Final   Shiga like toxin producing Mark coli (STEC) NOT DETECTED NOT DETECTED Final   Shigella/Enteroinvasive Mark coli (EIEC) NOT DETECTED NOT DETECTED Final   Cryptosporidium NOT DETECTED NOT DETECTED Final   Cyclospora cayetanensis NOT DETECTED NOT DETECTED Final   Entamoeba histolytica NOT DETECTED NOT DETECTED Final   Giardia lamblia NOT DETECTED NOT DETECTED Final   Adenovirus F40/41 NOT DETECTED NOT DETECTED Final   Astrovirus NOT DETECTED NOT DETECTED Final   Norovirus GI/GII NOT DETECTED NOT DETECTED Final   Rotavirus A NOT DETECTED NOT DETECTED Final   Sapovirus (I, II, IV, and V) NOT DETECTED NOT DETECTED Final    Comment: Performed at [facility], [address] [place], [place] [address]     Time coordinating discharge: Over 30 minutes  SIGNED:   Nolberto Hanlon, MD  [facility] 04/03/2021, 2:12 PM Pager   If 7PM-7AM, please contact night-coverage [email or web address] Password [email or web address]

## 2021-04-03 NOTE — Progress Notes (Signed)
SATURATION QUALIFICATIONS: (This note is used to comply with regulatory documentation for home oxygen)  Patient Saturations on Room Air at Rest = 100%  Patient Saturations on Room Air while Ambulating = 100%  Patient Saturations on 0 Liters of oxygen while Ambulating = n/a*%  Please briefly explain why patient needs home oxygen: No oxygen required

## 2021-04-03 NOTE — Telephone Encounter (Signed)
Copied from [place] [phone]. Topic: Appointment Scheduling - Scheduling Inquiry for Clinic >> Apr 03, 2021  1:39 PM Greggory Keen D wrote: Reason for CRM: Hospital called for a HFU for patient that is discharging today for hypometria  The foist appt was in August.  Can he be worked in sooner.  [phone]

## 2021-04-03 NOTE — Progress Notes (Signed)
PHARMACIST - PHYSICIAN COMMUNICATION  CONCERNING:  Enoxaparin (Lovenox) for DVT Prophylaxis    RECOMMENDATION: Patient was prescribed enoxaprin 30mg  q24 hours for VTE prophylaxis for CrCl <40ml/min  Filed Weights   04/01/21 0823 04/02/21 1128  Weight: 56.7 kg (125 lb) 46.4 kg (102 lb 4.7 oz)    Body mass index is 15.55 kg/m.  Estimated Creatinine Clearance: 53.8 mL/min (by C-G formula based on SCr of 0.91 mg/dL).   Patient is candidate for enoxaparin 40mg  every 24 hours based on CrCl >61ml/min and Weight >45kg  DESCRIPTION: Pharmacy has adjusted enoxaparin dose per [facility] policy.  Patient is now receiving enoxaparin 40 mg every 24 hours    Pernell Dupre, PharmD, BCPS Clinical Pharmacist 04/03/2021 8:16 AM

## 2021-04-03 NOTE — Plan of Care (Signed)
Pt Aox3, very forgetful. VSS. CIWA 0 overnight. OOB to [facility] several times with continued watery diarrhea. No c/o pain. Fall/safety precautions in place, rounding performed, needs/concerns addressed during shift.   Problem: Education: Goal: Knowledge of General Education information will improve Description: Including pain rating scale, medication(s)/side effects and non-pharmacologic comfort measures Outcome: Progressing   Problem: Health Behavior/Discharge Planning: Goal: Ability to manage health-related needs will improve Outcome: Progressing   Problem: Clinical Measurements: Goal: Ability to maintain clinical measurements within normal limits will improve Outcome: Progressing Goal: Will remain free from infection Outcome: Progressing Goal: Diagnostic test results will improve Outcome: Progressing Goal: Respiratory complications will improve Outcome: Progressing Goal: Cardiovascular complication will be avoided Outcome: Progressing   Problem: Activity: Goal: Risk for activity intolerance will decrease Outcome: Progressing   Problem: Nutrition: Goal: Adequate nutrition will be maintained Outcome: Progressing   Problem: Coping: Goal: Level of anxiety will decrease Outcome: Progressing   Problem: Elimination: Goal: Will not experience complications related to bowel motility Outcome: Progressing Goal: Will not experience complications related to urinary retention Outcome: Progressing   Problem: Pain Managment: Goal: General experience of comfort will improve Outcome: Progressing   Problem: Safety: Goal: Ability to remain free from injury will improve Outcome: Progressing   Problem: Skin Integrity: Goal: Risk for impaired skin integrity will decrease Outcome: Progressing

## 2021-04-03 NOTE — Telephone Encounter (Signed)
Can work in with any provider

## 2021-04-14 ENCOUNTER — Inpatient Hospital Stay: Payer: Self-pay | Admitting: Family Medicine

## 2021-04-19 ENCOUNTER — Encounter: Payer: Self-pay | Admitting: Internal Medicine

## 2021-04-19 ENCOUNTER — Inpatient Hospital Stay
Admission: EM | Admit: 2021-04-19 | Discharge: 2021-04-23 | DRG: 177 | Disposition: A | Discharge status: Home / Self Care | Payer: Self-pay | Attending: Internal Medicine | Admitting: Internal Medicine

## 2021-04-19 ENCOUNTER — Emergency Department: Payer: Self-pay

## 2021-04-19 ENCOUNTER — Other Ambulatory Visit: Payer: Self-pay

## 2021-04-19 DIAGNOSIS — J189 Pneumonia, unspecified organism: Secondary | ICD-10-CM

## 2021-04-19 DIAGNOSIS — F1020 Alcohol dependence, uncomplicated: Secondary | ICD-10-CM | POA: Diagnosis present

## 2021-04-19 DIAGNOSIS — F17200 Nicotine dependence, unspecified, uncomplicated: Secondary | ICD-10-CM | POA: Diagnosis present

## 2021-04-19 DIAGNOSIS — J1282 Pneumonia due to coronavirus disease 2019: Secondary | ICD-10-CM | POA: Diagnosis present

## 2021-04-19 DIAGNOSIS — U071 COVID-19: Principal | ICD-10-CM | POA: Diagnosis present

## 2021-04-19 DIAGNOSIS — E876 Hypokalemia: Secondary | ICD-10-CM | POA: Diagnosis present

## 2021-04-19 DIAGNOSIS — Z681 Body mass index (BMI) 19 or less, adult: Secondary | ICD-10-CM

## 2021-04-19 DIAGNOSIS — R531 Weakness: Secondary | ICD-10-CM

## 2021-04-19 DIAGNOSIS — F17210 Nicotine dependence, cigarettes, uncomplicated: Secondary | ICD-10-CM | POA: Diagnosis present

## 2021-04-19 DIAGNOSIS — Z79899 Other long term (current) drug therapy: Secondary | ICD-10-CM

## 2021-04-19 DIAGNOSIS — K7460 Unspecified cirrhosis of liver: Secondary | ICD-10-CM | POA: Diagnosis present

## 2021-04-19 DIAGNOSIS — E43 Unspecified severe protein-calorie malnutrition: Secondary | ICD-10-CM | POA: Diagnosis present

## 2021-04-19 DIAGNOSIS — Z28310 Unvaccinated for covid-19: Secondary | ICD-10-CM

## 2021-04-19 DIAGNOSIS — N390 Urinary tract infection, site not specified: Secondary | ICD-10-CM | POA: Diagnosis present

## 2021-04-19 DIAGNOSIS — K760 Fatty (change of) liver, not elsewhere classified: Secondary | ICD-10-CM | POA: Diagnosis present

## 2021-04-19 DIAGNOSIS — Z716 Tobacco abuse counseling: Secondary | ICD-10-CM

## 2021-04-19 DIAGNOSIS — D6959 Other secondary thrombocytopenia: Secondary | ICD-10-CM | POA: Diagnosis present

## 2021-04-19 DIAGNOSIS — R7881 Bacteremia: Secondary | ICD-10-CM | POA: Diagnosis present

## 2021-04-19 LAB — LACTIC ACID, PLASMA
Lactic Acid, Venous: 1.4 mmol/L (ref 0.5–1.9)
Lactic Acid, Venous: 1.2 mmol/L (ref 0.5–1.9)

## 2021-04-19 LAB — URINALYSIS, COMPLETE (UACMP) WITH MICROSCOPIC
Bacteria, UA: NONE SEEN
Bilirubin Urine: NEGATIVE
Glucose, UA: NEGATIVE mg/dL
Ketones, ur: NEGATIVE mg/dL
Nitrite: NEGATIVE
Protein, ur: NEGATIVE mg/dL
Specific Gravity, Urine: 1.01 (ref 1.005–1.030)
pH: 7 (ref 5.0–8.0)

## 2021-04-19 LAB — CBC WITH DIFFERENTIAL/PLATELET
Abs Immature Granulocytes: 0.02 10*3/uL (ref 0.00–0.07)
Basophils Absolute: 0 10*3/uL (ref 0.0–0.1)
Basophils Relative: 0 %
Eosinophils Absolute: 0 10*3/uL (ref 0.0–0.5)
Eosinophils Relative: 0 %
HCT: 35.2 % — ABNORMAL LOW (ref 39.0–52.0)
Hemoglobin: 12.8 g/dL — ABNORMAL LOW (ref 13.0–17.0)
Immature Granulocytes: 0 %
Lymphocytes Relative: 22 %
Lymphs Abs: 1.2 10*3/uL (ref 0.7–4.0)
MCH: 33.9 pg (ref 26.0–34.0)
MCHC: 36.4 g/dL — ABNORMAL HIGH (ref 30.0–36.0)
MCV: 93.1 fL (ref 80.0–100.0)
Monocytes Absolute: 0.4 10*3/uL (ref 0.1–1.0)
Monocytes Relative: 8 %
Neutro Abs: 3.7 10*3/uL (ref 1.7–7.7)
Neutrophils Relative %: 70 %
Platelets: 103 10*3/uL — ABNORMAL LOW (ref 150–400)
RBC: 3.78 MIL/uL — ABNORMAL LOW (ref 4.22–5.81)
RDW: 12.7 % (ref 11.5–15.5)
Smear Review: NORMAL
WBC: 5.3 10*3/uL (ref 4.0–10.5)
nRBC: 0 % (ref 0.0–0.2)

## 2021-04-19 LAB — PROTIME-INR
INR: 1.2 (ref 0.8–1.2)
Prothrombin Time: 15.1 seconds (ref 11.4–15.2)

## 2021-04-19 LAB — COMPREHENSIVE METABOLIC PANEL
ALT: 18 U/L (ref 0–44)
AST: 28 U/L (ref 15–41)
Albumin: 3.3 g/dL — ABNORMAL LOW (ref 3.5–5.0)
Alkaline Phosphatase: 67 U/L (ref 38–126)
Anion gap: 7 (ref 5–15)
BUN: 8 mg/dL (ref 8–23)
CO2: 25 mmol/L (ref 22–32)
Calcium: 7.8 mg/dL — ABNORMAL LOW (ref 8.9–10.3)
Chloride: 102 mmol/L (ref 98–111)
Creatinine, Ser: 0.73 mg/dL (ref 0.61–1.24)
GFR, Estimated: >60 mL/min (ref >60)
Glucose, Bld: 98 mg/dL (ref 70–99)
Potassium: 3.3 mmol/L — ABNORMAL LOW (ref 3.5–5.1)
Sodium: 134 mmol/L — ABNORMAL LOW (ref 135–145)
Total Bilirubin: 1.7 mg/dL — ABNORMAL HIGH (ref 0.3–1.2)
Total Protein: 6.9 g/dL (ref 6.5–8.1)

## 2021-04-19 LAB — RESP PANEL BY RT-PCR (FLU A&B, COVID) ARPGX2
Influenza A by PCR: NEGATIVE
Influenza B by PCR: NEGATIVE
SARS Coronavirus 2 by RT PCR: POSITIVE — AB

## 2021-04-19 LAB — PROCALCITONIN: Procalcitonin: 0.18 ng/mL

## 2021-04-19 IMAGING — CR DG CHEST 2V
2 series · 2 of 2 positions shown · non-contrast
Comparison: [DATE]

CLINICAL DATA: Suspected sepsis.  Fever

EXAM:
CHEST - 2 VIEW

[chest lat]
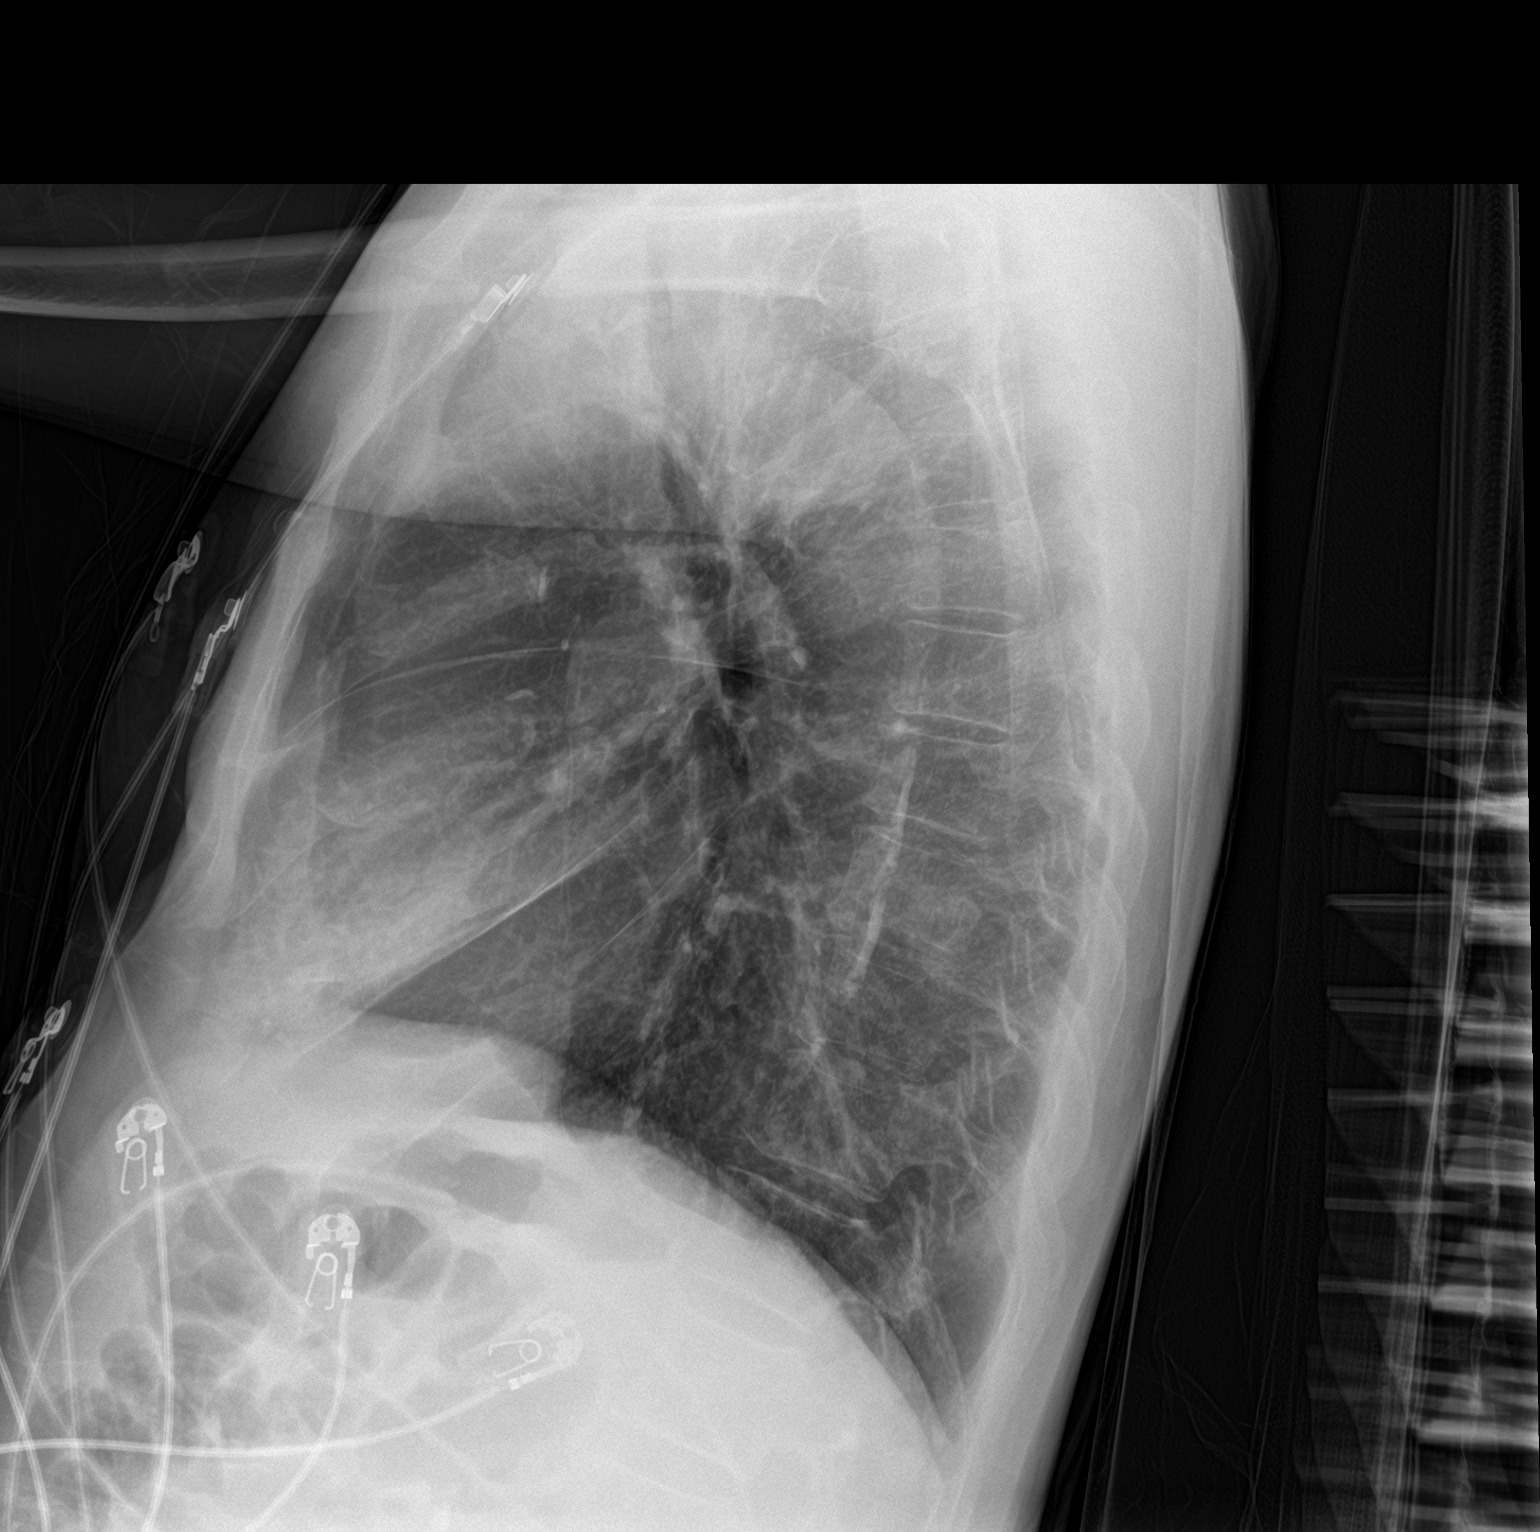

[chest ap]
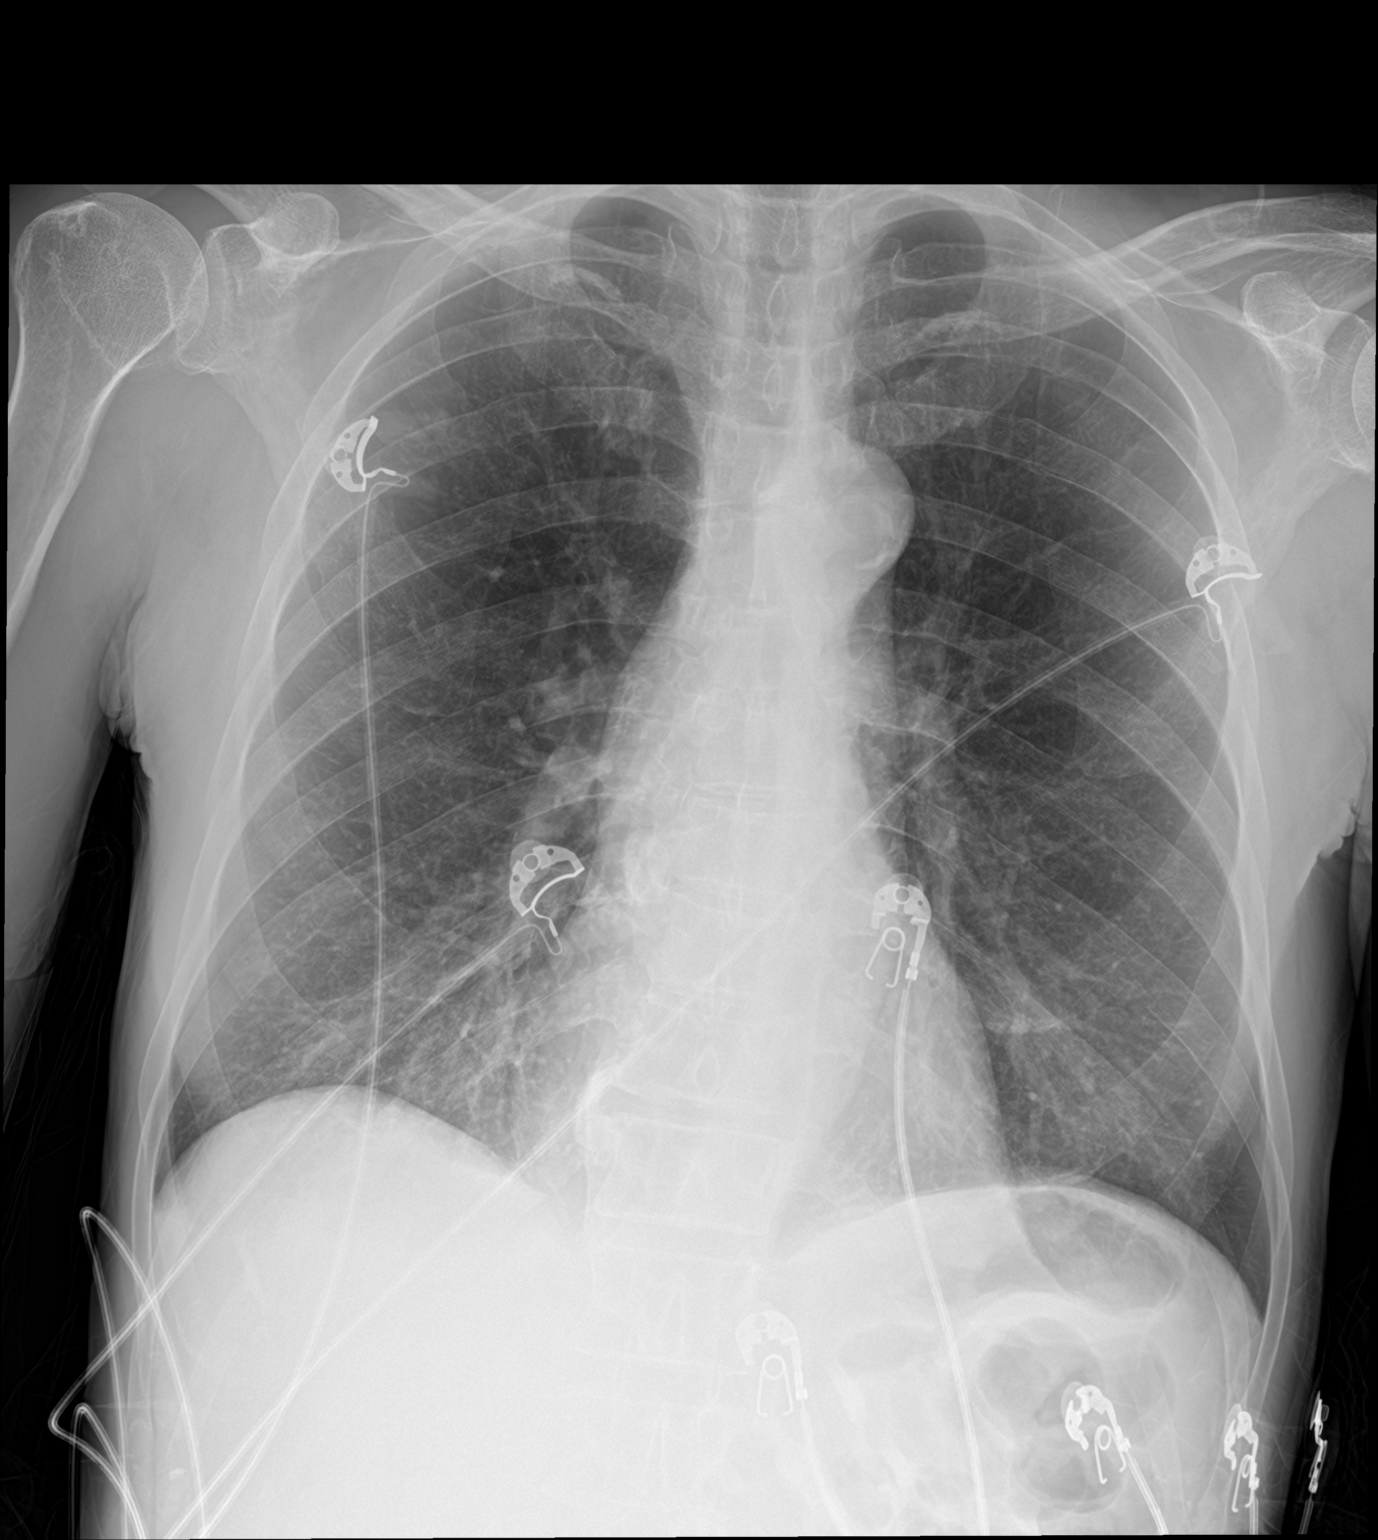

[2 of 2 positions shown; findings below may reference images not displayed]

FINDINGS: Right middle lobe pneumonia. No edema, effusion, or pneumothorax.
Normal heart size and mediastinal contours. Artifact from EKG leads.
IMPRESSION: Right middle lobe pneumonia.

## 2021-04-19 MED ORDER — LACTATED RINGERS IV BOLUS
1000.0000 mL | Freq: Once | INTRAVENOUS | Status: AC
  Administered 2021-04-19: 1000 mL via INTRAVENOUS

## 2021-04-19 MED ORDER — SODIUM CHLORIDE 0.9 % IV SOLN
2.0000 g | INTRAVENOUS | Status: DC
  Administered 2021-04-20: 11:00:00 2 g via INTRAVENOUS
  Filled 2021-04-19: qty 2

## 2021-04-19 MED ORDER — SODIUM CHLORIDE 0.9 % IV SOLN
100.0000 mg | Freq: Every day | INTRAVENOUS | Status: AC
  Administered 2021-04-20 – 2021-04-23 (×4): 100 mg via INTRAVENOUS
  Filled 2021-04-19 (×4): qty 100

## 2021-04-19 MED ORDER — SODIUM CHLORIDE 0.9 % IV SOLN: 250.0000 mL | INTRAVENOUS | Status: DC | PRN

## 2021-04-19 MED ORDER — SODIUM CHLORIDE 0.9 % IV SOLN
200.0000 mg | Freq: Once | INTRAVENOUS | Status: AC
  Administered 2021-04-19: 200 mg via INTRAVENOUS
  Filled 2021-04-19: qty 40

## 2021-04-19 MED ORDER — SODIUM CHLORIDE 0.9 % IV SOLN: 100.0000 mg | Freq: Every day | INTRAVENOUS | Status: DC

## 2021-04-19 MED ORDER — SODIUM CHLORIDE 0.9 % IV SOLN
1.0000 g | Freq: Once | INTRAVENOUS | Status: AC
  Administered 2021-04-19: 1 g via INTRAVENOUS
  Filled 2021-04-19: qty 10

## 2021-04-19 MED ORDER — ADULT MULTIVITAMIN W/MINERALS CH
1.0000 | ORAL_TABLET | Freq: Every day | ORAL | Status: DC
  Administered 2021-04-19 – 2021-04-23 (×5): 1 via ORAL
  Filled 2021-04-19 (×5): qty 1

## 2021-04-19 MED ORDER — ACETAMINOPHEN 500 MG PO TABS
1000.0000 mg | ORAL_TABLET | Freq: Once | ORAL | Status: AC
  Administered 2021-04-19: 1000 mg via ORAL
  Filled 2021-04-19: qty 2

## 2021-04-19 MED ORDER — AZITHROMYCIN 500 MG IV SOLR
500.0000 mg | INTRAVENOUS | Status: DC
  Administered 2021-04-20: 12:00:00 500 mg via INTRAVENOUS
  Filled 2021-04-19 (×2): qty 500

## 2021-04-19 MED ORDER — GUAIFENESIN-DM 100-10 MG/5ML PO SYRP: 10.0000 mL | ORAL_SOLUTION | ORAL | Status: DC | PRN

## 2021-04-19 MED ORDER — SODIUM CHLORIDE 0.9 % IV SOLN
500.0000 mg | Freq: Once | INTRAVENOUS | Status: AC
  Administered 2021-04-19: 500 mg via INTRAVENOUS
  Filled 2021-04-19: qty 500

## 2021-04-19 MED ORDER — ZINC SULFATE 220 (50 ZN) MG PO CAPS
220.0000 mg | ORAL_CAPSULE | Freq: Every day | ORAL | Status: DC
  Administered 2021-04-19 – 2021-04-23 (×5): 220 mg via ORAL
  Filled 2021-04-19 (×5): qty 1

## 2021-04-19 MED ORDER — ACETAMINOPHEN 325 MG PO TABS: 650.0000 mg | ORAL_TABLET | Freq: Four times a day (QID) | ORAL | Status: DC | PRN

## 2021-04-19 MED ORDER — SODIUM CHLORIDE 0.9 % IV SOLN
200.0000 mg | Freq: Once | INTRAVENOUS | Status: DC
  Filled 2021-04-19: qty 40

## 2021-04-19 MED ORDER — ONDANSETRON HCL 4 MG/2ML IJ SOLN: 4.0000 mg | Freq: Four times a day (QID) | INTRAMUSCULAR | Status: DC | PRN

## 2021-04-19 MED ORDER — SODIUM CHLORIDE 0.9% FLUSH: 3.0000 mL | INTRAVENOUS | Status: DC | PRN

## 2021-04-19 MED ORDER — FOLIC ACID 1 MG PO TABS
1.0000 mg | ORAL_TABLET | Freq: Every day | ORAL | Status: DC
  Administered 2021-04-19 – 2021-04-23 (×5): 1 mg via ORAL
  Filled 2021-04-19 (×5): qty 1

## 2021-04-19 MED ORDER — ONDANSETRON HCL 4 MG PO TABS: 4.0000 mg | ORAL_TABLET | Freq: Four times a day (QID) | ORAL | Status: DC | PRN

## 2021-04-19 MED ORDER — ALBUTEROL SULFATE HFA 108 (90 BASE) MCG/ACT IN AERS
2.0000 | INHALATION_SPRAY | Freq: Four times a day (QID) | RESPIRATORY_TRACT | Status: DC
  Administered 2021-04-19 – 2021-04-23 (×15): 2 via RESPIRATORY_TRACT
  Filled 2021-04-19: qty 6.7

## 2021-04-19 MED ORDER — SODIUM CHLORIDE 0.9% FLUSH
3.0000 mL | Freq: Two times a day (BID) | INTRAVENOUS | Status: DC
  Administered 2021-04-19 – 2021-04-23 (×9): 3 mL via INTRAVENOUS

## 2021-04-19 MED ORDER — POTASSIUM CHLORIDE CRYS ER 20 MEQ PO TBCR
40.0000 meq | EXTENDED_RELEASE_TABLET | Freq: Once | ORAL | Status: AC
  Administered 2021-04-19: 40 meq via ORAL
  Filled 2021-04-19: qty 2

## 2021-04-19 MED ORDER — ASCORBIC ACID 500 MG PO TABS
500.0000 mg | ORAL_TABLET | Freq: Every day | ORAL | Status: DC
  Administered 2021-04-19 – 2021-04-23 (×5): 500 mg via ORAL
  Filled 2021-04-19 (×5): qty 1

## 2021-04-19 MED ORDER — THIAMINE HCL 100 MG PO TABS
100.0000 mg | ORAL_TABLET | Freq: Every day | ORAL | Status: DC
  Administered 2021-04-19 – 2021-04-23 (×5): 100 mg via ORAL
  Filled 2021-04-19 (×5): qty 1

## 2021-04-19 NOTE — H&P (Signed)
History and Physical    Mark Carr FKC:[id number] DOB: [birth year] DOA: 04/19/2021  PCP: Virginia Crews, MD   Patient coming from: Home  I have personally briefly reviewed patient's old medical records in [place]  Chief Complaint: Dizziness/Weakness  HPI: Mark Carr is a 65 y.o. male with medical history significant for nicotine dependence, liver cirrhosis who was recently discharged from the hospital after treatment for Salmonella gastroenteritis/AKI and dehydration and a fall presents today via EMS for evaluation of fever and weakness.  Patient was found to have a T-max of 101F.  He complains of feeling dizzy and fell 1 day prior to his admission landing on his knees.  He denies any loss of consciousness or hitting his head.  He complains of poor oral intake over the last 24 hours, myalgias and a cough productive of clear phlegm.  He denies having any sick contacts.  Patient complains of feeling very weak and had difficulty getting up after he fell which is why his family called EMS. He denies having any chest pain, no shortness of breath, no nausea, no vomiting, no diarrhea, no headache, no blurred vision, no urinary symptoms, no focal deficits, no palpitations, no diaphoresis. Labs show sodium 134, potassium 3.3, chloride 102, bicarb 25, glucose 98, BUN 8, creatinine 0.73, calcium 7.8, alkaline phosphatase 67, albumin 3.3, AST 28, ALT 18, total protein 6.9, total bilirubin 1.7, lactic acid 1.4, white count 5.3, hemoglobin 12.8, hematocrit 35.2, MCV 93.1, RDW 12.7, platelet count 103 Patient's SARS coronavirus 2 point-of-care test is positive UA shows moderate leukocyte esterase, white cell clumps Chest x-ray reviewed by me shows right middle lobe pneumonia Twelve-lead EKG reviewed by me shows sinus rhythm    ED Course: Patient is a 65 year old African-American male recently discharged from the hospital after treatment for Salmonella gastroenteritis, dehydration and  hyponatremia.  He presents to the ER for evaluation of weakness, dizziness and a fall at home.  He was noted to have a fever with a T-max of 101F by EMS. His SARS coronavirus 2 point-of-care test is positive and chest x-ray shows a right middle lobe pneumonia.  Patient is not hypoxic on room air pulse oximetry at rest is 100%. He will be admitted to the hospital for further evaluation.    Review of Systems: As per HPI otherwise all other systems reviewed and negative.    Past Medical History:  Diagnosis Date   Cirrhosis of liver ([place])    Wrist fracture     Past Surgical History:  Procedure Laterality Date   WRIST FRACTURE SURGERY Right 2016     reports that he has been smoking cigarettes. He has a 20.00 pack-year smoking history. He has never used smokeless tobacco. He reports current alcohol use of about 4.0 standard drinks of alcohol per week. He reports previous drug use.  No Known Allergies  Family History  Problem Relation Age of Onset   Hypertension Mother    Deep vein thrombosis Mother    Cancer Sister        unknown type   Heart disease Brother    Cancer Maternal Grandmother       Prior to Admission medications   Medication Sig Start Date End Date Taking? Authorizing Provider  folic acid (FOLVITE) 1 MG tablet Take 1 tablet (1 mg total) by mouth daily. 04/04/21 05/04/21  Nolberto Hanlon, MD  Multiple Vitamin (MULTIVITAMIN WITH MINERALS) TABS tablet Take 1 tablet by mouth daily. 04/04/21 05/04/21  Nolberto Hanlon, MD  naltrexone (DEPADE) 50 MG tablet Take 1 tablet (50 mg total) by mouth daily. Patient not taking: Reported on 04/01/2021 11/29/19   Virginia Crews, MD  thiamine 100 MG tablet Take 1 tablet (100 mg total) by mouth daily. 04/04/21 05/04/21  Nolberto Hanlon, MD    Physical Exam: Vitals:   04/19/21 0545 04/19/21 0547 04/19/21 0600 04/19/21 0643  BP: 134/88   125/78  Pulse: (!) 105   83  Resp: 16   16  Temp: 99.6 F (37.6 C)  100.3 F (37.9 C)   TempSrc: Oral   Rectal   SpO2: 98%   98%  Weight:  54.4 kg    Height:  5\' 8"  (1.727 m)       Vitals:   04/19/21 0545 04/19/21 0547 04/19/21 0600 04/19/21 0643  BP: 134/88   125/78  Pulse: (!) 105   83  Resp: 16   16  Temp: 99.6 F (37.6 C)  100.3 F (37.9 C)   TempSrc: Oral  Rectal   SpO2: 98%   98%  Weight:  54.4 kg    Height:  5\' 8"  (1.727 m)        Constitutional: Alert and oriented x 3 . Not in any apparent distress.  Appears comfortable HEENT:      Head: Normocephalic and atraumatic.         Eyes: PERLA, EOMI, Conjunctivae are normal. Sclera is non-icteric.       Mouth/Throat: Mucous membranes are moist.       Neck: Supple with no signs of meningismus. Cardiovascular: Regular rate and rhythm. No murmurs, gallops, or rubs. 2+ symmetrical distal pulses are present . No JVD. No LE edema Respiratory: Respiratory effort normal .bilateral air entry  Gastrointestinal: Soft, non tender, and non distended with positive bowel sounds.  Genitourinary: No CVA tenderness. Musculoskeletal: Nontender with normal range of motion in all extremities. No cyanosis, or erythema of extremities. Neurologic:  Face is symmetric. Moving all extremities. No gross focal neurologic deficits to my daughter she is crazy. Skin: Skin is warm, dry.  No rash or ulcers Psychiatric: Mood and affect are normal    Labs on Admission: I have personally reviewed following labs and imaging studies  CBC: Recent Labs  Lab 04/19/21 0604  WBC 5.3  NEUTROABS 3.7  HGB 12.8*  HCT 35.2*  MCV 93.1  PLT 751*   Basic Metabolic Panel: Recent Labs  Lab 04/19/21 0604  NA 134*  K 3.3*  CL 102  CO2 25  GLUCOSE 98  BUN 8  CREATININE 0.73  CALCIUM 7.8*   GFR: Estimated Creatinine Clearance: 71.8 mL/min (by C-G formula based on SCr of 0.73 mg/dL). Liver Function Tests: Recent Labs  Lab 04/19/21 0604  AST 28  ALT 18  ALKPHOS 67  BILITOT 1.7*  PROT 6.9  ALBUMIN 3.3*   No results for input(s): LIPASE, AMYLASE in  the last 168 hours. No results for input(s): AMMONIA in the last 168 hours. Coagulation Profile: Recent Labs  Lab 04/19/21 0604  INR 1.2   Cardiac Enzymes: No results for input(s): CKTOTAL, CKMB, CKMBINDEX, TROPONINI in the last 168 hours. BNP (last 3 results) No results for input(s): PROBNP in the last 8760 hours. HbA1C: No results for input(s): HGBA1C in the last 72 hours. CBG: No results for input(s): GLUCAP in the last 168 hours. Lipid Profile: No results for input(s): CHOL, HDL, LDLCALC, TRIG, CHOLHDL, LDLDIRECT in the last 72 hours. Thyroid Function Tests: No results for input(s): TSH, T4TOTAL, FREET4,  T3FREE, THYROIDAB in the last 72 hours. Anemia Panel: No results for input(s): VITAMINB12, FOLATE, FERRITIN, TIBC, IRON, RETICCTPCT in the last 72 hours. Urine analysis:    Component Value Date/Time   COLORURINE YELLOW (A) 04/19/2021 0604   APPEARANCEUR CLEAR (A) 04/19/2021 0604   LABSPEC 1.010 04/19/2021 0604   PHURINE 7.0 04/19/2021 0604   GLUCOSEU NEGATIVE 04/19/2021 0604   HGBUR SMALL (A) 04/19/2021 0604   BILIRUBINUR NEGATIVE 04/19/2021 0604   KETONESUR NEGATIVE 04/19/2021 0604   PROTEINUR NEGATIVE 04/19/2021 0604   NITRITE NEGATIVE 04/19/2021 0604   LEUKOCYTESUR MODERATE (A) 04/19/2021 0604    Radiological Exams on Admission: DG Chest 2 View  Result Date: 04/19/2021 CLINICAL DATA:  Suspected sepsis.  Fever EXAM: CHEST - 2 VIEW COMPARISON:  10/28/2018 FINDINGS: Right middle lobe pneumonia. No edema, effusion, or pneumothorax. Normal heart size and mediastinal contours. Artifact from EKG leads. IMPRESSION: Right middle lobe pneumonia. Electronically Signed   By: Monte Fantasia M.D.   On: 04/19/2021 06:39     Assessment/Plan Principal Problem:   Pneumonia due to COVID-19 virus Active Problems:   Tobacco use disorder   Cirrhosis of liver (HCC)   UTI (urinary tract infection)   Hypokalemia    Pneumonia due to COVID-19 virus ??  Superimposed bacterial  pneumonia in a patient with COVID-19 viral infection Patient is not hypoxic on room air pulse oximetry at rest is 100% He is unvaccinated against the COVID-19 virus We will place patient on remdesivir per protocol Supportive care with bronchodilator therapy and antitussives Awaiting results of procalcitonin level We will place patient empirically on Rocephin and Zithromax Follow-up results of blood cultures    Nicotine dependence Smoking cessation has been discussed with patient in detail We will place patient on nicotine transdermal patch 6 mg daily    Hypokalemia Supplement potassium Check magnesium levels    UTI Patient has pyuria Continue empiric antibiotic therapy with Rocephin Follow-up urine culture results    History of alcohol dependence Continue folic acid and thiamine  DVT prophylaxis: SCD due to thrombocytopenia Code Status: full code  Family Communication: Greater than 50% of time was spent discussing plan of care with patient at the bedside.  All questions and concerns have been addressed.  He verbalizes understanding and agrees with the plan. Disposition Plan: Back to previous home environment Consults called: none  Status: At the time of admission, it appears that the appropriate admission status for this patient is inpatient. This is judged to be reasonable and necessary in order to provide the required intensity of service to ensure the patient's safety given the presenting symptoms, physical exam findings and initial radiographic and laboratory data in the context of their comorbid conditions. Patient requires inpatient status due to high intensity of service, high risk for further deterioration and high frequency of surveillance required.    Collier Bullock MD [facility]     04/19/2021, 8:50 AM

## 2021-04-19 NOTE — ED Provider Notes (Signed)
[facility] Emergency Department Provider Note ____________________________________________   Event Date/Time   First MD Initiated Contact with Patient 04/19/21 [id number]     (approximate)  I have reviewed the triage vital signs and the nursing notes.  HISTORY  Chief Complaint Code Sepsis   HPI Mark Carr is a 65 y.o. malewho presents to the ED for evaluation of possible sepsis.  Chart review indicates patient was recently admitted 5/24-5/26 due to weakness and diarrhea, found to have hyponatremia and Salmonella diarrhea with prerenal AKI and dehydration.  Further history of alcohol and tobacco abuse and cirrhotic liver.  Patient presents to the ED for evaluation of generalized weakness and an episode of dizziness, causing him to fall.  He reports has been doing well since his recent admission, finished his course of ciprofloxacin and his stools have improved and formed up to be more solid now.  Reports has not been drinking ethanol or using cocaine since that admission.    Reports for the past 24 hours he felt generalized weakness and myalgias.  Also reports a cough with some increased sputum production.  Denies any focal symptoms such as abdominal pain, chest pain, emesis, or shortness of breath.  He does report an episode of dizziness upon standing, causing him to fall forward onto his knees and catch himself.  He reports feeling quite weak while on the ground, and was having difficulty getting up, so his children called 911 to get him evaluated.  Noted to have a temperature of 100.3 here, he denies having had any fevers at home.   Received a liter of normal saline with EMS and says he feels a lot better now.  He has no complaints here in the ED and says he feels fine.  Past Medical History:  Diagnosis Date   Cirrhosis of liver ([place])    Wrist fracture     Patient Active Problem List   Diagnosis Date Noted   Protein-calorie malnutrition, severe 04/02/2021    Hyponatremia 04/01/2021   UTI (urinary tract infection) 04/01/2021   Acute renal injury ([place]) 04/01/2021   [place]    Fall at home, initial encounter    Diarrhea         Generalized weakness    Uninsured 11/29/2019   Unintentional weight loss 11/29/2019   Tobacco use disorder 05/18/2019   Macrocytosis 05/18/2019   Elevated LFTs 05/18/2019   Steatosis of liver 05/18/2019   Chronic fatigue 05/18/2019   Colon cancer screening 05/18/2019   Alcohol use 05/18/2019    Past Surgical History:  Procedure Laterality Date   WRIST FRACTURE SURGERY Right 2016    Prior to Admission medications   Medication Sig Start Date End Date Taking? Authorizing Provider  folic acid (FOLVITE) 1 MG tablet Take 1 tablet (1 mg total) by mouth daily. 04/04/21 05/04/21  Nolberto Hanlon, MD  Multiple Vitamin (MULTIVITAMIN WITH MINERALS) TABS tablet Take 1 tablet by mouth daily. 04/04/21 05/04/21  Nolberto Hanlon, MD  naltrexone (DEPADE) 50 MG tablet Take 1 tablet (50 mg total) by mouth daily. Patient not taking: Reported on 04/01/2021 11/29/19   Virginia Crews, MD  thiamine 100 MG tablet Take 1 tablet (100 mg total) by mouth daily. 04/04/21 05/04/21  Nolberto Hanlon, MD    Allergies Patient has no known allergies.  Family History  Problem Relation Age of Onset   Hypertension Mother    Deep vein thrombosis Mother    Cancer Sister  unknown type   Heart disease Brother    Cancer Maternal Grandmother     Social History Social History   Tobacco Use   Smoking status: Every Day    Packs/day: 0.50    Years: 40.00    Pack years: 20.00    Types: Cigarettes   Smokeless tobacco: Never  Vaping Use   Vaping Use: Never used  Substance Use Topics   Alcohol use: Yes    Alcohol/week: 4.0 standard drinks    Types: 4 Cans of beer per week    Comment: 4 beers a day   Drug use: Not Currently    Review of Systems  Constitutional: No fever/chills.  Positive for generalized weakness Eyes: No  visual changes. ENT: No sore throat. Cardiovascular: Denies chest pain. Respiratory: Denies shortness of breath. Gastrointestinal: No abdominal pain.  No nausea, no vomiting.  No diarrhea.  No constipation. Genitourinary: Negative for dysuria. Musculoskeletal: Negative for back pain.  Positive for fall onto his knees. Skin: Negative for rash. Neurological: Negative for headaches, focal weakness or numbness. ____________________________________________   PHYSICAL EXAM:  VITAL SIGNS: Vitals:   04/19/21 0600 04/19/21 0643  BP:  125/78  Pulse:  83  Resp:  16  Temp: 100.3 F (37.9 C)   SpO2:  98%     Constitutional: Alert and oriented. Well appearing and in no acute distress.  Pleasant and conversational. Eyes: Conjunctivae are normal. PERRL. EOMI. Head: Atraumatic. Nose: No congestion/rhinnorhea. Mouth/Throat: Mucous membranes are dry.  Oropharynx non-erythematous. Neck: No stridor. No cervical spine tenderness to palpation. Cardiovascular: Tachycardic rate, regular rhythm. Grossly normal heart sounds.  Good peripheral circulation. Respiratory: Normal respiratory effort.  No retractions. Lungs CTAB. Gastrointestinal: Soft , nondistended, nontender to palpation. No CVA tenderness.  Benign abdomen throughout Musculoskeletal: No lower extremity tenderness nor edema.  No joint effusions. No signs of acute trauma. Neurologic:  Normal speech and language. No gross focal neurologic deficits are appreciated.  Cranial nerves II through XII intact 5/5 strength and sensation in all 4 extremities Skin:  Skin is warm, dry and intact. No rash noted. Psychiatric: Mood and affect are normal. Speech and behavior are normal.  ____________________________________________   LABS (all labs ordered are listed, but only abnormal results are displayed)  Labs Reviewed  RESP PANEL BY RT-PCR (FLU A&B, COVID) ARPGX2 - Abnormal; Notable for the following components:      Result Value   SARS  Coronavirus 2 by RT PCR POSITIVE (*)    All other components within normal limits  COMPREHENSIVE METABOLIC PANEL - Abnormal; Notable for the following components:   Sodium 134 (*)    Potassium 3.3 (*)    Calcium 7.8 (*)    Albumin 3.3 (*)    Total Bilirubin 1.7 (*)    All other components within normal limits  URINALYSIS, COMPLETE (UACMP) WITH MICROSCOPIC - Abnormal; Notable for the following components:   Color, Urine YELLOW (*)    APPearance CLEAR (*)    Hgb urine dipstick SMALL (*)    Leukocytes,Ua MODERATE (*)    All other components within normal limits  CULTURE, BLOOD (ROUTINE X 2)  CULTURE, BLOOD (ROUTINE X 2)  URINE CULTURE  LACTIC ACID, PLASMA  PROTIME-INR  LACTIC ACID, PLASMA  CBC WITH DIFFERENTIAL/PLATELET   ____________________________________________  12 Lead EKG  Sinus rhythm, rate of 97 bpm.  Normal axis and intervals.  No evidence of acute ischemia. ____________________________________________  RADIOLOGY  ED MD interpretation: 2 view CXR reviewed by me with streaky RML opacities  Official radiology report(s): DG Chest 2 View  Result Date: 04/19/2021 CLINICAL DATA:  Suspected sepsis.  Fever EXAM: CHEST - 2 VIEW COMPARISON:  10/28/2018 FINDINGS: Right middle lobe pneumonia. No edema, effusion, or pneumothorax. Normal heart size and mediastinal contours. Artifact from EKG leads. IMPRESSION: Right middle lobe pneumonia. Electronically Signed   By: Monte Fantasia M.D.   On: 04/19/2021 06:39    ____________________________________________   PROCEDURES and INTERVENTIONS  Procedure(s) performed (including Critical Care):  .1-3 Lead EKG Interpretation  Date/Time: 04/19/2021 7:20 AM Performed by: Vladimir Crofts, MD Authorized by: Vladimir Crofts, MD     Interpretation: normal     ECG rate:  80   ECG rate assessment: normal     Rhythm: sinus rhythm     Ectopy: none     Conduction: normal    Medications  cefTRIAXone (ROCEPHIN) 1 g in sodium chloride 0.9 %  100 mL IVPB (has no administration in time range)  azithromycin (ZITHROMAX) 500 mg in sodium chloride 0.9 % 250 mL IVPB (has no administration in time range)  acetaminophen (TYLENOL) tablet 1,000 mg (1,000 mg Oral Given 04/19/21 0603)  lactated ringers bolus 1,000 mL (1,000 mLs Intravenous New Bag/Given 04/19/21 0647)    ____________________________________________   MDM / ED COURSE   65 year old male presents to the ED with 1-2 days of generalized weakness, myalgias and productive cough, with evidence of COVID-19 and possibly CAP, requiring medical admission.  Presents with low-grade temperature 100.3 F, tachycardic improving with fluids and otherwise normal vitals on room air.  Exam is generally reassuring without tachypnea, distress, neurologic or vascular deficits.  Generally looks pretty well.  Urinalysis with moderate leukocytes, but patient has no urinary symptoms, so we will send for culture.  CXR noted to have RML opacity and he does have increased sputum production and cough, concerning for CAP.  Cultures were drawn and patient was started on Rocephin and azithromycin to treat this.  Further tests positive for COVID-19.  Considering his generalized weakness, falls and discomfort going home, I think it be reasonable to admit under observation medicine for further work-up and management.      ____________________________________________   FINAL CLINICAL IMPRESSION(S) / ED DIAGNOSES  Final diagnoses:  COVID-19  Community acquired pneumonia of right middle lobe of lung  Weakness generalized     ED Discharge Orders     None        Srihith Aquilino   Note:  This document was prepared using Systems analyst and may include unintentional dictation errors.    Vladimir Crofts, MD 04/19/21 [id number]

## 2021-04-19 NOTE — Consult Note (Signed)
Remdesivir - Pharmacy Brief Note   O:  ALT: 18 CXR: Right middle lobe pneumonia SpO2: On room air   A/P:  04/19/21 SARS-CoV-2 PCR (+)  Remdesivir 200 mg IVPB once followed by 100 mg IVPB daily x 4 days.   Benita Gutter  04/19/2021 7:20 AM

## 2021-04-19 NOTE — ED Notes (Signed)
Report given to Irean Hong RN at this time.

## 2021-04-19 NOTE — ED Notes (Signed)
Patient ambulatory to toilet independently. 

## 2021-04-19 NOTE — ED Triage Notes (Signed)
Pt from home via ems for c/o fever tmax 101 and general weakness, was here few wks ago for salmonella/dehydration

## 2021-04-20 ENCOUNTER — Encounter: Payer: Self-pay | Admitting: Internal Medicine

## 2021-04-20 LAB — CBC WITH DIFFERENTIAL/PLATELET
Abs Immature Granulocytes: 0.01 10*3/uL (ref 0.00–0.07)
Basophils Absolute: 0 10*3/uL (ref 0.0–0.1)
Basophils Relative: 0 %
Eosinophils Absolute: 0 10*3/uL (ref 0.0–0.5)
Eosinophils Relative: 1 %
HCT: 34.6 % — ABNORMAL LOW (ref 39.0–52.0)
Hemoglobin: 12.4 g/dL — ABNORMAL LOW (ref 13.0–17.0)
Immature Granulocytes: 0 %
Lymphocytes Relative: 25 %
Lymphs Abs: 1.1 10*3/uL (ref 0.7–4.0)
MCH: 33.6 pg (ref 26.0–34.0)
MCHC: 35.8 g/dL (ref 30.0–36.0)
MCV: 93.8 fL (ref 80.0–100.0)
Monocytes Absolute: 0.3 10*3/uL (ref 0.1–1.0)
Monocytes Relative: 6 %
Neutro Abs: 2.8 10*3/uL (ref 1.7–7.7)
Neutrophils Relative %: 68 %
Platelets: 119 10*3/uL — ABNORMAL LOW (ref 150–400)
RBC: 3.69 MIL/uL — ABNORMAL LOW (ref 4.22–5.81)
RDW: 12.8 % (ref 11.5–15.5)
WBC: 4.2 10*3/uL (ref 4.0–10.5)
nRBC: 0 % (ref 0.0–0.2)

## 2021-04-20 LAB — BLOOD CULTURE ID PANEL (REFLEXED) - BCID2

## 2021-04-20 LAB — COMPREHENSIVE METABOLIC PANEL
ALT: 16 U/L (ref 0–44)
AST: 21 U/L (ref 15–41)
Albumin: 2.9 g/dL — ABNORMAL LOW (ref 3.5–5.0)
Alkaline Phosphatase: 59 U/L (ref 38–126)
Anion gap: 4 — ABNORMAL LOW (ref 5–15)
BUN: 9 mg/dL (ref 8–23)
CO2: 25 mmol/L (ref 22–32)
Calcium: 8.3 mg/dL — ABNORMAL LOW (ref 8.9–10.3)
Chloride: 108 mmol/L (ref 98–111)
Creatinine, Ser: 0.66 mg/dL (ref 0.61–1.24)
GFR, Estimated: >60 mL/min (ref >60)
Glucose, Bld: 82 mg/dL (ref 70–99)
Potassium: 3.8 mmol/L (ref 3.5–5.1)
Sodium: 137 mmol/L (ref 135–145)
Total Bilirubin: 1 mg/dL (ref 0.3–1.2)
Total Protein: 6.5 g/dL (ref 6.5–8.1)

## 2021-04-20 LAB — FERRITIN: Ferritin: 574 ng/mL — ABNORMAL HIGH (ref 24–336)

## 2021-04-20 LAB — PHOSPHORUS: Phosphorus: 2.6 mg/dL (ref 2.5–4.6)

## 2021-04-20 LAB — PROCALCITONIN: Procalcitonin: 0.23 ng/mL

## 2021-04-20 LAB — URINE CULTURE: Culture: NO GROWTH

## 2021-04-20 LAB — MAGNESIUM: Magnesium: 1.7 mg/dL (ref 1.7–2.4)

## 2021-04-20 LAB — D-DIMER, QUANTITATIVE: D-Dimer, Quant: 0.53 ug/mL-FEU — ABNORMAL HIGH (ref 0.00–0.50)

## 2021-04-20 LAB — C-REACTIVE PROTEIN: CRP: 7.4 mg/dL — ABNORMAL HIGH (ref <1.0)

## 2021-04-20 MED ORDER — AZITHROMYCIN 500 MG PO TABS
500.0000 mg | ORAL_TABLET | Freq: Every day | ORAL | Status: DC
  Administered 2021-04-21: 500 mg via ORAL
  Filled 2021-04-20: qty 1

## 2021-04-20 MED ORDER — ENOXAPARIN SODIUM 40 MG/0.4ML IJ SOSY
40.0000 mg | PREFILLED_SYRINGE | INTRAMUSCULAR | Status: DC
  Administered 2021-04-20 – 2021-04-22 (×3): 40 mg via SUBCUTANEOUS
  Filled 2021-04-20 (×3): qty 0.4

## 2021-04-20 MED ORDER — CEFAZOLIN SODIUM-DEXTROSE 2-4 GM/100ML-% IV SOLN
2.0000 g | Freq: Three times a day (TID) | INTRAVENOUS | Status: DC
  Administered 2021-04-20 – 2021-04-23 (×9): 2 g via INTRAVENOUS
  Filled 2021-04-20 (×10): qty 100

## 2021-04-20 NOTE — Progress Notes (Signed)
PHARMACY - PHYSICIAN COMMUNICATION CRITICAL VALUE ALERT - BLOOD CULTURE IDENTIFICATION (BCID)  Mark Carr is an 65 y.o. male who presented to [facility] on 04/19/2021 with a chief complaint of PNA  Assessment:  Staph lugdunensis in 1 of 4 bottles , no resistance detected.   Most likely a contaminant.  (include suspected source if known)  Name of physician (or Provider) Contacted: Rachael Fee  Current antibiotics: ceftriaxone, azithromycin  Changes to prescribed antibiotics recommended:  Patient is on recommended antibiotics - No changes needed  Results for orders placed or performed during the hospital encounter of 04/19/21  Blood Culture ID Panel (Reflexed) (Collected: 04/19/2021  6:04 AM)  Result Value Ref Range   Enterococcus faecalis NOT DETECTED NOT DETECTED   Enterococcus Faecium NOT DETECTED NOT DETECTED   Listeria monocytogenes NOT DETECTED NOT DETECTED   Staphylococcus species DETECTED (A) NOT DETECTED   Staphylococcus aureus (BCID) NOT DETECTED NOT DETECTED   Staphylococcus epidermidis NOT DETECTED NOT DETECTED   Staphylococcus lugdunensis DETECTED (A) NOT DETECTED   Streptococcus species NOT DETECTED NOT DETECTED   Streptococcus agalactiae NOT DETECTED NOT DETECTED   Streptococcus pneumoniae NOT DETECTED NOT DETECTED   Streptococcus pyogenes NOT DETECTED NOT DETECTED   A.calcoaceticus-baumannii NOT DETECTED NOT DETECTED   Bacteroides fragilis NOT DETECTED NOT DETECTED   Enterobacterales NOT DETECTED NOT DETECTED   Enterobacter cloacae complex NOT DETECTED NOT DETECTED   Escherichia coli NOT DETECTED NOT DETECTED   Klebsiella aerogenes NOT DETECTED NOT DETECTED   Klebsiella oxytoca NOT DETECTED NOT DETECTED   Klebsiella pneumoniae NOT DETECTED NOT DETECTED   Proteus species NOT DETECTED NOT DETECTED   Salmonella species NOT DETECTED NOT DETECTED   Serratia marcescens NOT DETECTED NOT DETECTED   Haemophilus influenzae NOT DETECTED NOT DETECTED   Neisseria  meningitidis NOT DETECTED NOT DETECTED   Pseudomonas aeruginosa NOT DETECTED NOT DETECTED   Stenotrophomonas maltophilia NOT DETECTED NOT DETECTED   Candida albicans NOT DETECTED NOT DETECTED   Candida auris NOT DETECTED NOT DETECTED   Candida glabrata NOT DETECTED NOT DETECTED   Candida krusei NOT DETECTED NOT DETECTED   Candida parapsilosis NOT DETECTED NOT DETECTED   Candida tropicalis NOT DETECTED NOT DETECTED   Cryptococcus neoformans/gattii NOT DETECTED NOT DETECTED   Methicillin resistance mecA/C NOT DETECTED NOT DETECTED    [name] D 04/20/2021  2:46 AM

## 2021-04-20 NOTE — Progress Notes (Signed)
Progress Note    Mark Carr  ASN:[id number] DOB: 1956/03/10  DOA: 04/19/2021 PCP: Virginia Crews, MD      Brief Narrative:    Medical records reviewed and are as summarized below:  Mark Carr is a 65 y.o. male with medical history significant for nicotine dependence, liver cirrhosis, history of alcohol use disorder, who was recently discharged from the hospital after treatment for Salmonella gastroenteritis, AKI and and a fall.  He presented to the hospital because of fever (T-max 101 F) generalized weakness and dizziness.  He tested positive COVID-19 infection.      Assessment/Plan:   Principal Problem:   Pneumonia due to COVID-19 virus Active Problems:   Tobacco use disorder   Cirrhosis of liver (HCC)   UTI (urinary tract infection)   Hypokalemia    Body mass index is 18.25 kg/m.   Fever, COVID-19 pneumonia, suspected superimposed bacterial infection, Staph lugdunensis bacteremia: 1 out of 4 bottles showed staph lugdunensis.  Unclear whether this is a contaminant or not.  Continue IV remdesivir, steroids and empiric IV antibiotics.  Consider ID consult tomorrow.  Abnormal urinalysis: Asymptomatic.  He is on IV antibiotics anyway.  Follow-up urine culture.  Liver cirrhosis: Compensated  Tobacco use disorder: Counseled to quit smoking cigarettes.  History of alcohol use disorder: He said he quits drinking alcohol about a month prior to admission.   Diet Order             Diet regular Room service appropriate? Yes; Fluid consistency: Thin  Diet effective now                      Consultants: None  Procedures: None    Medications:    albuterol  2 puff Inhalation Q6H   vitamin C  500 mg Oral Daily   [START ON 04/21/2021] azithromycin  500 mg Oral Daily   enoxaparin (LOVENOX) injection  40 mg Subcutaneous L93X   folic acid  1 mg Oral Daily   multivitamin with minerals  1 tablet Oral Daily   sodium chloride flush  3 mL  Intravenous Q12H   thiamine  100 mg Oral Daily   zinc sulfate  220 mg Oral Daily   Continuous Infusions:  sodium chloride      ceFAZolin (ANCEF) IV     remdesivir 100 mg in NS 100 mL Stopped (04/20/21 1142)     Anti-infectives (From admission, onward)    Start     Dose/Rate Route Frequency Ordered Stop   04/21/21 1000  azithromycin (ZITHROMAX) tablet 500 mg        500 mg Oral Daily 04/20/21 1238 04/24/21 0959   04/20/21 2200  ceFAZolin (ANCEF) IVPB 2g/100 mL premix        2 g 200 mL/hr over 30 Minutes Intravenous Every 8 hours 04/20/21 1131     04/20/21 1000  remdesivir 100 mg in sodium chloride 0.9 % 100 mL IVPB  Status:  Discontinued       See Hyperspace for full Linked Orders Report.   100 mg 200 mL/hr over 30 Minutes Intravenous Daily 04/19/21 0722 04/19/21 0844   04/20/21 1000  remdesivir 100 mg in sodium chloride 0.9 % 100 mL IVPB       See Hyperspace for full Linked Orders Report.   100 mg 200 mL/hr over 30 Minutes Intravenous Daily 04/19/21 0843 04/24/21 0959   04/20/21 1000  cefTRIAXone (ROCEPHIN) 2 g in sodium chloride 0.9 % 100  mL IVPB  Status:  Discontinued        2 g 200 mL/hr over 30 Minutes Intravenous Every 24 hours 04/19/21 0844 04/20/21 1131   04/20/21 1000  azithromycin (ZITHROMAX) 500 mg in sodium chloride 0.9 % 250 mL IVPB  Status:  Discontinued        500 mg 250 mL/hr over 60 Minutes Intravenous Every 24 hours 04/19/21 0844 04/20/21 1238   04/19/21 1000  remdesivir 200 mg in sodium chloride 0.9% 250 mL IVPB  Status:  Discontinued       See Hyperspace for full Linked Orders Report.   200 mg 580 mL/hr over 30 Minutes Intravenous Once 04/19/21 0722 04/19/21 0844   04/19/21 1000  remdesivir 200 mg in sodium chloride 0.9% 250 mL IVPB       See Hyperspace for full Linked Orders Report.   200 mg 580 mL/hr over 30 Minutes Intravenous Once 04/19/21 0843 04/19/21 1452   04/19/21 0730  cefTRIAXone (ROCEPHIN) 1 g in sodium chloride 0.9 % 100 mL IVPB        1 g 200  mL/hr over 30 Minutes Intravenous  Once 04/19/21 0719 04/19/21 1008   04/19/21 0730  azithromycin (ZITHROMAX) 500 mg in sodium chloride 0.9 % 250 mL IVPB        500 mg 250 mL/hr over 60 Minutes Intravenous  Once 04/19/21 0719 04/19/21 1008              Family Communication/Anticipated D/C date and plan/Code Status   DVT prophylaxis: enoxaparin (LOVENOX) injection 40 mg Start: 04/20/21 2200 SCDs Start: 04/19/21 0842     Code Status: Full Code  Family Communication: None Disposition Plan:    Status is: Inpatient  Remains inpatient appropriate because:IV treatments appropriate due to intensity of illness or inability to take PO  Dispo: The patient is from: Home              Anticipated d/c is to: Home              Patient currently is not medically stable to d/c.   Difficult to place patient Yes           Subjective:   Interval events noted.  No chest pain or shortness of breath no fever this morning.  Objective:    Vitals:   04/20/21 0019 04/20/21 0502 04/20/21 0804 04/20/21 1230  BP: 127/84 121/80 125/77 122/81  Pulse: 74 73 73 80  Resp: 20 18 16 17   Temp: 98.1 F (36.7 C) 98.7 F (37.1 C) 98.7 F (37.1 C) 98.7 F (37.1 C)  TempSrc:  Oral    SpO2: 98% 100% 100% 99%  Weight:      Height:       No data found.   Intake/Output Summary (Last 24 hours) at 04/20/2021 1519 Last data filed at 04/20/2021 1507 Gross per 24 hour  Intake 450 ml  Output 250 ml  Net 200 ml   Filed Weights   04/19/21 0547  Weight: 54.4 kg    Exam:  GEN: NAD SKIN: No rash EYES: EOMI ENT: MMM CV: RRR PULM: CTA B ABD: soft, ND, NT, +BS CNS: AAO x 3, non focal EXT: No edema or tenderness         Data Reviewed:   I have personally reviewed following labs and imaging studies:  Labs: Labs show the following:   Basic Metabolic Panel: Recent Labs  Lab 04/19/21 0604 04/20/21 0637  NA 134* 137  K 3.3* 3.8  CL 102 108  CO2 25 25  GLUCOSE 98 82  BUN  8 9  CREATININE 0.73 0.66  CALCIUM 7.8* 8.3*  MG  --  1.7  PHOS  --  2.6   GFR Estimated Creatinine Clearance: 71.8 mL/min (by C-G formula based on SCr of 0.66 mg/dL). Liver Function Tests: Recent Labs  Lab 04/19/21 0604 04/20/21 0637  AST 28 21  ALT 18 16  ALKPHOS 67 59  BILITOT 1.7* 1.0  PROT 6.9 6.5  ALBUMIN 3.3* 2.9*   No results for input(s): LIPASE, AMYLASE in the last 168 hours. No results for input(s): AMMONIA in the last 168 hours. Coagulation profile Recent Labs  Lab 04/19/21 0604  INR 1.2    CBC: Recent Labs  Lab 04/19/21 0604 04/20/21 0637  WBC 5.3 4.2  NEUTROABS 3.7 2.8  HGB 12.8* 12.4*  HCT 35.2* 34.6*  MCV 93.1 93.8  PLT 103* 119*   Cardiac Enzymes: No results for input(s): CKTOTAL, CKMB, CKMBINDEX, TROPONINI in the last 168 hours. BNP (last 3 results) No results for input(s): PROBNP in the last 8760 hours. CBG: No results for input(s): GLUCAP in the last 168 hours. D-Dimer: Recent Labs    04/20/21 0637  DDIMER 0.53*   Hgb A1c: No results for input(s): HGBA1C in the last 72 hours. Lipid Profile: No results for input(s): CHOL, HDL, LDLCALC, TRIG, CHOLHDL, LDLDIRECT in the last 72 hours. Thyroid function studies: No results for input(s): TSH, T4TOTAL, T3FREE, THYROIDAB in the last 72 hours.  Invalid input(s): FREET3 Anemia work up: Recent Labs    04/20/21 [place] 574*   Sepsis Labs: Recent Labs  Lab 04/19/21 0604 04/19/21 0856 04/20/21 0637  PROCALCITON  --  0.18 0.23  WBC 5.3  --  4.2  LATICACIDVEN 1.4 1.2  --     Microbiology Recent Results (from the past 240 hour(s))  Resp Panel by RT-PCR (Flu A&B, Covid) Nasopharyngeal Swab     Status: Abnormal   Collection Time: 04/19/21  6:04 AM   Specimen: Nasopharyngeal Swab; Nasopharyngeal(NP) swabs in vial transport medium  Result Value Ref Range Status   SARS Coronavirus 2 by RT PCR POSITIVE (A) NEGATIVE Final    Comment: RESULT CALLED TO, READ BACK BY AND VERIFIED  WITH: DELSHAW GIVENS ON 04/19/21 AT 0706 QSD (NOTE) SARS-CoV-2 target nucleic acids are DETECTED.  The SARS-CoV-2 RNA is generally detectable in upper respiratory specimens during the acute phase of infection. Positive results are indicative of the presence of the identified virus, but do not rule out bacterial infection or co-infection with other pathogens not detected by the test. Clinical correlation with patient history and other diagnostic information is necessary to determine patient infection status. The expected result is Negative.  Fact Sheet for Patients: EntrepreneurPulse.com.au  Fact Sheet for Healthcare Providers: IncredibleEmployment.be  This test is not yet approved or cleared by the [place] FDA and  has been authorized for detection and/or diagnosis of SARS-CoV-2 by FDA under an Emergency Use Authorization (EUA).  This EUA will remain in effect (meaning this test ca n be used) for the duration of  the COVID-19 declaration under Section 564(b)(1) of the Act, 21 U.S.C. section 360bbb-3(b)(1), unless the authorization is terminated or revoked sooner.     Influenza A by PCR NEGATIVE NEGATIVE Final   Influenza B by PCR NEGATIVE NEGATIVE Final    Comment: (NOTE) The Xpert Xpress SARS-CoV-2/FLU/RSV plus assay is intended as an aid in the diagnosis of influenza from Nasopharyngeal swab specimens and  should not be used as a sole basis for treatment. Nasal washings and aspirates are unacceptable for Xpert Xpress SARS-CoV-2/FLU/RSV testing.  Fact Sheet for Patients: EntrepreneurPulse.com.au  Fact Sheet for Healthcare Providers: IncredibleEmployment.be  This test is not yet approved or cleared by the [place] FDA and has been authorized for detection and/or diagnosis of SARS-CoV-2 by FDA under an Emergency Use Authorization (EUA). This EUA will remain in effect (meaning this test can be  used) for the duration of the COVID-19 declaration under Section 564(b)(1) of the Act, 21 U.S.C. section 360bbb-3(b)(1), unless the authorization is terminated or revoked.  Performed at [facility], [address] [place], [place] [address]   Culture, blood (Routine x 2)     Status: None (Preliminary result)   Collection Time: 04/19/21  6:04 AM   Specimen: BLOOD  Result Value Ref Range Status   Specimen Description BLOOD LEFT ANTECUBITAL  Final   Special Requests   Final    BOTTLES DRAWN AEROBIC AND ANAEROBIC Blood Culture results may not be optimal due to an excessive volume of blood received in culture bottles   Culture   Final    NO GROWTH < 24 HOURS Performed at [facility], [address], [place], [place] [address]    Report Status PENDING  Incomplete  Culture, blood (Routine x 2)     Status: None (Preliminary result)   Collection Time: 04/19/21  6:04 AM   Specimen: BLOOD  Result Value Ref Range Status   Specimen Description   Final    BLOOD BLOOD RIGHT FOREARM Performed at [facility], [address] [place], [place] [address]    Special Requests   Final    BOTTLES DRAWN AEROBIC AND ANAEROBIC Blood Culture adequate volume Performed at [facility], [address] [place], [place] [address]    Culture  Setup Time   Final    Organism ID to follow [place] CRITICAL RESULT CALLED TO, READ BACK BY AND VERIFIED WITH: JASON ROBINS @0233  ON 06.12.22.LFD    Culture GRAM POSITIVE COCCI  Final   Report Status PENDING  Incomplete  Urine Culture     Status: None   Collection Time: 04/19/21  6:04 AM   Specimen: Urine, Random  Result Value Ref Range Status   Specimen Description   Final    URINE, RANDOM Performed at [facility], [address] [place], [place] [address]    Special Requests   Final    NONE Performed at [facility], [address], [place], [place]  [address]    Culture   Final    NO GROWTH Performed at [facility], [place] [address] [place], [place] [address]    Report Status 04/20/2021 FINAL  Final  Blood Culture ID Panel (Reflexed)     Status: Abnormal   Collection Time: 04/19/21  6:04 AM  Result Value Ref Range Status   Enterococcus faecalis NOT DETECTED NOT DETECTED Final   Enterococcus Faecium NOT DETECTED NOT DETECTED Final   Listeria monocytogenes NOT DETECTED NOT DETECTED Final   Staphylococcus species DETECTED (A) NOT DETECTED Final    Comment: CRITICAL RESULT CALLED TO, READ BACK BY AND VERIFIED WITH: JASON ROBINS @0233  04/21/2021 LFD    Staphylococcus aureus (BCID) NOT DETECTED NOT DETECTED Final   Staphylococcus epidermidis NOT DETECTED NOT DETECTED Final   Staphylococcus lugdunensis DETECTED (A) NOT DETECTED Final    Comment: CRITICAL RESULT CALLED TO, READ BACK  BY AND VERIFIED WITH: JASON ROBINS @0233  04/20/2021 LFD    Streptococcus species NOT DETECTED NOT DETECTED Final   Streptococcus agalactiae NOT DETECTED NOT DETECTED Final   Streptococcus pneumoniae NOT DETECTED NOT DETECTED Final   Streptococcus pyogenes NOT DETECTED NOT DETECTED Final   A.calcoaceticus-baumannii NOT DETECTED NOT DETECTED Final   Bacteroides fragilis NOT DETECTED NOT DETECTED Final   Enterobacterales NOT DETECTED NOT DETECTED Final   Enterobacter cloacae complex NOT DETECTED NOT DETECTED Final   Escherichia coli NOT DETECTED NOT DETECTED Final   Klebsiella aerogenes NOT DETECTED NOT DETECTED Final   Klebsiella oxytoca NOT DETECTED NOT DETECTED Final   Klebsiella pneumoniae NOT DETECTED NOT DETECTED Final   Proteus species NOT DETECTED NOT DETECTED Final   Salmonella species NOT DETECTED NOT DETECTED Final   Serratia marcescens NOT DETECTED NOT DETECTED Final   Haemophilus influenzae NOT DETECTED NOT DETECTED Final   Neisseria meningitidis NOT DETECTED NOT DETECTED Final   Pseudomonas aeruginosa NOT DETECTED NOT DETECTED Final    Stenotrophomonas maltophilia NOT DETECTED NOT DETECTED Final   Candida albicans NOT DETECTED NOT DETECTED Final   Candida auris NOT DETECTED NOT DETECTED Final   Candida glabrata NOT DETECTED NOT DETECTED Final   Candida krusei NOT DETECTED NOT DETECTED Final   Candida parapsilosis NOT DETECTED NOT DETECTED Final   Candida tropicalis NOT DETECTED NOT DETECTED Final   Cryptococcus neoformans/gattii NOT DETECTED NOT DETECTED Final   Methicillin resistance mecA/C NOT DETECTED NOT DETECTED Final    Comment: Performed at [facility], [address] [place], [place] [address]    Procedures and diagnostic studies:  DG Chest 2 View  Result Date: 04/19/2021 CLINICAL DATA:  Suspected sepsis.  Fever EXAM: CHEST - 2 VIEW COMPARISON:  10/28/2018 FINDINGS: Right middle lobe pneumonia. No edema, effusion, or pneumothorax. Normal heart size and mediastinal contours. Artifact from EKG leads. IMPRESSION: Right middle lobe pneumonia. Electronically Signed   By: Monte Fantasia M.D.   On: 04/19/2021 06:39               LOS: 1 day   Amna Welker  [facility]   Pager on [email or web address]. If 7PM-7AM, please contact night-coverage at [email or web address]     04/20/2021, 3:19 PM

## 2021-04-20 NOTE — Progress Notes (Signed)
PHARMACIST - PHYSICIAN COMMUNICATION  CONCERNING: Antibiotic IV to Oral Route Change Policy  RECOMMENDATION: This patient is receiving azithromycin by the intravenous route.  Based on criteria approved by the Pharmacy and Therapeutics Committee, the antibiotic(s) is/are being converted to the equivalent oral dose form(s).   DESCRIPTION: These criteria include: Patient being treated for a respiratory tract infection, urinary tract infection, cellulitis or clostridium difficile associated diarrhea if on metronidazole The patient is not neutropenic and does not exhibit a GI malabsorption state The patient is eating (either orally or via tube) and/or has been taking other orally administered medications for a least 24 hours The patient is improving clinically and has a Tmax < 100.5  If you have questions about this conversion, please contact the [place]  04/20/21

## 2021-04-21 ENCOUNTER — Inpatient Hospital Stay
Admit: 2021-04-21 | Discharge: 2021-04-21 | Disposition: A | Discharge status: Home / Self Care | Payer: Self-pay | Attending: Internal Medicine | Admitting: Internal Medicine

## 2021-04-21 DIAGNOSIS — R7881 Bacteremia: Secondary | ICD-10-CM

## 2021-04-21 DIAGNOSIS — U071 COVID-19: Principal | ICD-10-CM

## 2021-04-21 DIAGNOSIS — K7460 Unspecified cirrhosis of liver: Secondary | ICD-10-CM

## 2021-04-21 DIAGNOSIS — B957 Other staphylococcus as the cause of diseases classified elsewhere: Secondary | ICD-10-CM | POA: Diagnosis present

## 2021-04-21 HISTORY — DX: Bacteremia: R78.81

## 2021-04-21 LAB — COMPREHENSIVE METABOLIC PANEL
ALT: 17 U/L (ref 0–44)
AST: 23 U/L (ref 15–41)
Albumin: 3 g/dL — ABNORMAL LOW (ref 3.5–5.0)
Alkaline Phosphatase: 59 U/L (ref 38–126)
Anion gap: 5 (ref 5–15)
BUN: 10 mg/dL (ref 8–23)
CO2: 24 mmol/L (ref 22–32)
Calcium: 8.4 mg/dL — ABNORMAL LOW (ref 8.9–10.3)
Chloride: 109 mmol/L (ref 98–111)
Creatinine, Ser: 0.76 mg/dL (ref 0.61–1.24)
GFR, Estimated: >60 mL/min (ref >60)
Glucose, Bld: 93 mg/dL (ref 70–99)
Potassium: 3.7 mmol/L (ref 3.5–5.1)
Sodium: 138 mmol/L (ref 135–145)
Total Bilirubin: 0.8 mg/dL (ref 0.3–1.2)
Total Protein: 6.7 g/dL (ref 6.5–8.1)

## 2021-04-21 LAB — CBC WITH DIFFERENTIAL/PLATELET
Abs Immature Granulocytes: 0.01 10*3/uL (ref 0.00–0.07)
Basophils Absolute: 0 10*3/uL (ref 0.0–0.1)
Basophils Relative: 0 %
Eosinophils Absolute: 0 10*3/uL (ref 0.0–0.5)
Eosinophils Relative: 1 %
HCT: 34 % — ABNORMAL LOW (ref 39.0–52.0)
Hemoglobin: 12.2 g/dL — ABNORMAL LOW (ref 13.0–17.0)
Immature Granulocytes: 0 %
Lymphocytes Relative: 37 %
Lymphs Abs: 1.3 10*3/uL (ref 0.7–4.0)
MCH: 32.8 pg (ref 26.0–34.0)
MCHC: 35.9 g/dL (ref 30.0–36.0)
MCV: 91.4 fL (ref 80.0–100.0)
Monocytes Absolute: 0.3 10*3/uL (ref 0.1–1.0)
Monocytes Relative: 8 %
Neutro Abs: 1.9 10*3/uL (ref 1.7–7.7)
Neutrophils Relative %: 54 %
Platelets: 136 10*3/uL — ABNORMAL LOW (ref 150–400)
RBC: 3.72 MIL/uL — ABNORMAL LOW (ref 4.22–5.81)
RDW: 12.2 % (ref 11.5–15.5)
WBC: 3.5 10*3/uL — ABNORMAL LOW (ref 4.0–10.5)
nRBC: 0 % (ref 0.0–0.2)

## 2021-04-21 LAB — ECHOCARDIOGRAM COMPLETE
Height: 68 in
S' Lateral: 3.03 cm
Weight: 1920 oz

## 2021-04-21 LAB — D-DIMER, QUANTITATIVE: D-Dimer, Quant: 0.49 ug/mL-FEU (ref 0.00–0.50)

## 2021-04-21 LAB — MAGNESIUM: Magnesium: 1.6 mg/dL — ABNORMAL LOW (ref 1.7–2.4)

## 2021-04-21 LAB — PHOSPHORUS: Phosphorus: 3.3 mg/dL (ref 2.5–4.6)

## 2021-04-21 LAB — PROCALCITONIN: Procalcitonin: 0.14 ng/mL

## 2021-04-21 LAB — FERRITIN: Ferritin: 475 ng/mL — ABNORMAL HIGH (ref 24–336)

## 2021-04-21 LAB — C-REACTIVE PROTEIN: CRP: 3.2 mg/dL — ABNORMAL HIGH (ref <1.0)

## 2021-04-21 MED ORDER — ENSURE ENLIVE PO LIQD
237.0000 mL | Freq: Three times a day (TID) | ORAL | Status: DC
  Administered 2021-04-21 – 2021-04-23 (×7): 237 mL via ORAL

## 2021-04-21 MED ORDER — AZITHROMYCIN 500 MG PO TABS
500.0000 mg | ORAL_TABLET | Freq: Every day | ORAL | Status: AC
  Administered 2021-04-22 – 2021-04-23 (×2): 500 mg via ORAL
  Filled 2021-04-21 (×2): qty 1

## 2021-04-21 MED ORDER — POTASSIUM CHLORIDE CRYS ER 20 MEQ PO TBCR
40.0000 meq | EXTENDED_RELEASE_TABLET | Freq: Once | ORAL | Status: AC
  Administered 2021-04-21: 40 meq via ORAL
  Filled 2021-04-21: qty 2

## 2021-04-21 MED ORDER — MAGNESIUM SULFATE 2 GM/50ML IV SOLN
2.0000 g | Freq: Once | INTRAVENOUS | Status: AC
  Administered 2021-04-21: 2 g via INTRAVENOUS
  Filled 2021-04-21: qty 50

## 2021-04-21 MED ORDER — NICOTINE 21 MG/24HR TD PT24
21.0000 mg | MEDICATED_PATCH | Freq: Every day | TRANSDERMAL | Status: DC
  Administered 2021-04-21 – 2021-04-23 (×3): 21 mg via TRANSDERMAL
  Filled 2021-04-21 (×3): qty 1

## 2021-04-21 NOTE — Progress Notes (Signed)
*  PRELIMINARY RESULTS* Echocardiogram 2D Echocardiogram has been performed.  Mark Carr 04/21/2021, 1:42 PM

## 2021-04-21 NOTE — Consult Note (Signed)
NAME: Mark Carr  DOB: 10/30/1956  MRN: [id number]  Date/Time: 04/21/2021 8:14 AM  REQUESTING PROVIDER: Dr. Mal Misty Subjective:  REASON FOR CONSULT: Staph lugdunensis bacteremia ? Mark Carr is a 65 y.o. male with a history of liver cirrhosis,etoh and drug abuse,was brought in by EMS on 6/11 c/o fever and wekaness for a few days. Pt was recently in [facility] between 5/24-5/26/22 with diarrhea and weakness and was treated for hyponatremia, salmonella gastroenteritis which was treated with Iv ceftriaxone while in the hospital and discharged on PO cipro for a total of 7 days  Pt reports one episode of dizziness leading to fall, cough and some sputum. In the ED temp 100.3 BP 125/78HR 83, RR 16 Sars cov 2 was positive CXR showed rt middle lobe pneumonia  Blood culuture sent and he was started on IV ceftriaxone and p.o. azithromycin. I am seeing the patient because 1 bottle of the blood culture is growing staph lugdunensis Patient denies IV drug use.  He does snort cocaine Patient denies any new sores or wounds to his body States he is feeling better now   Past Medical History:  Diagnosis Date   Cirrhosis of liver (HCC)    Wrist fracture     Past Surgical History:  Procedure Laterality Date   WRIST FRACTURE SURGERY Right 2016    Social History   Socioeconomic History   Marital status: Single    Spouse name: Not on file   Number of children: 4   Years of education: 19   Highest education level: 11th grade  Occupational History   Not on file  Tobacco Use   Smoking status: Every Day    Packs/day: 0.50    Years: 40.00    Pack years: 20.00    Types: Cigarettes   Smokeless tobacco: Never  Vaping Use   Vaping Use: Never used  Substance and Sexual Activity   Alcohol use: Yes    Alcohol/week: 4.0 standard drinks    Types: 4 Cans of beer per week    Comment: 4 beers a day   Drug use: Not Currently   Sexual activity: Not Currently  Other Topics Concern   Not on file  Social  History Narrative   Not on file   Social Determinants of Health   Financial Resource Strain: Not on file  Food Insecurity: Not on file  Transportation Needs: Not on file  Physical Activity: Not on file  Stress: Not on file  Social Connections: Not on file  Intimate Partner Violence: Not on file    Family History  Problem Relation Age of Onset   Hypertension Mother    Deep vein thrombosis Mother    Cancer Sister        unknown type   Heart disease Brother    Cancer Maternal Grandmother    No Known Allergies I? Current Facility-Administered Medications  Medication Dose Route Frequency Provider Last Rate Last Admin   0.9 %  sodium chloride infusion  250 mL Intravenous PRN Agbata, Tochukwu, MD       acetaminophen (TYLENOL) tablet 650 mg  650 mg Oral Q6H PRN Agbata, Tochukwu, MD       albuterol (VENTOLIN HFA) 108 (90 Base) MCG/ACT inhaler 2 puff  2 puff Inhalation Q6H Agbata, Tochukwu, MD   2 puff at 04/20/21 2150   ascorbic acid (VITAMIN C) tablet 500 mg  500 mg Oral Daily Agbata, Tochukwu, MD   500 mg at 04/20/21 1037   azithromycin (ZITHROMAX) tablet 500  mg  500 mg Oral Daily Benita Gutter, RPH       ceFAZolin (ANCEF) IVPB 2g/100 mL premix  2 g Intravenous Q8H Jennye Boroughs, MD 200 mL/hr at 04/21/21 0630 2 g at 04/21/21 0630   enoxaparin (LOVENOX) injection 40 mg  40 mg Subcutaneous Q24H Jennye Boroughs, MD   40 mg at 23/55/73 2202   folic acid (FOLVITE) tablet 1 mg  1 mg Oral Daily Agbata, Tochukwu, MD   1 mg at 04/20/21 1037   guaiFENesin-dextromethorphan (ROBITUSSIN DM) 100-10 MG/5ML syrup 10 mL  10 mL Oral Q4H PRN Agbata, Tochukwu, MD       multivitamin with minerals tablet 1 tablet  1 tablet Oral Daily Agbata, Tochukwu, MD   1 tablet at 04/20/21 1037   ondansetron (ZOFRAN) tablet 4 mg  4 mg Oral Q6H PRN Agbata, Tochukwu, MD       Or   ondansetron (ZOFRAN) injection 4 mg  4 mg Intravenous Q6H PRN Agbata, Tochukwu, MD       remdesivir 100 mg in sodium chloride 0.9 % 100 mL  IVPB  100 mg Intravenous Daily Agbata, Tochukwu, MD   Stopped at 04/20/21 1142   sodium chloride flush (NS) 0.9 % injection 3 mL  3 mL Intravenous Q12H Agbata, Tochukwu, MD   3 mL at 04/20/21 2151   sodium chloride flush (NS) 0.9 % injection 3 mL  3 mL Intravenous PRN Agbata, Tochukwu, MD       thiamine tablet 100 mg  100 mg Oral Daily Agbata, Tochukwu, MD   100 mg at 04/20/21 1037   zinc sulfate capsule 220 mg  220 mg Oral Daily Agbata, Tochukwu, MD   220 mg at 04/20/21 1037     Abtx:  Anti-infectives (From admission, onward)    Start     Dose/Rate Route Frequency Ordered Stop   04/21/21 1000  azithromycin (ZITHROMAX) tablet 500 mg        500 mg Oral Daily 04/20/21 1238 04/24/21 0959   04/20/21 2200  ceFAZolin (ANCEF) IVPB 2g/100 mL premix        2 g 200 mL/hr over 30 Minutes Intravenous Every 8 hours 04/20/21 1131     04/20/21 1000  remdesivir 100 mg in sodium chloride 0.9 % 100 mL IVPB  Status:  Discontinued       See Hyperspace for full Linked Orders Report.   100 mg 200 mL/hr over 30 Minutes Intravenous Daily 04/19/21 0722 04/19/21 0844   04/20/21 1000  remdesivir 100 mg in sodium chloride 0.9 % 100 mL IVPB       See Hyperspace for full Linked Orders Report.   100 mg 200 mL/hr over 30 Minutes Intravenous Daily 04/19/21 0843 04/24/21 0959   04/20/21 1000  cefTRIAXone (ROCEPHIN) 2 g in sodium chloride 0.9 % 100 mL IVPB  Status:  Discontinued        2 g 200 mL/hr over 30 Minutes Intravenous Every 24 hours 04/19/21 0844 04/20/21 1131   04/20/21 1000  azithromycin (ZITHROMAX) 500 mg in sodium chloride 0.9 % 250 mL IVPB  Status:  Discontinued        500 mg 250 mL/hr over 60 Minutes Intravenous Every 24 hours 04/19/21 0844 04/20/21 1238   04/19/21 1000  remdesivir 200 mg in sodium chloride 0.9% 250 mL IVPB  Status:  Discontinued       See Hyperspace for full Linked Orders Report.   200 mg 580 mL/hr over 30 Minutes Intravenous Once 04/19/21 0722 04/19/21 0844  04/19/21 1000   remdesivir 200 mg in sodium chloride 0.9% 250 mL IVPB       See Hyperspace for full Linked Orders Report.   200 mg 580 mL/hr over 30 Minutes Intravenous Once 04/19/21 0843 04/19/21 1452   04/19/21 0730  cefTRIAXone (ROCEPHIN) 1 g in sodium chloride 0.9 % 100 mL IVPB        1 g 200 mL/hr over 30 Minutes Intravenous  Once 04/19/21 0719 04/19/21 1008   04/19/21 0730  azithromycin (ZITHROMAX) 500 mg in sodium chloride 0.9 % 250 mL IVPB        500 mg 250 mL/hr over 60 Minutes Intravenous  Once 04/19/21 0719 04/19/21 1008       REVIEW OF SYSTEMS:  Const:  fever, negative chills, negative weight loss Eyes: negative diplopia or visual changes, negative eye pain ENT: negative coryza, negative sore throat Resp:  cough, no hemoptysis, dyspnea Cards: negative for chest pain, palpitations, lower extremity edema GU: negative for frequency, dysuria and hematuria GI: Negative for abdominal pain, diarrhea, bleeding, constipation Skin: negative for rash and pruritus Heme: negative for easy bruising and gum/nose bleeding MS: generalized weakness Neurolo:dizziness , fall Psych: negative for feelings of anxiety, depression  Endocrine: negative for thyroid, diabetes Allergy/Immunology- negative for any medication or food allergies ? Objective:  VITALS:  BP 124/81 (BP Location: Left Arm)   Pulse 66   Temp 98 F (36.7 C)   Resp 20   Ht 5\' 8"  (1.727 m)   Wt 54.4 kg   SpO2 100%   BMI 18.25 kg/m  PHYSICAL EXAM:  General: Alert, cooperative, no distress, appears stated age.  Head: Normocephalic, without obvious abnormality, atraumatic. Eyes: Conjunctivae clear, anicteric sclerae. Pupils are equal ENT Nares normal. No drainage or sinus tenderness. Lips, mucosa, and tongue normal. No Thrush Neck: Supple, symmetrical, no adenopathy, thyroid: non tender no carotid bruit and no JVD. Back: No CVA tenderness. Lungs: Clear to auscultation bilaterally. No Wheezing or Rhonchi. No rales. Heart: Regular  rate and rhythm, no murmur, rub or gallop. Abdomen: Soft, non-tender,not distended. Bowel sounds normal. No masses Extremities: atraumatic, no cyanosis. No edema. No clubbing Skin: hyperpigmented scars scattered throughout  Lymph: Cervical, supraclavicular normal. Neurologic: Grossly non-focal Pertinent Labs Lab Results CBC    Component Value Date/Time   WBC 3.5 (L) 04/21/2021 0557   RBC 3.72 (L) 04/21/2021 0557   HGB 12.2 (L) 04/21/2021 0557   HGB 13.2 05/07/2012 0855   HCT 34.0 (L) 04/21/2021 0557   HCT 38.1 (L) 05/07/2012 0855   PLT 136 (L) 04/21/2021 0557   PLT 100 (L) 05/07/2012 0855   MCV 91.4 04/21/2021 0557   MCV 103 (H) 05/07/2012 0855   MCH 32.8 04/21/2021 0557   MCHC 35.9 04/21/2021 0557   RDW 12.2 04/21/2021 0557   RDW 13.4 05/07/2012 0855   LYMPHSABS 1.3 04/21/2021 0557   MONOABS 0.3 04/21/2021 0557   EOSABS 0.0 04/21/2021 0557   BASOSABS 0.0 04/21/2021 0557    CMP Latest Ref Rng & Units 04/21/2021 04/20/2021 04/19/2021  Glucose 70 - 99 mg/dL 93 82 98  BUN 8 - 23 mg/dL 10 9 8   Creatinine 0.61 - 1.24 mg/dL 0.76 0.66 0.73  Sodium 135 - 145 mmol/L 138 137 134(L)  Potassium 3.5 - 5.1 mmol/L 3.7 3.8 3.3(L)  Chloride 98 - 111 mmol/L 109 108 102  CO2 22 - 32 mmol/L 24 25 25   Calcium 8.9 - 10.3 mg/dL 8.4(L) 8.3(L) 7.8(L)  Total Protein 6.5 - 8.1 g/dL 6.7  6.5 6.9  Total Bilirubin 0.3 - 1.2 mg/dL 0.8 1.0 1.7(H)  Alkaline Phos 38 - 126 U/L 59 59 67  AST 15 - 41 U/L 23 21 28   ALT 0 - 44 U/L 17 16 18       Microbiology: Recent Results (from the past 240 hour(s))  Resp Panel by RT-PCR (Flu A&B, Covid) Nasopharyngeal Swab     Status: Abnormal   Collection Time: 04/19/21  6:04 AM   Specimen: Nasopharyngeal Swab; Nasopharyngeal(NP) swabs in vial transport medium  Result Value Ref Range Status   SARS Coronavirus 2 by RT PCR POSITIVE (A) NEGATIVE Final    Comment: RESULT CALLED TO, READ BACK BY AND VERIFIED WITH: DELSHAW GIVENS ON 04/19/21 AT 0706  QSD (NOTE) SARS-CoV-2 target nucleic acids are DETECTED.  The SARS-CoV-2 RNA is generally detectable in upper respiratory specimens during the acute phase of infection. Positive results are indicative of the presence of the identified virus, but do not rule out bacterial infection or co-infection with other pathogens not detected by the test. Clinical correlation with patient history and other diagnostic information is necessary to determine patient infection status. The expected result is Negative.  Fact Sheet for Patients: EntrepreneurPulse.com.au  Fact Sheet for Healthcare Providers: IncredibleEmployment.be  This test is not yet approved or cleared by the [place] FDA and  has been authorized for detection and/or diagnosis of SARS-CoV-2 by FDA under an Emergency Use Authorization (EUA).  This EUA will remain in effect (meaning this test ca n be used) for the duration of  the COVID-19 declaration under Section 564(b)(1) of the Act, 21 U.S.C. section 360bbb-3(b)(1), unless the authorization is terminated or revoked sooner.     Influenza A by PCR NEGATIVE NEGATIVE Final   Influenza B by PCR NEGATIVE NEGATIVE Final    Comment: (NOTE) The Xpert Xpress SARS-CoV-2/FLU/RSV plus assay is intended as an aid in the diagnosis of influenza from Nasopharyngeal swab specimens and should not be used as a sole basis for treatment. Nasal washings and aspirates are unacceptable for Xpert Xpress SARS-CoV-2/FLU/RSV testing.  Fact Sheet for Patients: EntrepreneurPulse.com.au  Fact Sheet for Healthcare Providers: IncredibleEmployment.be  This test is not yet approved or cleared by the [place] FDA and has been authorized for detection and/or diagnosis of SARS-CoV-2 by FDA under an Emergency Use Authorization (EUA). This EUA will remain in effect (meaning this test can be used) for the duration of the COVID-19  declaration under Section 564(b)(1) of the Act, 21 U.S.C. section 360bbb-3(b)(1), unless the authorization is terminated or revoked.  Performed at [facility], [address] [place], [place] [address]   Culture, blood (Routine x 2)     Status: None (Preliminary result)   Collection Time: 04/19/21  6:04 AM   Specimen: BLOOD  Result Value Ref Range Status   Specimen Description BLOOD LEFT ANTECUBITAL  Final   Special Requests   Final    BOTTLES DRAWN AEROBIC AND ANAEROBIC Blood Culture results may not be optimal due to an excessive volume of blood received in culture bottles   Culture   Final    NO GROWTH 2 DAYS Performed at [facility], [address] [place], [place] [address]    Report Status PENDING  Incomplete  Culture, blood (Routine x 2)     Status: Abnormal (Preliminary result)   Collection Time: 04/19/21  6:04 AM   Specimen: BLOOD  Result Value Ref Range Status   Specimen Description   Final    BLOOD BLOOD  RIGHT FOREARM Performed at [facility], [address] [place], [place] [address]    Special Requests   Final    BOTTLES DRAWN AEROBIC AND ANAEROBIC Blood Culture adequate volume Performed at [facility], [address] [place], [place] [address]    Culture  Setup Time   Final    GRAM POSITIVE COCCI ANAEROBIC BOTTLE ONLY CRITICAL RESULT CALLED TO, READ BACK BY AND VERIFIED WITH: JASON ROBINS @0233  ON 06.12.22.LFD    Culture (A)  Final    STAPHYLOCOCCUS LUGDUNENSIS SUSCEPTIBILITIES TO FOLLOW Performed at [facility], [place] [address], [place], [place] [address]    Report Status PENDING  Incomplete  Urine Culture     Status: None   Collection Time: 04/19/21  6:04 AM   Specimen: Urine, Random  Result Value Ref Range Status   Specimen Description   Final    URINE, RANDOM Performed at [facility], [address], [place], [place] [address]    Special Requests   Final    NONE Performed at [facility], [address] [place], [place] [address]    Culture   Final    NO GROWTH Performed at [facility], [place] [address], [place],  [address]    Report Status 04/20/2021 FINAL  Final  Blood Culture ID Panel (Reflexed)     Status: Abnormal   Collection Time: 04/19/21  6:04 AM  Result Value Ref Range Status   Enterococcus faecalis NOT DETECTED NOT DETECTED Final   Enterococcus Faecium NOT DETECTED NOT DETECTED Final   Listeria monocytogenes NOT DETECTED NOT DETECTED Final   Staphylococcus species DETECTED (A) NOT DETECTED Final    Comment: CRITICAL RESULT CALLED TO, READ BACK BY AND VERIFIED WITH: JASON ROBINS @0233  04/21/2021 LFD    Staphylococcus aureus (BCID) NOT DETECTED NOT DETECTED Final   Staphylococcus epidermidis NOT DETECTED NOT DETECTED Final   Staphylococcus lugdunensis DETECTED (A) NOT DETECTED Final    Comment: CRITICAL RESULT CALLED TO, READ BACK BY AND VERIFIED WITH: JASON ROBINS @0233  04/20/2021 LFD    Streptococcus species NOT DETECTED NOT DETECTED Final   Streptococcus agalactiae NOT DETECTED NOT DETECTED Final   Streptococcus pneumoniae NOT DETECTED NOT DETECTED Final   Streptococcus pyogenes NOT DETECTED NOT DETECTED Final   A.calcoaceticus-baumannii NOT DETECTED NOT DETECTED Final   Bacteroides fragilis NOT DETECTED NOT DETECTED Final   Enterobacterales NOT DETECTED NOT DETECTED Final   Enterobacter cloacae complex NOT DETECTED NOT DETECTED Final   Escherichia coli NOT DETECTED NOT DETECTED Final   Klebsiella aerogenes NOT DETECTED NOT DETECTED Final   Klebsiella oxytoca NOT DETECTED NOT DETECTED Final   Klebsiella pneumoniae NOT DETECTED NOT DETECTED Final   Proteus species NOT DETECTED NOT DETECTED Final   Salmonella species NOT DETECTED NOT DETECTED Final   Serratia marcescens NOT DETECTED NOT DETECTED Final   Haemophilus influenzae NOT DETECTED NOT DETECTED Final   Neisseria meningitidis NOT DETECTED NOT DETECTED Final    Pseudomonas aeruginosa NOT DETECTED NOT DETECTED Final   Stenotrophomonas maltophilia NOT DETECTED NOT DETECTED Final   Candida albicans NOT DETECTED NOT DETECTED Final   Candida auris NOT DETECTED NOT DETECTED Final   Candida glabrata NOT DETECTED NOT DETECTED Final   Candida krusei NOT DETECTED NOT DETECTED Final   Candida parapsilosis NOT DETECTED NOT DETECTED Final   Candida tropicalis NOT DETECTED NOT DETECTED Final   Cryptococcus neoformans/gattii NOT DETECTED NOT DETECTED Final   Methicillin resistance mecA/C NOT DETECTED NOT DETECTED  Final    Comment: Performed at [facility], [address] [place], [place] [address]    IMAGING RESULTS:  I have personally reviewed the films ? Impression/Recommendation ? ?COVID 19 viral illness- on remdisivir  Staph lugdnensis 1 of 4- could be a contamiannt but will treat like a pathogen  Continue cefazolin- repeat blood culture and 2 d echo  Cirrhosis liver  Recent salmonella gastroenteritis- resolved ? ___________________________________________________ Discussed with patient,and care team Note:  This document was prepared using Dragon voice recognition software and may include unintentional dictation errors.

## 2021-04-21 NOTE — Progress Notes (Addendum)
Progress Note    Mark Carr  NAT:[id number] DOB: [birth year]  DOA: 04/19/2021 PCP: Virginia Crews, MD      Brief Narrative:    Medical records reviewed and are as summarized below:  Mark Carr is a 65 y.o. male with medical history significant for nicotine dependence, liver cirrhosis, history of alcohol use disorder, who was recently discharged from the hospital after treatment for Salmonella gastroenteritis, AKI and and a fall.  He presented to the hospital because of fever (T-max 101 F) generalized weakness and dizziness.  He tested positive COVID-19 infection.      Assessment/Plan:   Principal Problem:   Pneumonia due to COVID-19 virus Active Problems:   Tobacco use disorder   Cirrhosis of liver (HCC)   UTI (urinary tract infection)   Hypokalemia    Body mass index is 18.25 kg/m.   Fever, COVID-19 pneumonia, suspected superimposed bacterial infection, Staph lugdunensis bacteremia: 1 out of 4 bottles showed staph lugdunensis.  Unclear whether this is a contaminant or not.  Continue IV cefazolin, IV remdesivir and steroids.  Discontinue azithromycin.  Order 2D echo to look for endocarditis.  Repeat blood culture.  Consulted ID specialist to assist with management.  Abnormal urinalysis: Asymptomatic.  No growth on urine culture.   Hypomagnesemia: Replete magnesium and monitor levels.  Liver cirrhosis: Compensated  Chronic thrombocytopenia: This is likely from liver cirrhosis.  Tobacco use disorder: Counseled to quit smoking cigarettes.  History of alcohol use disorder: He said he quit drinking alcohol about a month prior to admission.   Diet Order             Diet regular Room service appropriate? Yes; Fluid consistency: Thin  Diet effective now                      Consultants: Infectious disease  Procedures: None    Medications:    albuterol  2 puff Inhalation Q6H   vitamin C  500 mg Oral Daily   enoxaparin (LOVENOX)  injection  40 mg Subcutaneous Q24H   feeding supplement  237 mL Oral TID BM   folic acid  1 mg Oral Daily   multivitamin with minerals  1 tablet Oral Daily   sodium chloride flush  3 mL Intravenous Q12H   thiamine  100 mg Oral Daily   zinc sulfate  220 mg Oral Daily   Continuous Infusions:  sodium chloride      ceFAZolin (ANCEF) IV 2 g (04/21/21 0630)   remdesivir 100 mg in NS 100 mL 100 mg (04/21/21 0933)     Anti-infectives (From admission, onward)    Start     Dose/Rate Route Frequency Ordered Stop   04/21/21 1000  azithromycin (ZITHROMAX) tablet 500 mg  Status:  Discontinued        500 mg Oral Daily 04/20/21 1238 04/21/21 1446   04/20/21 2200  ceFAZolin (ANCEF) IVPB 2g/100 mL premix        2 g 200 mL/hr over 30 Minutes Intravenous Every 8 hours 04/20/21 1131     04/20/21 1000  remdesivir 100 mg in sodium chloride 0.9 % 100 mL IVPB  Status:  Discontinued       See Hyperspace for full Linked Orders Report.   100 mg 200 mL/hr over 30 Minutes Intravenous Daily 04/19/21 0722 04/19/21 0844   04/20/21 1000  remdesivir 100 mg in sodium chloride 0.9 % 100 mL IVPB       See  Hyperspace for full Linked Orders Report.   100 mg 200 mL/hr over 30 Minutes Intravenous Daily 04/19/21 0843 04/24/21 0959   04/20/21 1000  cefTRIAXone (ROCEPHIN) 2 g in sodium chloride 0.9 % 100 mL IVPB  Status:  Discontinued        2 g 200 mL/hr over 30 Minutes Intravenous Every 24 hours 04/19/21 0844 04/20/21 1131   04/20/21 1000  azithromycin (ZITHROMAX) 500 mg in sodium chloride 0.9 % 250 mL IVPB  Status:  Discontinued        500 mg 250 mL/hr over 60 Minutes Intravenous Every 24 hours 04/19/21 0844 04/20/21 1238   04/19/21 1000  remdesivir 200 mg in sodium chloride 0.9% 250 mL IVPB  Status:  Discontinued       See Hyperspace for full Linked Orders Report.   200 mg 580 mL/hr over 30 Minutes Intravenous Once 04/19/21 0722 04/19/21 0844   04/19/21 1000  remdesivir 200 mg in sodium chloride 0.9% 250 mL IVPB        See Hyperspace for full Linked Orders Report.   200 mg 580 mL/hr over 30 Minutes Intravenous Once 04/19/21 0843 04/19/21 1452   04/19/21 0730  cefTRIAXone (ROCEPHIN) 1 g in sodium chloride 0.9 % 100 mL IVPB        1 g 200 mL/hr over 30 Minutes Intravenous  Once 04/19/21 0719 04/19/21 1008   04/19/21 0730  azithromycin (ZITHROMAX) 500 mg in sodium chloride 0.9 % 250 mL IVPB        500 mg 250 mL/hr over 60 Minutes Intravenous  Once 04/19/21 0719 04/19/21 1008              Family Communication/Anticipated D/C date and plan/Code Status   DVT prophylaxis: enoxaparin (LOVENOX) injection 40 mg Start: 04/20/21 2200 SCDs Start: 04/19/21 0842     Code Status: Full Code  Family Communication: None Disposition Plan:    Status is: Inpatient  Remains inpatient appropriate because:IV treatments appropriate due to intensity of illness or inability to take PO  Dispo: The patient is from: Home              Anticipated d/c is to: Home              Patient currently is not medically stable to d/c.   Difficult to place patient Yes           Subjective:   No fever, chills, chest pain or shortness of breath.  Objective:    Vitals:   04/20/21 2118 04/21/21 0457 04/21/21 0826 04/21/21 1216  BP: 131/86 124/81 110/78 119/75  Pulse: 77 66 75 69  Resp: 18 20 16 16   Temp: 98 F (36.7 C) 98 F (36.7 C) 97.9 F (36.6 C) 97.8 F (36.6 C)  TempSrc:      SpO2: 100% 100% 100% 100%  Weight:      Height:       No data found.   Intake/Output Summary (Last 24 hours) at 04/21/2021 1446 Last data filed at 04/21/2021 0500 Gross per 24 hour  Intake 550 ml  Output 500 ml  Net 50 ml   Filed Weights   04/19/21 0547  Weight: 54.4 kg    Exam:  GEN: NAD SKIN: No rash EYES: EOMI ENT: MMM CV: RRR PULM: CTA B ABD: soft, ND, NT, +BS CNS: AAO x 3, non focal EXT: No edema or tenderness        Data Reviewed:   I have personally reviewed following labs and  imaging  studies:  Labs: Labs show the following:   Basic Metabolic Panel: Recent Labs  Lab 04/19/21 0604 04/20/21 0637 04/21/21 0557  NA 134* 137 138  K 3.3* 3.8 3.7  CL 102 108 109  CO2 25 25 24   GLUCOSE 98 82 93  BUN 8 9 10   CREATININE 0.73 0.66 0.76  CALCIUM 7.8* 8.3* 8.4*  MG  --  1.7 1.6*  PHOS  --  2.6 3.3   GFR Estimated Creatinine Clearance: 71.8 mL/min (by C-G formula based on SCr of 0.76 mg/dL). Liver Function Tests: Recent Labs  Lab 04/19/21 0604 04/20/21 0637 04/21/21 0557  AST 28 21 23   ALT 18 16 17   ALKPHOS 67 59 59  BILITOT 1.7* 1.0 0.8  PROT 6.9 6.5 6.7  ALBUMIN 3.3* 2.9* 3.0*   No results for input(s): LIPASE, AMYLASE in the last 168 hours. No results for input(s): AMMONIA in the last 168 hours. Coagulation profile Recent Labs  Lab 04/19/21 0604  INR 1.2    CBC: Recent Labs  Lab 04/19/21 0604 04/20/21 0637 04/21/21 0557  WBC 5.3 4.2 3.5*  NEUTROABS 3.7 2.8 1.9  HGB 12.8* 12.4* 12.2*  HCT 35.2* 34.6* 34.0*  MCV 93.1 93.8 91.4  PLT 103* 119* 136*   Cardiac Enzymes: No results for input(s): CKTOTAL, CKMB, CKMBINDEX, TROPONINI in the last 168 hours. BNP (last 3 results) No results for input(s): PROBNP in the last 8760 hours. CBG: No results for input(s): GLUCAP in the last 168 hours. D-Dimer: Recent Labs    04/20/21 0637 04/21/21 0557  DDIMER 0.53* 0.49   Hgb A1c: No results for input(s): HGBA1C in the last 72 hours. Lipid Profile: No results for input(s): CHOL, HDL, LDLCALC, TRIG, CHOLHDL, LDLDIRECT in the last 72 hours. Thyroid function studies: No results for input(s): TSH, T4TOTAL, T3FREE, THYROIDAB in the last 72 hours.  Invalid input(s): FREET3 Anemia work up: Recent Labs    04/20/21 0637 04/21/21 0557  FERRITIN 574* 475*   Sepsis Labs: Recent Labs  Lab 04/19/21 0604 04/19/21 0856 04/20/21 0637 04/21/21 0557  PROCALCITON  --  0.18 0.23 0.14  WBC 5.3  --  4.2 3.5*  LATICACIDVEN 1.4 1.2  --   --      Microbiology Recent Results (from the past 240 hour(s))  Resp Panel by RT-PCR (Flu A&B, Covid) Nasopharyngeal Swab     Status: Abnormal   Collection Time: 04/19/21  6:04 AM   Specimen: Nasopharyngeal Swab; Nasopharyngeal(NP) swabs in vial transport medium  Result Value Ref Range Status   SARS Coronavirus 2 by RT PCR POSITIVE (A) NEGATIVE Final    Comment: RESULT CALLED TO, READ BACK BY AND VERIFIED WITH: DELSHAW GIVENS ON 04/19/21 AT 0706 QSD (NOTE) SARS-CoV-2 target nucleic acids are DETECTED.  The SARS-CoV-2 RNA is generally detectable in upper respiratory specimens during the acute phase of infection. Positive results are indicative of the presence of the identified virus, but do not rule out bacterial infection or co-infection with other pathogens not detected by the test. Clinical correlation with patient history and other diagnostic information is necessary to determine patient infection status. The expected result is Negative.  Fact Sheet for Patients: EntrepreneurPulse.com.au  Fact Sheet for Healthcare Providers: IncredibleEmployment.be  This test is not yet approved or cleared by the [place] FDA and  has been authorized for detection and/or diagnosis of SARS-CoV-2 by FDA under an Emergency Use Authorization (EUA).  This EUA will remain in effect (meaning this test ca n be used) for  the duration of  the COVID-19 declaration under Section 564(b)(1) of the Act, 21 U.S.C. section 360bbb-3(b)(1), unless the authorization is terminated or revoked sooner.     Influenza A by PCR NEGATIVE NEGATIVE Final   Influenza B by PCR NEGATIVE NEGATIVE Final    Comment: (NOTE) The Xpert Xpress SARS-CoV-2/FLU/RSV plus assay is intended as an aid in the diagnosis of influenza from Nasopharyngeal swab specimens and should not be used as a sole basis for treatment. Nasal washings and aspirates are unacceptable for Xpert Xpress  SARS-CoV-2/FLU/RSV testing.  Fact Sheet for Patients: EntrepreneurPulse.com.au  Fact Sheet for Healthcare Providers: IncredibleEmployment.be  This test is not yet approved or cleared by the [place] FDA and has been authorized for detection and/or diagnosis of SARS-CoV-2 by FDA under an Emergency Use Authorization (EUA). This EUA will remain in effect (meaning this test can be used) for the duration of the COVID-19 declaration under Section 564(b)(1) of the Act, 21 U.S.C. section 360bbb-3(b)(1), unless the authorization is terminated or revoked.  Performed at [facility], [address] [place], [place] [address]   Culture, blood (Routine x 2)     Status: None (Preliminary result)   Collection Time: 04/19/21  6:04 AM   Specimen: BLOOD  Result Value Ref Range Status   Specimen Description BLOOD LEFT ANTECUBITAL  Final   Special Requests   Final    BOTTLES DRAWN AEROBIC AND ANAEROBIC Blood Culture results may not be optimal due to an excessive volume of blood received in culture bottles   Culture   Final    NO GROWTH 2 DAYS Performed at [facility], [address], [place], [place] [address]    Report Status PENDING  Incomplete  Culture, blood (Routine x 2)     Status: Abnormal (Preliminary result)   Collection Time: 04/19/21  6:04 AM   Specimen: BLOOD  Result Value Ref Range Status   Specimen Description   Final    BLOOD BLOOD RIGHT FOREARM Performed at [facility], [address] [place], [place] [address]    Special Requests   Final    BOTTLES DRAWN AEROBIC AND ANAEROBIC Blood Culture adequate volume Performed at [facility], [address] [place], [place] [address]    Culture  Setup Time   Final    GRAM POSITIVE COCCI ANAEROBIC BOTTLE ONLY CRITICAL RESULT CALLED TO, READ BACK BY AND VERIFIED WITH: JASON ROBINS @0233  ON 06.12.22.LFD    Culture (A)  Final    STAPHYLOCOCCUS  LUGDUNENSIS SUSCEPTIBILITIES TO FOLLOW Performed at [facility], [place] [address] [place], [place] [address]    Report Status PENDING  Incomplete  Urine Culture     Status: None   Collection Time: 04/19/21  6:04 AM   Specimen: Urine, Random  Result Value Ref Range Status   Specimen Description   Final    URINE, RANDOM Performed at [facility], [address] [place], [place] [address]    Special Requests   Final    NONE Performed at [facility], [address], [place], [place] [address]    Culture   Final    NO GROWTH Performed at [facility], [place] [address] [place], [place] [address]    Report Status 04/20/2021 FINAL  Final  Blood Culture ID Panel (Reflexed)     Status: Abnormal   Collection Time: 04/19/21  6:04 AM  Result Value Ref Range Status   Enterococcus faecalis NOT DETECTED NOT DETECTED  Final   Enterococcus Faecium NOT DETECTED NOT DETECTED Final   Listeria monocytogenes NOT DETECTED NOT DETECTED Final   Staphylococcus species DETECTED (A) NOT DETECTED Final    Comment: CRITICAL RESULT CALLED TO, READ BACK BY AND VERIFIED WITH: JASON ROBINS @0233  04/21/2021 LFD    Staphylococcus aureus (BCID) NOT DETECTED NOT DETECTED Final   Staphylococcus epidermidis NOT DETECTED NOT DETECTED Final   Staphylococcus lugdunensis DETECTED (A) NOT DETECTED Final    Comment: CRITICAL RESULT CALLED TO, READ BACK BY AND VERIFIED WITH: JASON ROBINS @0233  04/20/2021 LFD    Streptococcus species NOT DETECTED NOT DETECTED Final   Streptococcus agalactiae NOT DETECTED NOT DETECTED Final   Streptococcus pneumoniae NOT DETECTED NOT DETECTED Final   Streptococcus pyogenes NOT DETECTED NOT DETECTED Final   A.calcoaceticus-baumannii NOT DETECTED NOT DETECTED Final   Bacteroides fragilis NOT DETECTED NOT DETECTED Final   Enterobacterales NOT DETECTED NOT DETECTED Final   Enterobacter cloacae complex NOT DETECTED NOT DETECTED Final   Escherichia coli NOT  DETECTED NOT DETECTED Final   Klebsiella aerogenes NOT DETECTED NOT DETECTED Final   Klebsiella oxytoca NOT DETECTED NOT DETECTED Final   Klebsiella pneumoniae NOT DETECTED NOT DETECTED Final   Proteus species NOT DETECTED NOT DETECTED Final   Salmonella species NOT DETECTED NOT DETECTED Final   Serratia marcescens NOT DETECTED NOT DETECTED Final   Haemophilus influenzae NOT DETECTED NOT DETECTED Final   Neisseria meningitidis NOT DETECTED NOT DETECTED Final   Pseudomonas aeruginosa NOT DETECTED NOT DETECTED Final   Stenotrophomonas maltophilia NOT DETECTED NOT DETECTED Final   Candida albicans NOT DETECTED NOT DETECTED Final   Candida auris NOT DETECTED NOT DETECTED Final   Candida glabrata NOT DETECTED NOT DETECTED Final   Candida krusei NOT DETECTED NOT DETECTED Final   Candida parapsilosis NOT DETECTED NOT DETECTED Final   Candida tropicalis NOT DETECTED NOT DETECTED Final   Cryptococcus neoformans/gattii NOT DETECTED NOT DETECTED Final   Methicillin resistance mecA/C NOT DETECTED NOT DETECTED Final    Comment: Performed at [facility], [address] [place], [place] [address]    Procedures and diagnostic studies:  No results found.             LOS: 2 days   Navi Erber  [facility]   Pager on [email or web address]. If 7PM-7AM, please contact night-coverage at [email or web address]     04/21/2021, 2:46 PM

## 2021-04-21 NOTE — Progress Notes (Addendum)
Initial Nutrition Assessment  DOCUMENTATION CODES:   Severe malnutrition in context of chronic illness  INTERVENTION:   Ensure Enlive po TID, each supplement provides 350 kcal and 20 grams of protein  Magic cup TID with meals, each supplement provides 290 kcal and 9 grams of protein  MVI po daily   Pt at high refeed risk; recommend monitor potassium, magnesium and phosphorus labs daily until stable  NUTRITION DIAGNOSIS:   Severe Malnutrition related to chronic illness (cirrhosis, etoh abuse) as evidenced by severe fat depletion, severe muscle depletion.  GOAL:   Patient will meet greater than or equal to 90% of their needs  MONITOR:   PO intake, Supplement acceptance, Labs, Weight trends, Skin, I & O's  REASON FOR ASSESSMENT:   Malnutrition Screening Tool    ASSESSMENT:   65 y.o. male with medical history significant of liver cirrhosis, alcohol abuse, substance abuse, tobacco abuse and recent admission for salmonella gastroenteritis who is admitted with COVID 19  Pt is familiar to nutrition department from a recent previous admit. Pt reports good appetite and oral intake at baseline but reports that he eat small meals at home. Pt reports decreased appetite and oral intake since his last admit but reports that his appetite has been poor for the past 24 hours pta. Pt was eating 75-100% of his meals prior to his last discharge. Pt eating only sips and bites of meals today. Pt is agreeable to drink Ensure supplements. RD will add supplements to help pt meet his estimated needs. Pt is likely at refeed risk. Per chart, pt appears weight stable for the past year.   Medications reviewed and include: vitamin C, azithromycin, lovenox, thiamine, folic acid, zinc, cefazolin  Labs reviewed: K 3.7 wnl, P 3.3 wnl, Mg 1.6(L) Wbc- 3.5(L)  NUTRITION - FOCUSED PHYSICAL EXAM:  Flowsheet Row Most Recent Value  Orbital Region Moderate depletion  Upper Arm Region Severe depletion  Thoracic  and Lumbar Region Severe depletion  Buccal Region Severe depletion  Temple Region Moderate depletion  Clavicle Bone Region Severe depletion  Clavicle and Acromion Bone Region Severe depletion  Scapular Bone Region Severe depletion  Dorsal Hand Severe depletion  Patellar Region Severe depletion  Anterior Thigh Region Severe depletion  Posterior Calf Region Severe depletion  Edema (RD Assessment) None  Hair Reviewed  Eyes Reviewed  Mouth Reviewed  Skin Reviewed  Nails Reviewed      Diet Order:   Diet Order             Diet regular Room service appropriate? Yes; Fluid consistency: Thin  Diet effective now                  EDUCATION NEEDS:   Education needs have been addressed  Skin:  Skin Assessment: Reviewed RN Assessment  Last BM:  6/12- type 4  Height:   Ht Readings from Last 1 Encounters:  04/19/21 5\' 8"  (1.727 m)    Weight:   Wt Readings from Last 1 Encounters:  04/19/21 54.4 kg    Ideal Body Weight:  70 kg  BMI:  Body mass index is 18.25 kg/m.  Estimated Nutritional Needs:   Kcal:  1800-2100kcal/day  Protein:  90-105g/day  Fluid:  1.6-1.9L/day  Koleen Distance MS, RD, LDN Please refer to [facility] for RD and/or RD on-call/weekend/after hours pager

## 2021-04-21 NOTE — TOC Progression Note (Signed)
Transition of Care [facility]) - Progression Note    Patient Details  Name: Mark Carr MRN: [id number] Date of Birth: 12-Apr-1956  Transition of Care [facility]) CM/SW [place], RN Phone Number: 04/21/2021, 9:25 AM  Clinical Narrative:   Patient lives at home with wife, states wife can assist him with care if needed.  Patient has no issues getting to appointments and getting medications.  He takes his medications as appropriate, and he has no concerns about going home after discharge.  The patient has no current home health services at this time.  Denies other TOC needs.  TOC contact information given, TOC to follow for needs.    Expected Discharge Plan: Home/Self Care Barriers to Discharge: Continued Medical Work up  Expected Discharge Plan and Services Expected Discharge Plan: Home/Self Care   Discharge Planning Services: CM Consult   Living arrangements for the past 2 months: Single Family Home                                       Social Determinants of Health (SDOH) Interventions    Readmission Risk Interventions No flowsheet data found.

## 2021-04-22 LAB — CBC WITH DIFFERENTIAL/PLATELET
Abs Immature Granulocytes: 0.02 10*3/uL (ref 0.00–0.07)
Basophils Absolute: 0 10*3/uL (ref 0.0–0.1)
Basophils Relative: 0 %
Eosinophils Absolute: 0 10*3/uL (ref 0.0–0.5)
Eosinophils Relative: 1 %
HCT: 32.1 % — ABNORMAL LOW (ref 39.0–52.0)
Hemoglobin: 11.5 g/dL — ABNORMAL LOW (ref 13.0–17.0)
Immature Granulocytes: 1 %
Lymphocytes Relative: 39 %
Lymphs Abs: 1.4 10*3/uL (ref 0.7–4.0)
MCH: 33.1 pg (ref 26.0–34.0)
MCHC: 35.8 g/dL (ref 30.0–36.0)
MCV: 92.5 fL (ref 80.0–100.0)
Monocytes Absolute: 0.3 10*3/uL (ref 0.1–1.0)
Monocytes Relative: 8 %
Neutro Abs: 1.9 10*3/uL (ref 1.7–7.7)
Neutrophils Relative %: 51 %
Platelets: 142 10*3/uL — ABNORMAL LOW (ref 150–400)
RBC: 3.47 MIL/uL — ABNORMAL LOW (ref 4.22–5.81)
RDW: 12.2 % (ref 11.5–15.5)
Smear Review: NORMAL
WBC: 3.6 10*3/uL — ABNORMAL LOW (ref 4.0–10.5)
nRBC: 0 % (ref 0.0–0.2)

## 2021-04-22 LAB — COMPREHENSIVE METABOLIC PANEL
ALT: 14 U/L (ref 0–44)
AST: 23 U/L (ref 15–41)
Albumin: 3.2 g/dL — ABNORMAL LOW (ref 3.5–5.0)
Alkaline Phosphatase: 62 U/L (ref 38–126)
Anion gap: 5 (ref 5–15)
BUN: 11 mg/dL (ref 8–23)
CO2: 27 mmol/L (ref 22–32)
Calcium: 8.6 mg/dL — ABNORMAL LOW (ref 8.9–10.3)
Chloride: 106 mmol/L (ref 98–111)
Creatinine, Ser: 0.62 mg/dL (ref 0.61–1.24)
GFR, Estimated: >60 mL/min (ref >60)
Glucose, Bld: 105 mg/dL — ABNORMAL HIGH (ref 70–99)
Potassium: 3.9 mmol/L (ref 3.5–5.1)
Sodium: 138 mmol/L (ref 135–145)
Total Bilirubin: 0.8 mg/dL (ref 0.3–1.2)
Total Protein: 6.6 g/dL (ref 6.5–8.1)

## 2021-04-22 LAB — CULTURE, BLOOD (ROUTINE X 2): Special Requests: ADEQUATE

## 2021-04-22 LAB — D-DIMER, QUANTITATIVE: D-Dimer, Quant: 0.43 ug/mL-FEU (ref 0.00–0.50)

## 2021-04-22 LAB — GLUCOSE, CAPILLARY: Glucose-Capillary: 110 mg/dL — ABNORMAL HIGH (ref 70–99)

## 2021-04-22 LAB — FERRITIN: Ferritin: 402 ng/mL — ABNORMAL HIGH (ref 24–336)

## 2021-04-22 LAB — MAGNESIUM: Magnesium: 1.7 mg/dL (ref 1.7–2.4)

## 2021-04-22 LAB — PHOSPHORUS: Phosphorus: 3.3 mg/dL (ref 2.5–4.6)

## 2021-04-22 LAB — C-REACTIVE PROTEIN: CRP: 1.2 mg/dL — ABNORMAL HIGH (ref <1.0)

## 2021-04-22 NOTE — Progress Notes (Addendum)
Progress Note    Mark Carr  NAT:[id number] DOB: [birth year]  DOA: 04/19/2021 PCP: Virginia Crews, MD      Brief Narrative:    Medical records reviewed and are as summarized below:  Mark Carr is a 65 y.o. male with medical history significant for nicotine dependence, liver cirrhosis, history of alcohol use disorder, who was recently discharged from the hospital after treatment for Salmonella gastroenteritis, AKI and and a fall.  He presented to the hospital because of fever (T-max 101 F) generalized weakness and dizziness.  He tested positive COVID-19 infection.  He was treated with with IV remdesivir for COVID-19 infection.  He was also found to have staph lugdunensis bacteremia.  It was not clear whether this was a contaminant or not.  ID was consulted to assist with management.  He was treated with IV cefazolin.  2D echo did not show any evidence of vegetations or endocarditis.   Assessment/Plan:   Principal Problem:   Pneumonia due to COVID-19 virus Active Problems:   Tobacco use disorder   Cirrhosis of liver (HCC)   Hypokalemia   Coag negative Staphylococcus bacteremia    Body mass index is 18.25 kg/m.   Fever, COVID-19 pneumonia, suspected superimposed bacterial infection, Staph lugdunensis bacteremia: 1 out of 4 bottles showed staph lugdunensis.  Unclear whether this is a contaminant or not.  Continue IV cefazolin and IV remdesivir.  No growth on repeat blood cultures thus far.  Follow-up cultures.  No evidence of vegetations or endocarditis on 2D echo.  Case discussed with Dr. Steva Ready, [place] specialist.  She recommended completing IV cefazolin on 04/23/2021.    Abnormal urinalysis: Asymptomatic.  No growth on urine culture.   Hypomagnesemia: Improved.  Liver cirrhosis: Compensated  Chronic thrombocytopenia: This is likely from liver cirrhosis.  Tobacco use disorder: Counseled to quit smoking cigarettes.  History of alcohol use disorder: He  said he quit drinking alcohol about a month prior to admission.   Diet Order             Diet regular Room service appropriate? Yes; Fluid consistency: Thin  Diet effective now                      Consultants: Infectious disease  Procedures: None    Medications:    albuterol  2 puff Inhalation Q6H   vitamin C  500 mg Oral Daily   azithromycin  500 mg Oral Daily   enoxaparin (LOVENOX) injection  40 mg Subcutaneous Q24H   feeding supplement  237 mL Oral TID BM   folic acid  1 mg Oral Daily   multivitamin with minerals  1 tablet Oral Daily   nicotine  21 mg Transdermal Daily   sodium chloride flush  3 mL Intravenous Q12H   thiamine  100 mg Oral Daily   zinc sulfate  220 mg Oral Daily   Continuous Infusions:  sodium chloride      ceFAZolin (ANCEF) IV 2 g (04/22/21 1316)   remdesivir 100 mg in NS 100 mL 100 mg (04/22/21 0957)     Anti-infectives (From admission, onward)    Start     Dose/Rate Route Frequency Ordered Stop   04/22/21 1000  azithromycin (ZITHROMAX) tablet 500 mg        500 mg Oral Daily 04/21/21 1650 04/24/21 0959   04/21/21 1000  azithromycin (ZITHROMAX) tablet 500 mg  Status:  Discontinued        500 mg  Oral Daily 04/20/21 1238 04/21/21 1446   04/20/21 2200  ceFAZolin (ANCEF) IVPB 2g/100 mL premix        2 g 200 mL/hr over 30 Minutes Intravenous Every 8 hours 04/20/21 1131     04/20/21 1000  remdesivir 100 mg in sodium chloride 0.9 % 100 mL IVPB  Status:  Discontinued       See Hyperspace for full Linked Orders Report.   100 mg 200 mL/hr over 30 Minutes Intravenous Daily 04/19/21 0722 04/19/21 0844   04/20/21 1000  remdesivir 100 mg in sodium chloride 0.9 % 100 mL IVPB       See Hyperspace for full Linked Orders Report.   100 mg 200 mL/hr over 30 Minutes Intravenous Daily 04/19/21 0843 04/24/21 0959   04/20/21 1000  cefTRIAXone (ROCEPHIN) 2 g in sodium chloride 0.9 % 100 mL IVPB  Status:  Discontinued        2 g 200 mL/hr over 30 Minutes  Intravenous Every 24 hours 04/19/21 0844 04/20/21 1131   04/20/21 1000  azithromycin (ZITHROMAX) 500 mg in sodium chloride 0.9 % 250 mL IVPB  Status:  Discontinued        500 mg 250 mL/hr over 60 Minutes Intravenous Every 24 hours 04/19/21 0844 04/20/21 1238   04/19/21 1000  remdesivir 200 mg in sodium chloride 0.9% 250 mL IVPB  Status:  Discontinued       See Hyperspace for full Linked Orders Report.   200 mg 580 mL/hr over 30 Minutes Intravenous Once 04/19/21 0722 04/19/21 0844   04/19/21 1000  remdesivir 200 mg in sodium chloride 0.9% 250 mL IVPB       See Hyperspace for full Linked Orders Report.   200 mg 580 mL/hr over 30 Minutes Intravenous Once 04/19/21 0843 04/19/21 1452   04/19/21 0730  cefTRIAXone (ROCEPHIN) 1 g in sodium chloride 0.9 % 100 mL IVPB        1 g 200 mL/hr over 30 Minutes Intravenous  Once 04/19/21 0719 04/19/21 1008   04/19/21 0730  azithromycin (ZITHROMAX) 500 mg in sodium chloride 0.9 % 250 mL IVPB        500 mg 250 mL/hr over 60 Minutes Intravenous  Once 04/19/21 0719 04/19/21 1008              Family Communication/Anticipated D/C date and plan/Code Status   DVT prophylaxis: enoxaparin (LOVENOX) injection 40 mg Start: 04/20/21 2200 SCDs Start: 04/19/21 0842     Code Status: Full Code  Family Communication: None Disposition Plan:    Status is: Inpatient  Remains inpatient appropriate because:IV treatments appropriate due to intensity of illness or inability to take PO  Dispo: The patient is from: Home              Anticipated d/c is to: Home              Patient currently is not medically stable to d/c.   Difficult to place patient Yes           Subjective:   Notable events noted.  No fever, chest pain or shortness of breath.  Objective:    Vitals:   04/22/21 0510 04/22/21 0511 04/22/21 0700 04/22/21 1147  BP: 126/81  122/78 123/80  Pulse: 71  78 79  Resp: 17  18 18   Temp:  98.1 F (36.7 C) 98.1 F (36.7 C) 97.8 F  (36.6 C)  TempSrc:  Oral Oral   SpO2: 100%  100% 99%  Weight:  Height:       No data found.   Intake/Output Summary (Last 24 hours) at 04/22/2021 1721 Last data filed at 04/22/2021 1500 Gross per 24 hour  Intake --  Output 1150 ml  Net -1150 ml   Filed Weights   04/19/21 0547  Weight: 54.4 kg    Exam:   GEN: NAD SKIN: No rash EYES: EOMI ENT: MMM CV: RRR PULM: CTA B ABD: soft, ND, NT, +BS CNS: AAO x 3, non focal EXT: No edema or tenderness       Data Reviewed:   I have personally reviewed following labs and imaging studies:  Labs: Labs show the following:   Basic Metabolic Panel: Recent Labs  Lab 04/19/21 0604 04/20/21 0637 04/21/21 0557 04/22/21 0539  NA 134* 137 138 138  K 3.3* 3.8 3.7 3.9  CL 102 108 109 106  CO2 25 25 24 27   GLUCOSE 98 82 93 105*  BUN 8 9 10 11   CREATININE 0.73 0.66 0.76 0.62  CALCIUM 7.8* 8.3* 8.4* 8.6*  MG  --  1.7 1.6* 1.7  PHOS  --  2.6 3.3 3.3   GFR Estimated Creatinine Clearance: 71.8 mL/min (by C-G formula based on SCr of 0.62 mg/dL). Liver Function Tests: Recent Labs  Lab 04/19/21 0604 04/20/21 0637 04/21/21 0557 04/22/21 0539  AST 28 21 23 23   ALT 18 16 17 14   ALKPHOS 67 59 59 62  BILITOT 1.7* 1.0 0.8 0.8  PROT 6.9 6.5 6.7 6.6  ALBUMIN 3.3* 2.9* 3.0* 3.2*   No results for input(s): LIPASE, AMYLASE in the last 168 hours. No results for input(s): AMMONIA in the last 168 hours. Coagulation profile Recent Labs  Lab 04/19/21 0604  INR 1.2    CBC: Recent Labs  Lab 04/19/21 0604 04/20/21 0637 04/21/21 0557 04/22/21 0539  WBC 5.3 4.2 3.5* 3.6*  NEUTROABS 3.7 2.8 1.9 1.9  HGB 12.8* 12.4* 12.2* 11.5*  HCT 35.2* 34.6* 34.0* 32.1*  MCV 93.1 93.8 91.4 92.5  PLT 103* 119* 136* 142*   Cardiac Enzymes: No results for input(s): CKTOTAL, CKMB, CKMBINDEX, TROPONINI in the last 168 hours. BNP (last 3 results) No results for input(s): PROBNP in the last 8760 hours. CBG: Recent Labs  Lab  04/22/21 0817  GLUCAP 110*   D-Dimer: Recent Labs    04/21/21 0557 04/22/21 0539  DDIMER 0.49 0.43   Hgb A1c: No results for input(s): HGBA1C in the last 72 hours. Lipid Profile: No results for input(s): CHOL, HDL, LDLCALC, TRIG, CHOLHDL, LDLDIRECT in the last 72 hours. Thyroid function studies: No results for input(s): TSH, T4TOTAL, T3FREE, THYROIDAB in the last 72 hours.  Invalid input(s): FREET3 Anemia work up: Recent Labs    04/21/21 0557 04/22/21 0539  FERRITIN 475* 402*   Sepsis Labs: Recent Labs  Lab 04/19/21 0604 04/19/21 0856 04/20/21 0637 04/21/21 0557 04/22/21 0539  PROCALCITON  --  0.18 0.23 0.14  --   WBC 5.3  --  4.2 3.5* 3.6*  LATICACIDVEN 1.4 1.2  --   --   --     Microbiology Recent Results (from the past 240 hour(s))  Resp Panel by RT-PCR (Flu A&B, Covid) Nasopharyngeal Swab     Status: Abnormal   Collection Time: 04/19/21  6:04 AM   Specimen: Nasopharyngeal Swab; Nasopharyngeal(NP) swabs in vial transport medium  Result Value Ref Range Status   SARS Coronavirus 2 by RT PCR POSITIVE (A) NEGATIVE Final    Comment: RESULT CALLED TO, READ BACK BY AND  VERIFIED WITH: DELSHAW GIVENS ON 04/19/21 AT 0706 QSD (NOTE) SARS-CoV-2 target nucleic acids are DETECTED.  The SARS-CoV-2 RNA is generally detectable in upper respiratory specimens during the acute phase of infection. Positive results are indicative of the presence of the identified virus, but do not rule out bacterial infection or co-infection with other pathogens not detected by the test. Clinical correlation with patient history and other diagnostic information is necessary to determine patient infection status. The expected result is Negative.  Fact Sheet for Patients: EntrepreneurPulse.com.au  Fact Sheet for Healthcare Providers: IncredibleEmployment.be  This test is not yet approved or cleared by the [place] FDA and  has been authorized for  detection and/or diagnosis of SARS-CoV-2 by FDA under an Emergency Use Authorization (EUA).  This EUA will remain in effect (meaning this test ca n be used) for the duration of  the COVID-19 declaration under Section 564(b)(1) of the Act, 21 U.S.C. section 360bbb-3(b)(1), unless the authorization is terminated or revoked sooner.     Influenza A by PCR NEGATIVE NEGATIVE Final   Influenza B by PCR NEGATIVE NEGATIVE Final    Comment: (NOTE) The Xpert Xpress SARS-CoV-2/FLU/RSV plus assay is intended as an aid in the diagnosis of influenza from Nasopharyngeal swab specimens and should not be used as a sole basis for treatment. Nasal washings and aspirates are unacceptable for Xpert Xpress SARS-CoV-2/FLU/RSV testing.  Fact Sheet for Patients: EntrepreneurPulse.com.au  Fact Sheet for Healthcare Providers: IncredibleEmployment.be  This test is not yet approved or cleared by the [place] FDA and has been authorized for detection and/or diagnosis of SARS-CoV-2 by FDA under an Emergency Use Authorization (EUA). This EUA will remain in effect (meaning this test can be used) for the duration of the COVID-19 declaration under Section 564(b)(1) of the Act, 21 U.S.C. section 360bbb-3(b)(1), unless the authorization is terminated or revoked.  Performed at [facility], [address] [place], [place] [address]   Culture, blood (Routine x 2)     Status: None (Preliminary result)   Collection Time: 04/19/21  6:04 AM   Specimen: BLOOD  Result Value Ref Range Status   Specimen Description BLOOD LEFT ANTECUBITAL  Final   Special Requests   Final    BOTTLES DRAWN AEROBIC AND ANAEROBIC Blood Culture results may not be optimal due to an excessive volume of blood received in culture bottles   Culture   Final    NO GROWTH 3 DAYS Performed at [facility], [address] [place], [place] [address]    Report Status PENDING  Incomplete   Culture, blood (Routine x 2)     Status: Abnormal   Collection Time: 04/19/21  6:04 AM   Specimen: BLOOD  Result Value Ref Range Status   Specimen Description   Final    BLOOD BLOOD RIGHT FOREARM Performed at [facility], [address] [place], [place] [address]    Special Requests   Final    BOTTLES DRAWN AEROBIC AND ANAEROBIC Blood Culture adequate volume Performed at [facility], [address] [place], [place] [address]    Culture  Setup Time   Final    GRAM POSITIVE COCCI ANAEROBIC BOTTLE ONLY CRITICAL RESULT CALLED TO, READ BACK BY AND VERIFIED WITH: JASON ROBINS @0233  ON 06.12.22.LFD    Culture STAPHYLOCOCCUS LUGDUNENSIS (A)  Final   Report Status 04/22/2021 FINAL  Final   Organism ID, Bacteria STAPHYLOCOCCUS LUGDUNENSIS  Final      Susceptibility   Staphylococcus lugdunensis - MIC*  CIPROFLOXACIN <=0.5 SENSITIVE Sensitive     ERYTHROMYCIN <=0.25 SENSITIVE Sensitive     GENTAMICIN <=0.5 SENSITIVE Sensitive     OXACILLIN 2 SENSITIVE Sensitive     TETRACYCLINE <=1 SENSITIVE Sensitive     VANCOMYCIN <=0.5 SENSITIVE Sensitive     TRIMETH/SULFA <=10 SENSITIVE Sensitive     CLINDAMYCIN <=0.25 SENSITIVE Sensitive     RIFAMPIN <=0.5 SENSITIVE Sensitive     Inducible Clindamycin NEGATIVE Sensitive     * STAPHYLOCOCCUS LUGDUNENSIS  Urine Culture     Status: None   Collection Time: 04/19/21  6:04 AM   Specimen: Urine, Random  Result Value Ref Range Status   Specimen Description   Final    URINE, RANDOM Performed at [facility], [address], [place], [place] [address]    Special Requests   Final    NONE Performed at [facility], [address] [place], [place] [address]    Culture   Final    NO GROWTH Performed at [facility], [place] [address], [place], [place] [address]    Report Status 04/20/2021 FINAL  Final  Blood Culture ID Panel (Reflexed)     Status: Abnormal   Collection Time: 04/19/21  6:04 AM  Result  Value Ref Range Status   Enterococcus faecalis NOT DETECTED NOT DETECTED Final   Enterococcus Faecium NOT DETECTED NOT DETECTED Final   Listeria monocytogenes NOT DETECTED NOT DETECTED Final   Staphylococcus species DETECTED (A) NOT DETECTED Final    Comment: CRITICAL RESULT CALLED TO, READ BACK BY AND VERIFIED WITH: JASON ROBINS @0233  04/21/2021 LFD    Staphylococcus aureus (BCID) NOT DETECTED NOT DETECTED Final   Staphylococcus epidermidis NOT DETECTED NOT DETECTED Final   Staphylococcus lugdunensis DETECTED (A) NOT DETECTED Final    Comment: CRITICAL RESULT CALLED TO, READ BACK BY AND VERIFIED WITH: JASON ROBINS @0233  04/20/2021 LFD    Streptococcus species NOT DETECTED NOT DETECTED Final   Streptococcus agalactiae NOT DETECTED NOT DETECTED Final   Streptococcus pneumoniae NOT DETECTED NOT DETECTED Final   Streptococcus pyogenes NOT DETECTED NOT DETECTED Final   A.calcoaceticus-baumannii NOT DETECTED NOT DETECTED Final   Bacteroides fragilis NOT DETECTED NOT DETECTED Final   Enterobacterales NOT DETECTED NOT DETECTED Final   Enterobacter cloacae complex NOT DETECTED NOT DETECTED Final   Escherichia coli NOT DETECTED NOT DETECTED Final   Klebsiella aerogenes NOT DETECTED NOT DETECTED Final   Klebsiella oxytoca NOT DETECTED NOT DETECTED Final   Klebsiella pneumoniae NOT DETECTED NOT DETECTED Final   Proteus species NOT DETECTED NOT DETECTED Final   Salmonella species NOT DETECTED NOT DETECTED Final   Serratia marcescens NOT DETECTED NOT DETECTED Final   Haemophilus influenzae NOT DETECTED NOT DETECTED Final   Neisseria meningitidis NOT DETECTED NOT DETECTED Final   Pseudomonas aeruginosa NOT DETECTED NOT DETECTED Final   Stenotrophomonas maltophilia NOT DETECTED NOT DETECTED Final   Candida albicans NOT DETECTED NOT DETECTED Final   Candida auris NOT DETECTED NOT DETECTED Final   Candida glabrata NOT DETECTED NOT DETECTED Final   Candida krusei NOT DETECTED NOT DETECTED Final    Candida parapsilosis NOT DETECTED NOT DETECTED Final   Candida tropicalis NOT DETECTED NOT DETECTED Final   Cryptococcus neoformans/gattii NOT DETECTED NOT DETECTED Final   Methicillin resistance mecA/C NOT DETECTED NOT DETECTED Final    Comment: Performed at [facility], [address] [place], [place] [address]  CULTURE, BLOOD (ROUTINE X 2) w Reflex to ID Panel     Status:  None (Preliminary result)   Collection Time: 04/21/21  9:34 AM   Specimen: BLOOD  Result Value Ref Range Status   Specimen Description BLOOD RIGHT HAND  Final   Special Requests   Final    BOTTLES DRAWN AEROBIC AND ANAEROBIC Blood Culture adequate volume   Culture   Final    NO GROWTH < 24 HOURS Performed at [facility], [address] [place], [place] [address]    Report Status PENDING  Incomplete  CULTURE, BLOOD (ROUTINE X 2) w Reflex to ID Panel     Status: None (Preliminary result)   Collection Time: 04/21/21  9:34 AM   Specimen: BLOOD  Result Value Ref Range Status   Specimen Description BLOOD RIGHT [facility]  Final   Special Requests   Final    BOTTLES DRAWN AEROBIC AND ANAEROBIC Blood Culture adequate volume   Culture   Final    NO GROWTH < 24 HOURS Performed at [facility], [address] [place], [place] [address]    Report Status PENDING  Incomplete    Procedures and diagnostic studies:  ECHOCARDIOGRAM COMPLETE  Result Date: 04/21/2021    ECHOCARDIOGRAM REPORT   Patient Name:   Mark Carr Date of Exam: 04/21/2021 Medical Rec #:  [id number]      Height:       68.0 in Accession #:    [id number]     Weight:       120.0 lb Date of Birth:  1956-11-01      BSA:          1.645 m Patient Age:    28 years       BP:           110/78 mmHg Patient Gender: M              HR:           75 bpm. Exam Location:  [facility] Procedure: Cardiac Doppler and Color Doppler Indications:     bacteremia R78.81  History:         Patient has no prior history of Echocardiogram examinations. No                   prior cardiac history listed in chart.  Sonographer:     Sherrie Sport RDCS (AE) Referring Phys:  [place] Diagnosing Phys: Bartholome Bill MD  Sonographer Comments: No apical window and suboptimal subcostal window. Image acquisition challenging due to patient body habitus. IMPRESSIONS  1. Left ventricular ejection fraction, by estimation, is 55 to 60%. The left ventricle has normal function. The left ventricle has no regional wall motion abnormalities. There is moderate left ventricular hypertrophy. Left ventricular diastolic parameters are consistent with Grade I diastolic dysfunction (impaired relaxation).  2. Right ventricular systolic function is normal. The right ventricular size is normal.  3. The mitral valve is grossly normal. Trivial mitral valve regurgitation.  4. The aortic valve is tricuspid. Aortic valve regurgitation is trivial. FINDINGS  Left Ventricle: Left ventricular ejection fraction, by estimation, is 55 to 60%. The left ventricle has normal function. The left ventricle has no regional wall motion abnormalities. The left ventricular internal cavity size was normal in size. There is  moderate left ventricular hypertrophy. Left ventricular diastolic parameters are consistent with Grade I diastolic dysfunction (impaired relaxation). Right Ventricle: The right ventricular size is normal. No increase in right ventricular wall thickness. Right ventricular systolic function is normal. Left Atrium: Left atrial size was normal  in size. Right Atrium: Right atrial size was normal in size. Pericardium: There is no evidence of pericardial effusion. Mitral Valve: The mitral valve is grossly normal. Trivial mitral valve regurgitation. Tricuspid Valve: The tricuspid valve is grossly normal. Tricuspid valve regurgitation is trivial. Aortic Valve: The aortic valve is tricuspid. Aortic valve regurgitation is trivial. Pulmonic Valve: The pulmonic valve was not well visualized. Pulmonic valve  regurgitation is trivial. Aorta: The aortic root is normal in size and structure. IAS/Shunts: The atrial septum is grossly normal.  LEFT VENTRICLE PLAX 2D LVIDd:         4.27 cm LVIDs:         3.03 cm LV PW:         1.20 cm LV IVS:        1.07 cm LVOT diam:     2.00 cm LVOT Area:     3.14 cm  LEFT ATRIUM         Index LA diam:    3.40 cm 2.07 cm/m                        PULMONIC VALVE AORTA                 PV Vmax:        0.66 m/s Ao Root diam: 3.63 cm PV Peak grad:   1.8 mmHg                       RVOT Peak grad: 2 mmHg   SHUNTS Systemic Diam: 2.00 cm Bartholome Bill MD Electronically signed by Bartholome Bill MD Signature Date/Time: 04/21/2021/4:46:47 PM    Final                LOS: 3 days   Pricella Gaugh  [facility]   Pager on [email or web address]. If 7PM-7AM, please contact night-coverage at [email or web address]     04/22/2021, 5:21 PM

## 2021-04-23 LAB — COMPREHENSIVE METABOLIC PANEL
ALT: 12 U/L (ref 0–44)
AST: 22 U/L (ref 15–41)
Albumin: 3 g/dL — ABNORMAL LOW (ref 3.5–5.0)
Alkaline Phosphatase: 62 U/L (ref 38–126)
Anion gap: 9 (ref 5–15)
BUN: 8 mg/dL (ref 8–23)
CO2: 26 mmol/L (ref 22–32)
Calcium: 8.9 mg/dL (ref 8.9–10.3)
Chloride: 103 mmol/L (ref 98–111)
Creatinine, Ser: 0.6 mg/dL — ABNORMAL LOW (ref 0.61–1.24)
GFR, Estimated: >60 mL/min (ref >60)
Glucose, Bld: 98 mg/dL (ref 70–99)
Potassium: 3.6 mmol/L (ref 3.5–5.1)
Sodium: 138 mmol/L (ref 135–145)
Total Bilirubin: 0.6 mg/dL (ref 0.3–1.2)
Total Protein: 6.5 g/dL (ref 6.5–8.1)

## 2021-04-23 LAB — CBC WITH DIFFERENTIAL/PLATELET
Abs Immature Granulocytes: 0.03 10*3/uL (ref 0.00–0.07)
Basophils Absolute: 0 10*3/uL (ref 0.0–0.1)
Basophils Relative: 0 %
Eosinophils Absolute: 0.1 10*3/uL (ref 0.0–0.5)
Eosinophils Relative: 2 %
HCT: 33.2 % — ABNORMAL LOW (ref 39.0–52.0)
Hemoglobin: 12.1 g/dL — ABNORMAL LOW (ref 13.0–17.0)
Immature Granulocytes: 1 %
Lymphocytes Relative: 43 %
Lymphs Abs: 1.6 10*3/uL (ref 0.7–4.0)
MCH: 33.1 pg (ref 26.0–34.0)
MCHC: 36.4 g/dL — ABNORMAL HIGH (ref 30.0–36.0)
MCV: 90.7 fL (ref 80.0–100.0)
Monocytes Absolute: 0.4 10*3/uL (ref 0.1–1.0)
Monocytes Relative: 11 %
Neutro Abs: 1.6 10*3/uL — ABNORMAL LOW (ref 1.7–7.7)
Neutrophils Relative %: 43 %
Platelets: 172 10*3/uL (ref 150–400)
RBC: 3.66 MIL/uL — ABNORMAL LOW (ref 4.22–5.81)
RDW: 12.1 % (ref 11.5–15.5)
Smear Review: NORMAL
WBC: 3.7 10*3/uL — ABNORMAL LOW (ref 4.0–10.5)
nRBC: 0 % (ref 0.0–0.2)

## 2021-04-23 LAB — FERRITIN: Ferritin: 412 ng/mL — ABNORMAL HIGH (ref 24–336)

## 2021-04-23 LAB — C-REACTIVE PROTEIN: CRP: 0.8 mg/dL (ref <1.0)

## 2021-04-23 LAB — MAGNESIUM: Magnesium: 1.6 mg/dL — ABNORMAL LOW (ref 1.7–2.4)

## 2021-04-23 LAB — D-DIMER, QUANTITATIVE: D-Dimer, Quant: 0.38 ug/mL-FEU (ref 0.00–0.50)

## 2021-04-23 LAB — PHOSPHORUS: Phosphorus: 3.5 mg/dL (ref 2.5–4.6)

## 2021-04-23 MED ORDER — MAGNESIUM SULFATE 2 GM/50ML IV SOLN
2.0000 g | Freq: Once | INTRAVENOUS | Status: AC
  Administered 2021-04-23: 2 g via INTRAVENOUS
  Filled 2021-04-23: qty 50

## 2021-04-23 NOTE — Progress Notes (Signed)
IV removed before discharge. Went over discharge instructions with patient and his daughter. Both stated they understood and all questions were answered. Patient being discharging home with daughter POV.

## 2021-04-23 NOTE — Discharge Summary (Addendum)
Physician Discharge Summary  Mark Carr UXL:[id number] DOB: Dec 08, 1955 DOA: 04/19/2021  PCP: Virginia Crews, MD  Admit date: 04/19/2021 Discharge date: 04/23/2021  Admitted From: home  Disposition:  home   Recommendations for Outpatient Follow-up:  Follow up with PCP in 1-2 weeks   Home Health:no  Equipment/Devices:  Discharge Condition:stable  CODE STATUS: full  Diet recommendation: regular   Brief/Interim Summary: HPI was taken from Dr. Francine Graven: Mark Carr is a 65 y.o. male with medical history significant for nicotine dependence, liver cirrhosis who was recently discharged from the hospital after treatment for Salmonella gastroenteritis/AKI and dehydration and a fall presents today via EMS for evaluation of fever and weakness.  Patient was found to have a T-max of 101F.  He complains of feeling dizzy and fell 1 day prior to his admission landing on his knees.  He denies any loss of consciousness or hitting his head.  He complains of poor oral intake over the last 24 hours, myalgias and a cough productive of clear phlegm.  He denies having any sick contacts.  Patient complains of feeling very weak and had difficulty getting up after he fell which is why his family called EMS. He denies having any chest pain, no shortness of breath, no nausea, no vomiting, no diarrhea, no headache, no blurred vision, no urinary symptoms, no focal deficits, no palpitations, no diaphoresis. Labs show sodium 134, potassium 3.3, chloride 102, bicarb 25, glucose 98, BUN 8, creatinine 0.73, calcium 7.8, alkaline phosphatase 67, albumin 3.3, AST 28, ALT 18, total protein 6.9, total bilirubin 1.7, lactic acid 1.4, white count 5.3, hemoglobin 12.8, hematocrit 35.2, MCV 93.1, RDW 12.7, platelet count 103 Patient's SARS coronavirus 2 point-of-care test is positive UA shows moderate leukocyte esterase, white cell clumps Chest x-ray reviewed by me shows right middle lobe pneumonia Twelve-lead EKG reviewed  by me shows sinus rhythm       ED Course: Patient is a 65 year old African-American male recently discharged from the hospital after treatment for Salmonella gastroenteritis, dehydration and hyponatremia.  He presents to the ER for evaluation of weakness, dizziness and a fall at home.  He was noted to have a fever with a T-max of 101F by EMS. His SARS coronavirus 2 point-of-care test is positive and chest x-ray shows a right middle lobe pneumonia.  Patient is not hypoxic on room air pulse oximetry at rest is 100%. He will be admitted to the hospital for further evaluation.  Hospital course from Dr. Jimmye Norman 04/23/21: Pt was found to have COVID19 pneumonia and was treated w/ IV remdesivir, bronchodilators, and incentive spirometry. There was also a concern for superimposed bacterial infection w/ staph lugdunensis bacterermia. Pt was treated w/ a couple of day of IV cefazolin as per ID. Repeat blood cxs showed NGTD.   Discharge Diagnoses:  Principal Problem:   Pneumonia due to COVID-19 virus Active Problems:   Tobacco use disorder   Cirrhosis of liver (HCC)   Hypokalemia   Coag negative Staphylococcus bacteremia  COVID-19 pneumonia, suspected superimposed bacterial infection: Staph lugdunensis bacteremia: 1 out of 4 bottles showed staph lugdunensis.  Unclear whether this is a contaminant or not.  Continue IV remdesivir.  Repeat blood cxs NGTD. No evidence of vegetations or endocarditis  on echo. Completed IV cefazolin on 04/23/2021.     Abnormal urinalysis: Asymptomatic.  No growth on urine culture.   Hypomagnesemia: magnesium sulfate given.    Liver cirrhosis: not on rifaximin, lactulose or diuretics. Continue w/ supportive care   Chronic  thrombocytopenia: likely secondary to cirrhosis    Tobacco use disorder: smoking cessation counseling   History of alcohol use disorder: he quit drinking alcohol about a month prior to admission.  Severe malnutrition: continue w/ nutritional  supplements   Discharge Instructions  Discharge Instructions     Diet general   Complete by: As directed    Discharge instructions   Complete by: As directed    F/u w/ PCP in 1-2 weeks   Increase activity slowly   Complete by: As directed       Allergies as of 04/23/2021   No Known Allergies      Medication List     TAKE these medications    folic acid 1 MG tablet Commonly known as: FOLVITE Take 1 tablet (1 mg total) by mouth daily.   multivitamin with minerals Tabs tablet Take 1 tablet by mouth daily.   naltrexone 50 MG tablet Commonly known as: DEPADE Take 1 tablet (50 mg total) by mouth daily.   thiamine 100 MG tablet Take 1 tablet (100 mg total) by mouth daily.        No Known Allergies  Consultations: ID   Procedures/Studies: DG Chest 2 View  Result Date: 04/19/2021 CLINICAL DATA:  Suspected sepsis.  Fever EXAM: CHEST - 2 VIEW COMPARISON:  10/28/2018 FINDINGS: Right middle lobe pneumonia. No edema, effusion, or pneumothorax. Normal heart size and mediastinal contours. Artifact from EKG leads. IMPRESSION: Right middle lobe pneumonia. Electronically Signed   By: Monte Fantasia M.D.   On: 04/19/2021 06:39   CT Head Wo Contrast  Result Date: 04/01/2021 CLINICAL DATA:  Recent fall with altered mental status, initial encounter EXAM: CT HEAD WITHOUT CONTRAST TECHNIQUE: Contiguous axial images were obtained from the base of the skull through the vertex without intravenous contrast. COMPARISON:  08/31/2007 FINDINGS: Brain: No evidence of acute infarction, hemorrhage, hydrocephalus, extra-axial collection or mass lesion/mass effect. Small lipomas are noted along the tentorium. Vascular: No hyperdense vessel or unexpected calcification. Skull: Normal. Negative for fracture or focal lesion. Sinuses/Orbits: No acute finding. Other: None. IMPRESSION: No acute intracranial abnormality noted. Electronically Signed   By: Inez Catalina M.D.   On: 04/01/2021 09:23   US  RENAL  Result Date: 04/01/2021 CLINICAL DATA:  Acute kidney injury EXAM: RENAL / URINARY TRACT ULTRASOUND COMPLETE COMPARISON:  10/28/2018 CT without contrast FINDINGS: Right Kidney: Renal measurements: 11.6 x 5.6 x 4.0 cm = volume: 135 mL. Increased cortical echogenicity. Prominent medullary pyramids. No hydronephrosis. No focal abnormality. Left Kidney: Renal measurements: 12.8 x 5.8 x 5.7 cm = volume: 222 mL. Slight increased echogenicity. No focal abnormality, acute finding or hydronephrosis. Bladder: Appears normal for degree of bladder distention. Other: No free fluid or ascites IMPRESSION: Slight increased renal echogenicity compatible with medical renal disease. No acute finding or hydronephrosis. Electronically Signed   By: Jerilynn Mages.  Shick M.D.   On: 04/01/2021 12:09   ECHOCARDIOGRAM COMPLETE  Result Date: 04/21/2021    ECHOCARDIOGRAM REPORT   Patient Name:   Mark Carr Date of Exam: 04/21/2021 Medical Rec #:  [id number]      Height:       68.0 in Accession #:    [id number]     Weight:       120.0 lb Date of Birth:  [birth year]      BSA:          1.645 m Patient Age:    64 years       BP:  110/78 mmHg Patient Gender: M              HR:           75 bpm. Exam Location:  [facility] Procedure: Cardiac Doppler and Color Doppler Indications:     bacteremia R78.81  History:         Patient has no prior history of Echocardiogram examinations. No                  prior cardiac history listed in chart.  Sonographer:     Sherrie Sport RDCS (AE) Referring Phys:  [place] Diagnosing Phys: Bartholome Bill MD  Sonographer Comments: No apical window and suboptimal subcostal window. Image acquisition challenging due to patient body habitus. IMPRESSIONS  1. Left ventricular ejection fraction, by estimation, is 55 to 60%. The left ventricle has normal function. The left ventricle has no regional wall motion abnormalities. There is moderate left ventricular hypertrophy. Left ventricular diastolic parameters are  consistent with Grade I diastolic dysfunction (impaired relaxation).  2. Right ventricular systolic function is normal. The right ventricular size is normal.  3. The mitral valve is grossly normal. Trivial mitral valve regurgitation.  4. The aortic valve is tricuspid. Aortic valve regurgitation is trivial. FINDINGS  Left Ventricle: Left ventricular ejection fraction, by estimation, is 55 to 60%. The left ventricle has normal function. The left ventricle has no regional wall motion abnormalities. The left ventricular internal cavity size was normal in size. There is  moderate left ventricular hypertrophy. Left ventricular diastolic parameters are consistent with Grade I diastolic dysfunction (impaired relaxation). Right Ventricle: The right ventricular size is normal. No increase in right ventricular wall thickness. Right ventricular systolic function is normal. Left Atrium: Left atrial size was normal in size. Right Atrium: Right atrial size was normal in size. Pericardium: There is no evidence of pericardial effusion. Mitral Valve: The mitral valve is grossly normal. Trivial mitral valve regurgitation. Tricuspid Valve: The tricuspid valve is grossly normal. Tricuspid valve regurgitation is trivial. Aortic Valve: The aortic valve is tricuspid. Aortic valve regurgitation is trivial. Pulmonic Valve: The pulmonic valve was not well visualized. Pulmonic valve regurgitation is trivial. Aorta: The aortic root is normal in size and structure. IAS/Shunts: The atrial septum is grossly normal.  LEFT VENTRICLE PLAX 2D LVIDd:         4.27 cm LVIDs:         3.03 cm LV PW:         1.20 cm LV IVS:        1.07 cm LVOT diam:     2.00 cm LVOT Area:     3.14 cm  LEFT ATRIUM         Index LA diam:    3.40 cm 2.07 cm/m                        PULMONIC VALVE AORTA                 PV Vmax:        0.66 m/s Ao Root diam: 3.63 cm PV Peak grad:   1.8 mmHg                       RVOT Peak grad: 2 mmHg   SHUNTS Systemic Diam: 2.00 cm Bartholome Bill MD Electronically signed by Bartholome Bill MD Signature Date/Time: 04/21/2021/4:46:47 PM    Final       Subjective:  Pt c/o fatigue    Discharge Exam: Vitals:   04/23/21 0520 04/23/21 0834  BP: 116/80 126/78  Pulse: 66 72  Resp: 18 16  Temp: 98.1 F (36.7 C) 97.6 F (36.4 C)  SpO2: 100% 100%   Vitals:   04/22/21 2102 04/22/21 2339 04/23/21 0520 04/23/21 0834  BP: (!) 137/93 120/80 116/80 126/78  Pulse: 70 75 66 72  Resp: 18 15 18 16   Temp: 98.4 F (36.9 C) 98 F (36.7 C) 98.1 F (36.7 C) 97.6 F (36.4 C)  TempSrc: Oral Oral Oral Oral  SpO2: 100% 98% 100% 100%  Weight:      Height:        General: Pt is alert, awake, not in acute distress Cardiovascular: S1/S2 +, no rubs, no gallops Respiratory: decreased breath sounds b/l  Abdominal: Soft, NT, bowel sounds + Extremities: no cyanosis    The results of significant diagnostics from this hospitalization (including imaging, microbiology, ancillary and laboratory) are listed below for reference.     Microbiology: Recent Results (from the past 240 hour(s))  Resp Panel by RT-PCR (Flu A&B, Covid) Nasopharyngeal Swab     Status: Abnormal   Collection Time: 04/19/21  6:04 AM   Specimen: Nasopharyngeal Swab; Nasopharyngeal(NP) swabs in vial transport medium  Result Value Ref Range Status   SARS Coronavirus 2 by RT PCR POSITIVE (A) NEGATIVE Final    Comment: RESULT CALLED TO, READ BACK BY AND VERIFIED WITH: DELSHAW GIVENS ON 04/19/21 AT 0706 QSD (NOTE) SARS-CoV-2 target nucleic acids are DETECTED.  The SARS-CoV-2 RNA is generally detectable in upper respiratory specimens during the acute phase of infection. Positive results are indicative of the presence of the identified virus, but do not rule out bacterial infection or co-infection with other pathogens not detected by the test. Clinical correlation with patient history and other diagnostic information is necessary to determine patient infection status. The expected  result is Negative.  Fact Sheet for Patients: EntrepreneurPulse.com.au  Fact Sheet for Healthcare Providers: IncredibleEmployment.be  This test is not yet approved or cleared by the [place] FDA and  has been authorized for detection and/or diagnosis of SARS-CoV-2 by FDA under an Emergency Use Authorization (EUA).  This EUA will remain in effect (meaning this test ca n be used) for the duration of  the COVID-19 declaration under Section 564(b)(1) of the Act, 21 U.S.C. section 360bbb-3(b)(1), unless the authorization is terminated or revoked sooner.     Influenza A by PCR NEGATIVE NEGATIVE Final   Influenza B by PCR NEGATIVE NEGATIVE Final    Comment: (NOTE) The Xpert Xpress SARS-CoV-2/FLU/RSV plus assay is intended as an aid in the diagnosis of influenza from Nasopharyngeal swab specimens and should not be used as a sole basis for treatment. Nasal washings and aspirates are unacceptable for Xpert Xpress SARS-CoV-2/FLU/RSV testing.  Fact Sheet for Patients: EntrepreneurPulse.com.au  Fact Sheet for Healthcare Providers: IncredibleEmployment.be  This test is not yet approved or cleared by the [place] FDA and has been authorized for detection and/or diagnosis of SARS-CoV-2 by FDA under an Emergency Use Authorization (EUA). This EUA will remain in effect (meaning this test can be used) for the duration of the COVID-19 declaration under Section 564(b)(1) of the Act, 21 U.S.C. section 360bbb-3(b)(1), unless the authorization is terminated or revoked.  Performed at [facility], [address] [place], [place] [address]   Culture, blood (Routine x 2)     Status: None (Preliminary result)   Collection Time: 04/19/21  6:04 AM  Specimen: BLOOD  Result Value Ref Range Status   Specimen Description BLOOD LEFT ANTECUBITAL  Final   Special Requests   Final    BOTTLES DRAWN AEROBIC AND  ANAEROBIC Blood Culture results may not be optimal due to an excessive volume of blood received in culture bottles   Culture   Final    NO GROWTH 4 DAYS Performed at [facility], [address] [place], [place] [address]    Report Status PENDING  Incomplete  Culture, blood (Routine x 2)     Status: Abnormal   Collection Time: 04/19/21  6:04 AM   Specimen: BLOOD  Result Value Ref Range Status   Specimen Description   Final    BLOOD BLOOD RIGHT FOREARM Performed at [facility], [address], [place], [place] [address]    Special Requests   Final    BOTTLES DRAWN AEROBIC AND ANAEROBIC Blood Culture adequate volume Performed at [facility], [place], [place] [address]    Culture  Setup Time   Final    GRAM POSITIVE COCCI ANAEROBIC BOTTLE ONLY CRITICAL RESULT CALLED TO, READ BACK BY AND VERIFIED WITH: JASON ROBINS @0233  ON 06.12.22.LFD    Culture STAPHYLOCOCCUS LUGDUNENSIS (A)  Final   Report Status 04/22/2021 FINAL  Final   Organism ID, Bacteria STAPHYLOCOCCUS LUGDUNENSIS  Final      Susceptibility   Staphylococcus lugdunensis - MIC*    CIPROFLOXACIN <=0.5 SENSITIVE Sensitive     ERYTHROMYCIN <=0.25 SENSITIVE Sensitive     GENTAMICIN <=0.5 SENSITIVE Sensitive     OXACILLIN 2 SENSITIVE Sensitive     TETRACYCLINE <=1 SENSITIVE Sensitive     VANCOMYCIN <=0.5 SENSITIVE Sensitive     TRIMETH/SULFA <=10 SENSITIVE Sensitive     CLINDAMYCIN <=0.25 SENSITIVE Sensitive     RIFAMPIN <=0.5 SENSITIVE Sensitive     Inducible Clindamycin NEGATIVE Sensitive     * STAPHYLOCOCCUS LUGDUNENSIS  Urine Culture     Status: None   Collection Time: 04/19/21  6:04 AM   Specimen: Urine, Random  Result Value Ref Range Status   Specimen Description   Final    URINE, RANDOM Performed at [facility], [address] [place], [place] [address]    Special Requests   Final    NONE Performed at [facility], [address],  [place], [place] [address]    Culture   Final    NO GROWTH Performed at [facility], [place] [address], [place], [place] [address]    Report Status 04/20/2021 FINAL  Final  Blood Culture ID Panel (Reflexed)     Status: Abnormal   Collection Time: 04/19/21  6:04 AM  Result Value Ref Range Status   Enterococcus faecalis NOT DETECTED NOT DETECTED Final   Enterococcus Faecium NOT DETECTED NOT DETECTED Final   Listeria monocytogenes NOT DETECTED NOT DETECTED Final   Staphylococcus species DETECTED (A) NOT DETECTED Final    Comment: CRITICAL RESULT CALLED TO, READ BACK BY AND VERIFIED WITH: JASON ROBINS @0233  04/21/2021 LFD    Staphylococcus aureus (BCID) NOT DETECTED NOT DETECTED Final   Staphylococcus epidermidis NOT DETECTED NOT DETECTED Final   Staphylococcus lugdunensis DETECTED (A) NOT DETECTED Final    Comment: CRITICAL RESULT CALLED TO, READ BACK BY AND VERIFIED WITH: JASON ROBINS @0233  04/20/2021 LFD    Streptococcus species NOT DETECTED NOT DETECTED Final   Streptococcus agalactiae NOT DETECTED NOT DETECTED Final   Streptococcus pneumoniae NOT DETECTED NOT DETECTED Final  Streptococcus pyogenes NOT DETECTED NOT DETECTED Final   A.calcoaceticus-baumannii NOT DETECTED NOT DETECTED Final   Bacteroides fragilis NOT DETECTED NOT DETECTED Final   Enterobacterales NOT DETECTED NOT DETECTED Final   Enterobacter cloacae complex NOT DETECTED NOT DETECTED Final   Escherichia coli NOT DETECTED NOT DETECTED Final   Klebsiella aerogenes NOT DETECTED NOT DETECTED Final   Klebsiella oxytoca NOT DETECTED NOT DETECTED Final   Klebsiella pneumoniae NOT DETECTED NOT DETECTED Final   Proteus species NOT DETECTED NOT DETECTED Final   Salmonella species NOT DETECTED NOT DETECTED Final   Serratia marcescens NOT DETECTED NOT DETECTED Final   Haemophilus influenzae NOT DETECTED NOT DETECTED Final   Neisseria meningitidis NOT DETECTED NOT DETECTED Final   Pseudomonas aeruginosa NOT DETECTED NOT  DETECTED Final   Stenotrophomonas maltophilia NOT DETECTED NOT DETECTED Final   Candida albicans NOT DETECTED NOT DETECTED Final   Candida auris NOT DETECTED NOT DETECTED Final   Candida glabrata NOT DETECTED NOT DETECTED Final   Candida krusei NOT DETECTED NOT DETECTED Final   Candida parapsilosis NOT DETECTED NOT DETECTED Final   Candida tropicalis NOT DETECTED NOT DETECTED Final   Cryptococcus neoformans/gattii NOT DETECTED NOT DETECTED Final   Methicillin resistance mecA/C NOT DETECTED NOT DETECTED Final    Comment: Performed at [facility], [address] [place], [place] [address]  CULTURE, BLOOD (ROUTINE X 2) w Reflex to ID Panel     Status: None (Preliminary result)   Collection Time: 04/21/21  9:34 AM   Specimen: BLOOD  Result Value Ref Range Status   Specimen Description BLOOD RIGHT HAND  Final   Special Requests   Final    BOTTLES DRAWN AEROBIC AND ANAEROBIC Blood Culture adequate volume   Culture   Final    NO GROWTH 2 DAYS Performed at [facility], [address] [place], [place] [address]    Report Status PENDING  Incomplete  CULTURE, BLOOD (ROUTINE X 2) w Reflex to ID Panel     Status: None (Preliminary result)   Collection Time: 04/21/21  9:34 AM   Specimen: BLOOD  Result Value Ref Range Status   Specimen Description BLOOD RIGHT [facility]  Final   Special Requests   Final    BOTTLES DRAWN AEROBIC AND ANAEROBIC Blood Culture adequate volume   Culture   Final    NO GROWTH 2 DAYS Performed at [facility], [address] [place], [place] [address]    Report Status PENDING  Incomplete     Labs: BNP (last 3 results) No results for input(s): BNP in the last 8760 hours. Basic Metabolic Panel: Recent Labs  Lab 04/19/21 0604 04/20/21 0637 04/21/21 0557 04/22/21 0539 04/23/21 0520  NA 134* 137 138 138 138  K 3.3* 3.8 3.7 3.9 3.6  CL 102 108 109 106 103  CO2 25 25 24 27 26   GLUCOSE 98 82 93 105* 98  BUN 8 9 10 11 8   CREATININE  0.73 0.66 0.76 0.62 0.60*  CALCIUM 7.8* 8.3* 8.4* 8.6* 8.9  MG  --  1.7 1.6* 1.7 1.6*  PHOS  --  2.6 3.3 3.3 3.5   Liver Function Tests: Recent Labs  Lab 04/19/21 0604 04/20/21 0637 04/21/21 0557 04/22/21 0539 04/23/21 0520  AST 28 21 23 23 22   ALT 18 16 17 14 12   ALKPHOS 67 59 59 62 62  BILITOT 1.7* 1.0 0.8 0.8 0.6  PROT 6.9 6.5 6.7 6.6 6.5  ALBUMIN 3.3* 2.9* 3.0* 3.2* 3.0*  No results for input(s): LIPASE, AMYLASE in the last 168 hours. No results for input(s): AMMONIA in the last 168 hours. CBC: Recent Labs  Lab 04/19/21 0604 04/20/21 0637 04/21/21 0557 04/22/21 0539 04/23/21 0520  WBC 5.3 4.2 3.5* 3.6* 3.7*  NEUTROABS 3.7 2.8 1.9 1.9 1.6*  HGB 12.8* 12.4* 12.2* 11.5* 12.1*  HCT 35.2* 34.6* 34.0* 32.1* 33.2*  MCV 93.1 93.8 91.4 92.5 90.7  PLT 103* 119* 136* 142* 172   Cardiac Enzymes: No results for input(s): CKTOTAL, CKMB, CKMBINDEX, TROPONINI in the last 168 hours. BNP: Invalid input(s): POCBNP CBG: Recent Labs  Lab 04/22/21 0817  GLUCAP 110*   D-Dimer Recent Labs    04/22/21 0539 04/23/21 0520  DDIMER 0.43 0.38   Hgb A1c No results for input(s): HGBA1C in the last 72 hours. Lipid Profile No results for input(s): CHOL, HDL, LDLCALC, TRIG, CHOLHDL, LDLDIRECT in the last 72 hours. Thyroid function studies No results for input(s): TSH, T4TOTAL, T3FREE, THYROIDAB in the last 72 hours.  Invalid input(s): FREET3 Anemia work up Recent Labs    04/22/21 0539 04/23/21 0520  FERRITIN 402* 412*   Urinalysis    Component Value Date/Time   COLORURINE YELLOW (A) 04/19/2021 0604   APPEARANCEUR CLEAR (A) 04/19/2021 0604   LABSPEC 1.010 04/19/2021 0604   PHURINE 7.0 04/19/2021 0604   GLUCOSEU NEGATIVE 04/19/2021 0604   HGBUR SMALL (A) 04/19/2021 0604   BILIRUBINUR NEGATIVE 04/19/2021 0604   KETONESUR NEGATIVE 04/19/2021 0604   PROTEINUR NEGATIVE 04/19/2021 0604   NITRITE NEGATIVE 04/19/2021 0604   LEUKOCYTESUR MODERATE (A) 04/19/2021 0604    Sepsis Labs Invalid input(s): PROCALCITONIN,  WBC,  LACTICIDVEN Microbiology Recent Results (from the past 240 hour(s))  Resp Panel by RT-PCR (Flu A&B, Covid) Nasopharyngeal Swab     Status: Abnormal   Collection Time: 04/19/21  6:04 AM   Specimen: Nasopharyngeal Swab; Nasopharyngeal(NP) swabs in vial transport medium  Result Value Ref Range Status   SARS Coronavirus 2 by RT PCR POSITIVE (A) NEGATIVE Final    Comment: RESULT CALLED TO, READ BACK BY AND VERIFIED WITH: DELSHAW GIVENS ON 04/19/21 AT 0706 QSD (NOTE) SARS-CoV-2 target nucleic acids are DETECTED.  The SARS-CoV-2 RNA is generally detectable in upper respiratory specimens during the acute phase of infection. Positive results are indicative of the presence of the identified virus, but do not rule out bacterial infection or co-infection with other pathogens not detected by the test. Clinical correlation with patient history and other diagnostic information is necessary to determine patient infection status. The expected result is Negative.  Fact Sheet for Patients: EntrepreneurPulse.com.au  Fact Sheet for Healthcare Providers: IncredibleEmployment.be  This test is not yet approved or cleared by the [place] FDA and  has been authorized for detection and/or diagnosis of SARS-CoV-2 by FDA under an Emergency Use Authorization (EUA).  This EUA will remain in effect (meaning this test ca n be used) for the duration of  the COVID-19 declaration under Section 564(b)(1) of the Act, 21 U.S.C. section 360bbb-3(b)(1), unless the authorization is terminated or revoked sooner.     Influenza A by PCR NEGATIVE NEGATIVE Final   Influenza B by PCR NEGATIVE NEGATIVE Final    Comment: (NOTE) The Xpert Xpress SARS-CoV-2/FLU/RSV plus assay is intended as an aid in the diagnosis of influenza from Nasopharyngeal swab specimens and should not be used as a sole basis for treatment. Nasal washings  and aspirates are unacceptable for Xpert Xpress SARS-CoV-2/FLU/RSV testing.  Fact Sheet for Patients: EntrepreneurPulse.com.au  Fact Sheet  for Healthcare Providers: IncredibleEmployment.be  This test is not yet approved or cleared by the [place] and has been authorized for detection and/or diagnosis of SARS-CoV-2 by FDA under an Emergency Use Authorization (EUA). This EUA will remain in effect (meaning this test can be used) for the duration of the COVID-19 declaration under Section 564(b)(1) of the Act, 21 U.S.C. section 360bbb-3(b)(1), unless the authorization is terminated or revoked.  Performed at [facility], [address] [place], [place] [address]   Culture, blood (Routine x 2)     Status: None (Preliminary result)   Collection Time: 04/19/21  6:04 AM   Specimen: BLOOD  Result Value Ref Range Status   Specimen Description BLOOD LEFT ANTECUBITAL  Final   Special Requests   Final    BOTTLES DRAWN AEROBIC AND ANAEROBIC Blood Culture results may not be optimal due to an excessive volume of blood received in culture bottles   Culture   Final    NO GROWTH 4 DAYS Performed at [facility], [address], [place], [place] [address]    Report Status PENDING  Incomplete  Culture, blood (Routine x 2)     Status: Abnormal   Collection Time: 04/19/21  6:04 AM   Specimen: BLOOD  Result Value Ref Range Status   Specimen Description   Final    BLOOD BLOOD RIGHT FOREARM Performed at [facility], [address], [place], [place] [address]    Special Requests   Final    BOTTLES DRAWN AEROBIC AND ANAEROBIC Blood Culture adequate volume Performed at [facility], [address] [place], [place] [address]    Culture  Setup Time   Final    GRAM POSITIVE COCCI ANAEROBIC BOTTLE ONLY CRITICAL RESULT CALLED TO, READ BACK BY AND VERIFIED WITH: JASON ROBINS @0233  ON 06.12.22.LFD    Culture  STAPHYLOCOCCUS LUGDUNENSIS (A)  Final   Report Status 04/22/2021 FINAL  Final   Organism ID, Bacteria STAPHYLOCOCCUS LUGDUNENSIS  Final      Susceptibility   Staphylococcus lugdunensis - MIC*    CIPROFLOXACIN <=0.5 SENSITIVE Sensitive     ERYTHROMYCIN <=0.25 SENSITIVE Sensitive     GENTAMICIN <=0.5 SENSITIVE Sensitive     OXACILLIN 2 SENSITIVE Sensitive     TETRACYCLINE <=1 SENSITIVE Sensitive     VANCOMYCIN <=0.5 SENSITIVE Sensitive     TRIMETH/SULFA <=10 SENSITIVE Sensitive     CLINDAMYCIN <=0.25 SENSITIVE Sensitive     RIFAMPIN <=0.5 SENSITIVE Sensitive     Inducible Clindamycin NEGATIVE Sensitive     * STAPHYLOCOCCUS LUGDUNENSIS  Urine Culture     Status: None   Collection Time: 04/19/21  6:04 AM   Specimen: Urine, Random  Result Value Ref Range Status   Specimen Description   Final    URINE, RANDOM Performed at [facility], [address] [place], [place] [address]    Special Requests   Final    NONE Performed at [facility], [address] [place], [place] [address]    Culture   Final    NO GROWTH Performed at [facility], [place] [address] [place],  [address]    Report Status 04/20/2021 FINAL  Final  Blood Culture ID Panel (Reflexed)     Status: Abnormal   Collection Time: 04/19/21  6:04 AM  Result Value Ref Range Status   Enterococcus faecalis NOT DETECTED NOT DETECTED Final   Enterococcus Faecium NOT DETECTED NOT DETECTED Final   Listeria monocytogenes NOT DETECTED  NOT DETECTED Final   Staphylococcus species DETECTED (A) NOT DETECTED Final    Comment: CRITICAL RESULT CALLED TO, READ BACK BY AND VERIFIED WITH: JASON ROBINS @0233  04/21/2021 LFD    Staphylococcus aureus (BCID) NOT DETECTED NOT DETECTED Final   Staphylococcus epidermidis NOT DETECTED NOT DETECTED Final   Staphylococcus lugdunensis DETECTED (A) NOT DETECTED Final    Comment: CRITICAL RESULT CALLED TO, READ BACK BY AND VERIFIED WITH: JASON ROBINS @0233  04/20/2021  LFD    Streptococcus species NOT DETECTED NOT DETECTED Final   Streptococcus agalactiae NOT DETECTED NOT DETECTED Final   Streptococcus pneumoniae NOT DETECTED NOT DETECTED Final   Streptococcus pyogenes NOT DETECTED NOT DETECTED Final   A.calcoaceticus-baumannii NOT DETECTED NOT DETECTED Final   Bacteroides fragilis NOT DETECTED NOT DETECTED Final   Enterobacterales NOT DETECTED NOT DETECTED Final   Enterobacter cloacae complex NOT DETECTED NOT DETECTED Final   Escherichia coli NOT DETECTED NOT DETECTED Final   Klebsiella aerogenes NOT DETECTED NOT DETECTED Final   Klebsiella oxytoca NOT DETECTED NOT DETECTED Final   Klebsiella pneumoniae NOT DETECTED NOT DETECTED Final   Proteus species NOT DETECTED NOT DETECTED Final   Salmonella species NOT DETECTED NOT DETECTED Final   Serratia marcescens NOT DETECTED NOT DETECTED Final   Haemophilus influenzae NOT DETECTED NOT DETECTED Final   Neisseria meningitidis NOT DETECTED NOT DETECTED Final   Pseudomonas aeruginosa NOT DETECTED NOT DETECTED Final   Stenotrophomonas maltophilia NOT DETECTED NOT DETECTED Final   Candida albicans NOT DETECTED NOT DETECTED Final   Candida auris NOT DETECTED NOT DETECTED Final   Candida glabrata NOT DETECTED NOT DETECTED Final   Candida krusei NOT DETECTED NOT DETECTED Final   Candida parapsilosis NOT DETECTED NOT DETECTED Final   Candida tropicalis NOT DETECTED NOT DETECTED Final   Cryptococcus neoformans/gattii NOT DETECTED NOT DETECTED Final   Methicillin resistance mecA/C NOT DETECTED NOT DETECTED Final    Comment: Performed at [facility], [place], [place] [address]  CULTURE, BLOOD (ROUTINE X 2) w Reflex to ID Panel     Status: None (Preliminary result)   Collection Time: 04/21/21  9:34 AM   Specimen: BLOOD  Result Value Ref Range Status   Specimen Description BLOOD RIGHT HAND  Final   Special Requests   Final    BOTTLES DRAWN AEROBIC AND ANAEROBIC Blood Culture adequate  volume   Culture   Final    NO GROWTH 2 DAYS Performed at [facility], [address] [place], [place] [address]    Report Status PENDING  Incomplete  CULTURE, BLOOD (ROUTINE X 2) w Reflex to ID Panel     Status: None (Preliminary result)   Collection Time: 04/21/21  9:34 AM   Specimen: BLOOD  Result Value Ref Range Status   Specimen Description BLOOD RIGHT [facility]  Final   Special Requests   Final    BOTTLES DRAWN AEROBIC AND ANAEROBIC Blood Culture adequate volume   Culture   Final    NO GROWTH 2 DAYS Performed at [facility], [address], [place], [place] [address]    Report Status PENDING  Incomplete     Time coordinating discharge: Over 30 minutes  SIGNED:   Wyvonnia Dusky, MD  [facility] 04/23/2021, 11:59 AM Pager   If 7PM-7AM, please contact night-coverage [email or web address]

## 2021-04-23 NOTE — Progress Notes (Signed)
Date of Admission:  04/19/2021      ID: Mark Carr is a 65 y.o. male  Principal Problem:   Pneumonia due to COVID-19 virus Active Problems:   Tobacco use disorder   Cirrhosis of liver (HCC)   Hypokalemia   Coag negative Staphylococcus bacteremia    Subjective: Doing well No fever No sob  Medications:   albuterol  2 puff Inhalation Q6H   vitamin C  500 mg Oral Daily   enoxaparin (LOVENOX) injection  40 mg Subcutaneous Q24H   feeding supplement  237 mL Oral TID BM   folic acid  1 mg Oral Daily   multivitamin with minerals  1 tablet Oral Daily   nicotine  21 mg Transdermal Daily   sodium chloride flush  3 mL Intravenous Q12H   thiamine  100 mg Oral Daily   zinc sulfate  220 mg Oral Daily    Objective: Vital signs in last 24 hours: Temp:  [97.6 F (36.4 C)-98.4 F (36.9 C)] 97.6 F (36.4 C) (06/15 0834) Pulse Rate:  [66-79] 72 (06/15 0834) Resp:  [15-20] 16 (06/15 0834) BP: (116-137)/(76-93) 126/78 (06/15 0834) SpO2:  [98 %-100 %] 100 % (06/15 0834)  PHYSICAL EXAM:  General: Alert, cooperative, no distress, appears stated age.  Lungs: Clear to auscultation bilaterally. No Wheezing or Rhonchi. No rales. Heart: Regular rate and rhythm, no murmur, rub or gallop. Abdomen: Soft, non-tender,not distended. Bowel sounds normal. No masses Extremities: atraumatic, no cyanosis. No edema. No clubbing Skin: No rashes or lesions. Or bruising Lymph: Cervical, supraclavicular normal. Neurologic: Grossly non-focal  Lab Results Recent Labs    04/22/21 0539 04/23/21 0520  WBC 3.6* 3.7*  HGB 11.5* 12.1*  HCT 32.1* 33.2*  NA 138 138  K 3.9 3.6  CL 106 103  CO2 27 26  BUN 11 8  CREATININE 0.62 0.60*   Liver Panel Recent Labs    04/22/21 0539 04/23/21 0520  PROT 6.6 6.5  ALBUMIN 3.2* 3.0*  AST 23 22  ALT 14 12  ALKPHOS 62 62  BILITOT 0.8 0.6   Sedimentation Rate No results for input(s): ESRSEDRATE in the last 72 hours. C-Reactive Protein Recent Labs     04/22/21 0539 04/23/21 0520  CRP 1.2* 0.8    Microbiology:  Studies/Results: ECHOCARDIOGRAM COMPLETE  Result Date: 04/21/2021    ECHOCARDIOGRAM REPORT   Patient Name:   PIERS BAADE Date of Exam: 04/21/2021 Medical Rec #:  [id number]      Height:       68.0 in Accession #:    [id number]     Weight:       120.0 lb Date of Birth:  1956-07-20      BSA:          1.645 m Patient Age:    46 years       BP:           110/78 mmHg Patient Gender: M              HR:           75 bpm. Exam Location:  [facility] Procedure: Cardiac Doppler and Color Doppler Indications:     bacteremia R78.81  History:         Patient has no prior history of Echocardiogram examinations. No                  prior cardiac history listed in chart.  Sonographer:     Sherrie Sport RDCS (  AE) Referring Phys:  [place] Diagnosing Phys: Bartholome Bill MD  Sonographer Comments: No apical window and suboptimal subcostal window. Image acquisition challenging due to patient body habitus. IMPRESSIONS  1. Left ventricular ejection fraction, by estimation, is 55 to 60%. The left ventricle has normal function. The left ventricle has no regional wall motion abnormalities. There is moderate left ventricular hypertrophy. Left ventricular diastolic parameters are consistent with Grade I diastolic dysfunction (impaired relaxation).  2. Right ventricular systolic function is normal. The right ventricular size is normal.  3. The mitral valve is grossly normal. Trivial mitral valve regurgitation.  4. The aortic valve is tricuspid. Aortic valve regurgitation is trivial. FINDINGS  Left Ventricle: Left ventricular ejection fraction, by estimation, is 55 to 60%. The left ventricle has normal function. The left ventricle has no regional wall motion abnormalities. The left ventricular internal cavity size was normal in size. There is  moderate left ventricular hypertrophy. Left ventricular diastolic parameters are consistent with Grade I diastolic dysfunction  (impaired relaxation). Right Ventricle: The right ventricular size is normal. No increase in right ventricular wall thickness. Right ventricular systolic function is normal. Left Atrium: Left atrial size was normal in size. Right Atrium: Right atrial size was normal in size. Pericardium: There is no evidence of pericardial effusion. Mitral Valve: The mitral valve is grossly normal. Trivial mitral valve regurgitation. Tricuspid Valve: The tricuspid valve is grossly normal. Tricuspid valve regurgitation is trivial. Aortic Valve: The aortic valve is tricuspid. Aortic valve regurgitation is trivial. Pulmonic Valve: The pulmonic valve was not well visualized. Pulmonic valve regurgitation is trivial. Aorta: The aortic root is normal in size and structure. IAS/Shunts: The atrial septum is grossly normal.  LEFT VENTRICLE PLAX 2D LVIDd:         4.27 cm LVIDs:         3.03 cm LV PW:         1.20 cm LV IVS:        1.07 cm LVOT diam:     2.00 cm LVOT Area:     3.14 cm  LEFT ATRIUM         Index LA diam:    3.40 cm 2.07 cm/m                        PULMONIC VALVE AORTA                 PV Vmax:        0.66 m/s Ao Root diam: 3.63 cm PV Peak grad:   1.8 mmHg                       RVOT Peak grad: 2 mmHg   SHUNTS Systemic Diam: 2.00 cm Bartholome Bill MD Electronically signed by Bartholome Bill MD Signature Date/Time: 04/21/2021/4:46:47 PM    Final      Assessment/Plan: COVID-19 viral illness.  Has completed 5 days of remdesivir  Staph lugdunensis 1 out of 4 bottle likely contaminant.  Was treated with 5 days of antibiotics.  2D echo is okay with no vegetation Repeat blood cultures have been negative.  So he does not need any further antibiotics.  Cirrhosis of the liver    Recent Salmonella gastroenteritis which has been treated in the previous admission.  This is resolved.  Discussed with care team.

## 2021-04-24 LAB — CULTURE, BLOOD (ROUTINE X 2): Culture: NO GROWTH

## 2021-04-26 LAB — CULTURE, BLOOD (ROUTINE X 2)
Culture: NO GROWTH
Culture: NO GROWTH
Special Requests: ADEQUATE
Special Requests: ADEQUATE

## 2021-05-13 ENCOUNTER — Ambulatory Visit (INDEPENDENT_AMBULATORY_CARE_PROVIDER_SITE_OTHER): Payer: Self-pay | Admitting: Family Medicine

## 2021-05-13 ENCOUNTER — Other Ambulatory Visit: Payer: Self-pay

## 2021-05-13 ENCOUNTER — Encounter: Payer: Self-pay | Admitting: Family Medicine

## 2021-05-13 VITALS — BP 133/91 | HR 84 | Temp 98.5°F | Resp 15 | Wt 124.8 lb

## 2021-05-13 DIAGNOSIS — N5203 Combined arterial insufficiency and corporo-venous occlusive erectile dysfunction: Secondary | ICD-10-CM

## 2021-05-13 DIAGNOSIS — F17200 Nicotine dependence, unspecified, uncomplicated: Secondary | ICD-10-CM

## 2021-05-13 DIAGNOSIS — J1282 Pneumonia due to coronavirus disease 2019: Secondary | ICD-10-CM

## 2021-05-13 DIAGNOSIS — K7460 Unspecified cirrhosis of liver: Secondary | ICD-10-CM

## 2021-05-13 DIAGNOSIS — U071 COVID-19: Secondary | ICD-10-CM

## 2021-05-13 DIAGNOSIS — Z7289 Other problems related to lifestyle: Secondary | ICD-10-CM

## 2021-05-13 DIAGNOSIS — Z789 Other specified health status: Secondary | ICD-10-CM

## 2021-05-13 MED ORDER — TADALAFIL 20 MG PO TABS: 20.0000 mg | ORAL_TABLET | ORAL | 2 refills | Status: DC | PRN

## 2021-05-13 NOTE — Patient Instructions (Signed)
Recommended :Stop smoking and alcohol use  Managing the Challenge of Quitting Smoking Quitting smoking is a physical and mental challenge. You will face cravings, withdrawal symptoms, and temptation. Before quitting, work with your health care provider to make a plan that can help you manage quitting. Preparation canhelp you quit and keep you from giving in. How to manage lifestyle changes Managing stress Stress can make you want to smoke, and wanting to smoke may cause stress. It is important to find ways to manage your stress. You might try some of the following: Practice relaxation techniques. Breathe slowly and deeply, in through your nose and out through your mouth. Listen to music. Soak in a bath or take a shower. Imagine a peaceful place or vacation. Get some support. Talk with family or friends about your stress. Join a support group. Talk with a counselor or therapist. Get some physical activity. Go for a walk, run, or bike ride. Play a favorite sport. Practice yoga.  Medicines Talk with your health care provider about medicines that might help you dealwith cravings and make quitting easier for you. Relationships Social situations can be difficult when you are quitting smoking. To manage this, you can: Avoid parties and other social situations where people might be smoking. Avoid alcohol. Leave right away if you have the urge to smoke. Explain to your family and friends that you are quitting smoking. Ask for support and let them know you might be a bit grumpy. Plan activities where smoking is not an option. General instructions Be aware that many people gain weight after they quit smoking. However, not everyone does. To keep from gaining weight, have a plan in place before you quit and stick to the plan after you quit. Your plan should include: Having healthy snacks. When you have a craving, it may help to: Eat popcorn, carrots, celery, or other cut vegetables. Chew  sugar-free gum. Changing how you eat. Eat small portion sizes at meals. Eat 4-6 small meals throughout the day instead of 1-2 large meals a day. Be mindful when you eat. Do not watch television or do other things that might distract you as you eat. Exercising regularly. Make time to exercise each day. If you do not have time for a long workout, do short bouts of exercise for 5-10 minutes several times a day. Do some form of strengthening exercise, such as weight lifting. Do some exercise that gets your heart beating and causes you to breathe deeply, such as walking fast, running, swimming, or biking. This is very important. Drinking plenty of water or other low-calorie or no-calorie drinks. Drink 6-8 glasses of water daily.  How to recognize withdrawal symptoms Your body and mind may experience discomfort as you try to get used to not having nicotine in your system. These effects are called withdrawal symptoms. They may include: Feeling hungrier than normal. Having trouble concentrating. Feeling irritable or restless. Having trouble sleeping. Feeling depressed. Craving a cigarette. To manage withdrawal symptoms: Avoid places, people, and activities that trigger your cravings. Remember why you want to quit. Get plenty of sleep. Avoid coffee and other caffeinated drinks. These may worsen some of your symptoms. These symptoms may surprise you. But be assured that they are normal to havewhen quitting smoking. How to manage cravings Come up with a plan for how to deal with your cravings. The plan should include the following: A definition of the specific situation you want to deal with. An alternative action you will take. A clear idea  for how this action will help. The name of someone who might help you with this. Cravings usually last for 5-10 minutes. Consider taking the following actions to help you with your plan to deal with cravings: Keep your mouth busy. Chew sugar-free gum. Suck  on hard candies or a straw. Brush your teeth. Keep your hands and body busy. Change to a different activity right away. Squeeze or play with a ball. Do an activity or a hobby, such as making bead jewelry, practicing needlepoint, or working with wood. Mix up your normal routine. Take a short exercise break. Go for a quick walk or run up and down stairs. Focus on doing something kind or helpful for someone else. Call a friend or family member to talk during a craving. Join a support group. Contact a quitline. Where to find support To get help or find a support group: Call the [facility]'s Smoking Quitline: 1-800-QUIT NOW (450) 255-1173) Visit the website of the Substance Abuse and [place]: ktimeonline.com Text QUIT to SmokefreeTXT: 226333 Where to find more information Visit these websites to find more information on quitting smoking: [place]: www.smokefree.gov American Lung Association: www.lung.org American Cancer Society: www.cancer.org Centers for Disease Control and Prevention: http://www.wolf.info/ American Heart Association: www.heart.org Contact a health care provider if: You want to change your plan for quitting. The medicines you are taking are not helping. Your eating feels out of control or you cannot sleep. Get help right away if: You feel depressed or become very anxious. Summary Quitting smoking is a physical and mental challenge. You will face cravings, withdrawal symptoms, and temptation to smoke again. Preparation can help you as you go through these challenges. Try different techniques to manage stress, handle social situations, and prevent weight gain. You can deal with cravings by keeping your mouth busy (such as by chewing gum), keeping your hands and body busy, calling family or friends, or contacting a quitline for people who want to quit smoking. You can deal with withdrawal symptoms by avoiding places where  people smoke, getting plenty of rest, and avoiding drinks with caffeine. This information is not intended to replace advice given to you by your health care provider. Make sure you discuss any questions you have with your healthcare provider. Document Revised: 08/15/2019 Document Reviewed: 08/15/2019 Elsevier Patient Education  [place].

## 2021-05-13 NOTE — Progress Notes (Signed)
Established patient visit   Patient: Mark Carr   DOB: Mar 01, 1956   65 y.o. Male  MRN: [id number] Visit Date: 05/13/2021  Today's healthcare provider: Wilhemena Durie, MD   Chief Complaint  Patient presents with   Hospitalization Follow-up   Subjective    HPI  Patient comes in today for follow-up hospitalization for COVID-pneumonia along with weakness and gastroenteritis contributing to his weakness and dehydration.  He states that he feels completely back to normal and feels well. He states he drinks a beer every few weeks now and smokes 1 to 2 cigarettes/day. He does have an issue with the ED as he is recently gotten back with his wife and he has 4 children and multiple grandchildren.  He would like to try one of the drugs for ED.  He has no chest pain or shortness of breath or history of CAD. Follow up Hospitalization  Patient was admitted to [facility] on 04/19/21 and discharged on 04/23/21. He was treated for fever and weakness, Covid test performed came back positive. Discharge diagnosis: Pneumonia due to Covid-19 virus,  Treatment for this included IV remdesivir, bronchodilators, and incentive spirometry. There was also a concern for superimposed bacterial infection w/ staph lugdunensis bacterermia. Pt was treated w/ a couple of day of IV cefazolin as per ID. Repeat blood cxs showed NGTD. . Telephone follow up was done on N/A He reports good compliance with treatment. He reports this condition is improved.  ----------------------------------------------------------------------------------------- -       Medications: Outpatient Medications Prior to Visit  Medication Sig   naltrexone (DEPADE) 50 MG tablet Take 1 tablet (50 mg total) by mouth daily.   No facility-administered medications prior to visit.    Review of Systems      Objective    BP (!) 133/91   Pulse 84   Temp 98.5 F (36.9 C) (Oral)   Resp 15   Wt 124 lb 12.8 oz (56.6 kg)   SpO2 100%   BMI  18.98 kg/m  BP Readings from Last 3 Encounters:  05/13/21 (!) 133/91  04/23/21 120/79  04/03/21 117/71   Wt Readings from Last 3 Encounters:  05/13/21 124 lb 12.8 oz (56.6 kg)  04/19/21 120 lb (54.4 kg)  04/02/21 102 lb 4.7 oz (46.4 kg)       Physical Exam Vitals reviewed.  Constitutional:      General: He is not in acute distress.    Appearance: He is not diaphoretic.  HENT:     Head: Normocephalic and atraumatic.     Right Ear: External ear normal.     Left Ear: External ear normal.  Eyes:     General: No scleral icterus.    Conjunctiva/sclera: Conjunctivae normal.  Cardiovascular:     Rate and Rhythm: Normal rate and regular rhythm.     Pulses: Normal pulses.     Heart sounds: Normal heart sounds. No murmur heard. Pulmonary:     Effort: Pulmonary effort is normal. No respiratory distress.     Breath sounds: Normal breath sounds. No wheezing.  Abdominal:     General: There is no distension.     Palpations: Abdomen is soft.     Tenderness: There is no abdominal tenderness.  Musculoskeletal:     Cervical back: Neck supple.     Right lower leg: No edema.     Left lower leg: No edema.  Lymphadenopathy:     Cervical: No cervical adenopathy.  Skin:  General: Skin is warm and dry.     Findings: No rash.  Neurological:     General: No focal deficit present.     Mental Status: He is alert and oriented to person, place, and time.  Psychiatric:        Mood and Affect: Mood normal.        Behavior: Behavior normal.        Thought Content: Thought content normal.        Judgment: Judgment normal.      No results found for any visits on 05/13/21.  Assessment & Plan     1. Pneumonia due to COVID-19 virus Follow-up chest x-ray on next visit in 1 to 2 months. Clinically the patient states he feels very well.  2. Cirrhosis of liver without ascites, unspecified hepatic cirrhosis type ([place]) Patient advised to completely abstain from alcohol.  3. Tobacco use  disorder Patient advised to completely quit smoking  4. Alcohol use He is offered naltrexone which his daughter had called and asked [place] to prescribe.  He declines this.  5. Combined arterial insufficiency and corporo-venous occlusive erectile dysfunction Try tadalafil 20 mg every 3 days as needed.  #20 with 2 refills given.  He had so advised of risk.   No follow-ups on file.      I, Wilhemena Durie, MD, have reviewed all documentation for this visit. The documentation on 05/14/21 for the exam, diagnosis, procedures, and orders are all accurate and complete.    Forrestine Lecrone Cranford Mon, MD  [facility] [phone] (phone) [phone] (fax)  [place]

## 2021-06-16 ENCOUNTER — Other Ambulatory Visit: Payer: Self-pay

## 2021-06-16 ENCOUNTER — Inpatient Hospital Stay: Payer: Medicare Other

## 2021-06-16 ENCOUNTER — Emergency Department: Payer: Medicare Other

## 2021-06-16 ENCOUNTER — Inpatient Hospital Stay
Admission: EM | Admit: 2021-06-16 | Discharge: 2021-06-20 | DRG: 070 | Disposition: A | Discharge status: Home Health | Payer: Medicare Other | Attending: Internal Medicine | Admitting: Internal Medicine

## 2021-06-16 DIAGNOSIS — N179 Acute kidney failure, unspecified: Secondary | ICD-10-CM | POA: Diagnosis present

## 2021-06-16 DIAGNOSIS — Z8249 Family history of ischemic heart disease and other diseases of the circulatory system: Secondary | ICD-10-CM | POA: Diagnosis not present

## 2021-06-16 DIAGNOSIS — G936 Cerebral edema: Secondary | ICD-10-CM | POA: Diagnosis present

## 2021-06-16 DIAGNOSIS — F17210 Nicotine dependence, cigarettes, uncomplicated: Secondary | ICD-10-CM | POA: Diagnosis present

## 2021-06-16 DIAGNOSIS — F1410 Cocaine abuse, uncomplicated: Secondary | ICD-10-CM | POA: Diagnosis present

## 2021-06-16 DIAGNOSIS — I6783 Posterior reversible encephalopathy syndrome: Principal | ICD-10-CM | POA: Diagnosis present

## 2021-06-16 DIAGNOSIS — Z7289 Other problems related to lifestyle: Secondary | ICD-10-CM

## 2021-06-16 DIAGNOSIS — G9340 Encephalopathy, unspecified: Secondary | ICD-10-CM | POA: Diagnosis present

## 2021-06-16 DIAGNOSIS — E8729 Other acidosis: Secondary | ICD-10-CM | POA: Diagnosis present

## 2021-06-16 DIAGNOSIS — E512 Wernicke's encephalopathy: Secondary | ICD-10-CM | POA: Diagnosis present

## 2021-06-16 DIAGNOSIS — I10 Essential (primary) hypertension: Secondary | ICD-10-CM | POA: Diagnosis present

## 2021-06-16 DIAGNOSIS — R4182 Altered mental status, unspecified: Secondary | ICD-10-CM

## 2021-06-16 DIAGNOSIS — Z79899 Other long term (current) drug therapy: Secondary | ICD-10-CM | POA: Diagnosis not present

## 2021-06-16 DIAGNOSIS — E860 Dehydration: Secondary | ICD-10-CM | POA: Diagnosis present

## 2021-06-16 DIAGNOSIS — F1010 Alcohol abuse, uncomplicated: Secondary | ICD-10-CM | POA: Diagnosis present

## 2021-06-16 DIAGNOSIS — Y900 Blood alcohol level of less than 20 mg/100 ml: Secondary | ICD-10-CM | POA: Diagnosis present

## 2021-06-16 DIAGNOSIS — Z8616 Personal history of COVID-19: Secondary | ICD-10-CM | POA: Diagnosis not present

## 2021-06-16 DIAGNOSIS — F17200 Nicotine dependence, unspecified, uncomplicated: Secondary | ICD-10-CM | POA: Diagnosis not present

## 2021-06-16 DIAGNOSIS — F1090 Alcohol use, unspecified, uncomplicated: Secondary | ICD-10-CM

## 2021-06-16 DIAGNOSIS — K7030 Alcoholic cirrhosis of liver without ascites: Secondary | ICD-10-CM | POA: Diagnosis present

## 2021-06-16 DIAGNOSIS — E872 Acidosis, unspecified: Secondary | ICD-10-CM | POA: Diagnosis present

## 2021-06-16 DIAGNOSIS — Z789 Other specified health status: Secondary | ICD-10-CM

## 2021-06-16 HISTORY — DX: Encephalopathy, unspecified: G93.40

## 2021-06-16 LAB — CBC WITH DIFFERENTIAL/PLATELET
Abs Immature Granulocytes: 0.11 10*3/uL — ABNORMAL HIGH (ref 0.00–0.07)
Basophils Absolute: 0.1 10*3/uL (ref 0.0–0.1)
Basophils Relative: 1 %
Eosinophils Absolute: 0.2 10*3/uL (ref 0.0–0.5)
Eosinophils Relative: 3 %
HCT: 39.8 % (ref 39.0–52.0)
Hemoglobin: 13.6 g/dL (ref 13.0–17.0)
Immature Granulocytes: 2 %
Lymphocytes Relative: 49 %
Lymphs Abs: 3.7 10*3/uL (ref 0.7–4.0)
MCH: 33.2 pg (ref 26.0–34.0)
MCHC: 34.2 g/dL (ref 30.0–36.0)
MCV: 97.1 fL (ref 80.0–100.0)
Monocytes Absolute: 0.6 10*3/uL (ref 0.1–1.0)
Monocytes Relative: 8 %
Neutro Abs: 2.6 10*3/uL (ref 1.7–7.7)
Neutrophils Relative %: 37 %
Platelets: 161 10*3/uL (ref 150–400)
RBC: 4.1 MIL/uL — ABNORMAL LOW (ref 4.22–5.81)
RDW: 13.6 % (ref 11.5–15.5)
WBC: 7.2 10*3/uL (ref 4.0–10.5)
nRBC: 0 % (ref 0.0–0.2)

## 2021-06-16 LAB — COMPREHENSIVE METABOLIC PANEL
ALT: 21 U/L (ref 0–44)
AST: 42 U/L — ABNORMAL HIGH (ref 15–41)
Albumin: 4.2 g/dL (ref 3.5–5.0)
Alkaline Phosphatase: 93 U/L (ref 38–126)
Anion gap: 15 (ref 5–15)
BUN: 10 mg/dL (ref 8–23)
CO2: 16 mmol/L — ABNORMAL LOW (ref 22–32)
Calcium: 9.3 mg/dL (ref 8.9–10.3)
Chloride: 108 mmol/L (ref 98–111)
Creatinine, Ser: 0.93 mg/dL (ref 0.61–1.24)
GFR, Estimated: >60 mL/min (ref >60)
Glucose, Bld: 123 mg/dL — ABNORMAL HIGH (ref 70–99)
Potassium: 3.7 mmol/L (ref 3.5–5.1)
Sodium: 139 mmol/L (ref 135–145)
Total Bilirubin: 0.8 mg/dL (ref 0.3–1.2)
Total Protein: 8.2 g/dL — ABNORMAL HIGH (ref 6.5–8.1)

## 2021-06-16 LAB — PROTIME-INR
INR: 1.2 (ref 0.8–1.2)
Prothrombin Time: 15.1 seconds (ref 11.4–15.2)

## 2021-06-16 LAB — URINE DRUG SCREEN, QUALITATIVE (ARMC ONLY)
Amphetamines, Ur Screen: NOT DETECTED
Barbiturates, Ur Screen: NOT DETECTED
Benzodiazepine, Ur Scrn: POSITIVE — AB
Cannabinoid 50 Ng, Ur ~~LOC~~: NOT DETECTED
Cocaine Metabolite,Ur ~~LOC~~: NOT DETECTED
MDMA (Ecstasy)Ur Screen: NOT DETECTED
Methadone Scn, Ur: NOT DETECTED
Opiate, Ur Screen: NOT DETECTED
Phencyclidine (PCP) Ur S: NOT DETECTED
Tricyclic, Ur Screen: NOT DETECTED

## 2021-06-16 LAB — ETHANOL
Alcohol, Ethyl (B): <10 mg/dL (ref <10)
Alcohol, Ethyl (B): <10 mg/dL (ref <10)

## 2021-06-16 LAB — MAGNESIUM: Magnesium: 2.3 mg/dL (ref 1.7–2.4)

## 2021-06-16 LAB — LACTIC ACID, PLASMA: Lactic Acid, Venous: 1.9 mmol/L (ref 0.5–1.9)

## 2021-06-16 LAB — CK: Total CK: 112 U/L (ref 49–397)

## 2021-06-16 LAB — TSH: TSH: 3.088 u[IU]/mL (ref 0.350–4.500)

## 2021-06-16 IMAGING — DX DG CHEST 1V PORT
1 series · 1 of 1 positions shown · non-contrast
Comparison: The [DATE]

CLINICAL DATA: Acute encephalopathy

EXAM:
PORTABLE CHEST 1 VIEW

[chest ap]
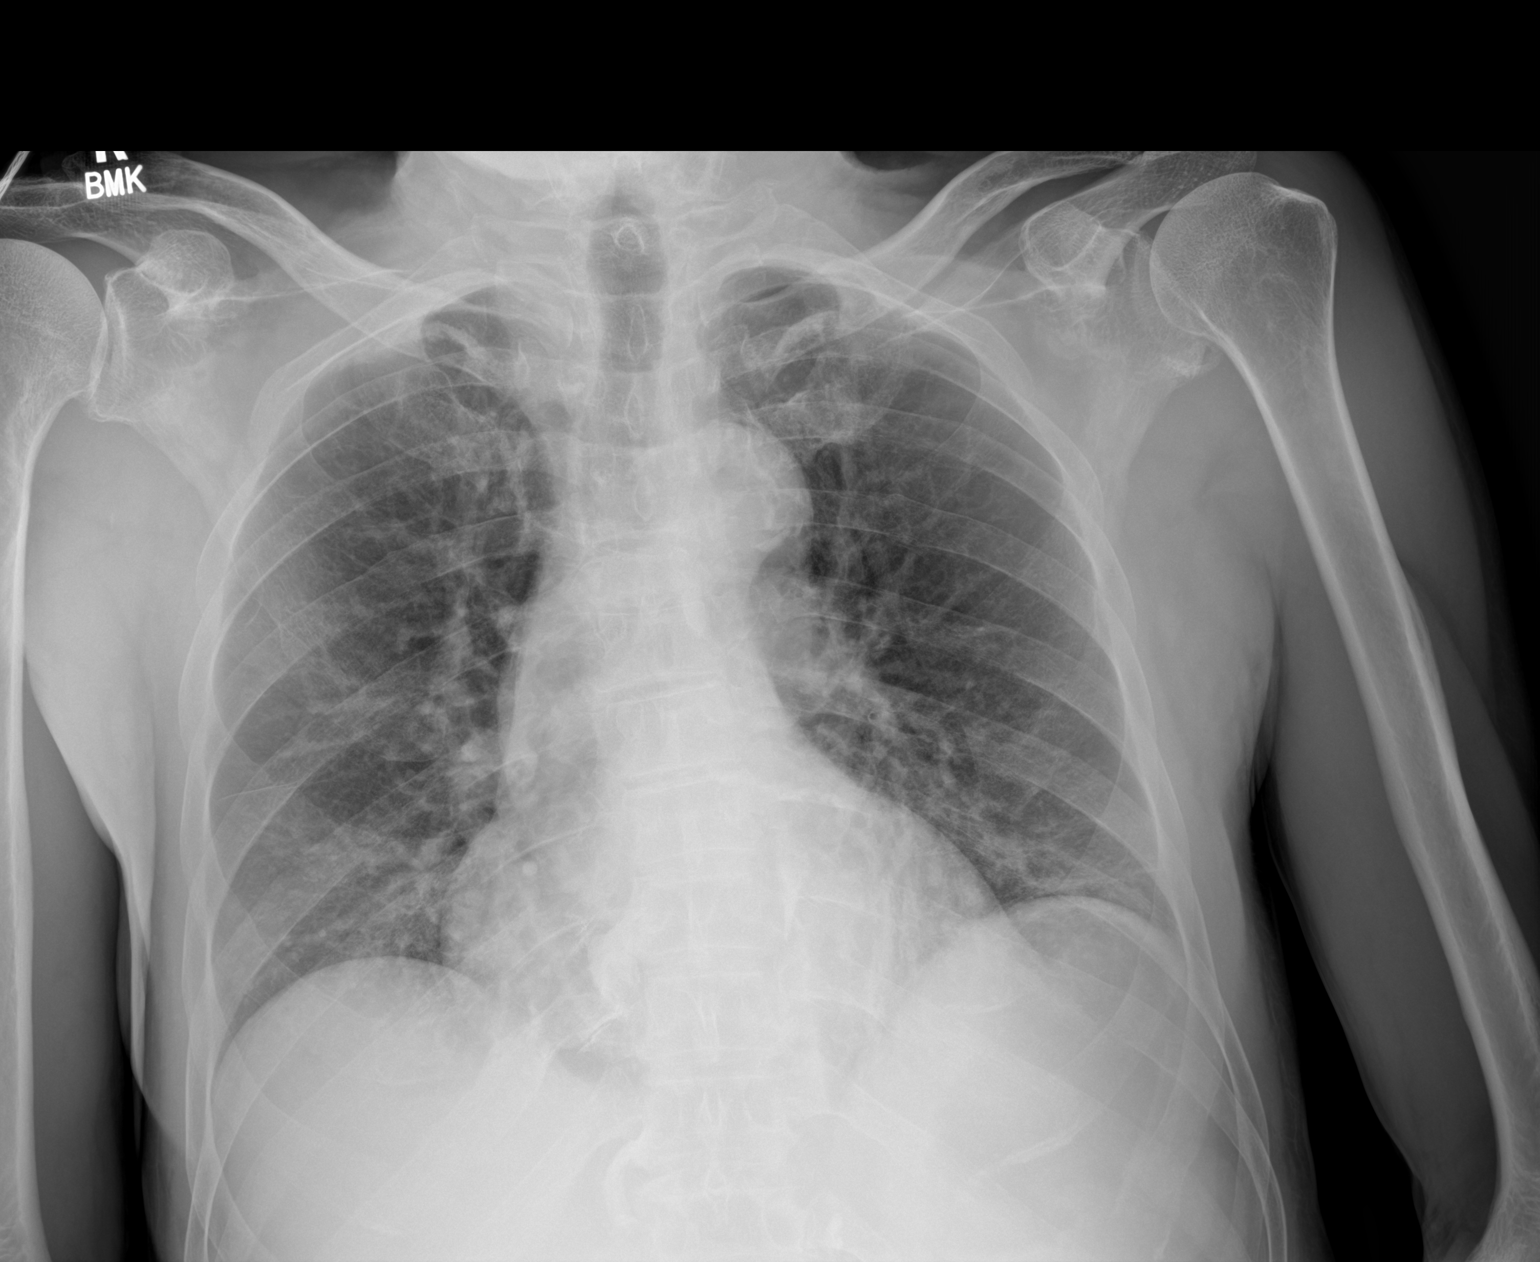

[1 of 1 positions shown; findings below may reference images not displayed]

FINDINGS: Left base atelectasis. Right lung clear. No effusions. Heart is
borderline in size. No acute bony abnormality.
IMPRESSION: Left base atelectasis.

## 2021-06-16 IMAGING — CT CT HEAD W/O CM
3 of 4 series · 15 of 47 positions shown, 18 images · non-contrast
Comparison: [DATE]

CLINICAL DATA: Mental status change, unknown cause

EXAM:
CT HEAD WITHOUT CONTRAST
TECHNIQUE: Contiguous axial images were obtained from the base of the skull
through the vertex without intravenous contrast.

[Series 5: head wo · axial · 0.43mm/px · z∈[+593,+723]mm · 9 of 32 slices shown, 12 images]
[im 3/32  brain]
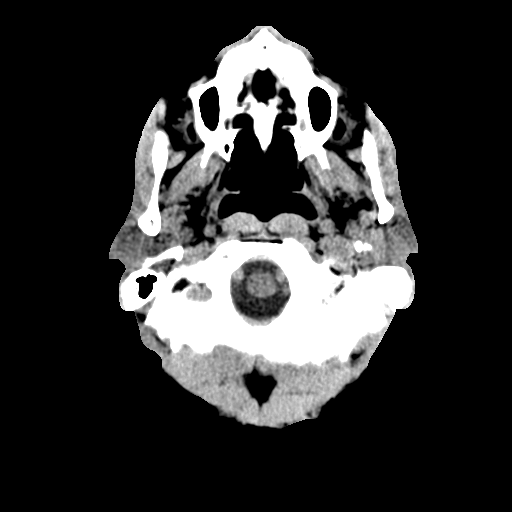
[im 3/32  bone]
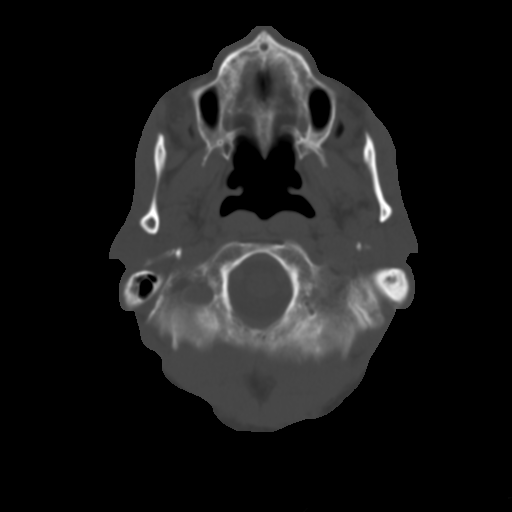
[im 7/32  brain]
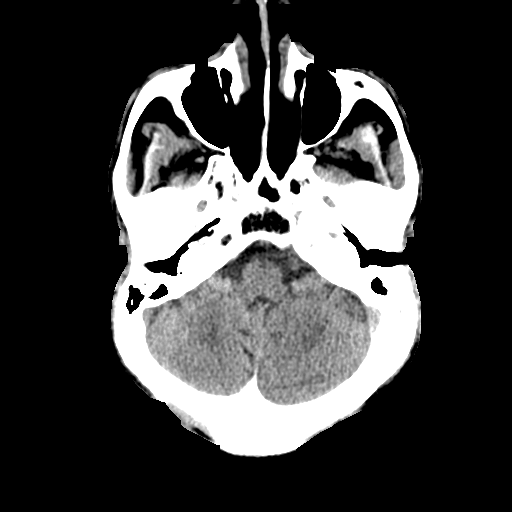
[im 9/32  brain]
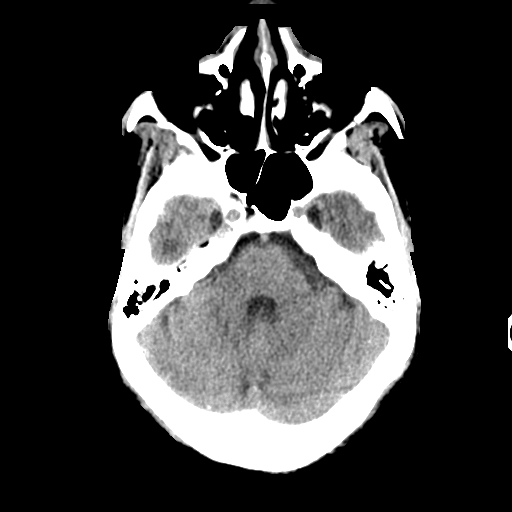
[im 13/32  brain]
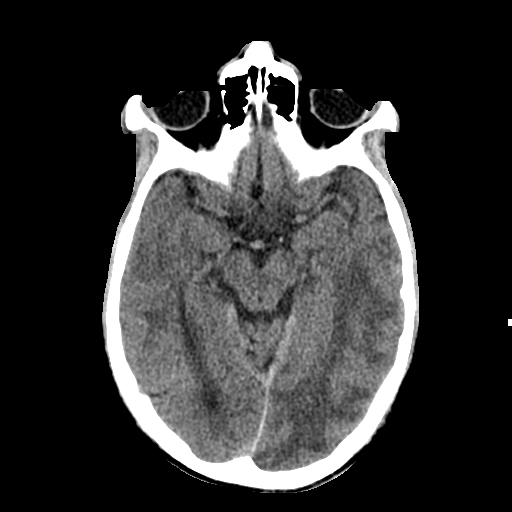
[im 17/32  brain]
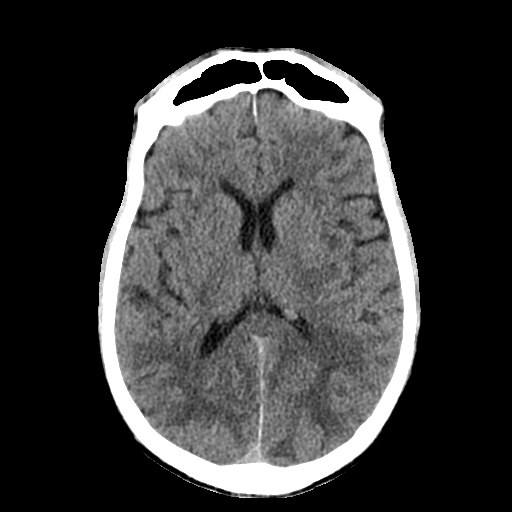
[im 17/32  bone]
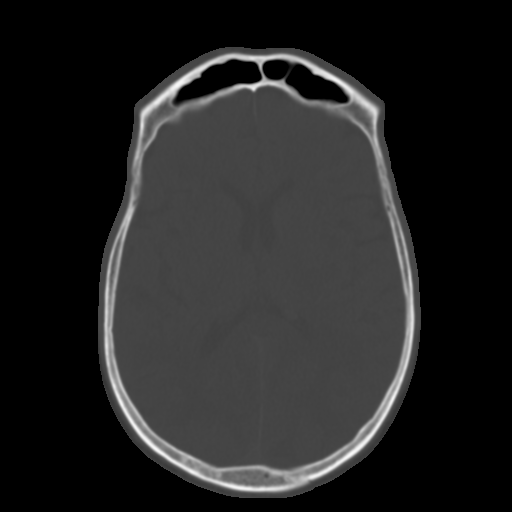
[im 19/32  brain]
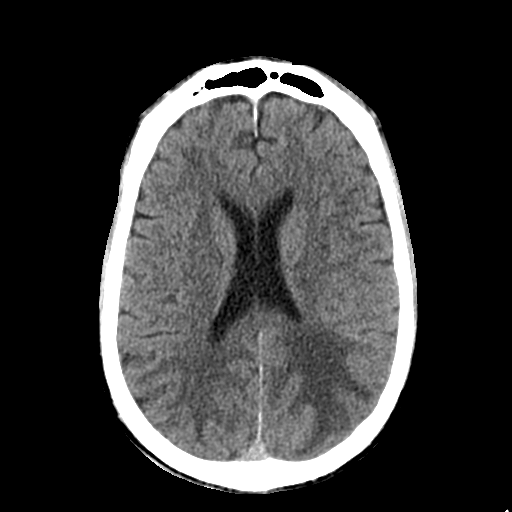
[im 23/32  brain]
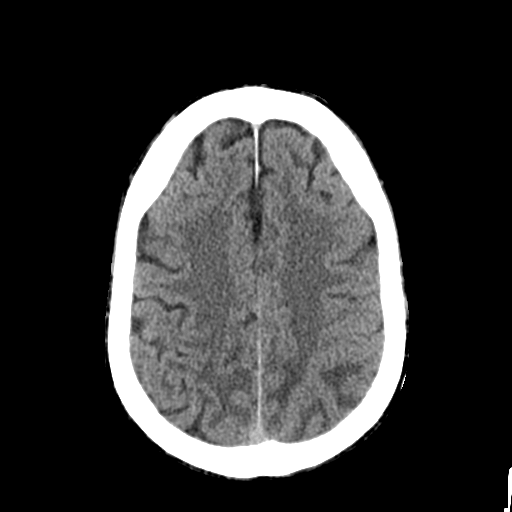
[im 25/32  brain]
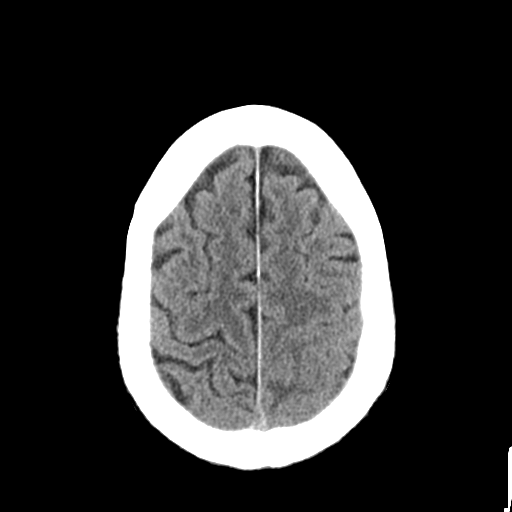
[im 29/32  brain]
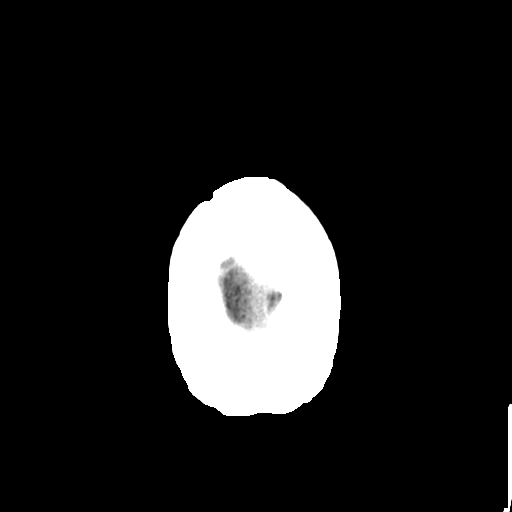
[im 29/32  bone]
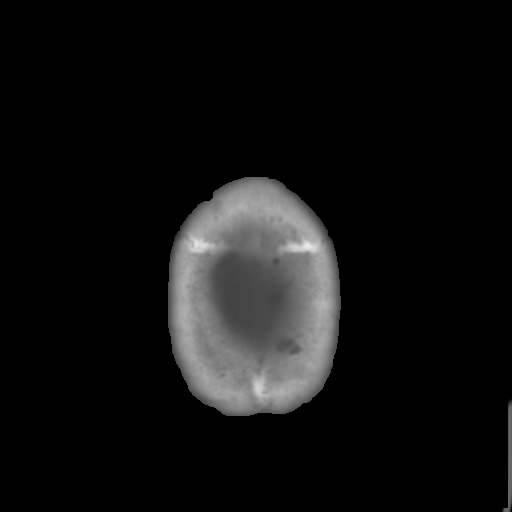

[Series 7: coronal soft tissue · coronal · 0.32mm/px · 3 of 75 slices shown]
[im 30/75  brain]
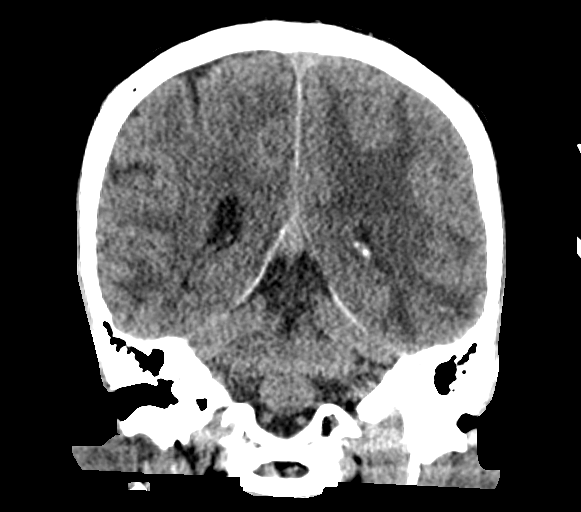
[im 35/75  brain]
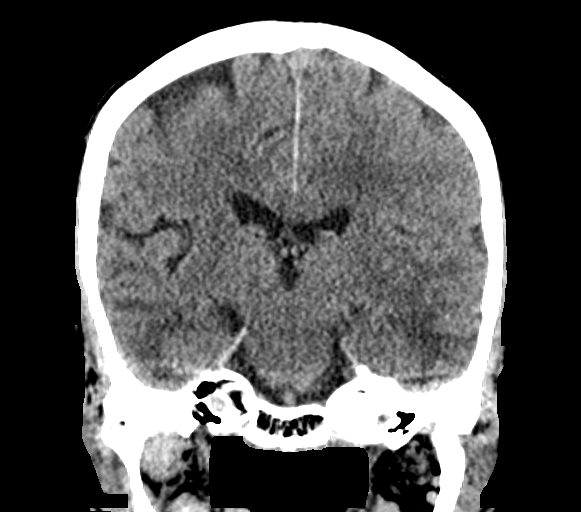
[im 40/75  brain]
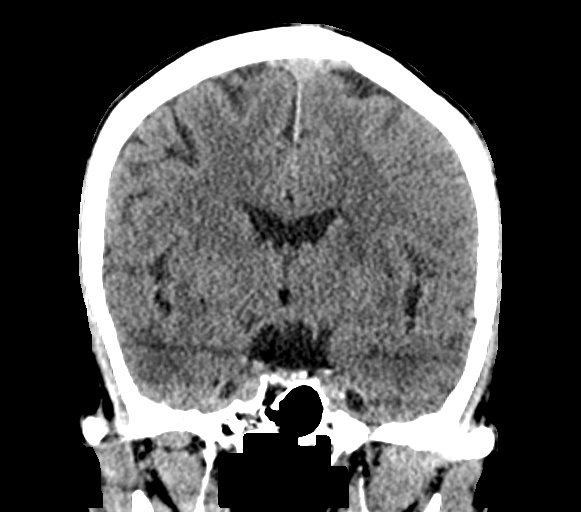

[Series 8: sagittal soft tissue · sagittal · 0.33mm/px · 3 of 54 slices shown]
[im 18/54  brain]
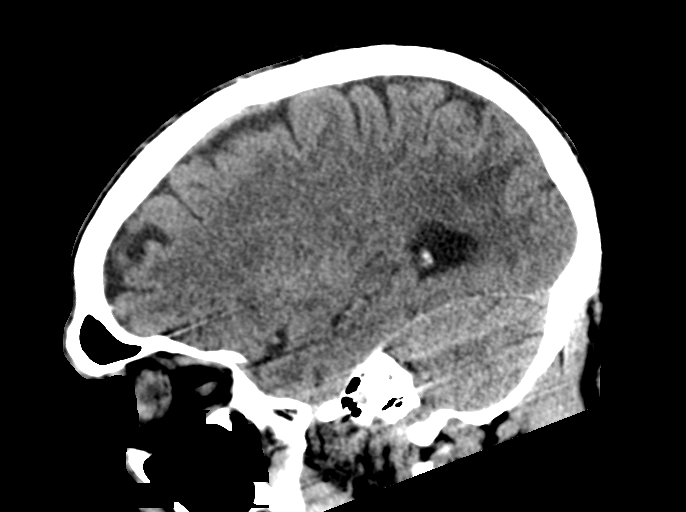
[im 27/54  brain]
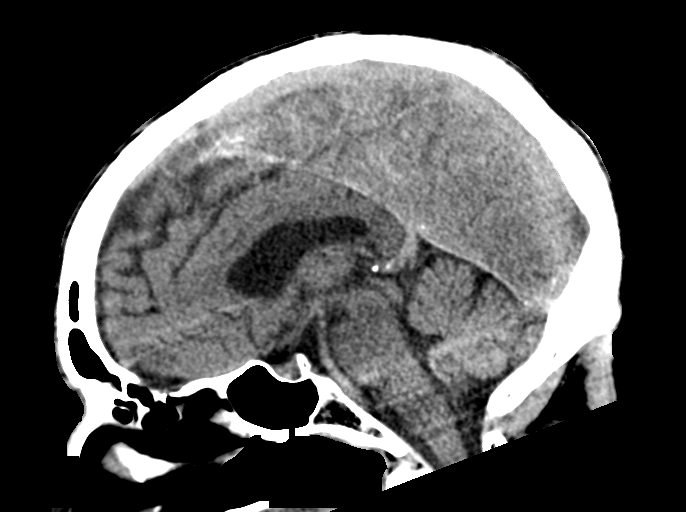
[im 36/54  brain]
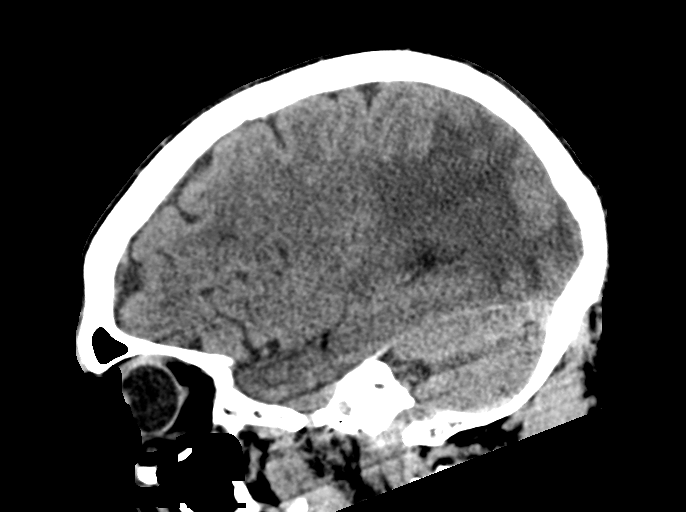

[15 of 47 positions shown; findings below may reference images not displayed]

FINDINGS: Brain: There is low-density within the white matter of the posterior
cerebral hemispheres bilaterally, left greater than right. Favor
PRES. No hemorrhage or hydrocephalus.

Vascular: No hyperdense vessel or unexpected calcification.

Skull: No acute calvarial abnormality.

Sinuses/Orbits: No acute findings

Other: None
IMPRESSION: Low-density throughout the white matter of the posterior cerebral
hemispheres bilaterally most pronounced in the left occipital lobe.
Favor PRES. No definite cortical infarct, hemorrhage or
hydrocephalus. This could be further evaluated with MRI if felt
clinically indicated.

## 2021-06-16 MED ORDER — MIDAZOLAM HCL 2 MG/2ML IJ SOLN
INTRAMUSCULAR | Status: AC
  Administered 2021-06-16: 2 mg
  Filled 2021-06-16: qty 2

## 2021-06-16 MED ORDER — THIAMINE HCL 100 MG/ML IJ SOLN: 100.0000 mg | Freq: Every day | INTRAMUSCULAR | Status: DC

## 2021-06-16 MED ORDER — ACETAMINOPHEN 325 MG PO TABS: 650.0000 mg | ORAL_TABLET | Freq: Four times a day (QID) | ORAL | Status: DC | PRN

## 2021-06-16 MED ORDER — HALOPERIDOL LACTATE 5 MG/ML IJ SOLN
INTRAMUSCULAR | Status: AC
  Administered 2021-06-16: 5 mg via INTRAVENOUS
  Filled 2021-06-16: qty 1

## 2021-06-16 MED ORDER — FOLIC ACID 1 MG PO TABS
1.0000 mg | ORAL_TABLET | Freq: Every day | ORAL | Status: DC
  Administered 2021-06-18 – 2021-06-20 (×3): 1 mg via ORAL
  Filled 2021-06-16 (×3): qty 1

## 2021-06-16 MED ORDER — THIAMINE HCL 100 MG/ML IJ SOLN
Freq: Once | INTRAVENOUS | Status: AC
  Filled 2021-06-16: qty 1000

## 2021-06-16 MED ORDER — LORAZEPAM 2 MG/ML IJ SOLN
1.0000 mg | INTRAMUSCULAR | Status: DC | PRN
  Administered 2021-06-17: 1 mg via INTRAVENOUS

## 2021-06-16 MED ORDER — LORAZEPAM 2 MG/ML IJ SOLN
2.0000 mg | Freq: Once | INTRAMUSCULAR | Status: AC
  Administered 2021-06-16: 2 mg via INTRAVENOUS
  Filled 2021-06-16: qty 1

## 2021-06-16 MED ORDER — ACETAMINOPHEN 650 MG RE SUPP: 650.0000 mg | Freq: Four times a day (QID) | RECTAL | Status: DC | PRN

## 2021-06-16 MED ORDER — LORAZEPAM 1 MG PO TABS: 1.0000 mg | ORAL_TABLET | ORAL | Status: AC | PRN

## 2021-06-16 MED ORDER — ADULT MULTIVITAMIN W/MINERALS CH
1.0000 | ORAL_TABLET | Freq: Every day | ORAL | Status: DC
  Administered 2021-06-19 – 2021-06-20 (×2): 1 via ORAL
  Filled 2021-06-16 (×3): qty 1

## 2021-06-16 MED ORDER — LORAZEPAM 2 MG/ML IJ SOLN
1.0000 mg | INTRAMUSCULAR | Status: AC | PRN
  Administered 2021-06-16: 3 mg via INTRAVENOUS
  Administered 2021-06-16 – 2021-06-17 (×3): 2 mg via INTRAVENOUS
  Administered 2021-06-17: 4 mg via INTRAVENOUS
  Filled 2021-06-16 (×3): qty 1
  Filled 2021-06-16 (×2): qty 2
  Filled 2021-06-16: qty 1

## 2021-06-16 MED ORDER — HALOPERIDOL LACTATE 5 MG/ML IJ SOLN: 5.0000 mg | Freq: Once | INTRAMUSCULAR | Status: AC

## 2021-06-16 MED ORDER — THIAMINE HCL 100 MG PO TABS: 100.0000 mg | ORAL_TABLET | Freq: Every day | ORAL | Status: DC

## 2021-06-16 NOTE — ED Notes (Signed)
Patient flailing and combative in bed.

## 2021-06-16 NOTE — H&P (Signed)
History and Physical    PLEASE NOTE THAT DRAGON DICTATION SOFTWARE WAS USED IN THE CONSTRUCTION OF THIS NOTE.   Mark Carr FYB:[id number] DOB: 28-Aug-1956 DOA: 06/16/2021  PCP: Virginia Crews, MD Patient coming from: home   I have personally briefly reviewed patient's old medical records in [place]  Chief Complaint: Altered mental status  HPI: Mark Carr is a 65 y.o. male with medical history significant for cirrhosis, chronic alcohol abuse, who is admitted to [facility] on 06/16/2021 with acute encephalopathy after presenting from home to [facility] ED for evaluation of altered mental status.   In the context of the patient's presenting altered mental status, and associated inability to contribute to history at this time, the following history is obtained via my discussions with the patient's daughter, who is present at bedside, in addition to my discussions with the EDP as well as via chart review.  Patient brought in to [facility] ED today by EMS for evaluation of 1 day of confusion and agitation.  One of the patient's daughters returned home around 1600 today to find the patient confused, agitated.  At the time, use reportedly yelling, but nonsensical in his words, and reportedly attempting to pull down the curtains in their home.  Daughter reported no signs of trauma upon arrival at home.  EMS was contacted, and the patient was brought to [facility] ED for further evaluation of this altered mental status.  At different daughter, reported that she spoke with the patient around 1500 today over the phone, at which time she felt that the patient was at his baseline mental status, and was not aware of any acute complaints from the patient.  She does not believe that the patient has been experiencing any recent fever or chills.  She does not believe that has been experiencing any recent nausea, vomiting, diarrhea.  No recent known trauma.  Patient has a documented history of  chronic alcohol abuse, and the patient's daughter conveys that the patient has been attempting to significantly reduce his alcohol consumption over the last few weeks, noting that he is now down to 4 beers per day over the course of the last 1-2 weeks, relative to at least 12-24 beers per day a few weeks ago.  She does not know when the patient's most recent prior alcohol consumption occurred.  Additionally, daughter confirms that the patient also possesses a history of polysubstance abuse, noting a history of cocaine use.  The patient reportedly recently moved into the home of his daughter to assist with accountability in terms of his recreational drug use.  Per daughter, the patient has used any recreational drugs, no cocaine for several weeks at least.   Per chart review, there is documentation of a history of cirrhosis.  Does not appear that the patient is on any diuretic medications or lactulose at home.  Unclear if his cirrhosis is ever been complicated by hepatic encephalopathy.  Daughter is not aware that the patient has had any recent changes to his home medications, does not believe that he takes any prescription medications as an outpatient, whether scheduled or as needed.   Per chart review, it appears that the patient was hospitalized at [facility] from 04/19/21 - 04/23/21 for COVID-pneumonia after presenting with objective fever at that time, with positive COVID-19 PCR noted on the day of admission of that hospitalization.  No report of any associated hypoxia throughout the hospital course.  He was reportedly treated with remdesivir.  Hospital  course also notable for 1 out of 4 blood cultures drawn on the day of admission were found to be positive for staph lugdunensis, of unclear source or significance, including the possibility of contamination, per discharge summary.  Per discharge summary, it appears that he was treated with 2 days of IV cefazolin, with repeat blood cultures found to demonstrate no  growth to date.  During the hospitalization, he underwent echocardiogram, which demonstrated no evidence of valvular vegetation.     ED Course:  Vital signs in the ED were notable for the following: Tetramex 97.7, heart rate 93-1 07; blood pressure 115/77 -150/95; respiratory rate 17-33, with most recent respiratory rate noted to be 22, oxygen saturation 95 to 100% on room air.  Labs were notable for the following: CMP notable for the following: Sodium 139, chloride 108, bicarbonate 16, anion gap 15, BUN 10, creatinine 0.93 relative to most recent prior serum creatinine data point of 0.60 on 04/23/2021, glucose 123, calcium 9.3.  Presenting liver enzymes as compared to most recent prior set that were checked on 04/23/2021 were notable for the following: Alkaline phosphatase 93 compared to 62 on 04/23/2021, AST 42 compared to 22, ALT 21 compared to most recent prior of 12, and total bilirubin 0.8 relative to 0.6.  CPK 112, serum ethanol less than 10.  Urinary drug screen was positive only for benzodiazepines, and was noted to be drawn after the patient had received multiple doses of benzodiazepines in the emergency department today, as further qualified below.  Ammonia level ordered, with result currently pending.  CBC notable for the following: White blood cell count 7200.   EKG shows sinus rhythm with heart rate 99, normal intervals, no evidence of T wave or ST changes, including no evidence of ST elevation.  CT head showed no evidence of acute infarct and no evidence of intracranial hemorrhage, while showing low-density in the white matter of the posterior cerebral hemispheres, with differential, as included by radiology read, including PRES.   While in the ED, the following were administered: Haldol 5 mg IV x1, Ativan 2 mg IV x1, and Versed 2 mg IV x2.  Subsequently patient was admitted to the PCU for further evaluation and management of his presenting acute encephalopathy.     Review of Systems: As  per HPI otherwise 10 point review of systems negative.   Past Medical History:  Diagnosis Date   Cirrhosis of liver ([place])    Wrist fracture     Past Surgical History:  Procedure Laterality Date   WRIST FRACTURE SURGERY Right 2016    Social History:  reports that he has been smoking cigarettes. He has a 20.00 pack-year smoking history. He has never used smokeless tobacco. He reports current alcohol use of about 4.0 standard drinks of alcohol per week. He reports previous drug use.   No Known Allergies  Family History  Problem Relation Age of Onset   Hypertension Mother    Deep vein thrombosis Mother    Cancer Sister        unknown type   Heart disease Brother    Cancer Maternal Grandmother      Prior to Admission medications   Medication Sig Start Date End Date Taking? Authorizing Provider  tadalafil (CIALIS) 20 MG tablet Take 1 tablet (20 mg total) by mouth every three (3) days as needed for erectile dysfunction. 05/13/21  Yes Jerrol Banana., MD     Objective    Physical Exam: Vitals:   06/16/21 6222  06/16/21 1640 06/16/21 1700 06/16/21 1730  BP:   115/77 (!) 150/95  Pulse: 100 93 98 100  Resp: 19 (!) 23 18 (!) 33  SpO2: 95% 95% 95% 97%    General: appears to be stated age; somnolent; spontaneously moving all 4 extrem; unable to follow instructions; non-verbal Skin: warm, dry, no rash Head:  AT/[place] Mouth:  Oral mucosa membranes appear dry, normal dentition Neck: supple; trachea midline Heart:  RRR; did not appreciate any M/R/G Lungs: CTAB, did not appreciate any wheezes, rales, or rhonchi Abdomen: + BS; soft, ND, NT Vascular: 2+ pedal pulses b/l; 2+ radial pulses b/l Extremities: no peripheral edema, no muscle wasting Neuro: In the setting of the patient's current mental status and associated inability to follow instructions, unable to perform full neurologic exam at this time.  As such, assessment of strength, sensation, and cranial nerves is limited at  this time. Patient noted to spontaneously move all 4 extremities. No tremors.      Labs on Admission: I have personally reviewed following labs and imaging studies  CBC: Recent Labs  Lab 06/16/21 1628  WBC 7.2  NEUTROABS 2.6  HGB 13.6  HCT 39.8  MCV 97.1  PLT 092   Basic Metabolic Panel: Recent Labs  Lab 06/16/21 1628  NA 139  K 3.7  CL 108  CO2 16*  GLUCOSE 123*  BUN 10  CREATININE 0.93  CALCIUM 9.3   GFR: CrCl cannot be calculated (Unknown ideal weight.). Liver Function Tests: Recent Labs  Lab 06/16/21 1628  AST 42*  ALT 21  ALKPHOS 93  BILITOT 0.8  PROT 8.2*  ALBUMIN 4.2   No results for input(s): LIPASE, AMYLASE in the last 168 hours. No results for input(s): AMMONIA in the last 168 hours. Coagulation Profile: No results for input(s): INR, PROTIME in the last 168 hours. Cardiac Enzymes: Recent Labs  Lab 06/16/21 1628  CKTOTAL 112   BNP (last 3 results) No results for input(s): PROBNP in the last 8760 hours. HbA1C: No results for input(s): HGBA1C in the last 72 hours. CBG: No results for input(s): GLUCAP in the last 168 hours. Lipid Profile: No results for input(s): CHOL, HDL, LDLCALC, TRIG, CHOLHDL, LDLDIRECT in the last 72 hours. Thyroid Function Tests: No results for input(s): TSH, T4TOTAL, FREET4, T3FREE, THYROIDAB in the last 72 hours. Anemia Panel: No results for input(s): VITAMINB12, FOLATE, FERRITIN, TIBC, IRON, RETICCTPCT in the last 72 hours. Urine analysis:    Component Value Date/Time   COLORURINE YELLOW (A) 04/19/2021 0604   APPEARANCEUR CLEAR (A) 04/19/2021 0604   LABSPEC 1.010 04/19/2021 0604   PHURINE 7.0 04/19/2021 0604   GLUCOSEU NEGATIVE 04/19/2021 0604   HGBUR SMALL (A) 04/19/2021 0604   BILIRUBINUR NEGATIVE 04/19/2021 0604   KETONESUR NEGATIVE 04/19/2021 0604   PROTEINUR NEGATIVE 04/19/2021 0604   NITRITE NEGATIVE 04/19/2021 0604   LEUKOCYTESUR MODERATE (A) 04/19/2021 0604    Radiological Exams on Admission: CT  HEAD WO CONTRAST (5MM)  Result Date: 06/16/2021 CLINICAL DATA:  Mental status change, unknown cause EXAM: CT HEAD WITHOUT CONTRAST TECHNIQUE: Contiguous axial images were obtained from the base of the skull through the vertex without intravenous contrast. COMPARISON:  04/01/2021 FINDINGS: Brain: There is low-density within the white matter of the posterior cerebral hemispheres bilaterally, left greater than right. Favor PRES. No hemorrhage or hydrocephalus. Vascular: No hyperdense vessel or unexpected calcification. Skull: No acute calvarial abnormality. Sinuses/Orbits: No acute findings Other: None IMPRESSION: Low-density throughout the white matter of the posterior cerebral hemispheres bilaterally  most pronounced in the left occipital lobe. Favor PRES. No definite cortical infarct, hemorrhage or hydrocephalus. This could be further evaluated with MRI if felt clinically indicated. Electronically Signed   By: Rolm Baptise M.D.   On: 06/16/2021 17:18     EKG: Independently reviewed, with result as described above.    Assessment/Plan   DRAY DENTE is a 65 y.o. male with medical history significant for cirrhosis, chronic alcohol abuse, who is admitted to [facility] on 06/16/2021 with acute encephalopathy after presenting from home to [facility] ED for evaluation of altered mental status.    Principal Problem:   Acute encephalopathy Active Problems:   Tobacco use disorder   Alcohol use   Acute renal injury ([place])   Metabolic acidosis   Alcoholic cirrhosis of liver without ascites (HCC)     #) acute encephalopathy: Acute onset of confusion and agitation over the last day, with underlying source not entirely clear at this time.  Given report of a patient recently engaging in a significant reduction in his daily alcohol consumption, with presenting serum ethanol level found to be less than 10, and unclear timing of consumption of last alcohol, the differential includes possibility of  encephalopathy as a consequence of alcohol withdrawal.  Also considered possibility of toxic encephalopathy in the setting of the patient's history of recreational drug use, including that of prior cocaine use.  However, urinary drug screen noted to be pan negative, with the exception of the benzodiazepines administered in the ED today, as above.  Of note, the patient was diagnosed with COVID-pneumonia in mid June 2022.  In the absence of overt source of his underlying presenting acute encephalopathy, will expand infectious work-up, including assessment for potential secondary bacterial pneumonia given this history and timeframe.  Also check urinalysis.  Of note, in the absence of any associated fever or leukocytosis, SIRS criteria are not met for sepsis at this time.  Of note, urine most recent previous hospitalization, findings included 1 out of 4 bottles of blood cultures drawn on day of admission noted to be positive for staph lugdunensis prompting reported 2-day course of IV cefazolin with ensuing repeat blood cultures noted to demonstrate no growth, as further detailed above.   Given the apparent rather acute onset of the patient's altered mental status, will also check MRI brain.  Of note, presenting CT head showed no evidence of acute infarct or intracranial hemorrhage, while showing low-density findings in the white matter of the posterior cerebral hemispheres raising possibility of PRES, which does appear consistent with the patient's systolic blood pressures in the ED thus far which have been in the 120s to 150s mmHg. no evidence of seizures.  While likely to explain totality of the patient's encephalopathic presentation, presenting labs consistent with borderline anion gap metabolic acidosis.  Additionally, he appears clinically dehydrated, and is noted to have acute kidney injury.  He also has a long smoking history and will therefore assess VBG to evaluate for any contributory hypercapnic  encephalopathy.  Additional laboratory evaluation for other metabolic contributions to presenting acute encephalopathy, as detailed below.   Plan: Check ammonia level.  Check urinalysis, chest x-ray.  MRI brain.  We will also check MMA, TSH, VBG, as above.  Repeat CMP and CBC in the morning.  Further evaluation management of potential alcohol withdrawal, including initiation of CIWA protocol and as needed IV Ativan.  N.p.o. until patient's mental status improves sufficiently that he is able to participate in and pass nursing bedside  swallow study.  Serial neurochecks.  Further evaluation and treatment of presenting acute kidney injury, as further detailed below, including gentle IV fluids.  Check lactic acid.      #) Prior COVID-19 infection: Patient hospitalized in June 22 with COVID-pneumonia, positive COVID PCR on 04/19/2021. As it has been greater than 21 days but within 3 months since the patient's positive COVID-19 test  with interval improvement in initial symptoms, there is currently no indication to repeat COVID-19 testing or to maintain Airborne/Contact precautions.  However, in setting of presenting acute encephalopathy of unclear source, will assess for evidence of underlying bacterial pneumonia, as further detailed below.  Plan: Check chest x-ray, as above.  Repeat CBC in the morning.      #) non-anion gap metabolic acidosis: Presenting labs reflect metabolic acidosis with borderline elevation in anion gap, potentially reflecting mixed acid-base disturbance.  As described above, criteria not met for sepsis, and no overt evidence of underlying infectious source at this time.  Will check lactic acid level, which may well be elevated purely on the basis of alcoholic keto lactic acidosis.  Additional factors likely influencing presenting metabolic acidosis include the presence of acute kidney injury, as further detailed below.   Plan: Check VBG, including for evaluation of hypercapnic  encephalopathy.  Check urinalysis, chest x-ray.  Check lactic acid level.  Repeat CMP and CBC in the morning.  Add on salicylate level.  Further evaluation of acute kidney injury, as further detailed below.      #) Acute kidney injury: Presenting serum creatinine found to be 0.93 relative measures by value 0.60 in June 2022.  Suspect an element of prerenal influences with clinical evidence of mild dehydration at presentation.  Plan: Check urinalysis with microscopy.  Add on random urine sodium as well as random urine creatinine.  Monitor strict I's and O's and daily weights.  Tempt avoid nephrotoxic agents.  Continuous IV fluids overnight via banana bag, as further detailed below.  Repeat BMP in the morning.  Add on serum magnesium level.        #) Cirrhosis: Per chart review, patient has documented history of alcoholic cirrhosis.  Does not appear to be in any pharmacologic management. As an outpatient, including no diuretics or any lactulose at home.  No documented history, per chart review, of any prior history of hepatic encephalopathy.  Unable to calculate meld score at this time in the absence of INR.  Presenting liver enzymes without significant change relative to most recent prior values on 04/23/2021. Will add-on INR at this time to further evaluate hepatic synthetic function.  Of note, while the differential for presenting metabolic encephalopathy includes that stability of hepatic encephalopathy, the latter represents a diagnosis of exclusion, and warrants additional evaluation for alternative encephalopathic services, as detailed above.  Plan: Add on INR.  Check serum ammonia level.  Repeat CMP tomorrow.  As patient's mental status subsequently previous, will further counseled the patient on the importance of additional reduction in his alcohol  consumption.  Monitor strict I's and O's.  Add on serum magnesium level.       #) Chronic Alcohol Abuse: Documented history of chronic  alcohol abuse, with patient reportedly recently engaging in significant reduction in his daily alcohol consumption, as further quantified above.  Timing of most recent consumption of alcohol currently unclear, and serum ethanol found to be less than 10 at time of presentation, raising the possibility of an element of alcohol withdrawal contributing to his presenting acute encephalopathy, as  further detailed above.  We will initiate CIWA protocol, closely monitor ensuing trend and vital signs as well as close monitoring of electrolytes, as further described below.   Plan:  Close monitoring of ensuing BP and HR via routine VS. Symptoms-based CIWA protocol with prn Ativan ordered. Seizure precautions. Telemetry. Add-on serum Mg level. Check serum phosphorus level. Repeat CMP in the morning. Check INR. UDS. Banana bag x 1 overnight, followed by transition to oral folic acid, thiamine, and multivitamin supplementation in the morning, with timing of his conversion to oral supplementation potentially requiring revision depending upon the ensuing trend of the patient's mental status and associated ability to tolerate p.o. safely. Consult to transition of care team placed.       #) Tobacco abuse: Patient Dr. Conveys that the patient is a long-term, current smoker.   Plan: As the patient's mental status improves, anticipate counseling patient on the importance of reduction in smoking habits with goal of ultimately completely abstaining from cigarette smoking.       DVT prophylaxis: scd's  Code Status: Full code Family Communication: Case was discussed with patient's daughter, who was present at bedside Disposition Plan: Per Rounding Team Consults called: none;  Admission status: Inpatient, pcu     Of note, this patient was added by me to the following Admit List/Treatment Team: [email or web address].      PLEASE NOTE THAT DRAGON DICTATION SOFTWARE WAS USED IN THE CONSTRUCTION OF THIS NOTE.   [place] [facility] Pager [phone] From [place]  Otherwise, please contact night-coverage  [email or web address] Password [facility]   06/16/2021, 7:11 PM

## 2021-06-16 NOTE — ED Provider Notes (Signed)
[facility]  ____________________________________________   Event Date/Time   First MD Initiated Contact with Patient 06/16/21 [phone]     (approximate)  I have reviewed the triage vital signs and the nursing notes.   HISTORY  Chief Complaint Altered Mental Status    HPI Mark Carr is a 65 y.o. male past medical history of cirrhosis, alcohol use, malnutrition who presents with altered mental status.  Per EMS family said that he had a change in mental status approximately 30 minutes prior to arrival.  He may have had some alcohol yesterday but otherwise they deny knowing of any recent drug or alcohol use.  The patient is unable to provide any history secondary to his altered mental status I am unable to obtain history regarding onset, constant, quality or associated symptoms.         Past Medical History:  Diagnosis Date   Cirrhosis of liver ([place])    Wrist fracture     Patient Active Problem List   Diagnosis Date Noted   Coag negative Staphylococcus bacteremia 04/21/2021   Pneumonia due to COVID-19 virus 04/19/2021   Hypokalemia 04/19/2021   Protein-calorie malnutrition, severe 04/02/2021   Hyponatremia 04/01/2021   Acute renal injury ([place]) 04/01/2021   Cirrhosis of liver ([place])    Fall at home, initial encounter    Diarrhea         Generalized weakness    Uninsured 11/29/2019   Unintentional weight loss 11/29/2019   Tobacco use disorder 05/18/2019   Macrocytosis 05/18/2019   Elevated LFTs 05/18/2019   Steatosis of liver 05/18/2019   Chronic fatigue 05/18/2019   Colon cancer screening 05/18/2019   Alcohol use 05/18/2019    Past Surgical History:  Procedure Laterality Date   WRIST FRACTURE SURGERY Right 2016    Prior to Admission medications   Medication Sig Start Date End Date Taking? Authorizing Provider  tadalafil (CIALIS) 20 MG tablet Take 1 tablet (20 mg total) by mouth every three (3) days as needed for erectile dysfunction.  05/13/21  Yes Jerrol Banana., MD    Allergies Patient has no known allergies.  Family History  Problem Relation Age of Onset   Hypertension Mother    Deep vein thrombosis Mother    Cancer Sister        unknown type   Heart disease Brother    Cancer Maternal Grandmother     Social History Social History   Tobacco Use   Smoking status: Every Day    Packs/day: 0.50    Years: 40.00    Pack years: 20.00    Types: Cigarettes   Smokeless tobacco: Never  Vaping Use   Vaping Use: Never used  Substance Use Topics   Alcohol use: Yes    Alcohol/week: 4.0 standard drinks    Types: 4 Cans of beer per week    Comment: 4 beers a day   Drug use: Not Currently    Review of Systems   Review of Systems  Unable to perform ROS: Mental status change   Physical Exam Updated Vital Signs BP (!) 150/95   Pulse 100   Resp (!) 33   SpO2 97%   Physical Exam Constitutional:      General: He is in acute distress.     Appearance: He is diaphoretic.     Comments: Patient is groaning, trying to get out of bed  HENT:     Head: Normocephalic and atraumatic.     Nose: Nose  normal.     Mouth/Throat:     Mouth: Mucous membranes are moist.  Eyes:     Pupils: Pupils are equal, round, and reactive to light.     Comments: Pupils are 3 mm and symmetrically reactive  Cardiovascular:     Rate and Rhythm: Regular rhythm. Tachycardia present.  Pulmonary:     Comments: Tachypneic, no respiratory distress Abdominal:     General: Abdomen is flat. There is no distension.     Tenderness: There is no abdominal tenderness. There is no guarding.  Musculoskeletal:        General: No swelling or tenderness. Normal range of motion.     Cervical back: Normal range of motion.  Skin:    General: Skin is warm.     Coloration: Skin is not jaundiced.  Neurological:     General: No focal deficit present.     Comments: Patient moves all extremities spontaneously Does not follow commands Groaning but  no other verbal response  Psychiatric:     Comments: Agitated     LABS (all labs ordered are listed, but only abnormal results are displayed)  Labs Reviewed  COMPREHENSIVE METABOLIC PANEL - Abnormal; Notable for the following components:      Result Value   CO2 16 (*)    Glucose, Bld 123 (*)    Total Protein 8.2 (*)    AST 42 (*)    All other components within normal limits  CBC WITH DIFFERENTIAL/PLATELET - Abnormal; Notable for the following components:   RBC 4.10 (*)    Abs Immature Granulocytes 0.11 (*)    All other components within normal limits  URINE DRUG SCREEN, QUALITATIVE ([facility] ONLY) - Abnormal; Notable for the following components:   Benzodiazepine, Ur Scrn POSITIVE (*)    All other components within normal limits  ETHANOL  CK  AMMONIA   ____________________________________________  EKG  LVH, normal sinus rhythm, normal axis, normal intervals, no ischemic changes ____________________________________________  RADIOLOGY I, Madelin Headings, personally viewed and evaluated these images (plain radiographs) as part of my medical decision making, as well as reviewing the written report by the radiologist.  ED MD interpretation: CT head obtained which per radiology read is concerning for press    ____________________________________________   PROCEDURES  Procedure(s) performed (including Critical Care):  Procedures   ____________________________________________   INITIAL IMPRESSION / ASSESSMENT AND PLAN / ED COURSE     The patient is a 65 year old male with a history of cirrhosis who presents with acute onset of altered mental status.  Patient's vital signs are notable for tachypnea and tachycardia.  He appears diaphoretic and agitated does not follow commands but moves all extremities.  Differential includes tox or metabolic pathology, acute decompensated cirrhosis, intoxication, acute CNS event.  Will work-up broadly and take to CT.  Patient was given  2 mg x2 of Versed to facilitate imaging.  His CT is concerning for PRES. interestingly he is not very hypertensive so no indication for blood pressure agents at this time.  Labs are notable for a CO2 of 16 otherwise fairly normal.  Patient became significantly agitated again requiring Haldol and Ativan.  In speaking with his daughter [name] seems like this change was rather acute for him and there is not a clear history of him using a substance.  Initially his ethanol level is undetectable and his UDS here is only positive for benzos which we administered.  Given his agitated delirium and his concerning CT findings of press will  admit for MRI and ongoing work-up.  Clinical Course as of 06/16/21 1855  Mon Jun 16, 2021  1728 IMPRESSION: Low-density throughout the white matter of the posterior cerebral hemispheres bilaterally most pronounced in the left occipital lobe. Favor PRES. No definite cortical infarct, hemorrhage or hydrocephalus. This could be further evaluated with MRI if felt clinically indicated.   [KM]    Clinical Course User Index [KM] Rada Hay, MD     ____________________________________________   FINAL CLINICAL IMPRESSION(S) / ED DIAGNOSES  Final diagnoses:  Altered mental status, unspecified altered mental status type     ED Discharge Orders     None        Note:  This document was prepared using Dragon voice recognition software and may include unintentional dictation errors.    Rada Hay, MD 06/16/21 [phone]

## 2021-06-16 NOTE — ED Notes (Signed)
Patient continues to pull tele leads and monitoring equipment off.

## 2021-06-16 NOTE — ED Triage Notes (Signed)
AMS since 1345 today; pt hypertensive per [facility]. PT combative on arrival.

## 2021-06-16 NOTE — ED Notes (Signed)
Patient escorted by this RN on monitor to CT.

## 2021-06-17 ENCOUNTER — Inpatient Hospital Stay: Payer: Medicare Other

## 2021-06-17 DIAGNOSIS — I6783 Posterior reversible encephalopathy syndrome: Principal | ICD-10-CM

## 2021-06-17 DIAGNOSIS — R4182 Altered mental status, unspecified: Secondary | ICD-10-CM

## 2021-06-17 DIAGNOSIS — G9340 Encephalopathy, unspecified: Secondary | ICD-10-CM

## 2021-06-17 LAB — CBC
HCT: 36.9 % — ABNORMAL LOW (ref 39.0–52.0)
Hemoglobin: 12.9 g/dL — ABNORMAL LOW (ref 13.0–17.0)
MCH: 33.1 pg (ref 26.0–34.0)
MCHC: 35 g/dL (ref 30.0–36.0)
MCV: 94.6 fL (ref 80.0–100.0)
Platelets: 133 10*3/uL — ABNORMAL LOW (ref 150–400)
RBC: 3.9 MIL/uL — ABNORMAL LOW (ref 4.22–5.81)
RDW: 13.4 % (ref 11.5–15.5)
WBC: 6.2 10*3/uL (ref 4.0–10.5)
nRBC: 0 % (ref 0.0–0.2)

## 2021-06-17 LAB — BLOOD GAS, VENOUS
Acid-base deficit: 0.9 mmol/L (ref 0.0–2.0)
Bicarbonate: 24.9 mmol/L (ref 20.0–28.0)
O2 Saturation: 66.2 %
Patient temperature: 37
pCO2, Ven: 44 mmHg (ref 44.0–60.0)
pH, Ven: 7.36 (ref 7.250–7.430)
pO2, Ven: 36 mmHg (ref 32.0–45.0)

## 2021-06-17 LAB — PROTIME-INR
INR: 1.2 (ref 0.8–1.2)
Prothrombin Time: 15.3 seconds — ABNORMAL HIGH (ref 11.4–15.2)

## 2021-06-17 LAB — COMPREHENSIVE METABOLIC PANEL
ALT: 18 U/L (ref 0–44)
AST: 43 U/L — ABNORMAL HIGH (ref 15–41)
Albumin: 3.8 g/dL (ref 3.5–5.0)
Alkaline Phosphatase: 75 U/L (ref 38–126)
Anion gap: 8 (ref 5–15)
BUN: 10 mg/dL (ref 8–23)
CO2: 23 mmol/L (ref 22–32)
Calcium: 8.7 mg/dL — ABNORMAL LOW (ref 8.9–10.3)
Chloride: 108 mmol/L (ref 98–111)
Creatinine, Ser: 0.72 mg/dL (ref 0.61–1.24)
GFR, Estimated: >60 mL/min (ref >60)
Glucose, Bld: 109 mg/dL — ABNORMAL HIGH (ref 70–99)
Potassium: 3.8 mmol/L (ref 3.5–5.1)
Sodium: 139 mmol/L (ref 135–145)
Total Bilirubin: 1.4 mg/dL — ABNORMAL HIGH (ref 0.3–1.2)
Total Protein: 7.5 g/dL (ref 6.5–8.1)

## 2021-06-17 LAB — MAGNESIUM: Magnesium: 1.9 mg/dL (ref 1.7–2.4)

## 2021-06-17 LAB — AMMONIA: Ammonia: 10 umol/L (ref 9–35)

## 2021-06-17 LAB — SALICYLATE LEVEL: Salicylate Lvl: <7 mg/dL — ABNORMAL LOW (ref 7.0–30.0)

## 2021-06-17 LAB — PHOSPHORUS: Phosphorus: 2.5 mg/dL (ref 2.5–4.6)

## 2021-06-17 IMAGING — MR MR HEAD W/O CM
12 series · 48 of 48 positions shown · non-contrast
Comparison: Correlation made with CT head [DATE]

CLINICAL DATA: Mental status change, unknown cause

EXAM:
MRI HEAD WITHOUT CONTRAST
TECHNIQUE: Multiplanar, multiecho pulse sequences of the brain and surrounding
structures were obtained without intravenous contrast.

[Series 5: ax dwi_tracew · axial · 3.0mm · 0.71mm/px · z∈[-100,+64]mm · 5 of 56 slices shown]
[im 1/56]
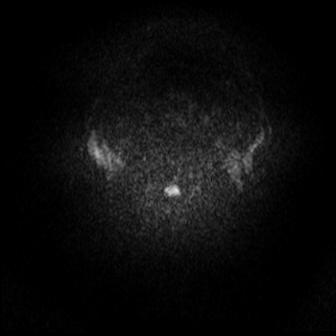
[im 14/56]
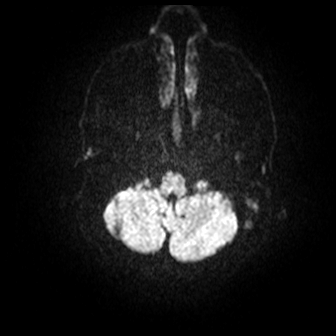
[im 28/56]
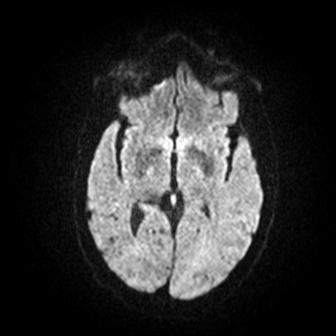
[im 42/56]
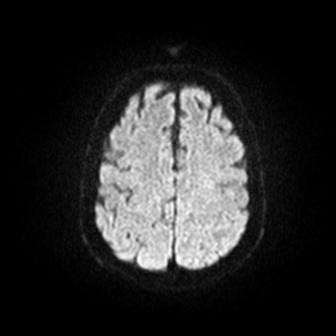
[im 56/56]
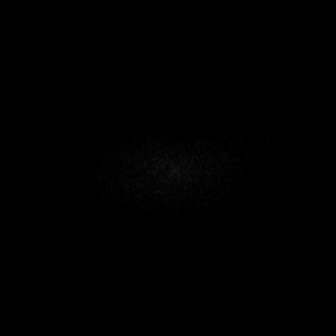

[Series 6: ax dwi_adc · axial · 3.0mm · 0.71mm/px · z∈[-100,+61]mm · 6 of 55 slices shown]
[im 1/55]
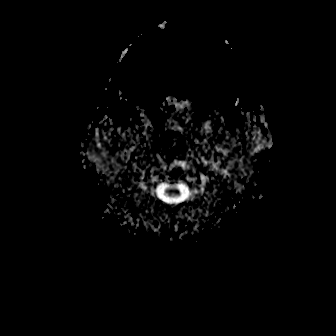
[im 11/55]
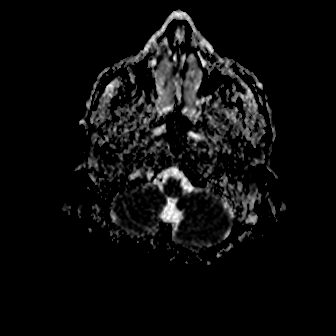
[im 22/55]
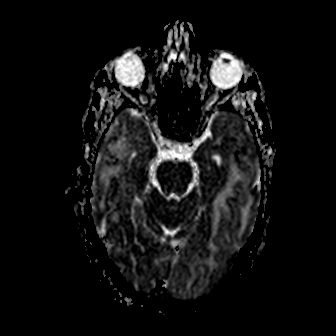
[im 33/55]
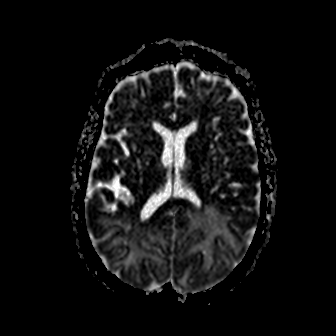
[im 44/55]
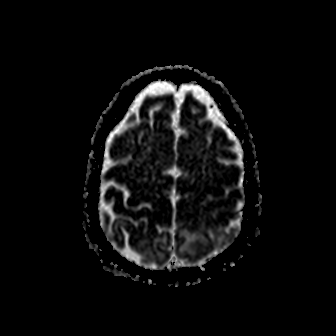
[im 55/55]
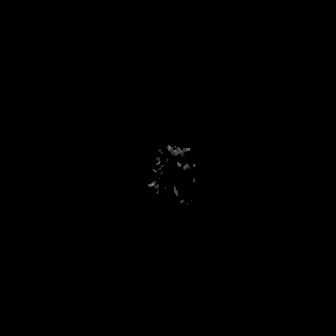

[Series 7: cor dwi_tracew · coronal · 5.0mm · 0.68mm/px · 4 of 36 slices shown]
[im 1/36]
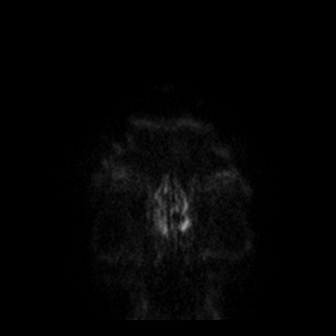
[im 12/36]
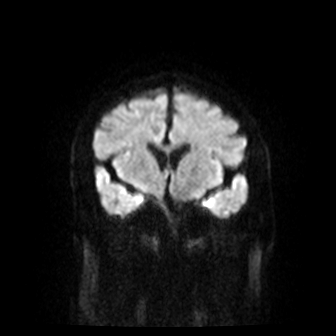
[im 24/36]
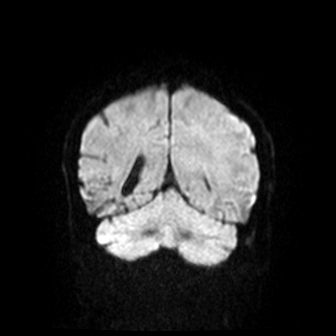
[im 36/36]
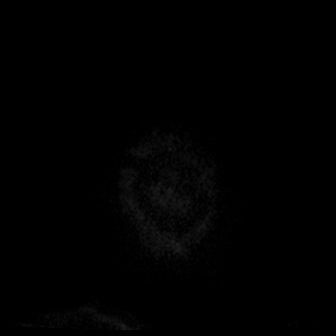

[Series 8: cor dwi_adc · coronal · 5.0mm · 0.68mm/px · 4 of 36 slices shown]
[im 1/36]
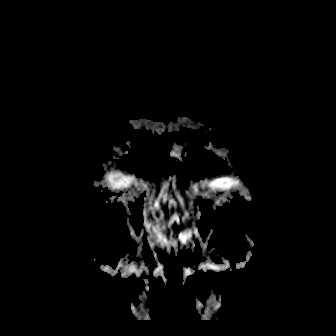
[im 12/36]
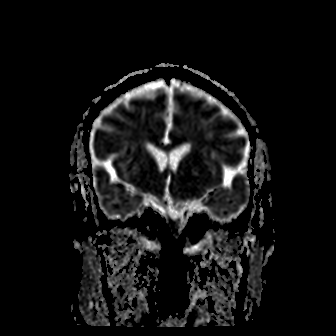
[im 24/36]
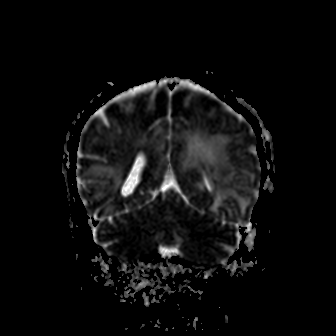
[im 36/36]
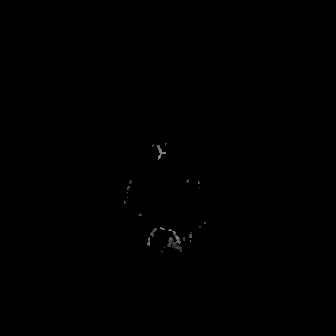

[Series 9: T1 · sagittal · 5.0mm · 0.47mm/px · 2 of 20 slices shown (1 of 2)]
[im 1/20]
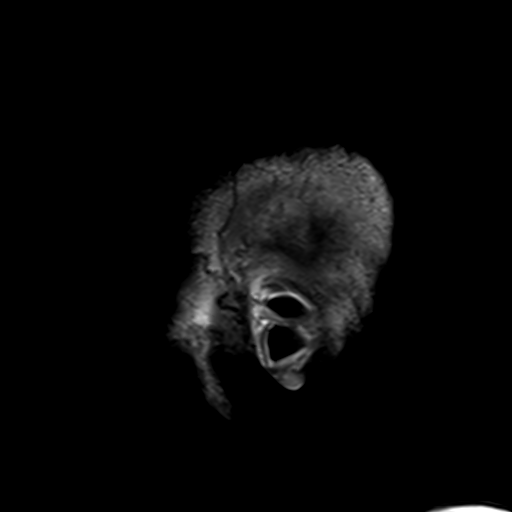
[im 20/20]
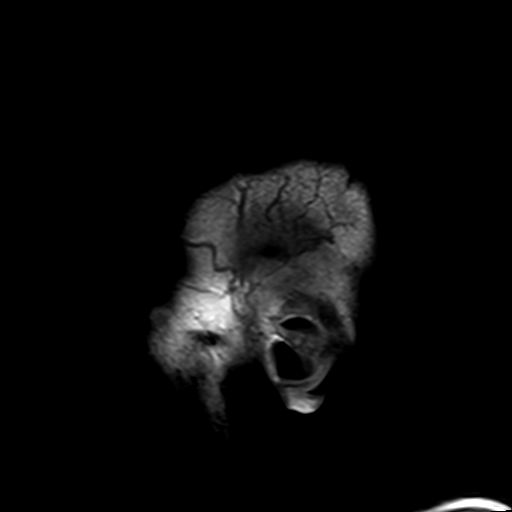

[Series 10: T2 · axial · 5.0mm · 0.86mm/px · z∈[-87,+56]mm · 3 of 25 slices shown (1 of 2)]
[im 1/25]
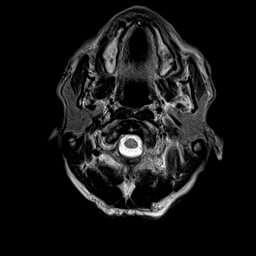
[im 13/25]
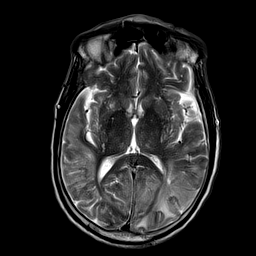
[im 25/25]
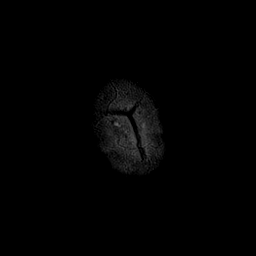

[Series 11: mag_images · axial · 3.0mm · 0.90mm/px · z∈[-92,+60]mm · 5 of 52 slices shown]
[im 1/52]
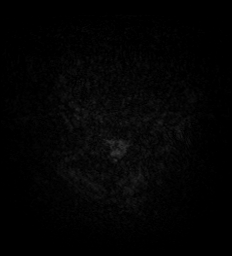
[im 13/52]
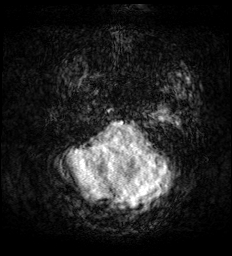
[im 26/52]
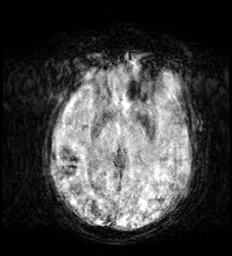
[im 39/52]
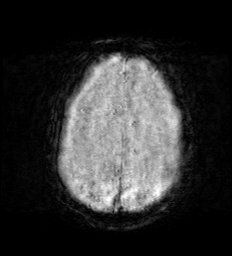
[im 52/52]
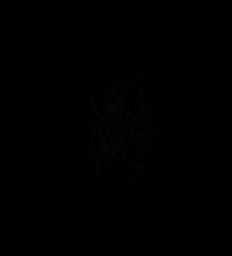

[Series 12: pha_images · axial · 3.0mm · 0.90mm/px · z∈[-86,+48]mm · 5 of 45 slices shown]
[im 1/45]
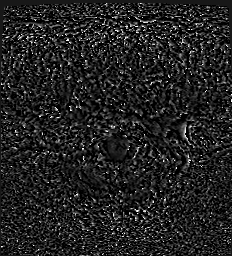
[im 12/45]
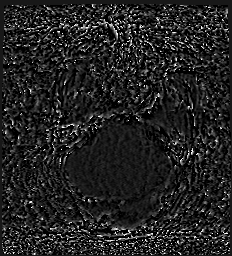
[im 23/45]
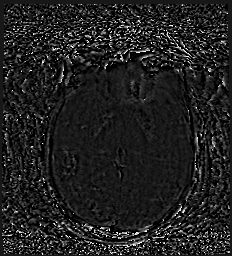
[im 34/45]
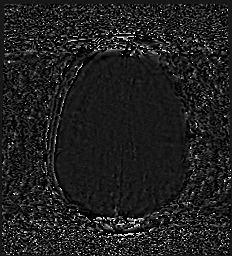
[im 45/45]
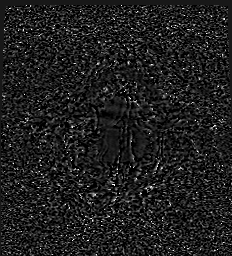

[Series 13: swi_images · axial · 3.0mm · 0.90mm/px · z∈[-92,+60]mm · 5 of 52 slices shown]
[im 1/52]
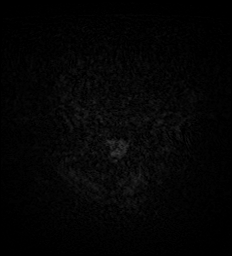
[im 13/52]
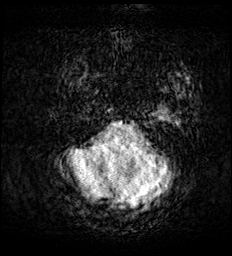
[im 26/52]
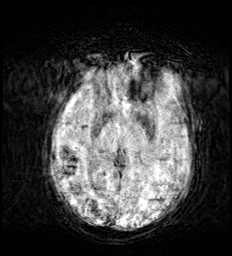
[im 39/52]
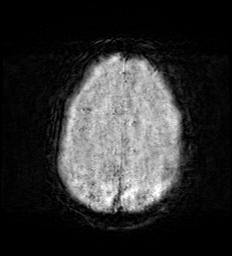
[im 52/52]
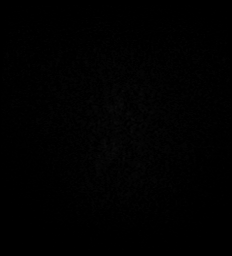

[Series 15: FLAIR · axial · 5.0mm · 1.20mm/px · z∈[-93,+62]mm · 3 of 27 slices shown]
[im 1/27]
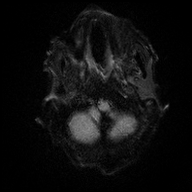
[im 14/27]
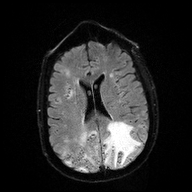
[im 27/27]
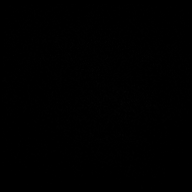

[Series 16: T1 · axial · 5.0mm · 0.90mm/px · z∈[-93,+44]mm · 3 of 24 slices shown (2 of 2)]
[im 1/24]
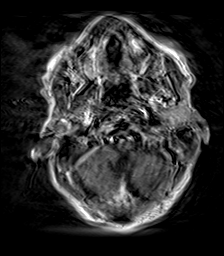
[im 12/24]
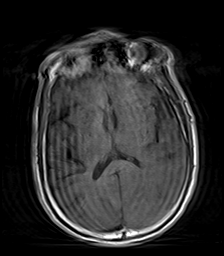
[im 24/24]
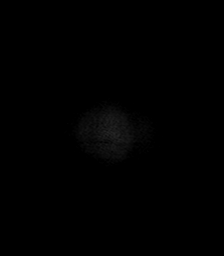

[Series 17: T2 · coronal · 5.0mm · 0.45mm/px · 3 of 31 slices shown (2 of 2)]
[im 1/31]
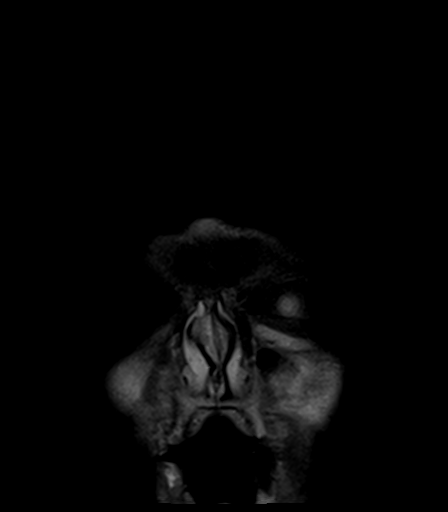
[im 16/31]
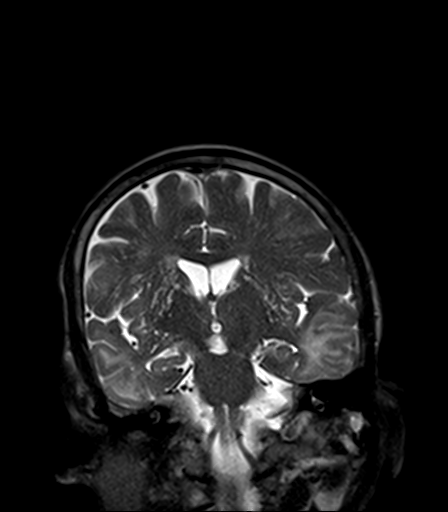
[im 31/31]
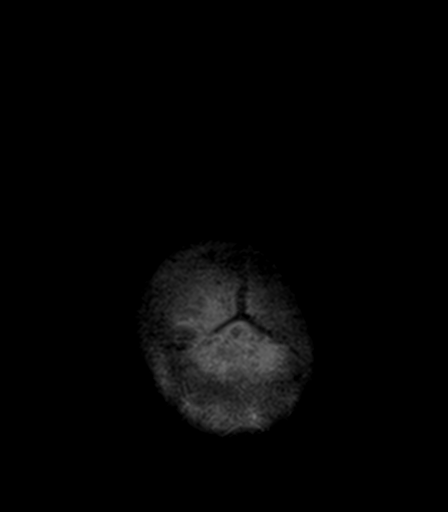

[48 of 48 positions shown; findings below may reference images not displayed]

FINDINGS: Motion artifact is present.

Brain: There is T2 FLAIR hyperintensity in the parietal, occipital,
and temporal lobes involving the white matter. Cortex appears to be
spared. There is no associated reduced diffusion. Corresponding
susceptibility is present likely reflecting areas of extensive
microhemorrhage.

Mild mass effect is present with effacement of the posterior left
lateral ventricle. No extra-axial collection. Additional areas of T2
hyperintensity in the supratentorial white matter may reflect
chronic microvascular ischemic changes.

Vascular: Major vessel flow voids at the skull base are preserved.

Skull and upper cervical spine: Normal marrow signal is preserved.

Sinuses/Orbits: Paranasal sinuses are aerated. Orbits are
unremarkable.

Other: Sella is unremarkable. Patchy left mastoid fluid
opacification.
IMPRESSION: Motion degraded study.

Bilateral parietal, occipital, and temporal edema with extensive
microhemorrhage. Minor mass effect. Favor posterior reversal
encephalopathy syndrome with hemorrhage.

## 2021-06-17 MED ORDER — CLEVIDIPINE BUTYRATE 0.5 MG/ML IV EMUL
0.0000 mg/h | INTRAVENOUS | Status: DC
  Administered 2021-06-17: 1 mg/h via INTRAVENOUS
  Filled 2021-06-17: qty 50

## 2021-06-17 MED ORDER — HYDRALAZINE HCL 20 MG/ML IJ SOLN: 10.0000 mg | Freq: Four times a day (QID) | INTRAMUSCULAR | Status: DC | PRN

## 2021-06-17 MED ORDER — HALOPERIDOL LACTATE 5 MG/ML IJ SOLN
4.0000 mg | Freq: Four times a day (QID) | INTRAMUSCULAR | Status: DC | PRN
  Administered 2021-06-17 – 2021-06-18 (×3): 4 mg via INTRAVENOUS
  Filled 2021-06-17 (×4): qty 1

## 2021-06-17 MED ORDER — THIAMINE HCL 100 MG/ML IJ SOLN: 500.0000 mg | Freq: Every day | INTRAMUSCULAR | Status: DC

## 2021-06-17 MED ORDER — METOPROLOL TARTRATE 5 MG/5ML IV SOLN
5.0000 mg | Freq: Three times a day (TID) | INTRAVENOUS | Status: DC
  Administered 2021-06-17 – 2021-06-18 (×2): 5 mg via INTRAVENOUS
  Filled 2021-06-17 (×2): qty 5

## 2021-06-17 MED ORDER — LACTATED RINGERS IV SOLN: INTRAVENOUS | Status: DC

## 2021-06-17 MED ORDER — HYDRALAZINE HCL 20 MG/ML IJ SOLN
10.0000 mg | INTRAMUSCULAR | Status: DC | PRN
  Administered 2021-06-18: 10 mg via INTRAVENOUS
  Filled 2021-06-17: qty 1

## 2021-06-17 MED ORDER — THIAMINE HCL 100 MG/ML IJ SOLN
500.0000 mg | Freq: Every day | INTRAVENOUS | Status: DC
  Administered 2021-06-17: 500 mg via INTRAVENOUS
  Filled 2021-06-17 (×2): qty 5

## 2021-06-17 MED ORDER — METOPROLOL TARTRATE 5 MG/5ML IV SOLN
5.0000 mg | Freq: Three times a day (TID) | INTRAVENOUS | Status: DC
  Administered 2021-06-17: 5 mg via INTRAVENOUS
  Filled 2021-06-17: qty 5

## 2021-06-17 NOTE — ED Notes (Signed)
Patient extremely agitated; attempting to unsafely exit stretcher; grabbing at things in the air above bed; removing clothing; pulling and IV tubing and monitoring equipment; not able to follow commands

## 2021-06-17 NOTE — Procedures (Signed)
Patient Name: [name]  MRN: [id number]  Epilepsy Attending: Lora Havens  Referring Physician/Provider: Dr Oren Binet Date: 06/17/2021 Duration: 23.53 mins  Patient history:64 y.o. male cirrhosis, EtOH use, prior history of cocaine use-who presented with sudden onset of altered mental status-found to have PRES on neuroimaging. EEG to evaluate for seizure.  Level of alertness: Awake  AEDs during EEG study: Ativan  Technical aspects: This EEG study was done with scalp electrodes positioned according to the 10-20 International system of electrode placement. Electrical activity was acquired at a sampling rate of '500Hz'$  and reviewed with a high frequency filter of '70Hz'$  and a low frequency filter of '1Hz'$ . EEG data were recorded continuously and digitally stored.   Description: EEG showed continuous generalized 3 to 6 Hz theta-delta slowing with an excessive amount of 15 to 18 Hz beta activity distributed symmetrically and diffusely. Hyperventilation and photic stimulation were not performed.     ABNORMALITY - Continuous slow, generalized - Excessive beta, generalized  IMPRESSION: This study is suggestive of moderate diffuse encephalopathy, nonspecific etiology. The excessive beta activity seen in the background is most likely due to the effect of benzodiazepine and is a benign EEG pattern. No seizures or epileptiform discharges were seen throughout the recording.  Kaetlin Bullen Barbra Sarks

## 2021-06-17 NOTE — Consult Note (Addendum)
NEURO HOSPITALIST CONSULT NOTE   Requestig physician: Dr. Sloan Leiter  Reason for Consult: AMS  History obtained from:  Daughter and Chart     HPI:                                                                                                                                          Mark Carr is an 65 y.o. male with a PMHx of liver cirrhosis, smoking and cocaine abuse who presented to the ED yesterday afternoon with AMS of sudden onset since 20. He had been in his USOH the day before as well as on Monday morning. In the afternoon he abruptly started clutching his chest, indicating that he was in pain, and was also encephalopathic and swinging at a daughter. Daughter also felt that he could not see well. He was hypertensive per EMS and combative on arrival. Exam by EDP revealed an agitated, groaning patient trying to get out of bed, with symmetrically reactive non-dilated pupils, moving all extremities but not following commands and not speaking. Family felt that he might have drank some EtOH the day before, but they did not suspect any cocaine use, which he has been abstaining from for the past 3 months.   Of note, the patient had been attempting to significantly reduce his alcohol consumption over the last few weeks, with daughter's best recollection being that he was recently down to 4 beers per day over the course of the last 1-2 weeks. His baseline EtOH consumption was 12-24 beers daily prior to that.   The patient recently was hospitalized in June for Covid-PNA.   Initial DDx included metabolic encephalopathy, acute CNS event, intoxication and decompensated cirrhosis. His CT came back concerning for PRES but he was not very hypertensive per EDP note. EtOH level was undetectable and UDS was positive only for the benzos that had been administered in the ED. His agitation had required administration of haloperidol and lorazepam.   MRI brain obtained today revealed  findings most consistent with severe PRES.   Past Medical History:  Diagnosis Date   Cirrhosis of liver ([place])    Wrist fracture     Past Surgical History:  Procedure Laterality Date   WRIST FRACTURE SURGERY Right 2016    Family History  Problem Relation Age of Onset   Hypertension Mother    Deep vein thrombosis Mother    Cancer Sister        unknown type   Heart disease Brother    Cancer Maternal Grandmother             Social History:  reports that he has been smoking cigarettes. He has a 20.00 pack-year smoking history. He has never used smokeless tobacco. He reports current alcohol use of about 4.0 standard drinks  of alcohol per week. He reports previous drug use.  No Known Allergies  MEDICATIONS:                                                                                                                     No current facility-administered medications on file prior to encounter.   Current Outpatient Medications on File Prior to Encounter  Medication Sig Dispense Refill   tadalafil (CIALIS) 20 MG tablet Take 1 tablet (20 mg total) by mouth every three (3) days as needed for erectile dysfunction. 20 tablet 2    Scheduled:  folic acid  1 mg Oral Daily   metoprolol tartrate  5 mg Intravenous Q8H   multivitamin with minerals  1 tablet Oral Daily   Continuous:  lactated ringers 50 mL/hr at 06/17/21 1421   thiamine injection Stopped (06/17/21 1409)     ROS:                                                                                                                                       Unable to obtain due to severe encephalopathy.    Blood pressure (!) 147/85, pulse 97, temperature 97.7 F (36.5 C), temperature source Axillary, resp. rate 20, SpO2 97 %.   General Examination:                                                                                                       Physical Exam  HEENT-  [place]/AT    Lungs- Respirations unlabored Extremities- No  edema   Neurological Examination Mental Status: Obtunded to somnolent. Will briefly open eyes to sustained verbal and tactile stimuli. Attempts to answer questions but the only intelligible responses were stating "4 fingers" when shown five and saying "I want to sleep" when awakened. Unable to name his nose, his ear or a glove. 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 He also does not gaze directly  in the direction of his daughter when she shows him her face and is asked to identify her. Unable to test for orientation due to obtundation/somnolence.  Cranial Nerves: II: Does not fixate directly on visual stimuli. Does not track. PERRL.Required continuous stimulation and passive eyelid opening to attempt vision assessment; did not blink when hands brought close to eyes.  III,IV, VI: Briefly opened eyes with no gross ptosis seen. Eyes are conjugate near the midline but does not track. No nystagmus.  V: Reacts to eyelid stimulation bilaterally.  VII: Face grossly symmetric with brief grimace when asked to smile.  VIII: Hearing intact to some questions and commands IX,X: Unable to visualize palate XI: Noncooperative with testing XII: Does not protrude tongue to command Motor: Increased tone in upper and lower extremities to passive movement. No gross asymmetry. Initially lying in bed on his back. Later patient shifted himself to lying in bed on his left side with BUE flexed and abducted in fetal-like posture. Also with fetal-like posturing of BLE at hips and knees.  Did not cooperate with testing of strength. He showed initial signs of combativeness, swatting at examiner with his RUE.  Sensory: Reacts to tactile stimulation in BUE. Became combative at which time examiner deferred noxious plantar stimulation.  Deep Tendon Reflexes: Unable to assess due to patient becoming agitated.  Cerebellar: Patient non-cooperative.   Gait: Unable to assess   Lab Results: Basic Metabolic Panel: Recent Labs  Lab 06/16/21 1628 06/17/21 0335  NA 139 139  K 3.7 3.8  CL 108 108  CO2 16* 23  GLUCOSE 123* 109*  BUN 10 10  CREATININE 0.93 0.72  CALCIUM 9.3 8.7*  MG 2.3 1.9  PHOS  --  2.5    CBC: Recent Labs  Lab 06/16/21 1628 06/17/21 0335  WBC 7.2 6.2  NEUTROABS 2.6  --   HGB 13.6 12.9*  HCT 39.8 36.9*  MCV 97.1 94.6  PLT 161 133*    Cardiac Enzymes: Recent Labs  Lab 06/16/21 1628  CKTOTAL 112    Lipid Panel: No results for input(s): CHOL, TRIG, HDL, CHOLHDL, VLDL, LDLCALC in the last 168 hours.  Imaging: CT HEAD WO CONTRAST (5MM)  Result Date: 06/16/2021 CLINICAL DATA:  Mental status change, unknown cause EXAM: CT HEAD WITHOUT CONTRAST TECHNIQUE: Contiguous axial images were obtained from the base of the skull through the vertex without intravenous contrast. COMPARISON:  04/01/2021 FINDINGS: Brain: There is low-density within the white matter of the posterior cerebral hemispheres bilaterally, left greater than right. Favor PRES. No hemorrhage or hydrocephalus. Vascular: No hyperdense vessel or unexpected calcification. Skull: No acute calvarial abnormality. Sinuses/Orbits: No acute findings Other: None IMPRESSION: Low-density throughout the white matter of the posterior cerebral hemispheres bilaterally most pronounced in the left occipital lobe. Favor PRES. No definite cortical infarct, hemorrhage or hydrocephalus. This could be further evaluated with MRI if felt clinically indicated. Electronically Signed   By: Rolm Baptise M.D.   On: 06/16/2021 17:18   MR BRAIN WO CONTRAST  Result Date: 06/17/2021 CLINICAL DATA:  Mental status change, unknown cause EXAM: MRI HEAD WITHOUT CONTRAST TECHNIQUE: Multiplanar, multiecho pulse sequences of the brain and surrounding structures were obtained without intravenous contrast. COMPARISON:  Correlation made with CT head 06/16/2021 FINDINGS: Motion artifact is present.  Brain: There is T2 FLAIR hyperintensity in the parietal, occipital, and temporal lobes involving the white matter. Cortex appears to be spared. There is no associated reduced diffusion. Corresponding susceptibility is present likely reflecting areas of extensive microhemorrhage. Mild  mass effect is present with effacement of the posterior left lateral ventricle. No extra-axial collection. Additional areas of T2 hyperintensity in the supratentorial white matter may reflect chronic microvascular ischemic changes. Vascular: Major vessel flow voids at the skull base are preserved. Skull and upper cervical spine: Normal marrow signal is preserved. Sinuses/Orbits: Paranasal sinuses are aerated. Orbits are unremarkable. Other: Sella is unremarkable. Patchy left mastoid fluid opacification. IMPRESSION: Motion degraded study. Bilateral parietal, occipital, and temporal edema with extensive microhemorrhage. Minor mass effect. 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 Electronically Signed   By: Macy Mis M.D.   On: 06/17/2021 10:33   DG Chest Port 1 View  Result Date: 06/16/2021 CLINICAL DATA:  Acute encephalopathy EXAM: PORTABLE CHEST 1 VIEW COMPARISON:  The 04/19/2021 FINDINGS: Left base atelectasis. Right lung clear. No effusions. Heart is borderline in size. No acute bony abnormality. IMPRESSION: Left base atelectasis. Electronically Signed   By: Rolm Baptise M.D.   On: 06/16/2021 21:52     Assessment: 65 year old male presenting with confusion, agitation and severe HTN. MRI reveals findings most consistent with severe PRES.  1. Exam reveals an obtunded/somnolent patient with findings suggestive of cortical blindness.  2. CT head:  Low-density throughout the white matter of the posterior cerebral hemispheres bilaterally most pronounced in the left occipital lobe. Favor PRES. No definite cortical infarct, hemorrhage or hydrocephalus.  3. MRI brain: Bilateral parietal, occipital, and  temporal edema with extensive microhemorrhage. Minor mass effect. Cortex appears to be spared. There is no associated reduced diffusion. 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 4. SBP over the past 3 hours in the ED ranging from 130's to 170's with most readings above 150. BP is not consistently controlled with PRN hydralazine.  5. Only medication at home is Cialis. No reports documenting an association of this medication with PRES could be found on brief search of the literature.   6. EEG: Continuous slow, generalized. Excessive beta, generalized. This study is suggestive of moderate diffuse encephalopathy, nonspecific etiology. The excessive beta activity seen in the background is most likely due to the effect of benzodiazepine. No seizures or epileptiform discharges were seen throughout the recording. 7. A bump up in his liver enzymes this admission relative to June suggests that he may in fact have been surreptitiously drinking more than usual. This, coupled with his undetectable EtOH level on presentation suggests possible EtOH withdrawal syndrome leading to hypertensive crisis and hypertensive encephalopathy/PRES.   Recommendations: 1. Aggressive BP management. Recommend lowering current SBP goal of < 150 to a new SBP goal of 120-140. Will need clevidipine or nicardipine drip as PRN hydralazine with scheduled IV metoprolol have not been effective at consistently controlling BP in the ED.  2. Frequent neuro checks 3. Most likely will need an ICU bed.  4. Neurology will continue to follow.  5. Will need a follow up MRI brain in 7 days to assess for degree of improvement versus possible irreversible changes. May need a repeat MRI sooner if he deteriorates.   45 minutes spent in the neurological evaluation and management of this critically ill patient.   Electronically signed: Dr. Kerney Elbe 06/17/2021, 12:49 PM

## 2021-06-17 NOTE — ED Notes (Signed)
Bill RN aware of assigned bed 

## 2021-06-17 NOTE — Progress Notes (Addendum)
PROGRESS NOTE        PATIENT DETAILS Name: Mark Carr Age: 65 y.o. Sex: male Date of Birth: 1956-08-07 Admit Date: 06/16/2021 Admitting Physician Rhetta Mura, DO [id number], Dionne Bucy, MD  Brief Narrative: Patient is a 65 y.o. male cirrhosis, EtOH use, prior history of cocaine use-who presented with sudden onset of altered mental status-found to have PRES on neuroimaging.  Significant events: 8/8>> admit for confusion-PRES on MRI brain.  Significant studies: 8/8>> UDS: Positive for benzodiazepines 8/8>> CT head: Low density throughout the white matter in the posterior cerebral hemispheres-likely PRES. 8/9>> MRI brain: Bilateral parietal/occipital/temporal edema with extensive microhemorrhage  Antimicrobial therapy: None  Microbiology data: None  Procedures : None  Consults: Neurology.  DVT Prophylaxis : SCDs Start: 06/16/21 1906   Subjective: Confused-agitated-when calmed-tries to respond.  Does not blink to startle response.  Assessment/Plan: Acute encephalopathy likely due to PRES: BP reasonable this morning-but per daughter-when EMS arrived at the house yesterday-patient's blood pressure was more than [id number] systolic.  He does not have a documented history of hypertension per family.  Continue supportive care-have added as needed IV hydralazine if his blood pressure starts creeping up-check EEG-empirically on high-dose thiamine for a few days-we will await recommendations from neurology.  Likely undiagnosed HTN: Currently confused-once bit more awake and alert we will start oral antihypertensives.  Blood pressure in the [id number] systolic range this morning.  In the meantime we will start scheduled IV Lopressor to see if we can get his blood pressure down a bit further.  Continue as needed IV hydralazine if systolic more than 0000000.  History of alcohol use: Per daughter at bedside-patient has slowly cut down-drinks around 2 small  bottles of beer on a daily basis for the past 1 month.  His altered mental status changes was sudden-and likely not consistent to alcohol withdrawal physiology.  Watch for withdrawal symptoms-on Ativan per CIWA protocol.  Reported history of liver cirrhosis-probably due to alcohol use: Compensated for now  Tobacco use disorder: Counseled when more awake and alert  History of cocaine use: Per family-patient has not done cocaine in the past several months-UDS without cocaine.  Recent COVID-19 infection: Currently asymptomatic-does not require any further isolation.  Diet: Diet Order             Diet NPO time specified  Diet effective now                    Code Status: Full code   Family Communication: Daughter at bedside  Disposition Plan: Status is: Inpatient  Remains inpatient appropriate because:Inpatient level of care appropriate due to severity of illness  Dispo: The patient is from: Home              Anticipated d/c is to: Home              Patient currently is not medically stable to d/c.   Difficult to place patient No   Barriers to Discharge: Still with significant encephalopathy-not yet stable.  Antimicrobial agents: Anti-infectives (From admission, onward)    None       Time spent: 35 minutes-Greater than 50% of this time was spent in counseling, explanation of diagnosis, planning of further management, and coordination of care.  MEDICATIONS: Scheduled Meds:  folic acid  1 mg Oral Daily   multivitamin with minerals  1  tablet Oral Daily   Continuous Infusions:  thiamine injection     PRN Meds:.acetaminophen **OR** acetaminophen, haloperidol lactate, LORazepam, LORazepam **OR** LORazepam   PHYSICAL EXAM: Vital signs: Vitals:   06/17/21 0430 06/17/21 0500 06/17/21 0530 06/17/21 0600  BP: (!) 162/80 (!) 155/61 (!) 161/76 (!) 156/77  Pulse: 88 (!) 102 95 95  Resp:    17  Temp:      TempSrc:      SpO2: 97% 95% 94% 98%   There were no  vitals filed for this visit. There is no height or weight on file to calculate BMI.   Gen Exam: Confused-tries to talk-able to speak 1-2 words-no startle reflex. HEENT:atraumatic, normocephalic Chest: B/L clear to auscultation anteriorly CVS:S1S2 regular Abdomen:soft non tender, non distended Extremities: No edema. Neurology: Moving all 4 extremities-does not appear to have a focal neurological deficits. Skin: no rash  I have personally reviewed following labs and imaging studies  LABORATORY DATA: CBC: Recent Labs  Lab 06/16/21 1628 06/17/21 0335  WBC 7.2 6.2  NEUTROABS 2.6  --   HGB 13.6 12.9*  HCT 39.8 36.9*  MCV 97.1 94.6  PLT 161 133*    Basic Metabolic Panel: Recent Labs  Lab 06/16/21 1628 06/17/21 0335  NA 139 139  K 3.7 3.8  CL 108 108  CO2 16* 23  GLUCOSE 123* 109*  BUN 10 10  CREATININE 0.93 0.72  CALCIUM 9.3 8.7*  MG 2.3 1.9  PHOS  --  2.5    GFR: CrCl cannot be calculated (Unknown ideal weight.).  Liver Function Tests: Recent Labs  Lab 06/16/21 1628 06/17/21 0335  AST 42* 43*  ALT 21 18  ALKPHOS 93 75  BILITOT 0.8 1.4*  PROT 8.2* 7.5  ALBUMIN 4.2 3.8   No results for input(s): LIPASE, AMYLASE in the last 168 hours. Recent Labs  Lab 06/17/21 0335  AMMONIA 10    Coagulation Profile: Recent Labs  Lab 06/16/21 1628 06/17/21 0335  INR 1.2 1.2    Cardiac Enzymes: Recent Labs  Lab 06/16/21 1628  CKTOTAL 112    BNP (last 3 results) No results for input(s): PROBNP in the last 8760 hours.  Lipid Profile: No results for input(s): CHOL, HDL, LDLCALC, TRIG, CHOLHDL, LDLDIRECT in the last 72 hours.  Thyroid Function Tests: Recent Labs    06/16/21 1628  TSH 3.088    Anemia Panel: No results for input(s): VITAMINB12, FOLATE, FERRITIN, TIBC, IRON, RETICCTPCT in the last 72 hours.  Urine analysis:    Component Value Date/Time   COLORURINE YELLOW (A) 04/19/2021 0604   APPEARANCEUR CLEAR (A) 04/19/2021 0604   LABSPEC 1.010  04/19/2021 0604   PHURINE 7.0 04/19/2021 0604   GLUCOSEU NEGATIVE 04/19/2021 0604   HGBUR SMALL (A) 04/19/2021 0604   BILIRUBINUR NEGATIVE 04/19/2021 0604   KETONESUR NEGATIVE 04/19/2021 0604   PROTEINUR NEGATIVE 04/19/2021 0604   NITRITE NEGATIVE 04/19/2021 0604   LEUKOCYTESUR MODERATE (A) 04/19/2021 0604    Sepsis Labs: Lactic Acid, Venous    Component Value Date/Time   LATICACIDVEN 1.9 06/16/2021 2216    MICROBIOLOGY: No results found for this or any previous visit (from the past 240 hour(s)).  RADIOLOGY STUDIES/RESULTS: CT HEAD WO CONTRAST (5MM)  Result Date: 06/16/2021 CLINICAL DATA:  Mental status change, unknown cause EXAM: CT HEAD WITHOUT CONTRAST TECHNIQUE: Contiguous axial images were obtained from the base of the skull through the vertex without intravenous contrast. COMPARISON:  04/01/2021 FINDINGS: Brain: There is low-density within the white matter of the posterior  cerebral hemispheres bilaterally, left greater than right. Favor PRES. No hemorrhage or hydrocephalus. Vascular: No hyperdense vessel or unexpected calcification. Skull: No acute calvarial abnormality. Sinuses/Orbits: No acute findings Other: None IMPRESSION: Low-density throughout the white matter of the posterior cerebral hemispheres bilaterally most pronounced in the left occipital lobe. Favor PRES. No definite cortical infarct, hemorrhage or hydrocephalus. This could be further evaluated with MRI if felt clinically indicated. Electronically Signed   By: Rolm Baptise M.D.   On: 06/16/2021 17:18   MR BRAIN WO CONTRAST  Result Date: 06/17/2021 CLINICAL DATA:  Mental status change, unknown cause EXAM: MRI HEAD WITHOUT CONTRAST TECHNIQUE: Multiplanar, multiecho pulse sequences of the brain and surrounding structures were obtained without intravenous contrast. COMPARISON:  Correlation made with CT head 06/16/2021 FINDINGS: Motion artifact is present. Brain: There is T2 FLAIR hyperintensity in the parietal, occipital,  and temporal lobes involving the white matter. Cortex appears to be spared. There is no associated reduced diffusion. Corresponding susceptibility is present likely reflecting areas of extensive microhemorrhage. Mild mass effect is present with effacement of the posterior left lateral ventricle. No extra-axial collection. Additional areas of T2 hyperintensity in the supratentorial white matter may reflect chronic microvascular ischemic changes. Vascular: Major vessel flow voids at the skull base are preserved. Skull and upper cervical spine: Normal marrow signal is preserved. Sinuses/Orbits: Paranasal sinuses are aerated. Orbits are unremarkable. Other: Sella is unremarkable. Patchy left mastoid fluid opacification. IMPRESSION: Motion degraded study. Bilateral parietal, occipital, and temporal edema with extensive microhemorrhage. Minor mass effect. 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 Electronically Signed   By: Macy Mis M.D.   On: 06/17/2021 10:33   DG Chest Port 1 View  Result Date: 06/16/2021 CLINICAL DATA:  Acute encephalopathy EXAM: PORTABLE CHEST 1 VIEW COMPARISON:  The 04/19/2021 FINDINGS: Left base atelectasis. Right lung clear. No effusions. Heart is borderline in size. No acute bony abnormality. IMPRESSION: Left base atelectasis. Electronically Signed   By: Rolm Baptise M.D.   On: 06/16/2021 21:52     LOS: 1 day   Oren Binet, MD  [facility]    To contact the attending provider between 7A-7P or the covering provider during after hours 7P-7A, please log into the web site [email or web address] and access using universal [place] password for that web site. If you do not have the password, please call the hospital operator.  06/17/2021, 12:23 PM

## 2021-06-17 NOTE — Progress Notes (Signed)
OT Cancellation Note  Patient Details Name: Mark Carr MRN: [id number] DOB: November 22, 1955   Cancelled Treatment:    Reason Eval/Treat Not Completed: Patient not medically ready. Consult received, chart reviewed. Per RN, pt too agitated, not appropriate for therapy today. Will re-attempt OT evaluation next date as medically appropriate.  Hanley Hays, MPH, MS, OTR/L ascom [phone] 06/17/21, 1:19 PM

## 2021-06-17 NOTE — Progress Notes (Signed)
Eeg done 

## 2021-06-17 NOTE — Progress Notes (Signed)
PT Cancellation Note  Patient Details Name: NICLAS NAJJAR MRN: [id number] DOB: 06-28-1956   Cancelled Treatment:    Reason Eval/Treat Not Completed: Other (comment). Consult received, chart reviewed. Per RN, pt too agitated, not appropriate for therapy today. Will re-attempt PT evaluation next date as medically appropriate.   Yogesh Cominsky 06/17/2021, 2:45 PM Greggory Stallion, PT, DPT [phone]

## 2021-06-17 NOTE — ED Notes (Signed)
Patient had episode of urine incontinence; new gown, linen; adult brief applied

## 2021-06-17 NOTE — Progress Notes (Signed)
  Chaplain On-call received a phone call from [employer] at [phone], who stated that the patient's daughter has questions regarding [place] documents.  Chaplain spoke by phone with daughter Brandell Sakai. [name] stated that she and her sister desire that the Advance Directives documents be completed for the patient. However, she described mental status changes that are happening for the patient.  Chaplain advised [name] that the patient needs to have the capacity to understand and to sign the documents himself. Chaplain also advised waiting until the patient is admitted to the hospital to see if his cognitive condition improves.  Chaplain later visited with [facility] while the patient was receiving an MRI procedure. Chaplain provided spiritual and emotional support.  Chaplain Pollyann Samples M.Div., [facility]

## 2021-06-17 NOTE — Progress Notes (Signed)
Chart reviewed in anticipation of transfer to 2A, transfer cancelled.

## 2021-06-17 NOTE — ED Notes (Signed)
Pt was getting agitated trying to take his IV out and mittens off. halidol given.

## 2021-06-18 LAB — CBC
HCT: 38.4 % — ABNORMAL LOW (ref 39.0–52.0)
Hemoglobin: 13.8 g/dL (ref 13.0–17.0)
MCH: 34.1 pg — ABNORMAL HIGH (ref 26.0–34.0)
MCHC: 35.9 g/dL (ref 30.0–36.0)
MCV: 94.8 fL (ref 80.0–100.0)
Platelets: 136 10*3/uL — ABNORMAL LOW (ref 150–400)
RBC: 4.05 MIL/uL — ABNORMAL LOW (ref 4.22–5.81)
RDW: 13.2 % (ref 11.5–15.5)
WBC: 5.9 10*3/uL (ref 4.0–10.5)
nRBC: 0 % (ref 0.0–0.2)

## 2021-06-18 LAB — BASIC METABOLIC PANEL
Anion gap: 7 (ref 5–15)
BUN: 9 mg/dL (ref 8–23)
CO2: 23 mmol/L (ref 22–32)
Calcium: 9.3 mg/dL (ref 8.9–10.3)
Chloride: 108 mmol/L (ref 98–111)
Creatinine, Ser: 0.58 mg/dL — ABNORMAL LOW (ref 0.61–1.24)
GFR, Estimated: >60 mL/min (ref >60)
Glucose, Bld: 87 mg/dL (ref 70–99)
Potassium: 3.7 mmol/L (ref 3.5–5.1)
Sodium: 138 mmol/L (ref 135–145)

## 2021-06-18 LAB — MRSA NEXT GEN BY PCR, NASAL: MRSA by PCR Next Gen: NOT DETECTED

## 2021-06-18 MED ORDER — CHLORHEXIDINE GLUCONATE CLOTH 2 % EX PADS
6.0000 | MEDICATED_PAD | Freq: Every day | CUTANEOUS | Status: DC
  Administered 2021-06-18 – 2021-06-19 (×2): 6 via TOPICAL

## 2021-06-18 MED ORDER — THIAMINE HCL 100 MG/ML IJ SOLN
500.0000 mg | Freq: Every day | INTRAVENOUS | Status: AC
  Administered 2021-06-18 – 2021-06-19 (×2): 500 mg via INTRAVENOUS
  Filled 2021-06-18 (×2): qty 5

## 2021-06-18 MED ORDER — LABETALOL HCL 5 MG/ML IV SOLN: 10.0000 mg | INTRAVENOUS | Status: DC | PRN

## 2021-06-18 MED ORDER — THIAMINE HCL 100 MG/ML IJ SOLN
100.0000 mg | Freq: Every day | INTRAMUSCULAR | Status: DC
  Administered 2021-06-20: 100 mg via INTRAVENOUS
  Filled 2021-06-18: qty 2

## 2021-06-18 MED ORDER — LABETALOL HCL 200 MG PO TABS
200.0000 mg | ORAL_TABLET | Freq: Two times a day (BID) | ORAL | Status: DC
  Administered 2021-06-18 – 2021-06-20 (×5): 200 mg via ORAL
  Filled 2021-06-18 (×7): qty 1

## 2021-06-18 MED ORDER — AMLODIPINE BESYLATE 5 MG PO TABS
5.0000 mg | ORAL_TABLET | Freq: Every day | ORAL | Status: DC
  Administered 2021-06-18 – 2021-06-20 (×3): 5 mg via ORAL
  Filled 2021-06-18 (×3): qty 1

## 2021-06-18 NOTE — ED Notes (Signed)
Pt has been attempting to get OOB and pull off BP cuff and mitts. Pt pulled out multiple IVs last shift. CN informed. Telesitter initiated.

## 2021-06-18 NOTE — ED Notes (Signed)
Daughter at bedside.  Pt sleeping soundly. VS WNL.

## 2021-06-18 NOTE — ED Notes (Signed)
Pt sleeping. Will give AM meds once awake. Telesitter in place.

## 2021-06-18 NOTE — Evaluation (Signed)
Physical Therapy Evaluation Patient Details Name: Mark Carr MRN: [id number] DOB: [birth year] Today's Date: 06/18/2021   History of Present Illness  Pt is a 65 y/o M with PMH: liver cirrhosis, alcohol abuse, tobacco abuse, prior history of cocaine use-who presented with sudden onset of altered mental status. 8/8: CT head shows low density throughout the white matter in the posterior cerebral hemispheres-likely PRES.  8/9:  MRI brain shows Bilateral parietal/occipital/temporal edema with extensive microhemorrhage8/10: EEG demos diffuse encephalopathy, nonspecific etiology, no seizure or epiletiform discharges.  Clinical Impression  Pt is a pleasant 65 year old 65 who was admitted for PRES. Pt performs bed mobility with max assist +2. Not safe for further transfers/ambulation at this time. Pt demonstrates deficits with strength/mobility/cognition. Would benefit from skilled PT to address above deficits and promote optimal return to PLOF; recommend transition to STR upon discharge from acute hospitalization.     Follow Up Recommendations SNF (may progress quickly pending cognition)    Equipment Recommendations  Rolling walker with 5" wheels    Recommendations for Other Services       Precautions / Restrictions Precautions Precautions: Fall Restrictions Weight Bearing Restrictions: No      Mobility  Bed Mobility Overal bed mobility: Needs Assistance Bed Mobility: Supine to Sit     Supine to sit: Max assist;+2 for physical assistance;+2 for safety/equipment     General bed mobility comments: needs therapist to initiate mobility effort. ONce seated at EOB, able to decrease physical assist to cga +2 for safety. Able to sit for prolonged time, becomes fatigued. Forward flexed posture. Assist to return supine    Transfers                 General transfer comment: not safe due to lethargy/orientation this date  Ambulation/Gait                Stairs             Wheelchair Mobility    Modified Rankin (Stroke Patients Only)       Balance Overall balance assessment: Needs assistance Sitting-balance support: Feet supported;Bilateral upper extremity supported Sitting balance-Leahy Scale: Fair                                       Pertinent Vitals/Pain Pain Assessment: Faces Faces Pain Scale: Hurts little more Pain Location: ant chest-vague Pain Descriptors / Indicators: Discomfort Pain Intervention(s): Limited activity within patient's tolerance;Repositioned    Home Living Family/patient expects to be discharged to:: Private residence Living Arrangements: Spouse/significant other;Children (son) Available Help at Discharge: Family Type of Home: House Home Access: Stairs to enter Entrance Stairs-Rails: None Technical brewer of Steps: 2 Home Layout: One level Home Equipment: None Additional Comments: majority of history obtained from wife in room as pt is poor historian    Prior Function Level of Independence: Independent         Comments: Pt is poor historian and wife gives majority of history. Reports living at home with family, indep prior with no falls history. Currently working as Horticulturist, commercial. Does endorce to alcohol abuse; recently has been decreasing alcohol intake.     Hand Dominance        Extremity/Trunk Assessment   Upper Extremity Assessment Upper Extremity Assessment: Difficult to assess due to impaired cognition (spontaneous movement noted, however unable to formally test; dysmetria noted)    Lower Extremity Assessment Lower Extremity Assessment:  Generalized weakness;Difficult to assess due to impaired cognition (B LE grossly 3/5)       Communication   Communication: No difficulties  Cognition Arousal/Alertness: Lethargic Behavior During Therapy: Restless Overall Cognitive Status: Impaired/Different from baseline                                 General Comments: pt  alert to self, reports 1962 as date. Sleepy, opens eyes briefly to command. Follows ~25% of commands      General Comments      Exercises Other Exercises Other Exercises: B LE LAQ ~ 3 reps. Unable to formally complete HEP   Assessment/Plan    PT Assessment Patient needs continued PT services  PT Problem List Decreased strength;Decreased activity tolerance;Decreased balance;Decreased mobility;Decreased cognition;Decreased safety awareness       PT Treatment Interventions DME instruction;Gait training;Therapeutic activities;Therapeutic exercise;Balance training;Neuromuscular re-education;Cognitive remediation    PT Goals (Current goals can be found in the Care Plan section)  Acute Rehab PT Goals Patient Stated Goal: unable to state PT Goal Formulation: With family Time For Goal Achievement: 07/02/21 Potential to Achieve Goals: Good    Frequency 7X/week   Barriers to discharge        Co-evaluation PT/OT/SLP Co-Evaluation/Treatment: Yes Reason for Co-Treatment: Complexity of the patient's impairments (multi-system involvement);Necessary to address cognition/behavior during functional activity;To address functional/ADL transfers PT goals addressed during session: Mobility/safety with mobility OT goals addressed during session: ADL's and self-care       AM-PAC PT "6 Clicks" Mobility  Outcome Measure Help needed turning from your back to your side while in a flat bed without using bedrails?: A Lot Help needed moving from lying on your back to sitting on the side of a flat bed without using bedrails?: A Lot Help needed moving to and from a bed to a chair (including a wheelchair)?: Total Help needed standing up from a chair using your arms (e.g., wheelchair or bedside chair)?: Total Help needed to walk in hospital room?: Total Help needed climbing 3-5 steps with a railing? : Total 6 Click Score: 8    End of Session   Activity Tolerance: Patient limited by lethargy Patient  left: in bed;with family/visitor present Nurse Communication: Mobility status PT Visit Diagnosis: Muscle weakness (generalized) (M62.81);Difficulty in walking, not elsewhere classified (R26.2);Other symptoms and signs involving the nervous system (R29.898)    Time: [id number] PT Time Calculation (min) (ACUTE ONLY): 21 min   Charges:   PT Evaluation $PT Eval Low Complexity: 1 Low          Greggory Stallion, PT, DPT [phone]   Marieli Rudy 06/18/2021, 3:36 PM

## 2021-06-18 NOTE — Evaluation (Signed)
Clinical/Bedside Swallow Evaluation Patient Details  Name: Mark Carr MRN: [id number] Date of Birth: January 21, 1956  Today's Date: 06/18/2021 Time: SLP Start Time (ACUTE ONLY): 0825 SLP Stop Time (ACUTE ONLY): 0920 SLP Time Calculation (min) (ACUTE ONLY): 55 min  Past Medical History:  Past Medical History:  Diagnosis Date   Cirrhosis of liver ([place])    Wrist fracture    Past Surgical History:  Past Surgical History:  Procedure Laterality Date   WRIST FRACTURE SURGERY Right 2016   HPI:  Pt is a 65 y.o. male past medical history of cirrhosis, chronic alcohol use/abuse, malnutrition who presents with altered mental status. Acute encephalopathy unclear but w/ confusion and agitation possible d/t his recent reduction in his alcoholl consumption. Pt diagnosed with COVID-pneumonia in mid June 2022. CT head showed no evidence of acute infarct or intracranial hemorrhage, while showing low-density findings in the white matter of the posterior cerebral hemispheres raising possibility of PRES. Pt has a history of recreational drug use, including that of prior cocaine use.  CXR: Left base atelectasis.   Assessment / Plan / Recommendation Clinical Impression  Pt appears to present w/ primarily oral phase dysphagia in light of declined Cognitive status; encephalopathy. This can impact his overall awareness/mastication/timing of swallow and safety during po tasks which increases risk for aspiration, choking. Pt's risk for aspiration appears to be reduced when following general aspiration precautions, feeding support/supervision, and using a modified diet consistency w/ minced foods to aid oral phase management. Pt required MOD+ verbal/visual/tactile cues for during po tasks.       Pt consumed several trials of ice chips, purees, soft solids and thin and Nectar liquids w/ No overt clinical s/s of aspiration noted; no immediate coughing; no decline in vocal quality; no decline in respiratory status  during/post trials. Oral phase appeared adequate for bolus management and oral clearing of most boluses given; increased oral phase time and impaired mastication and bolus awareness noted w/ trials of solids(increased texture). Mastication of softened solids appeared more "munchy". Pt did not attempt to feed himself. OM Exam appeared grossly [facility] for all bolus management and oral clearing w/ No unilateral weakness noted. Some oral prep and OM task confusion.     D/t pt's declined Cognitive status w/ risk for aspiration, recommend initiation of the dysphagia level 2(minced foods) w/ thin liquids; general aspiration precautions; reduce Distractions during meals and engage pt during po's at meal for self-feeding. Pills Crushed in Puree for safer swallowing. Supervision and Support w/ feeding at meals as needed. ST services will follow for toleration of diet. MD/NSG updated. SLP Visit Diagnosis: Dysphagia, oral phase (R13.11) (Confusion)    Aspiration Risk  Mild aspiration risk;Risk for inadequate nutrition/hydration    Diet Recommendation  Dysphagia level 2(minced foods) w/ thin liquids; general aspiration precautions; reduce Distractions during meals and engage pt during po's at meal for self-feeding. Supervision and Support w/ feeding at meals as needed.  Medication Administration: Crushed with puree    Other  Recommendations Recommended Consults:  (Dietician f/u) Oral Care Recommendations: Oral care BID;Oral care before and after PO;Staff/trained caregiver to provide oral care Other Recommendations:  (n/a)   Follow up Recommendations None (TBD)      Frequency and Duration min 2x/week  1 week       Prognosis Prognosis for Safe Diet Advancement: Fair (-Good) Barriers to Reach Goals: Cognitive deficits;Language deficits;Time post onset;Severity of deficits;Behavior      Swallow Study   General Date of Onset: 06/16/21 HPI: Pt is  a 65 y.o. male past medical history of cirrhosis, chronic  alcohol use/abuse, malnutrition who presents with altered mental status. Acute encephalopathy unclear but w/ confusion and agitation possible d/t his recent reduction in his alcoholl consumption. Pt diagnosed with COVID-pneumonia in mid June 2022. CT head showed no evidence of acute infarct or intracranial hemorrhage, while showing low-density findings in the white matter of the posterior cerebral hemispheres raising possibility of PRES. Pt has a history of recreational drug use, including that of prior cocaine use.  CXR: Left base atelectasis. Type of Study: Bedside Swallow Evaluation Previous Swallow Assessment: none Diet Prior to this Study: NPO (unsure of previous diet) Temperature Spikes Noted: No (wbc 5.9) Respiratory Status: Room air History of Recent Intubation: No Behavior/Cognition: Cooperative;Confused;Impulsive;Distractible;Requires cueing (Awakened) Oral Cavity Assessment: Within Functional Limits (what could be viewed - limited view d/t confusion) Oral Care Completed by SLP: Yes (attempted) Oral Cavity - Dentition: Adequate natural dentition;Missing dentition (few missing posteriorly) Vision:  (n/a) Self-Feeding Abilities: Total assist Patient Positioning: Postural control adequate for testing (supported by towel rolls for a head forward position) Baseline Vocal Quality: Normal;Low vocal intensity (muttered/mumbled speech) Volitional Cough: Cognitively unable to elicit Volitional Swallow: Unable to elicit    Oral/Motor/Sensory Function Overall Oral Motor/Sensory Function:  (no unilateral weakness noted; labial closure on straw/spoon)   Ice Chips Ice chips: Within functional limits Presentation: Spoon (fed; 3 trials)   Thin Liquid Thin Liquid: Within functional limits Presentation: Straw (fed; 6 trials accepted)    Nectar Thick Nectar Thick Liquid: Within functional limits Presentation: Straw (fed; 3 trials)   Honey Thick Honey Thick Liquid: Not tested   Puree Puree: Within  functional limits Presentation: Spoon (fed; 8 trials) Other Comments: took small amounts from end of spoon   Solid     Solid: Impaired Presentation: Spoon (fed; 2 trials) Oral Phase Impairments: Poor awareness of bolus;Impaired mastication (munching patttern) Oral Phase Functional Implications: Impaired mastication Pharyngeal Phase Impairments:  (none)       Orinda Kenner, MS, [employer] Speech Language Pathologist Rehab Services [phone]  Shoua Ulloa 06/18/2021,10:04 AM

## 2021-06-18 NOTE — ED Notes (Signed)
Wife at bedside. Spoke with wife regarding pt status. Pt asleep. Will give PO meds once awake.

## 2021-06-18 NOTE — ED Notes (Signed)
Tried to call daughter to give update. Unable to reach daughter. No answer.

## 2021-06-18 NOTE — ED Notes (Signed)
Assisted pt to stand to void. Pt changed. Daughter at bedside. Pt following commands at this time. Daughter concerned that pt having chest pain. This nurse believes pt having positional muscle pain. EKG performed. NSR.

## 2021-06-18 NOTE — ED Notes (Signed)
Pt took iv out and blood pressure cuff off.

## 2021-06-18 NOTE — ED Notes (Signed)
Dr Tera Helper at bedside talking to wife regarding pt status and plan of care.

## 2021-06-18 NOTE — Evaluation (Signed)
Occupational Therapy Evaluation Patient Details Name: Mark Carr MRN: [id number] DOB: 05/08/1956 Today's Date: 06/18/2021    History of Present Illness Pt is a 65 y/o M with PMH: liver cirrhosis, alcohol abuse, tobacco abuse, prior history of cocaine use-who presented with sudden onset of altered mental status. 8/8: CT head shows low density throughout the white matter in the posterior cerebral hemispheres-likely PRES.  8/9:  MRI brain shows Bilateral parietal/occipital/temporal edema with extensive microhemorrhage8/10: EEG demos diffuse encephalopathy, nonspecific etiology, no seizure or epiletiform discharges.   Clinical Impression   Pt seen for OT evaluation this date in setting of acute hospitalization d/t AMS. Pt's spouse present and provides most PLOF information. 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 She does report that pt still drinking at home, but states he has been trying to cut back in recent days. This date, pt requires MAX A +3 for transition to/from sitting d/t decreased strength, but mostly d/t decreased ability to sequence basic mobility 2/2 cognitive status on presentation. Pt demos P static sitting balance initially with progress to F balance. Standing deferred as pt not stable and with decreased ability to safely follow commands (follows ~25% of commands on assessment). Pt requires MAX A for seated UB ADLs such as hand to mouth with applesauce to take medication. TOTAL A for LB ADLs at this time. Cues to initiate and sequence d/t decreased cognitive status. Will continue to follow acutely. D/t pt's current weakness and confusion, anticipate he will require STR f/u OT Services to regain strength to appropriately level ot return home.     Follow Up Recommendations  SNF    Equipment Recommendations  3 in 1 bedside commode;Tub/shower seat    Recommendations for Other Services       Precautions /  Restrictions Precautions Precautions: Fall Restrictions Weight Bearing Restrictions: No      Mobility Bed Mobility Overal bed mobility: Needs Assistance Bed Mobility: Supine to Sit;Sit to Supine     Supine to sit: Max assist;+2 for physical assistance;+2 for safety/equipment Sit to supine: Max assist;+2 for physical assistance;+2 for safety/equipment   General bed mobility comments: OT assists with trunk, PT assists with initiation of LE movement toward EOV. head of stretcher slighlty elevated. Pt is able to contribute meaningfully once mobility initiated by therapy team. TActile and verbal cues throughout.    Transfers                 General transfer comment: not safe due to lethargy/orientation this date    Balance Overall balance assessment: Needs assistance Sitting-balance support: Feet supported;Bilateral upper extremity supported Sitting balance-Leahy Scale: Fair Sitting balance - Comments: initially poor, but able to use UEs for support and eventually sustain static sitting w/o external support from therapists and only use of his UEs.                                   ADL either performed or assessed with clinical judgement   ADL                                         General ADL Comments: requires MAX A for seated UB ADLs such as hand to mouth with applesauce to take medication. TOTAL A for LB  ADLs at this time. Cues to initiate and sequence.     Vision   Additional Comments: difficult to formally assess d/t cognition, also limited eye opening during session. Appears to track appropriately when pt's eyes are open.     Perception     Praxis      Pertinent Vitals/Pain Pain Assessment: Faces Faces Pain Scale: Hurts little more Pain Location: ant chest-vague Pain Descriptors / Indicators: Discomfort Pain Intervention(s): Limited activity within patient's tolerance;Repositioned     Hand Dominance Right    Extremity/Trunk Assessment Upper Extremity Assessment Upper Extremity Assessment: Generalized weakness;Difficult to assess due to impaired cognition (ROM appears to be functional, notable atrophy to all limbs)   Lower Extremity Assessment Lower Extremity Assessment: Generalized weakness;Difficult to assess due to impaired cognition       Communication Communication Communication: No difficulties   Cognition Arousal/Alertness: Lethargic Behavior During Therapy: Restless Overall Cognitive Status: Impaired/Different from baseline                                 General Comments: pt alert to self, reports 1957 as date. Sleepy, opens eyes briefly to command. Follows ~25% of commands   General Comments       Exercises Exercises: Other exercises Other Exercises Other Exercises: OT ed with pt and spouse re: role of OT. Pt's spouse with good and apprporitae carryover.   Shoulder Instructions      Home Living Family/patient expects to be discharged to:: Private residence Living Arrangements: Spouse/significant other;Children (son) Available Help at Discharge: Family Type of Home: House Home Access: Stairs to enter Technical brewer of Steps: 2 Entrance Stairs-Rails: None Home Layout: One level     Bathroom Shower/Tub: Tub/shower unit         Home Equipment: None   Additional Comments: majority of history obtained from wife in room as pt is poor historian      Prior Functioning/Environment Level of Independence: Independent        Comments: Pt is poor historian and wife gives majority of history. Reports living at home with family, indep prior with no falls history. Currently working as Horticulturist, commercial. Does endorse alcohol abuse; recently has been decreasing alcohol intake.        OT Problem List: Decreased strength;Impaired balance (sitting and/or standing);Decreased activity tolerance;Decreased cognition;Decreased safety awareness      OT  Treatment/Interventions: Self-care/ADL training;DME and/or AE instruction;Therapeutic activities;Balance training;Therapeutic exercise;Patient/family education    OT Goals(Current goals can be found in the care plan section) Acute Rehab OT Goals Patient Stated Goal: unable to state OT Goal Formulation: With patient/family Time For Goal Achievement: 07/02/21 Potential to Achieve Goals: Good ADL Goals Pt Will Perform Grooming: with min assist;sitting Pt Will Perform Lower Body Dressing: with min assist;with mod assist;sitting/lateral leans Pt Will Transfer to Toilet: with min assist;with mod assist;bedside commode Pt Will Perform Toileting - Clothing Manipulation and hygiene: with mod assist;sitting/lateral leans  OT Frequency: Min 2X/week   Barriers to D/C:            Co-evaluation PT/OT/SLP Co-Evaluation/Treatment: Yes Reason for Co-Treatment: Complexity of the patient's impairments (multi-system involvement);Necessary to address cognition/behavior during functional activity;To address functional/ADL transfers PT goals addressed during session: Mobility/safety with mobility OT goals addressed during session: ADL's and self-care      AM-PAC OT "6 Clicks" Daily Activity     Outcome Measure Help from another person eating meals?: A Little Help from another person taking  care of personal grooming?: A Lot Help from another person toileting, which includes using toliet, bedpan, or urinal?: Total Help from another person bathing (including washing, rinsing, drying)?: Total Help from another person to put on and taking off regular upper body clothing?: A Lot Help from another person to put on and taking off regular lower body clothing?: Total 6 Click Score: 10   End of Session Equipment Utilized During Treatment: Gait belt;Rolling walker Nurse Communication: Mobility status  Activity Tolerance: Patient tolerated treatment well Patient left: in bed;with call bell/phone within  reach;with nursing/sitter in room;with family/visitor present  OT Visit Diagnosis: Unsteadiness on feet (R26.81);Muscle weakness (generalized) (M62.81)                Time: [id number] OT Time Calculation (min): 25 min Charges:  OT General Charges $OT Visit: 1 Visit OT Evaluation $OT Eval Moderate Complexity: [place], [place], OTR/L ascom [phone] 06/18/21, 4:02 PM

## 2021-06-18 NOTE — ED Notes (Signed)
Sent msg to pharmacy for missing 10am meds.

## 2021-06-18 NOTE — ED Notes (Addendum)
Dr Sloan Leiter at bedside. Woke pt up. Pt responding to commands. Pt oriented to self only. Pt repositioned in bed with assistance from provider. Dr Tera Helper entered orders for BP management to keep SBP within nml range. Pt swallowed water and 2 graham crackers in front of Dr Tera Helper. Dr Tera Helper told this nurse to administer PO BP meds before SLP consult completed.

## 2021-06-18 NOTE — Plan of Care (Signed)
  Problem: Clinical Measurements: Goal: Ability to maintain clinical measurements within normal limits will improve Outcome: Progressing Goal: Diagnostic test results will improve Outcome: Progressing Goal: Cardiovascular complication will be avoided Outcome: Progressing   Problem: Activity: Goal: Risk for activity intolerance will decrease Outcome: Progressing   Problem: Nutrition: Goal: Adequate nutrition will be maintained Outcome: Progressing   Problem: Coping: Goal: Level of anxiety will decrease Outcome: Progressing   Problem: Elimination: Goal: Will not experience complications related to bowel motility Outcome: Progressing   Problem: Pain Managment: Goal: General experience of comfort will improve Outcome: Progressing   Problem: Skin Integrity: Goal: Risk for impaired skin integrity will decrease Outcome: Progressing   Problem: Education: Goal: Knowledge of General Education information will improve Description: Including pain rating scale, medication(s)/side effects and non-pharmacologic comfort measures Outcome: Not Progressing   Problem: Health Behavior/Discharge Planning: Goal: Ability to manage health-related needs will improve Outcome: Not Progressing   Problem: Elimination: Goal: Will not experience complications related to urinary retention Outcome: Not Progressing   Problem: Safety: Goal: Ability to remain free from injury will improve Outcome: Not Progressing   Problem: Clinical Measurements: Goal: Will remain free from infection Outcome: Completed/Met

## 2021-06-18 NOTE — ED Notes (Signed)
Spoke with Dr Tera Helper. He wants full '10mg'$  IV hydralazine to be given at this time.

## 2021-06-18 NOTE — Progress Notes (Addendum)
PROGRESS NOTE        PATIENT DETAILS Name: Mark Carr Age: 65 y.o. Sex: male Date of Birth: 09-16-1956 Admit Date: 06/16/2021 Admitting Physician Rhetta Mura, DO [id number], Dionne Bucy, MD  Brief Narrative: Patient is a 65 y.o. male cirrhosis, EtOH use, prior history of cocaine use-who presented with sudden onset of altered mental status-found to have PRES on neuroimaging.  Significant events: 8/8>> admit for confusion-PRES on MRI brain.  Significant studies: 8/8>> UDS: Positive for benzodiazepines 8/8>> CT head: Low density throughout the white matter in the posterior cerebral hemispheres-likely PRES. 8/9>> MRI brain: Bilateral parietal/occipital/temporal edema with extensive microhemorrhage 8/9>> EEG: No seizures.  Antimicrobial therapy: None  Microbiology data: None  Procedures : None  Consults: Neurology.  DVT Prophylaxis : SCDs Start: 06/16/21 1906   Subjective: Much more awake and alert compared to yesterday-although still confused.  He is able to tell me his name-that he lives with his wife and is able to follow simple commands.  He was severely agitated last night ripping out his IV line-clevidipine infusion that was started was subsequently stopped.  Assessment/Plan: Acute encephalopathy likely due to PRES: Encephalopathy has somewhat improved today-more awake and alert today but clearly not at his baseline.  He is now able to take some oral intake-we will start amlodipine and labetalol orally.  Discussed with RN-continue to use IV hydralazine and IV labetalol as needed to keep blood pressure in the [id number] systolic range.  Hopefully with continued better BP control-he will continue to slowly improve.  Appreciate neurology input.  If BP continues to stay stable-suspect does not need to go to the ICU.  Likely undiagnosed HTN: BP better controlled this morning-have started oral amlodipine/labetalol-remains on as needed IV  hydralazine/labetalol.  Given severe agitation-patient was not able to tolerate IV clevidipine infusion and this was subsequently discontinued last night.  See above.    History of alcohol use: Per daughter at bedside on 8/9-patient has cut down on daily EtOH intake-down to 2 bottles of beer.  On Ativan per CIWA protocol-watch closely.  Reported history of liver cirrhosis-probably due to alcohol use: Remains compensated.  Tobacco use disorder: Counseled when more awake and alert  History of cocaine use: Per family-patient has not done cocaine in the past several months-UDS without cocaine.  Recent COVID-19 infection: Currently asymptomatic-does not require any further isolation.  Diet: Diet Order             DIET DYS 2 Room service appropriate? Yes with Assist; Fluid consistency: Thin  Diet effective now                    Code Status: Full code   Family Communication: Spouse at bedside  Disposition Plan: Status is: Inpatient  Remains inpatient appropriate because:Inpatient level of care appropriate due to severity of illness  Dispo: The patient is from: Home              Anticipated d/c is to: Home              Patient currently is not medically stable to d/c.   Difficult to place patient No   Barriers to Discharge: Still with significant encephalopathy-not yet at baseline.  Antimicrobial agents: Anti-infectives (From admission, onward)    None       Time spent: 35 minutes-Greater than 50% of this time  was spent in counseling, explanation of diagnosis, planning of further management, and coordination of care.  MEDICATIONS: Scheduled Meds:  amLODipine  5 mg Oral Daily   folic acid  1 mg Oral Daily   labetalol  200 mg Oral BID   multivitamin with minerals  1 tablet Oral Daily   [START ON 06/20/2021] thiamine injection  100 mg Intravenous Daily   Continuous Infusions:  clevidipine Stopped (06/18/21 0323)   lactated ringers Stopped (06/17/21 2229)    thiamine injection Stopped (06/18/21 1230)   PRN Meds:.acetaminophen **OR** acetaminophen, haloperidol lactate, hydrALAZINE, labetalol, LORazepam **OR** LORazepam   PHYSICAL EXAM: Vital signs: Vitals:   06/18/21 1100 06/18/21 1130 06/18/21 1200 06/18/21 1230  BP: 125/77 137/71 130/78 130/77  Pulse: 94 95 99 96  Resp: '15 14 14 16  '$ Temp:      TempSrc:      SpO2: 98% 100% 99% 98%   There were no vitals filed for this visit. There is no height or weight on file to calculate BMI.   Gen Exam: Much more awake compared to yesterday-following simple commands.  Not in any distress. HEENT:atraumatic, normocephalic Chest: B/L clear to auscultation anteriorly CVS:S1S2 regular Abdomen:soft non tender, non distended Extremities:no edema Neurology: Difficult exam but moving all 4 extremities.   Skin: no rash   I have personally reviewed following labs and imaging studies  LABORATORY DATA: CBC: Recent Labs  Lab 06/16/21 1628 06/17/21 0335 06/18/21 0500  WBC 7.2 6.2 5.9  NEUTROABS 2.6  --   --   HGB 13.6 12.9* 13.8  HCT 39.8 36.9* 38.4*  MCV 97.1 94.6 94.8  PLT 161 133* 136*     Basic Metabolic Panel: Recent Labs  Lab 06/16/21 1628 06/17/21 0335 06/18/21 0500  NA 139 139 138  K 3.7 3.8 3.7  CL 108 108 108  CO2 16* 23 23  GLUCOSE 123* 109* 87  BUN '10 10 9  '$ CREATININE 0.93 0.72 0.58*  CALCIUM 9.3 8.7* 9.3  MG 2.3 1.9  --   PHOS  --  2.5  --      GFR: CrCl cannot be calculated (Unknown ideal weight.).  Liver Function Tests: Recent Labs  Lab 06/16/21 1628 06/17/21 0335  AST 42* 43*  ALT 21 18  ALKPHOS 93 75  BILITOT 0.8 1.4*  PROT 8.2* 7.5  ALBUMIN 4.2 3.8    No results for input(s): LIPASE, AMYLASE in the last 168 hours. Recent Labs  Lab 06/17/21 0335  AMMONIA 10     Coagulation Profile: Recent Labs  Lab 06/16/21 1628 06/17/21 0335  INR 1.2 1.2     Cardiac Enzymes: Recent Labs  Lab 06/16/21 1628  CKTOTAL 112     BNP (last 3  results) No results for input(s): PROBNP in the last 8760 hours.  Lipid Profile: No results for input(s): CHOL, HDL, LDLCALC, TRIG, CHOLHDL, LDLDIRECT in the last 72 hours.  Thyroid Function Tests: Recent Labs    06/16/21 1628  TSH 3.088     Anemia Panel: No results for input(s): VITAMINB12, FOLATE, FERRITIN, TIBC, IRON, RETICCTPCT in the last 72 hours.  Urine analysis:    Component Value Date/Time   COLORURINE YELLOW (A) 04/19/2021 0604   APPEARANCEUR CLEAR (A) 04/19/2021 0604   LABSPEC 1.010 04/19/2021 0604   PHURINE 7.0 04/19/2021 0604   GLUCOSEU NEGATIVE 04/19/2021 0604   HGBUR SMALL (A) 04/19/2021 0604   BILIRUBINUR NEGATIVE 04/19/2021 0604   KETONESUR NEGATIVE 04/19/2021 0604   PROTEINUR NEGATIVE 04/19/2021 0604   NITRITE  NEGATIVE 04/19/2021 0604   LEUKOCYTESUR MODERATE (A) 04/19/2021 0604    Sepsis Labs: Lactic Acid, Venous    Component Value Date/Time   LATICACIDVEN 1.9 06/16/2021 2216    MICROBIOLOGY: No results found for this or any previous visit (from the past 240 hour(s)).  RADIOLOGY STUDIES/RESULTS: CT HEAD WO CONTRAST (5MM)  Result Date: 06/16/2021 CLINICAL DATA:  Mental status change, unknown cause EXAM: CT HEAD WITHOUT CONTRAST TECHNIQUE: Contiguous axial images were obtained from the base of the skull through the vertex without intravenous contrast. COMPARISON:  04/01/2021 FINDINGS: Brain: There is low-density within the white matter of the posterior cerebral hemispheres bilaterally, left greater than right. Favor PRES. No hemorrhage or hydrocephalus. Vascular: No hyperdense vessel or unexpected calcification. Skull: No acute calvarial abnormality. Sinuses/Orbits: No acute findings Other: None IMPRESSION: Low-density throughout the white matter of the posterior cerebral hemispheres bilaterally most pronounced in the left occipital lobe. Favor PRES. No definite cortical infarct, hemorrhage or hydrocephalus. This could be further evaluated with MRI if felt  clinically indicated. Electronically Signed   By: Rolm Baptise M.D.   On: 06/16/2021 17:18   MR BRAIN WO CONTRAST  Result Date: 06/17/2021 CLINICAL DATA:  Mental status change, unknown cause EXAM: MRI HEAD WITHOUT CONTRAST TECHNIQUE: Multiplanar, multiecho pulse sequences of the brain and surrounding structures were obtained without intravenous contrast. COMPARISON:  Correlation made with CT head 06/16/2021 FINDINGS: Motion artifact is present. Brain: There is T2 FLAIR hyperintensity in the parietal, occipital, and temporal lobes involving the white matter. Cortex appears to be spared. There is no associated reduced diffusion. Corresponding susceptibility is present likely reflecting areas of extensive microhemorrhage. Mild mass effect is present with effacement of the posterior left lateral ventricle. No extra-axial collection. Additional areas of T2 hyperintensity in the supratentorial white matter may reflect chronic microvascular ischemic changes. Vascular: Major vessel flow voids at the skull base are preserved. Skull and upper cervical spine: Normal marrow signal is preserved. Sinuses/Orbits: Paranasal sinuses are aerated. Orbits are unremarkable. Other: Sella is unremarkable. Patchy left mastoid fluid opacification. IMPRESSION: Motion degraded study. Bilateral parietal, occipital, and temporal edema with extensive microhemorrhage. Minor mass effect. 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 Electronically Signed   By: Macy Mis M.D.   On: 06/17/2021 10:33   DG Chest Port 1 View  Result Date: 06/16/2021 CLINICAL DATA:  Acute encephalopathy EXAM: PORTABLE CHEST 1 VIEW COMPARISON:  The 04/19/2021 FINDINGS: Left base atelectasis. Right lung clear. No effusions. Heart is borderline in size. No acute bony abnormality. IMPRESSION: Left base atelectasis. Electronically Signed   By: Rolm Baptise M.D.   On: 06/16/2021 21:52   EEG adult  Result Date: 06/17/2021 Lora Havens, MD      06/17/2021  5:38 PM Patient Name: MOHAMUD SCHRADER MRN: [id number] Epilepsy Attending: Lora Havens Referring Physician/Provider: Dr Oren Binet Date: 06/17/2021 Duration: 23.53 mins Patient history:64 y.o. male cirrhosis, EtOH use, prior history of cocaine use-who presented with sudden onset of altered mental status-found to have PRES on neuroimaging. EEG to evaluate for seizure. Level of alertness: Awake AEDs during EEG study: Ativan Technical aspects: This EEG study was done with scalp electrodes positioned according to the 10-20 International system of electrode placement. Electrical activity was acquired at a sampling rate of '500Hz'$  and reviewed with a high frequency filter of '70Hz'$  and a low frequency filter of '1Hz'$ . EEG data were recorded continuously and digitally stored. Description: EEG showed continuous generalized 3 to 6 Hz theta-delta slowing with an excessive amount  of 15 to 18 Hz beta activity distributed symmetrically and diffusely. Hyperventilation and photic stimulation were not performed.   ABNORMALITY - Continuous slow, generalized - Excessive beta, generalized IMPRESSION: This study is suggestive of moderate diffuse encephalopathy, nonspecific etiology. The excessive beta activity seen in the background is most likely due to the effect of benzodiazepine and is a benign EEG pattern. No seizures or epileptiform discharges were seen throughout the recording. Priyanka Barbra Sarks     LOS: 2 days   Oren Binet, MD  [facility]    To contact the attending provider between 7A-7P or the covering provider during after hours 7P-7A, please log into the web site [email or web address] and access using universal [place] password for that web site. If you do not have the password, please call the hospital operator.  06/18/2021, 1:34 PM

## 2021-06-18 NOTE — ED Notes (Signed)
SLP at bedside.

## 2021-06-18 NOTE — ED Notes (Signed)
PT at bedside working with pt.  Dr Tera Helper wants both PO BP meds to be given and to maintain SBP 120s-130s.

## 2021-06-19 DIAGNOSIS — Z7289 Other problems related to lifestyle: Secondary | ICD-10-CM

## 2021-06-19 LAB — CBC
HCT: 34 % — ABNORMAL LOW (ref 39.0–52.0)
Hemoglobin: 12.6 g/dL — ABNORMAL LOW (ref 13.0–17.0)
MCH: 34.7 pg — ABNORMAL HIGH (ref 26.0–34.0)
MCHC: 37.1 g/dL — ABNORMAL HIGH (ref 30.0–36.0)
MCV: 93.7 fL (ref 80.0–100.0)
Platelets: 139 10*3/uL — ABNORMAL LOW (ref 150–400)
RBC: 3.63 MIL/uL — ABNORMAL LOW (ref 4.22–5.81)
RDW: 12.9 % (ref 11.5–15.5)
WBC: 5.7 10*3/uL (ref 4.0–10.5)
nRBC: 0 % (ref 0.0–0.2)

## 2021-06-19 LAB — BASIC METABOLIC PANEL
Anion gap: 8 (ref 5–15)
BUN: 12 mg/dL (ref 8–23)
CO2: 22 mmol/L (ref 22–32)
Calcium: 9.2 mg/dL (ref 8.9–10.3)
Chloride: 107 mmol/L (ref 98–111)
Creatinine, Ser: 0.74 mg/dL (ref 0.61–1.24)
GFR, Estimated: >60 mL/min (ref >60)
Glucose, Bld: 91 mg/dL (ref 70–99)
Potassium: 3.4 mmol/L — ABNORMAL LOW (ref 3.5–5.1)
Sodium: 137 mmol/L (ref 135–145)

## 2021-06-19 LAB — VITAMIN B1: Vitamin B1 (Thiamine): 238.9 nmol/L — ABNORMAL HIGH (ref 66.5–200.0)

## 2021-06-19 LAB — METHYLMALONIC ACID, SERUM: Methylmalonic Acid, Quantitative: 213 nmol/L (ref 0–378)

## 2021-06-19 LAB — AMMONIA: Ammonia: 30 umol/L (ref 9–35)

## 2021-06-19 LAB — GLUCOSE, CAPILLARY: Glucose-Capillary: 87 mg/dL (ref 70–99)

## 2021-06-19 MED ORDER — POTASSIUM CHLORIDE 20 MEQ PO PACK
40.0000 meq | PACK | Freq: Once | ORAL | Status: AC
  Administered 2021-06-19: 40 meq via ORAL
  Filled 2021-06-19: qty 2

## 2021-06-19 MED ORDER — ENSURE ENLIVE PO LIQD
237.0000 mL | Freq: Three times a day (TID) | ORAL | Status: DC
  Administered 2021-06-19 – 2021-06-20 (×3): 237 mL via ORAL

## 2021-06-19 MED ORDER — NICOTINE 21 MG/24HR TD PT24
21.0000 mg | MEDICATED_PATCH | Freq: Every day | TRANSDERMAL | Status: DC
  Administered 2021-06-19: 19:00:00 21 mg via TRANSDERMAL
  Filled 2021-06-19: qty 1

## 2021-06-19 NOTE — Progress Notes (Signed)
PROGRESS NOTE        PATIENT DETAILS Name: Mark Carr Age: 65 y.o. Sex: male Date of Birth: [birth year] Admit Date: 06/16/2021 Admitting Physician Rhetta Mura, DO [id number], Dionne Bucy, MD  Brief Narrative: Patient is a 65 y.o. male cirrhosis, EtOH use, prior history of cocaine use-who presented with sudden onset of altered mental status-found to have PRES on neuroimaging.  Significant events: 8/8>> admit for confusion-PRES on MRI brain.  Significant studies: 8/8>> UDS: Positive for benzodiazepines 8/8>> CT head: Low density throughout the white matter in the posterior cerebral hemispheres-likely PRES. 8/9>> MRI brain: Bilateral parietal/occipital/temporal edema with extensive microhemorrhage 8/9>> EEG: No seizures.  Antimicrobial therapy: None  Microbiology data: None  Procedures : None  Consults: Neurology.  DVT Prophylaxis : SCDs Start: 06/16/21 1906   Subjective: Significantly much more awake and alert compared to yesterday-more fluent-today compared to the past few days.  Easily able to answer his age-tell me his daughter's name.  Assessment/Plan: Acute encephalopathy likely due to PRES: Encephalopathy improving-BP well controlled.  Continue close monitoring-appreciate neurology input.  We will transfer to [place] today.    Likely undiagnosed HTN: BP better controlled-continue oral labetalol/amlodipine.  Continue to use as needed IV hydralazine/labetalol.    History of alcohol use: Per daughter at bedside on 8/9-patient has cut down on daily EtOH intake-down to 2 bottles of beer.  On Ativan per CIWA protocol-watch closely.  Reported history of liver cirrhosis-probably due to alcohol use: Remains compensated.  Tobacco use disorder: Counseled when more awake and alert  History of cocaine use: Per family-patient has not done cocaine in the past several months-UDS without cocaine.  Recent COVID-19 infection: Currently  asymptomatic-does not require any further isolation.  Diet: Diet Order             DIET DYS 2 Room service appropriate? Yes with Assist; Fluid consistency: Thin  Diet effective now                    Code Status: Full code   Family Communication: [id number]/11  Disposition Plan: Status is: Inpatient  Remains inpatient appropriate because:Inpatient level of care appropriate due to severity of illness  Dispo: The patient is from: Home              Anticipated d/c is to: Home              Patient currently is not medically stable to d/c.   Difficult to place patient No   Barriers to Discharge: Resolving encephalopathy-continue to optimize antihypertensive medications  Antimicrobial agents: Anti-infectives (From admission, onward)    None       Time spent: 25 minutes-Greater than 50% of this time was spent in counseling, explanation of diagnosis, planning of further management, and coordination of care.  MEDICATIONS: Scheduled Meds:  amLODipine  5 mg Oral Daily   Chlorhexidine Gluconate Cloth  6 each Topical Daily   feeding supplement  237 mL Oral TID BM   folic acid  1 mg Oral Daily   labetalol  200 mg Oral BID   multivitamin with minerals  1 tablet Oral Daily   [START ON 06/20/2021] thiamine injection  100 mg Intravenous Daily   Continuous Infusions:  lactated ringers Stopped (06/17/21 2229)   thiamine injection 500 mg (06/19/21 1116)   PRN Meds:.acetaminophen **OR** acetaminophen, haloperidol lactate, hydrALAZINE,  labetalol, LORazepam **OR** LORazepam   PHYSICAL EXAM: Vital signs: Vitals:   06/19/21 0355 06/19/21 0400 06/19/21 0500 06/19/21 0800  BP:  119/73  128/73  Pulse:  76  71  Resp:      Temp:   98.1 F (36.7 C) 97.6 F (36.4 C)  TempSrc:   Oral   SpO2:  97%  97%  Weight: 53.7 kg      Filed Weights   06/19/21 0355  Weight: 53.7 kg   Body mass index is 18 kg/m.   Gen Exam:Alert awake-not in any  distress HEENT:atraumatic, normocephalic Chest: B/L clear to auscultation anteriorly CVS:S1S2 regular Abdomen:soft non tender, non distended Extremities:no edema Neurology: Non focal Skin: no rash   I have personally reviewed following labs and imaging studies  LABORATORY DATA: CBC: Recent Labs  Lab 06/16/21 1628 06/17/21 0335 06/18/21 0500 06/19/21 0518  WBC 7.2 6.2 5.9 5.7  NEUTROABS 2.6  --   --   --   HGB 13.6 12.9* 13.8 12.6*  HCT 39.8 36.9* 38.4* 34.0*  MCV 97.1 94.6 94.8 93.7  PLT 161 133* 136* 139*     Basic Metabolic Panel: Recent Labs  Lab 06/16/21 1628 06/17/21 0335 06/18/21 0500 06/19/21 0518  NA 139 139 138 137  K 3.7 3.8 3.7 3.4*  CL 108 108 108 107  CO2 16* '23 23 22  '$ GLUCOSE 123* 109* 87 91  BUN '10 10 9 12  '$ CREATININE 0.93 0.72 0.58* 0.74  CALCIUM 9.3 8.7* 9.3 9.2  MG 2.3 1.9  --   --   PHOS  --  2.5  --   --      GFR: Estimated Creatinine Clearance: 70.9 mL/min (by C-G formula based on SCr of 0.74 mg/dL).  Liver Function Tests: Recent Labs  Lab 06/16/21 1628 06/17/21 0335  AST 42* 43*  ALT 21 18  ALKPHOS 93 75  BILITOT 0.8 1.4*  PROT 8.2* 7.5  ALBUMIN 4.2 3.8    No results for input(s): LIPASE, AMYLASE in the last 168 hours. Recent Labs  Lab 06/17/21 0335 06/19/21 0518  AMMONIA 10 30     Coagulation Profile: Recent Labs  Lab 06/16/21 1628 06/17/21 0335  INR 1.2 1.2     Cardiac Enzymes: Recent Labs  Lab 06/16/21 1628  CKTOTAL 112     BNP (last 3 results) No results for input(s): PROBNP in the last 8760 hours.  Lipid Profile: No results for input(s): CHOL, HDL, LDLCALC, TRIG, CHOLHDL, LDLDIRECT in the last 72 hours.  Thyroid Function Tests: Recent Labs    06/16/21 1628  TSH 3.088     Anemia Panel: No results for input(s): VITAMINB12, FOLATE, FERRITIN, TIBC, IRON, RETICCTPCT in the last 72 hours.  Urine analysis:    Component Value Date/Time   COLORURINE YELLOW (A) 04/19/2021 0604    APPEARANCEUR CLEAR (A) 04/19/2021 0604   LABSPEC 1.010 04/19/2021 0604   PHURINE 7.0 04/19/2021 0604   GLUCOSEU NEGATIVE 04/19/2021 0604   HGBUR SMALL (A) 04/19/2021 0604   BILIRUBINUR NEGATIVE 04/19/2021 0604   KETONESUR NEGATIVE 04/19/2021 0604   PROTEINUR NEGATIVE 04/19/2021 0604   NITRITE NEGATIVE 04/19/2021 0604   LEUKOCYTESUR MODERATE (A) 04/19/2021 0604    Sepsis Labs: Lactic Acid, Venous    Component Value Date/Time   LATICACIDVEN 1.9 06/16/2021 2216    MICROBIOLOGY: Recent Results (from the past 240 hour(s))  MRSA Next Gen by PCR, Nasal     Status: None   Collection Time: 06/18/21  5:07 PM   Specimen:  Nasal Mucosa; Nasal Swab  Result Value Ref Range Status   MRSA by PCR Next Gen NOT DETECTED NOT DETECTED Final    Comment: (NOTE) The GeneXpert MRSA Assay (FDA approved for NASAL specimens only), is one component of a comprehensive MRSA colonization surveillance program. It is not intended to diagnose MRSA infection nor to guide or monitor treatment for MRSA infections. Test performance is not FDA approved in patients less than 34 years old. Performed at [facility] [place], [address] [place], [place] [address]     RADIOLOGY STUDIES/RESULTS: EEG adult  Result Date: 06/17/2021 Lora Havens, MD     06/17/2021  5:38 PM Patient Name: Mark Carr MRN: [id number] Epilepsy Attending: Lora Havens Referring Physician/Provider: Dr Oren Binet Date: 06/17/2021 Duration: 23.53 mins Patient history:64 y.o. male cirrhosis, EtOH use, prior history of cocaine use-who presented with sudden onset of altered mental status-found to have PRES on neuroimaging. EEG to evaluate for seizure. Level of alertness: Awake AEDs during EEG study: Ativan Technical aspects: This EEG study was done with scalp electrodes positioned according to the 10-20 International system of electrode placement. Electrical activity was acquired at a sampling rate of '500Hz'$  and reviewed with a high  frequency filter of '70Hz'$  and a low frequency filter of '1Hz'$ . EEG data were recorded continuously and digitally stored. Description: EEG showed continuous generalized 3 to 6 Hz theta-delta slowing with an excessive amount of 15 to 18 Hz beta activity distributed symmetrically and diffusely. Hyperventilation and photic stimulation were not performed.   ABNORMALITY - Continuous slow, generalized - Excessive beta, generalized IMPRESSION: This study is suggestive of moderate diffuse encephalopathy, nonspecific etiology. The excessive beta activity seen in the background is most likely due to the effect of benzodiazepine and is a benign EEG pattern. No seizures or epileptiform discharges were seen throughout the recording. [place]     LOS: 3 days   Oren Binet, MD  [facility]    To contact the attending provider between 7A-7P or the covering provider during after hours 7P-7A, please log into the web site [email or web address] and access using universal [place] password for that web site. If you do not have the password, please call the hospital operator.  06/19/2021, 11:24 AM

## 2021-06-19 NOTE — Progress Notes (Addendum)
Occupational Therapy Treatment Patient Details Name: Mark Carr MRN: [id number] DOB: 26-Jan-1956 Today's Date: 06/19/2021    History of present illness Pt is a 65 y/o M with PMH: liver cirrhosis, alcohol abuse, tobacco abuse, prior history of cocaine use-who presented with sudden onset of altered mental status. 8/8: CT head shows low density throughout the white matter in the posterior cerebral hemispheres-likely PRES.  8/9:  MRI brain shows Bilateral parietal/occipital/temporal edema with extensive microhemorrhage8/10: EEG demos diffuse encephalopathy, nonspecific etiology, no seizure or epiletiform discharges.   OT comments  Pt seen for OT tx this date to f/u re: safety with ADLs/ADL mobility. Pt with exponentially improved mentation and physical appearance this date versus initial assessment yesterday. He is pleasant and wanting to be active. Pt demos ability to come to EOB sitting with MOD I with HOB elevated and some increased time and effort. Pt demos G sitting balance. OT engages pt in fxl mobility in the room with no AD with CGA and progress to SUPV only, to simulate functional [place] distances. Pt with no LOB or sway and with minimal cueing, demos good awareness of fall hazards. Pt able to perform LB dressing with SETUP in sitting and commode transfer with SUPV with minimal cues. Pt requires CGA for clothing mgt with toileting. At end of session, pt feeling he needs to continue to have BM and requests more time, RN and CNA notified and pt left seated on BSC with call light in reach and VSS. Pt with significant progress towards his PLOF with ADLs/ADL mobility. OT recommendation updated to home health to reflect progress.    Follow Up Recommendations  Home health OT;Supervision - Intermittent    Equipment Recommendations  3 in 1 bedside commode;Tub/shower seat    Recommendations for Other Services      Precautions / Restrictions Precautions Precautions: Fall Restrictions Weight Bearing  Restrictions: No       Mobility Bed Mobility Overal bed mobility: Needs Assistance Bed Mobility: Supine to Sit     Supine to sit: Modified independent (Device/Increase time);Supervision Sit to supine: Supervision;Modified independent (Device/Increase time)   General bed mobility comments: increased time, use of bed rails, HOB slightly elevated    Transfers Overall transfer level: Needs assistance Equipment used: None Transfers: Sit to/from Stand Sit to Stand: Supervision         General transfer comment: improved control, steady with ascent to stand and descent to sitting, only 1-2 cues for transfers initially and pt completes subsequent trials safely with good carryover of technique.    Balance Overall balance assessment: Needs assistance Sitting-balance support: Feet supported;Bilateral upper extremity supported Sitting balance-Leahy Scale: Good Sitting balance - Comments: G stability for sitting EOB statically and F with use of UEs for support for more dynamic sitting tasks   Standing balance support: No upper extremity supported Standing balance-Leahy Scale: Fair Standing balance comment: able to static stand w/o UE support, some instability initially, but progresses to sturdy static stand w/o AD. Cannot tolerate challenge to his balance.                           ADL either performed or assessed with clinical judgement   ADL Overall ADL's : Needs assistance/impaired     Grooming: Wash/dry face;Wash/dry hands;Set up;Sitting               Lower Body Dressing: Set up;Sitting/lateral leans Lower Body Dressing Details (indicate cue type and reason): to don socks in  sitting EOB Toilet Transfer: Media planner Details (indicate cue type and reason): with SBA with no AD to take ~5-6 steps from EOB to [place] and Hygiene: Set up;Min guard;Sit to/from stand       Functional mobility during ADLs:  Supervision/safety (no AD to complete fxl mobility w/in the room)       Vision Patient Visual Report: No change from baseline     Perception     Praxis      Cognition Arousal/Alertness: Awake/alert Behavior During Therapy: WFL for tasks assessed/performed Overall Cognitive Status: Within Functional Limits for tasks assessed                                 General Comments: alert x 4. Pleasant. No tremors. Follows commands        Exercises Other Exercises Other Exercises: ed re: safety considerations d/t his lines/leads. Pt with good awareness throughout session and demos awareness of fall hazards   Shoulder Instructions       General Comments      Pertinent Vitals/ Pain       Pain Assessment: Faces Faces Pain Scale: No hurt  Home Living                                          Prior Functioning/Environment              Frequency  Min 2X/week        Progress Toward Goals  OT Goals(current goals can now be found in the care plan section)  Progress towards OT goals: Progressing toward goals  Acute Rehab OT Goals Patient Stated Goal: to go home OT Goal Formulation: With patient/family Time For Goal Achievement: 07/02/21 Potential to Achieve Goals: Good  Plan Discharge plan needs to be updated    Co-evaluation                 AM-PAC OT "6 Clicks" Daily Activity     Outcome Measure   Help from another person eating meals?: None Help from another person taking care of personal grooming?: None Help from another person toileting, which includes using toliet, bedpan, or urinal?: A Little Help from another person bathing (including washing, rinsing, drying)?: A Little Help from another person to put on and taking off regular upper body clothing?: None Help from another person to put on and taking off regular lower body clothing?: A Little 6 Click Score: 21    End of Session Equipment Utilized During Treatment:  Gait belt  OT Visit Diagnosis: Unsteadiness on feet (R26.81);Muscle weakness (generalized) (M62.81)   Activity Tolerance Patient tolerated treatment well   Patient Left in bed;with call bell/phone within reach;with nursing/sitter in room;with family/visitor present   Nurse Communication Mobility status        Time: [id number] OT Time Calculation (min): 23 min  Charges: OT General Charges $OT Visit: 1 Visit OT Treatments $Self Care/Home Management : 8-22 mins $Therapeutic Activity: 8-22 mins  Gerrianne Scale, [place], OTR/L ascom [phone] 06/19/21, 5:44 PM

## 2021-06-19 NOTE — Progress Notes (Signed)
Physical Therapy Treatment Patient Details Name: Mark Carr MRN: [id number] DOB: [birth year] Today's Date: 06/19/2021    History of Present Illness Pt is a 65 y/o M with PMH: liver cirrhosis, alcohol abuse, tobacco abuse, prior history of cocaine use-who presented with sudden onset of altered mental status. 8/8: CT head shows low density throughout the white matter in the posterior cerebral hemispheres-likely PRES.  8/9:  MRI brain shows Bilateral parietal/occipital/temporal edema with extensive microhemorrhage8/10: EEG demos diffuse encephalopathy, nonspecific etiology, no seizure or epiletiform discharges.    PT Comments    Pt is making good progress towards goals. A&O x 4 with pleasant affect. No tremors noted. Intact coordination with slight variability with RAMP on B UE/LE, drastically improved from previous session. Able to ambulate in hallway with vitals monitored; WNL. Slight unsteadiness with balance and turns. May benefit from OP PT for falls risk and improved balance in order to return to full functional independence. Family updated.   Follow Up Recommendations  Outpatient PT (for balance)     Equipment Recommendations  None recommended by PT    Recommendations for Other Services       Precautions / Restrictions Precautions Precautions: Fall Restrictions Weight Bearing Restrictions: No    Mobility  Bed Mobility Overal bed mobility: Needs Assistance Bed Mobility: Supine to Sit     Supine to sit: Min guard Sit to supine: Min guard   General bed mobility comments: safe technique with ease of transfer. ONce seated, upright posture noted. No complaints of HA or dizziness    Transfers Overall transfer level: Needs assistance Equipment used: None Transfers: Sit to/from Stand Sit to Stand: Min guard         General transfer comment: safe technique with upright posture. Slight unsteadiness, however improves with increased reps  Ambulation/Gait Ambulation/Gait  assistance: Min guard Gait Distance (Feet): 200 Feet Assistive device: None Gait Pattern/deviations: Step-through pattern     General Gait Details: ambulated with reciprocal gait pattern and safe technique. No diziness noted, however slight unsteadiness with turns.   Stairs             Wheelchair Mobility    Modified Rankin (Stroke Patients Only)       Balance Overall balance assessment: Needs assistance Sitting-balance support: Feet supported;Bilateral upper extremity supported Sitting balance-Leahy Scale: Good     Standing balance support: No upper extremity supported Standing balance-Leahy Scale: Fair                              Cognition Arousal/Alertness: Awake/alert Behavior During Therapy: WFL for tasks assessed/performed Overall Cognitive Status: Within Functional Limits for tasks assessed                                 General Comments: alert x 4. Pleasant. No tremors. Follows commands      Exercises      General Comments        Pertinent Vitals/Pain Pain Assessment: Faces Faces Pain Scale: Hurts a little bit Pain Location: slight chest pain Pain Descriptors / Indicators: Discomfort Pain Intervention(s): Limited activity within patient's tolerance    Home Living                      Prior Function            PT Goals (current goals can now be found in the  care plan section) Acute Rehab PT Goals Patient Stated Goal: to go home PT Goal Formulation: With patient Time For Goal Achievement: 07/02/21 Potential to Achieve Goals: Good Progress towards PT goals: Progressing toward goals    Frequency    Min 2X/week      PT Plan Discharge plan needs to be updated;Frequency needs to be updated    Co-evaluation              AM-PAC PT "6 Clicks" Mobility   Outcome Measure  Help needed turning from your back to your side while in a flat bed without using bedrails?: None Help needed moving from  lying on your back to sitting on the side of a flat bed without using bedrails?: A Little Help needed moving to and from a bed to a chair (including a wheelchair)?: A Little Help needed standing up from a chair using your arms (e.g., wheelchair or bedside chair)?: A Little Help needed to walk in hospital room?: A Little Help needed climbing 3-5 steps with a railing? : A Little 6 Click Score: 19    End of Session Equipment Utilized During Treatment: Gait belt Activity Tolerance: Patient tolerated treatment well Patient left: in bed;with bed alarm set Nurse Communication: Mobility status PT Visit Diagnosis: Unsteadiness on feet (R26.81);Difficulty in walking, not elsewhere classified (R26.2)     Time: [id number] PT Time Calculation (min) (ACUTE ONLY): 23 min  Charges:  $Gait Training: 23-37 mins                     Greggory Stallion, [place], DPT [phone]    Ellenie Salome 06/19/2021, 1:07 PM

## 2021-06-20 ENCOUNTER — Inpatient Hospital Stay: Payer: Medicare Other

## 2021-06-20 ENCOUNTER — Telehealth: Payer: Self-pay

## 2021-06-20 LAB — BASIC METABOLIC PANEL
Anion gap: 8 (ref 5–15)
BUN: 12 mg/dL (ref 8–23)
CO2: 25 mmol/L (ref 22–32)
Calcium: 9.2 mg/dL (ref 8.9–10.3)
Chloride: 108 mmol/L (ref 98–111)
Creatinine, Ser: 0.68 mg/dL (ref 0.61–1.24)
GFR, Estimated: >60 mL/min (ref >60)
Glucose, Bld: 98 mg/dL (ref 70–99)
Potassium: 3.6 mmol/L (ref 3.5–5.1)
Sodium: 141 mmol/L (ref 135–145)

## 2021-06-20 LAB — MAGNESIUM: Magnesium: 1.7 mg/dL (ref 1.7–2.4)

## 2021-06-20 IMAGING — MR MR HEAD W/O CM
12 series · 45 of 48 positions shown · non-contrast
Comparison: Brain MRI [DATE].

CLINICAL DATA: Mental status change, unknown cause.

EXAM:
MRI HEAD WITHOUT CONTRAST
TECHNIQUE: Multiplanar, multiecho pulse sequences of the brain and surrounding
structures were obtained without intravenous contrast.

[Series 5: ax dwi_tracew · axial · 3.0mm · 0.65mm/px · z∈[-97,+51]mm · 4 of 48 slices shown]
[im 1/48]
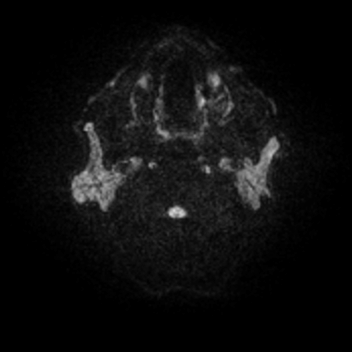
[im 16/48]
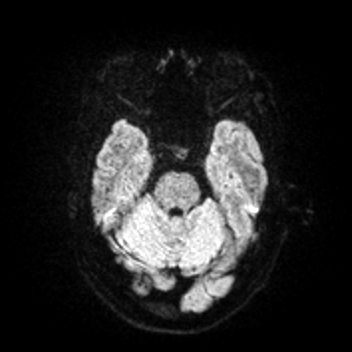
[im 32/48]
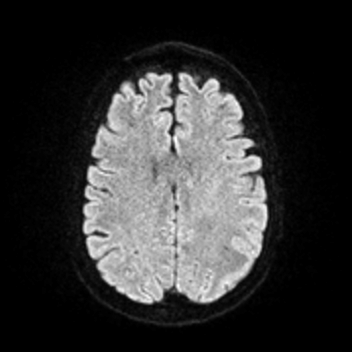
[im 48/48]
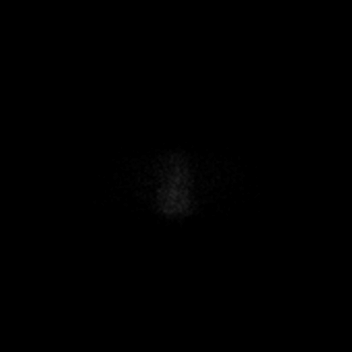

[Series 6: ax dwi_adc · axial · 3.0mm · 0.65mm/px · z∈[-97,+48]mm · 3 of 47 slices shown]
[im 1/47]
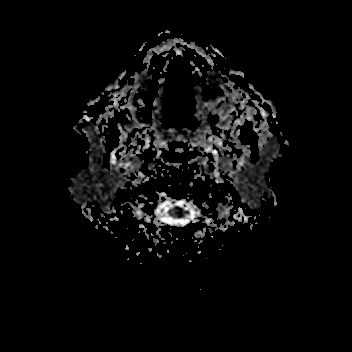
[im 24/47]
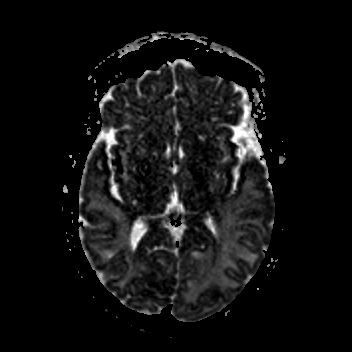
[im 47/47]
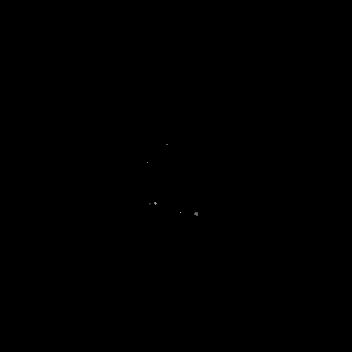

[Series 7: cor dwi_tracew · coronal · 5.0mm · 0.65mm/px · 3 of 40 slices shown]
[im 1/40]
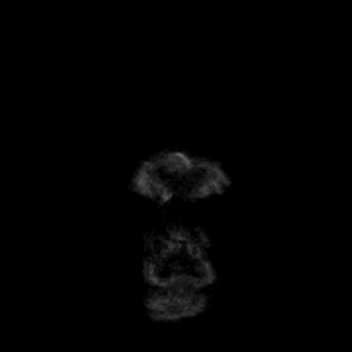
[im 20/40]
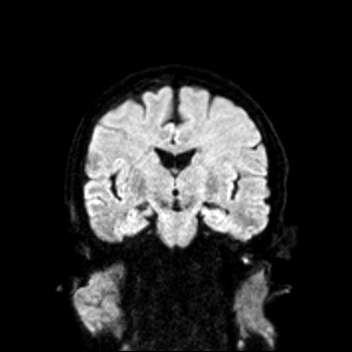
[im 40/40]
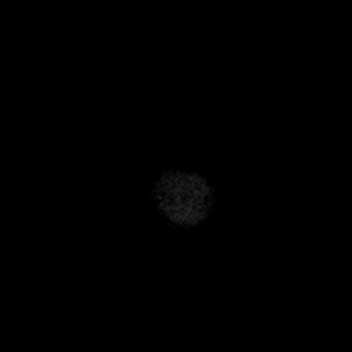

[Series 8: cor dwi_adc · coronal · 5.0mm · 0.65mm/px · 3 of 39 slices shown]
[im 1/39]
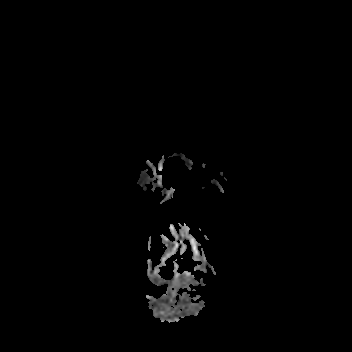
[im 20/39]
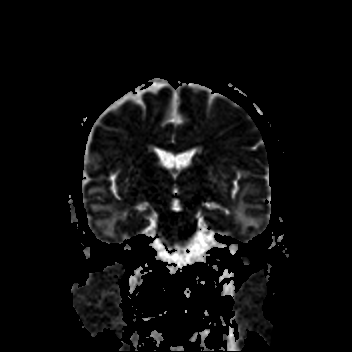
[im 39/39]
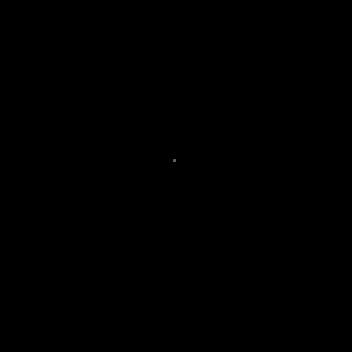

[Series 9: FLAIR · axial · 3.0mm · 0.53mm/px · z∈[-99,+56]mm · 4 of 55 slices shown]
[im 1/55]
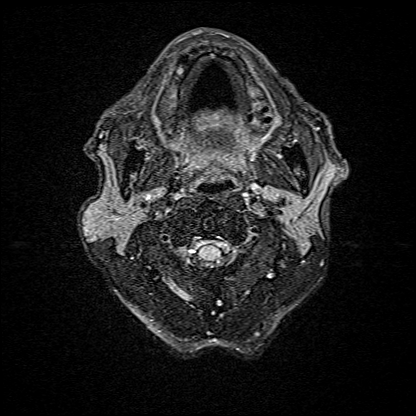
[im 19/55]
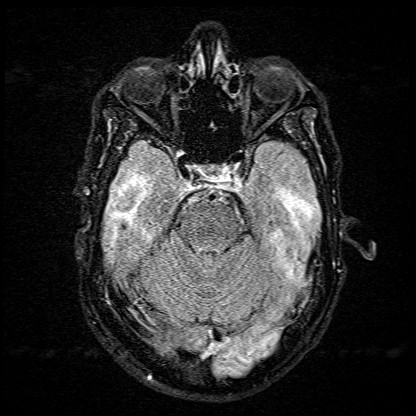
[im 37/55]
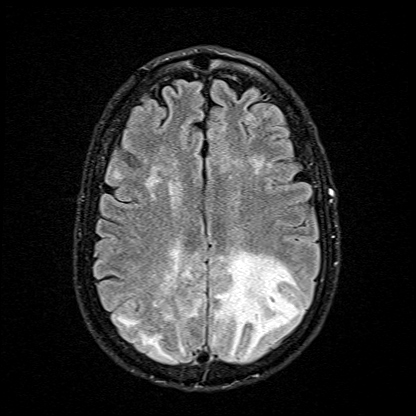
[im 55/55]
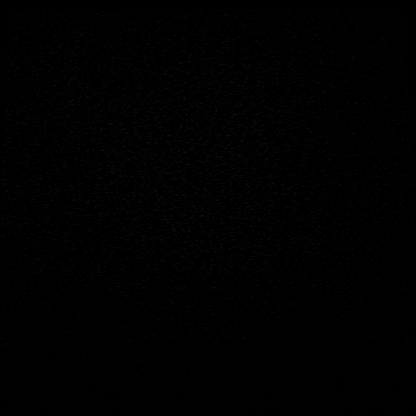

[Series 10: mag_images · axial · 3.0mm · 0.90mm/px · z∈[-107,+61]mm · 4 of 60 slices shown]
[im 1/60]
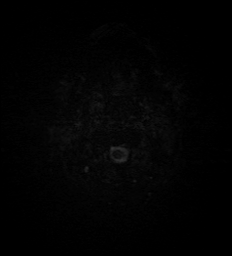
[im 20/60]
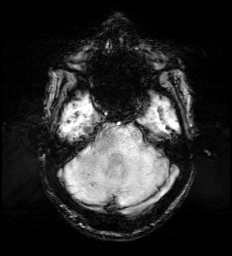
[im 40/60]
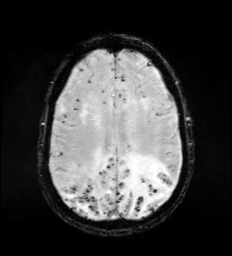
[im 60/60]
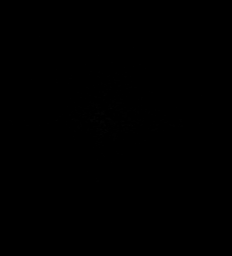

[Series 11: pha_images · axial · 3.0mm · 0.90mm/px · z∈[-107,+53]mm · 4 of 57 slices shown]
[im 1/57]
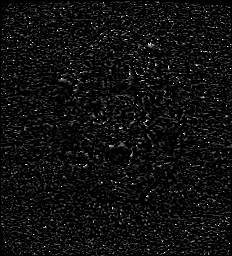
[im 19/57]
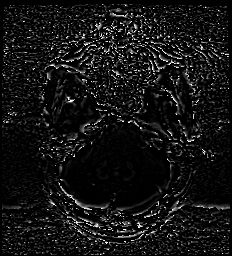
[im 38/57]
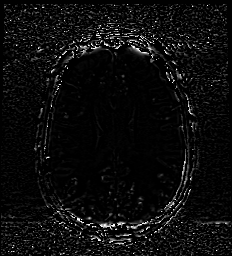
[im 57/57]
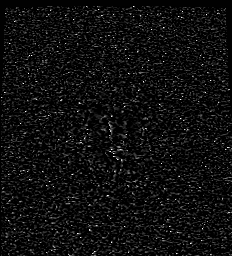

[Series 12: swi_images · axial · 3.0mm · 0.90mm/px · z∈[-107,+61]mm · 4 of 60 slices shown]
[im 1/60]
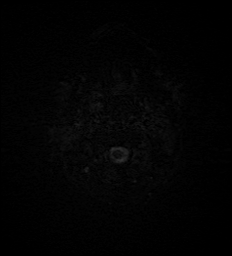
[im 20/60]
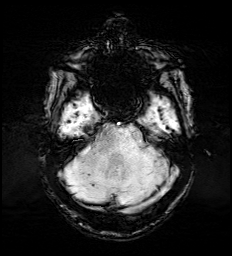
[im 40/60]
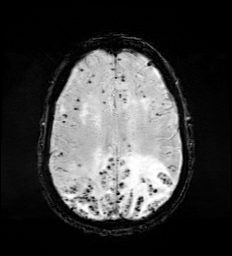
[im 60/60]
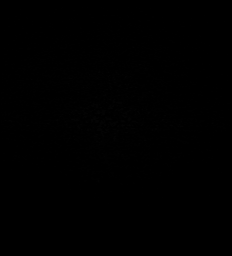

[Series 14: T1 · sagittal · 5.0mm · 0.62mm/px · 2 of 21 slices shown (1 of 2)]
[im 1/21]
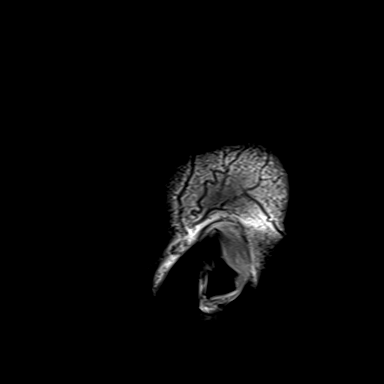
[im 21/21]
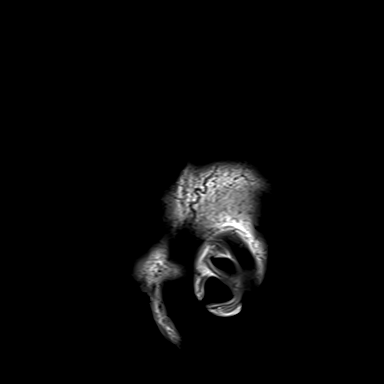

[Series 15: T2 · axial · 5.0mm · 0.53mm/px · z∈[-90,+47]mm · 2 of 25 slices shown (1 of 2)]
[im 1/25]
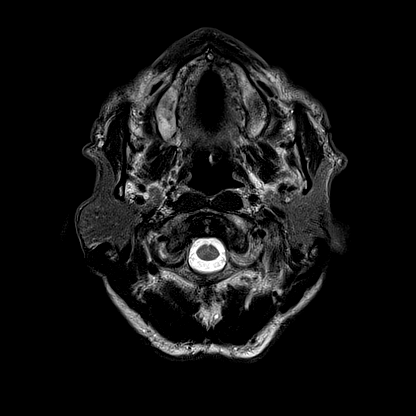
[im 25/25]
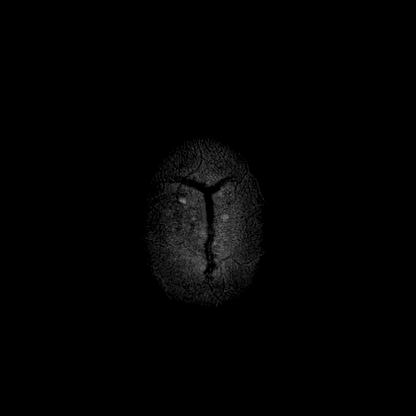

[Series 16: T1 · axial · 1.0mm · 0.98mm/px · z∈[-109,+57]mm · 10 of 175 slices shown (2 of 2)]
[im 1/175]
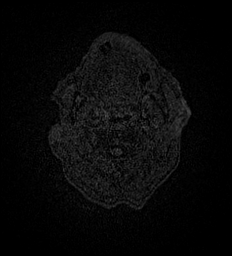
[im 15/175]
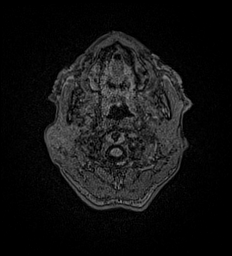
[im 30/175]
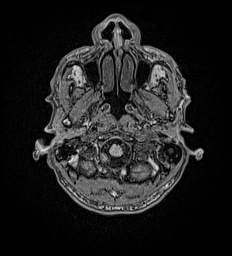
[im 44/175]
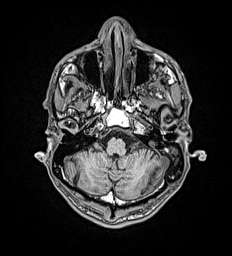
[im 59/175]
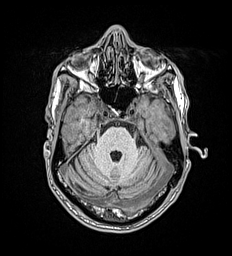
[im 73/175]
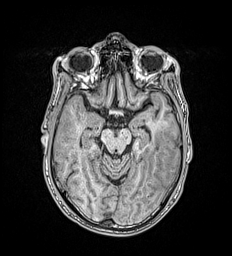
[im 102/175]
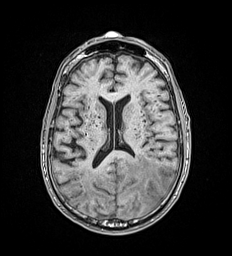
[im 117/175]
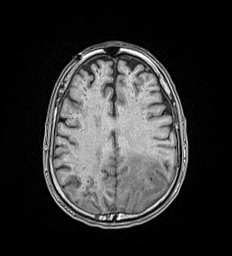
[im 146/175]
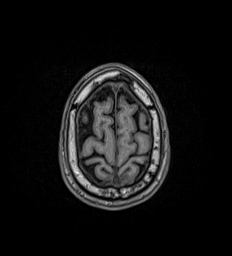
[im 175/175]
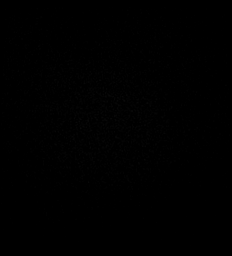

[Series 17: T2 · coronal · 5.0mm · 0.57mm/px · 2 of 29 slices shown (2 of 2)]
[im 1/29]
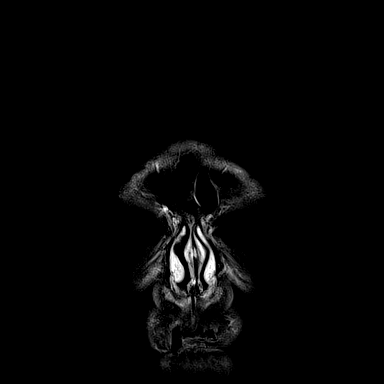
[im 29/29]
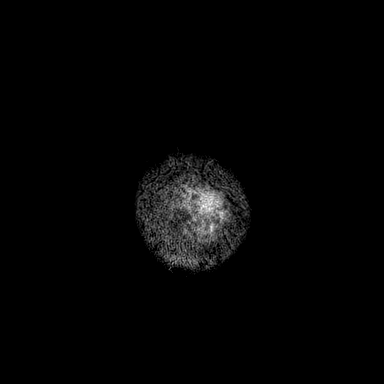

[45 of 48 positions shown; findings below may reference images not displayed]

FINDINGS: Brain:

Cerebral volume is normal for age.

Again demonstrated is extensive and fairly symmetric T2/FLAIR
hyperintense signal abnormality within the bilateral cerebral
hemispheres, most notably within the bilateral parietal, occipital
and temporal lobes. The signal abnormality is predominantly present
within the white matter, although with some areas of likely cortical
involvement. Also again demonstrated are numerous punctate foci of
SWI signal loss likely reflecting microhemorrhages within the
bilateral cerebral hemispheres, also with a bilateral parietal,
occipital and temporal lobe predominance. A few microhemorrhages are
also present within the cerebellum. This constellation of findings
is most consistent with posterior reversible encephalopathy syndrome
with hemorrhage.

Mild multifocal T2/FLAIR hyperintensity within the cerebral white
matter elsewhere, nonspecific but compatible chronic small vessel
ischemic disease.

No evidence of complicating acute infarct.

No evidence of an intracranial mass.

No extra-axial fluid collection.

No midline shift.

Vascular: Maintained flow voids within the proximal large arterial
vessels.

Skull and upper cervical spine: No focal suspicious marrow lesion.

Sinuses/Orbits: Visualized orbits show no acute finding. Trace
bilateral ethmoid sinus mucosal thickening.

Other: Left mastoid effusion. Redemonstrated 12 mm frontal scalp
lipoma.
IMPRESSION: No significant interval change in extensive and fairly symmetric
edema within the bilateral cerebral hemispheres (bilateral parietal,
occipital and temporal lobe predominance), and associated extensive
microhemorrhage. The constellation of findings is most consistent
with posterior reversible encephalopathy syndrome with hemorrhage.
The primary alternative consideration would be inflammatory cerebral
amyloid angiopathy, but this is considered significantly less likely
given the distribution of signal abnormality.

Background mild chronic small vessel ischemic disease within the
cerebral white matter.

Left mastoid effusion.

## 2021-06-20 MED ORDER — ENSURE ENLIVE PO LIQD: 237.0000 mL | Freq: Three times a day (TID) | ORAL | 0 refills | Status: AC

## 2021-06-20 MED ORDER — AMLODIPINE BESYLATE 5 MG PO TABS: 5.0000 mg | ORAL_TABLET | Freq: Every day | ORAL | 1 refills | Status: DC

## 2021-06-20 MED ORDER — THIAMINE HCL 100 MG/ML IJ SOLN: 100.0000 mg | Freq: Every day | INTRAMUSCULAR | 1 refills | Status: DC

## 2021-06-20 MED ORDER — ADULT MULTIVITAMIN W/MINERALS CH: 1.0000 | ORAL_TABLET | Freq: Every day | ORAL | 1 refills | Status: DC

## 2021-06-20 MED ORDER — LABETALOL HCL 200 MG PO TABS: 200.0000 mg | ORAL_TABLET | Freq: Two times a day (BID) | ORAL | 1 refills | Status: DC

## 2021-06-20 MED ORDER — THIAMINE HCL 100 MG PO TABS: 100.0000 mg | ORAL_TABLET | Freq: Every day | ORAL | 1 refills | Status: DC

## 2021-06-20 NOTE — Discharge Summary (Addendum)
PATIENT DETAILS Name: Mark Carr Age: 65 y.o. Sex: male Date of Birth: 05/29/1956 MRN: [id number]. Admitting Physician: Rhetta Mura, DO [id number], Dionne Bucy, MD  Admit Date: 06/16/2021 Discharge date: 06/20/2021  Recommendations for Outpatient Follow-up:  Follow up with PCP in 1-2 weeks Please obtain CMP/CBC in one week Please ensure follow-up with neurology-we will likely need repeat MRI brain-see below  Admitted From:  Home  Disposition: [place]: No  Equipment/Devices: None  Discharge Condition: Stable  CODE STATUS: FULL CODE  Diet recommendation:  Diet Order             Diet - low sodium heart healthy           DIET DYS 2 Room service appropriate? Yes with Assist; Fluid consistency: Thin  Diet effective now                    Brief Narrative: Patient is a 65 y.o. male cirrhosis, EtOH use, prior history of cocaine use-who presented with sudden onset of altered mental status-found to have PRES on neuroimaging.   Significant events: 8/8>> admit for confusion-PRES on MRI brain.   Significant studies: 8/8>> UDS: Positive for benzodiazepines 8/8>> CT head: Low density throughout the white matter in the posterior cerebral hemispheres-likely PRES. 8/9>> MRI brain: Bilateral parietal/occipital/temporal edema with extensive microhemorrhage 8/9>> EEG: No seizures. 8/12>> MRI brain: No significant interval change-continues to have significant symmetric edema with extensive microhemorrhage.   Antimicrobial therapy: None   Microbiology data: None   Procedures : None   Consults: Neurology.  Brief Hospital Course: Acute encephalopathy likely due to PRES: Encephalopathy has resolved-he is completely awake and alert-back to baseline.  BP significantly improved.  Discussed with neurologist-Dr. Danella Sensing for discharge-recommends outpatient neurology follow-up-and outpatient repeat MRI brain (have sent epic referral).   Likely  undiagnosed HTN: BP better controlled-continue oral labetalol/amlodipine.  PCP to optimize further.  History of alcohol use: Per daughter at bedside on 8/9-patient has cut down on daily EtOH intake-down to 2 bottles of beer.  Managed with Ativan per CIWA protocol-no signs of alcohol withdrawal.  Counseled to quit extensively.   Reported history of liver cirrhosis-probably due to alcohol use: Remains compensated.   Tobacco use disorder: Counseled when more awake and alert   History of cocaine use: Per family-patient has not done cocaine in the past several months-UDS without cocaine.  Counseled that he should continue to refrain from cocaine.   Recent COVID-19 infection: Currently asymptomatic-does not require any further isolation.  Procedures None  Discharge Diagnoses:  Principal Problem:   Acute encephalopathy Active Problems:   Tobacco use disorder   Alcohol use   Acute renal injury ([place])   Metabolic acidosis   Alcoholic cirrhosis of liver without ascites [facility])   Discharge Instructions:  Activity:  As tolerated   Discharge Instructions     Ambulatory referral to Neurology   Complete by: As directed    An appointment is requested in approximately: 2 weeks   Call MD for:  extreme fatigue   Complete by: As directed    Call MD for:  persistant dizziness or light-headedness   Complete by: As directed    Diet - low sodium heart healthy   Complete by: As directed    Discharge instructions   Complete by: As directed    1.  You will get a call from neurology office for a follow-up appointment, if you do not hear from them-please give them a call.  2.  Continue taking your blood pressure medications as prescribed-follow-up with your primary care practitioner in 1 week for further optimization.  3.  Continue to refrain from further cocaine use  4.  Continue to refrain from further alcohol use   Follow with Primary MD  Brita Romp Dionne Bucy, MD in 1-2 weeks  Please get a  complete blood count and chemistry panel checked by your Primary MD at your next visit, and again as instructed by your Primary MD.  Get Medicines reviewed and adjusted: Please take all your medications with you for your next visit with your Primary MD  Laboratory/radiological data: Please request your Primary MD to go over all hospital tests and procedure/radiological results at the follow up, please ask your Primary MD to get all Hospital records sent to his/her office.  In some cases, they will be blood work, cultures and biopsy results pending at the time of your discharge. Please request that your primary care M.D. follows up on these results.  Also Note the following: If you experience worsening of your admission symptoms, develop shortness of breath, life threatening emergency, suicidal or homicidal thoughts you must seek medical attention immediately by calling 911 or calling your MD immediately  if symptoms less severe.  You must read complete instructions/literature along with all the possible adverse reactions/side effects for all the Medicines you take and that have been prescribed to you. Take any new Medicines after you have completely understood and accpet all the possible adverse reactions/side effects.   Do not drive when taking Pain medications or sleeping medications (Benzodaizepines)  Do not take more than prescribed Pain, Sleep and Anxiety Medications. It is not advisable to combine anxiety,sleep and pain medications without talking with your primary care practitioner  Special Instructions: If you have smoked or chewed Tobacco  in the last 2 yrs please stop smoking, stop any regular Alcohol  and or any Recreational drug use.  Wear Seat belts while driving.  Please note: You were cared for by a hospitalist during your hospital stay. Once you are discharged, your primary care physician will handle any further medical issues. Please note that NO REFILLS for any discharge  medications will be authorized once you are discharged, as it is imperative that you return to your primary care physician (or establish a relationship with a primary care physician if you do not have one) for your post hospital discharge needs so that they can reassess your need for medications and monitor your lab values.   Increase activity slowly   Complete by: As directed       Allergies as of 06/20/2021   No Known Allergies      Medication List     TAKE these medications    amLODipine 5 MG tablet Commonly known as: NORVASC Take 1 tablet (5 mg total) by mouth daily. Start taking on: June 21, 2021   feeding supplement Liqd Take 237 mLs by mouth 3 (three) times daily between meals.   labetalol 200 MG tablet Commonly known as: NORMODYNE Take 1 tablet (200 mg total) by mouth 2 (two) times daily.   multivitamin with minerals Tabs tablet Take 1 tablet by mouth daily. Start taking on: June 21, 2021   tadalafil 20 MG tablet Commonly known as: CIALIS Take 1 tablet (20 mg total) by mouth every three (3) days as needed for erectile dysfunction.   thiamine 100 MG tablet Take 1 tablet (100 mg total) by mouth daily.  Durable Medical Equipment  (From admission, onward)           Start     Ordered   06/20/21 1103  For home use only DME 3 n 1  Once        06/20/21 1102   06/20/21 1103  For home use only DME Shower stool  Once        06/20/21 1102            Follow-up Information     Bacigalupo, Dionne Bucy, MD. Schedule an appointment as soon as possible for a visit in 1 week(s).   Specialty: Family Medicine Why: Hospital follow up Contact information: [address] [place] [address] [phone]         [facility] Follow up.   Why: Office will call with date/time, If you dont hear from them,please give them a call Contact information: [address]     [address] [place]  [phone]               No Known Allergies    Consultations:  Neuro   Other Procedures/Studies: CT HEAD WO CONTRAST (5MM)  Result Date: 06/16/2021 CLINICAL DATA:  Mental status change, unknown cause EXAM: CT HEAD WITHOUT CONTRAST TECHNIQUE: Contiguous axial images were obtained from the base of the skull through the vertex without intravenous contrast. COMPARISON:  04/01/2021 FINDINGS: Brain: There is low-density within the white matter of the posterior cerebral hemispheres bilaterally, left greater than right. Favor PRES. No hemorrhage or hydrocephalus. Vascular: No hyperdense vessel or unexpected calcification. Skull: No acute calvarial abnormality. Sinuses/Orbits: No acute findings Other: None IMPRESSION: Low-density throughout the white matter of the posterior cerebral hemispheres bilaterally most pronounced in the left occipital lobe. Favor PRES. No definite cortical infarct, hemorrhage or hydrocephalus. This could be further evaluated with MRI if felt clinically indicated. Electronically Signed   By: Rolm Baptise M.D.   On: 06/16/2021 17:18   MR BRAIN WO CONTRAST  Result Date: 06/20/2021 CLINICAL DATA:  Mental status change, unknown cause. EXAM: MRI HEAD WITHOUT CONTRAST TECHNIQUE: Multiplanar, multiecho pulse sequences of the brain and surrounding structures were obtained without intravenous contrast. COMPARISON:  Brain MRI 06/17/2021. FINDINGS: Brain: Cerebral volume is normal for age. Again demonstrated is extensive and fairly symmetric T2/FLAIR hyperintense signal abnormality within the bilateral cerebral hemispheres, most notably within the bilateral parietal, occipital and temporal lobes. The signal abnormality is predominantly present within the white matter, although with some areas of likely cortical involvement. Also again demonstrated are numerous punctate foci of SWI signal loss likely reflecting microhemorrhages within the bilateral cerebral hemispheres, also  with a bilateral parietal, occipital and temporal lobe predominance. A few microhemorrhages are also present within the cerebellum. 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 Mild multifocal T2/FLAIR hyperintensity within the cerebral white matter elsewhere, nonspecific but compatible chronic small vessel ischemic disease. No evidence of complicating acute infarct. No evidence of an intracranial mass. No extra-axial fluid collection. No midline shift. Vascular: Maintained flow voids within the proximal large arterial vessels. Skull and upper cervical spine: No focal suspicious marrow lesion. Sinuses/Orbits: Visualized orbits show no acute finding. Trace bilateral ethmoid sinus mucosal thickening. Other: Left mastoid effusion. Redemonstrated 12 mm frontal scalp lipoma. IMPRESSION: No significant interval change in extensive and fairly symmetric edema within the bilateral cerebral hemispheres (bilateral parietal, occipital and temporal lobe predominance), and associated extensive microhemorrhage. The constellation of findings is most  consistent with posterior reversible encephalopathy syndrome with hemorrhage. The primary alternative consideration would be inflammatory cerebral amyloid angiopathy, but this is considered significantly less likely given the distribution of signal abnormality. Background mild chronic small vessel ischemic disease within the cerebral white matter. Left mastoid effusion. Electronically Signed   By: Kellie Simmering DO   On: 06/20/2021 08:46   MR BRAIN WO CONTRAST  Result Date: 06/17/2021 CLINICAL DATA:  Mental status change, unknown cause EXAM: MRI HEAD WITHOUT CONTRAST TECHNIQUE: Multiplanar, multiecho pulse sequences of the brain and surrounding structures were obtained without intravenous contrast. COMPARISON:  Correlation made with CT head 06/16/2021 FINDINGS: Motion artifact is present. Brain: There is T2 FLAIR  hyperintensity in the parietal, occipital, and temporal lobes involving the white matter. Cortex appears to be spared. There is no associated reduced diffusion. Corresponding susceptibility is present likely reflecting areas of extensive microhemorrhage. Mild mass effect is present with effacement of the posterior left lateral ventricle. No extra-axial collection. Additional areas of T2 hyperintensity in the supratentorial white matter may reflect chronic microvascular ischemic changes. Vascular: Major vessel flow voids at the skull base are preserved. Skull and upper cervical spine: Normal marrow signal is preserved. Sinuses/Orbits: Paranasal sinuses are aerated. Orbits are unremarkable. Other: Sella is unremarkable. Patchy left mastoid fluid opacification. IMPRESSION: Motion degraded study. Bilateral parietal, occipital, and temporal edema with extensive microhemorrhage. Minor mass effect. 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 Electronically Signed   By: Macy Mis M.D.   On: 06/17/2021 10:33   DG Chest Port 1 View  Result Date: 06/16/2021 CLINICAL DATA:  Acute encephalopathy EXAM: PORTABLE CHEST 1 VIEW COMPARISON:  The 04/19/2021 FINDINGS: Left base atelectasis. Right lung clear. No effusions. Heart is borderline in size. No acute bony abnormality. IMPRESSION: Left base atelectasis. Electronically Signed   By: Rolm Baptise M.D.   On: 06/16/2021 21:52   EEG adult  Result Date: 06/17/2021 Lora Havens, MD     06/17/2021  5:38 PM Patient Name: Mark Carr MRN: [id number] Epilepsy Attending: Lora Havens Referring Physician/Provider: Dr Oren Binet Date: 06/17/2021 Duration: 23.53 mins Patient history:64 y.o. male cirrhosis, EtOH use, prior history of cocaine use-who presented with sudden onset of altered mental status-found to have PRES on neuroimaging. EEG to evaluate for seizure. Level of alertness: Awake AEDs during EEG study: Ativan Technical aspects: This EEG  study was done with scalp electrodes positioned according to the 10-20 International system of electrode placement. Electrical activity was acquired at a sampling rate of '500Hz'$  and reviewed with a high frequency filter of '70Hz'$  and a low frequency filter of '1Hz'$ . EEG data were recorded continuously and digitally stored. Description: EEG showed continuous generalized 3 to 6 Hz theta-delta slowing with an excessive amount of 15 to 18 Hz beta activity distributed symmetrically and diffusely. Hyperventilation and photic stimulation were not performed.   ABNORMALITY - Continuous slow, generalized - Excessive beta, generalized IMPRESSION: This study is suggestive of moderate diffuse encephalopathy, nonspecific etiology. The excessive beta activity seen in the background is most likely due to the effect of benzodiazepine and is a benign EEG pattern. No seizures or epileptiform discharges were seen throughout the recording. Priyanka Barbra Sarks     TODAY-DAY OF DISCHARGE:  Subjective:   Mark Carr today has no headache,no chest abdominal pain,no new weakness tingling or numbness, feels much better wants to go home today.  Objective:   Blood pressure (!) 118/96, pulse 69, temperature 97.8 F (36.6 C), resp. rate 16, weight 53.7 kg,  SpO2 100 %.  Intake/Output Summary (Last 24 hours) at 06/20/2021 1120 Last data filed at 06/20/2021 0100 Gross per 24 hour  Intake 237 ml  Output 500 ml  Net -263 ml   Filed Weights   06/19/21 0355  Weight: 53.7 kg    Exam: Awake Alert, Oriented *3, No new F.N deficits, Normal affect [place].AT,PERRAL Supple Neck,No JVD, No cervical lymphadenopathy appriciated.  Symmetrical Chest wall movement, Good air movement bilaterally, CTAB RRR,No Gallops,Rubs or new Murmurs, No Parasternal Heave +ve B.Sounds, Abd Soft, Non tender, No organomegaly appriciated, No rebound -guarding or rigidity. No Cyanosis, Clubbing or edema, No new Rash or bruise   PERTINENT RADIOLOGIC STUDIES: MR  BRAIN WO CONTRAST  Result Date: 06/20/2021 CLINICAL DATA:  Mental status change, unknown cause. EXAM: MRI HEAD WITHOUT CONTRAST TECHNIQUE: Multiplanar, multiecho pulse sequences of the brain and surrounding structures were obtained without intravenous contrast. COMPARISON:  Brain MRI 06/17/2021. FINDINGS: Brain: Cerebral volume is normal for age. Again demonstrated is extensive and fairly symmetric T2/FLAIR hyperintense signal abnormality within the bilateral cerebral hemispheres, most notably within the bilateral parietal, occipital and temporal lobes. The signal abnormality is predominantly present within the white matter, although with some areas of likely cortical involvement. Also again demonstrated are numerous punctate foci of SWI signal loss likely reflecting microhemorrhages within the bilateral cerebral hemispheres, also with a bilateral parietal, occipital and temporal lobe predominance. A few microhemorrhages are also present within the cerebellum. 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 Mild multifocal T2/FLAIR hyperintensity within the cerebral white matter elsewhere, nonspecific but compatible chronic small vessel ischemic disease. No evidence of complicating acute infarct. No evidence of an intracranial mass. No extra-axial fluid collection. No midline shift. Vascular: Maintained flow voids within the proximal large arterial vessels. Skull and upper cervical spine: No focal suspicious marrow lesion. Sinuses/Orbits: Visualized orbits show no acute finding. Trace bilateral ethmoid sinus mucosal thickening. Other: Left mastoid effusion. Redemonstrated 12 mm frontal scalp lipoma. IMPRESSION: No significant interval change in extensive and fairly symmetric edema within the bilateral cerebral hemispheres (bilateral parietal, occipital and temporal lobe predominance), and associated extensive microhemorrhage. 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 The primary alternative consideration would be inflammatory cerebral amyloid angiopathy, but this is considered significantly less likely given the distribution of signal abnormality. Background mild chronic small vessel ischemic disease within the cerebral white matter. Left mastoid effusion. Electronically Signed   By: Kellie Simmering DO   On: 06/20/2021 08:46     PERTINENT LAB RESULTS: CBC: Recent Labs    06/18/21 0500 06/19/21 0518  WBC 5.9 5.7  HGB 13.8 12.6*  HCT 38.4* 34.0*  PLT 136* 139*   CMET CMP     Component Value Date/Time   NA 141 06/20/2021 0642   NA 134 (L) 05/07/2012 0855   K 3.6 06/20/2021 0642   K 4.4 05/07/2012 0855   CL 108 06/20/2021 0642   CL 104 05/07/2012 0855   CO2 25 06/20/2021 0642   CO2 21 05/07/2012 0855   GLUCOSE 98 06/20/2021 0642   GLUCOSE 91 05/07/2012 0855   BUN 12 06/20/2021 0642   BUN 11 05/07/2012 0855   CREATININE 0.68 06/20/2021 0642   CREATININE 0.71 05/07/2012 0855   CALCIUM 9.2 06/20/2021 0642   CALCIUM 9.0 05/07/2012 0855   PROT 7.5 06/17/2021 0335   PROT 8.5 (H) 05/07/2012 0855   ALBUMIN 3.8 06/17/2021 0335   ALBUMIN 4.2  05/07/2012 0855   AST 43 (H) 06/17/2021 0335   AST 53 (H) 05/07/2012 0855   ALT 18 06/17/2021 0335   ALT 39 05/07/2012 0855   ALKPHOS 75 06/17/2021 0335   ALKPHOS 105 05/07/2012 0855   BILITOT 1.4 (H) 06/17/2021 0335   BILITOT 2.6 (H) 05/07/2012 0855   GFRNONAA >60 06/20/2021 0642   GFRNONAA >60 05/07/2012 0855   GFRAA >60 10/28/2018 0905   GFRAA >60 05/07/2012 0855    GFR Estimated Creatinine Clearance: 70.9 mL/min (by C-G formula based on SCr of 0.68 mg/dL). No results for input(s): LIPASE, AMYLASE in the last 72 hours. No results for input(s): CKTOTAL, CKMB, CKMBINDEX, TROPONINI in the last 72 hours. Invalid input(s): POCBNP No results for input(s): DDIMER in the last 72 hours. No results for input(s):  HGBA1C in the last 72 hours. No results for input(s): CHOL, HDL, LDLCALC, TRIG, CHOLHDL, LDLDIRECT in the last 72 hours. No results for input(s): TSH, T4TOTAL, T3FREE, THYROIDAB in the last 72 hours.  Invalid input(s): FREET3 No results for input(s): VITAMINB12, FOLATE, FERRITIN, TIBC, IRON, RETICCTPCT in the last 72 hours. Coags: No results for input(s): INR in the last 72 hours.  Invalid input(s): PT Microbiology: Recent Results (from the past 240 hour(s))  MRSA Next Gen by PCR, Nasal     Status: None   Collection Time: 06/18/21  5:07 PM   Specimen: Nasal Mucosa; Nasal Swab  Result Value Ref Range Status   MRSA by PCR Next Gen NOT DETECTED NOT DETECTED Final    Comment: (NOTE) The GeneXpert MRSA Assay (FDA approved for NASAL specimens only), is one component of a comprehensive MRSA colonization surveillance program. It is not intended to diagnose MRSA infection nor to guide or monitor treatment for MRSA infections. Test performance is not FDA approved in patients less than 16 years old. Performed at [facility], [address] [place], [place] [address]     FURTHER DISCHARGE INSTRUCTIONS:  Get Medicines reviewed and adjusted: Please take all your medications with you for your next visit with your Primary MD  Laboratory/radiological data: Please request your Primary MD to go over all hospital tests and procedure/radiological results at the follow up, please ask your Primary MD to get all Hospital records sent to his/her office.  In some cases, they will be blood work, cultures and biopsy results pending at the time of your discharge. Please request that your primary care M.D. goes through all the records of your hospital data and follows up on these results.  Also Note the following: If you experience worsening of your admission symptoms, develop shortness of breath, life threatening emergency, suicidal or homicidal thoughts you must seek medical attention  immediately by calling 911 or calling your MD immediately  if symptoms less severe.  You must read complete instructions/literature along with all the possible adverse reactions/side effects for all the Medicines you take and that have been prescribed to you. Take any new Medicines after you have completely understood and accpet all the possible adverse reactions/side effects.   Do not drive when taking Pain medications or sleeping medications (Benzodaizepines)  Do not take more than prescribed Pain, Sleep and Anxiety Medications. It is not advisable to combine anxiety,sleep and pain medications without talking with your primary care practitioner  Special Instructions: If you have smoked or chewed Tobacco  in the last 2 yrs please stop smoking, stop any regular Alcohol  and or any Recreational drug use.  Wear Seat belts while driving.  Please  note: You were cared for by a hospitalist during your hospital stay. Once you are discharged, your primary care physician will handle any further medical issues. Please note that NO REFILLS for any discharge medications will be authorized once you are discharged, as it is imperative that you return to your primary care physician (or establish a relationship with a primary care physician if you do not have one) for your post hospital discharge needs so that they can reassess your need for medications and monitor your lab values.  Total Time spent coordinating discharge including counseling, education and face to face time equals 35 minutes.  SignedOren Binet 06/20/2021 11:20 AM

## 2021-06-20 NOTE — Telephone Encounter (Signed)
Copied from [place] [id number]. Topic: Appointment Scheduling - Scheduling Inquiry for Clinic >> Jun 20, 2021  3:55 PM Tessa Lerner A wrote: Reason for CRM: Patient was discharged from [facility] 06/20/21  Patient was seen for blood pressure concerns   The patient has been directed to contact their PCP to schedule a [place] was unable to successfully schedule at the time of call  Please contact further when possible

## 2021-06-20 NOTE — Progress Notes (Signed)
Occupational Therapy Treatment Patient Details Name: Mark Carr MRN: [id number] DOB: [birth year] Today's Date: 06/20/2021    History of present illness Pt is a 65 y/o M with PMH: liver cirrhosis, alcohol abuse, tobacco abuse, prior history of cocaine use-who presented with sudden onset of altered mental status. 8/8: CT head shows low density throughout the white matter in the posterior cerebral hemispheres-likely PRES.  8/9:  MRI brain shows Bilateral parietal/occipital/temporal edema with extensive microhemorrhage8/10: EEG demos diffuse encephalopathy, nonspecific etiology, no seizure or epiletiform discharges.   OT comments  Mark Carr was seen for OT treatment on this date. Upon arrival to room pt seated EOB with RN, agreeable to session. Pt requires SUPERVISION for toilet t/f and standing hand washing. INDEPENDENT don/doff B socks seated EOC. Pt completes 1 set x 20 reps each: chair pushups, wall pushups, squats, single leg squats, high knees with no LOBs or unsteadiness noted. Pt instructed on d/c recs, falls prevention, and home/routines modifications. All goals met and education complete, no further skilled acute OT needs. Will sign off, discharge recommendation updated to reflect pt progress.     Follow Up Recommendations  No OT follow up;Supervision - Intermittent    Equipment Recommendations  Tub/shower seat    Recommendations for Other Services      Precautions / Restrictions Precautions Precautions: Fall Restrictions Weight Bearing Restrictions: No       Mobility Bed Mobility               General bed mobility comments: received EOB left in chair    Transfers Overall transfer level: Needs assistance Equipment used: None Transfers: Sit to/from Stand Sit to Stand: Supervision              Balance Overall balance assessment: Modified Independent             Standing balance comment: single UE support for single leg stance. No UE support dynamic  standing                           ADL either performed or assessed with clinical judgement   ADL Overall ADL's : Needs assistance/impaired                                       General ADL Comments: SUPERVISION for toilet t/f and standing hand washing. INDEPENDENT don/doff B socks seated EOC.      Cognition Arousal/Alertness: Awake/alert Behavior During Therapy: WFL for tasks assessed/performed Overall Cognitive Status: Within Functional Limits for tasks assessed                                          Exercises Exercises: Other exercises Other Exercises Other Exercises: Pt educated re: falls prevention, d/c recs, HEP Other Exercises: LBD, toileting, hadn washing, sit<>stand x2, sitting/standing balance/tolerance Other Exercises: 1 set x 20 reps each: chair pushups, wall pushups, squats, single leg squats, high knees      General Comments HR 100 with activity    Pertinent Vitals/ Pain       Pain Assessment: No/denies pain         Frequency  Min 2X/week        Progress Toward Goals  OT Goals(current goals can now be found in the care plan section)  Progress towards OT goals: Goals met/education completed, patient discharged from OT  Acute Rehab OT Goals Patient Stated Goal: to go home OT Goal Formulation: With patient Time For Goal Achievement: 07/02/21 Potential to Achieve Goals: Good ADL Goals Pt Will Perform Grooming: with min assist;sitting Pt Will Perform Lower Body Dressing: with min assist;with mod assist;sitting/lateral leans Pt Will Transfer to Toilet: with min assist;with mod assist;bedside commode Pt Will Perform Toileting - Clothing Manipulation and hygiene: with mod assist;sitting/lateral leans  Plan All goals met and education completed, patient discharged from OT services       AM-PAC OT "6 Clicks" Daily Activity     Outcome Measure   Help from another person eating meals?: None Help from  another person taking care of personal grooming?: None Help from another person toileting, which includes using toliet, bedpan, or urinal?: A Little Help from another person bathing (including washing, rinsing, drying)?: A Little Help from another person to put on and taking off regular upper body clothing?: None Help from another person to put on and taking off regular lower body clothing?: None 6 Click Score: 22    End of Session    OT Visit Diagnosis: Unsteadiness on feet (R26.81);Muscle weakness (generalized) (M62.81)   Activity Tolerance Patient tolerated treatment well   Patient Left in chair;with call bell/phone within reach;with chair alarm set   Nurse Communication Mobility status        Time: 1423-9532 OT Time Calculation (min): 25 min  Charges: OT General Charges $OT Visit: 1 Visit OT Treatments $Self Care/Home Management : 8-22 mins $Therapeutic Exercise: 8-22 mins  Dessie Coma, M.S. OTR/L  06/20/21, 9:18 AM  ascom [phone]

## 2021-06-20 NOTE — TOC Transition Note (Signed)
Transition of Care [facility]) - CM/SW Discharge Note   Patient Details  Name: Mark Carr MRN: [id number] Date of Birth: 1956/07/25  Transition of Care [facility]) CM/SW Contact:  Shelbie Hutching, RN Phone Number: 06/20/2021, 11:15 AM   Clinical Narrative:     Patient admitted to the hospital with acute encephalopathy now medically cleared for discharge home.  RNCM met with patient and patient's wife at the bedside, daughter is also in the room.  Patient is from home with his wife and son.  Patient is baseline independent and requires not assistive devices.  PT and OT are recommending home health PT and OT.  Daughter chooses [facility] for [facility] services.  Cory with [facility] accepted referral.  OT recommending 3 in 1 and shower stool.  DME ordered from [employer] and will be delivered to the room before discharge.  Patient and wife do not drive so daughter will provide transportation home.  Patient is current with PCP at [facility].    Final next level of care: [place] Barriers to Discharge: Barriers Resolved   Patient Goals and CMS Choice Patient states their goals for this hospitalization and ongoing recovery are:: Patient glad to go home CMS Medicare.gov Compare Post Acute Care list provided to:: Patient Choice offered to / list presented to : Patient, Adult Children  Discharge Placement                       Discharge Plan and Services   Discharge Planning Services: CM Consult Post Acute Care Choice: Home Health          DME Arranged: 3-N-1, Shower stool DME Agency: [employer] Date DME Agency Contacted: 06/20/21 Time DME Agency Contacted: 1112 Representative spoke with at DME Agency: Suanne Marker HH Arranged: PT, OT [place] Agency: [place] Date [place]: 06/20/21 Time [place]: 1114 Representative spoke with at [place]: [place] ([place]) Interventions     Readmission Risk Interventions No  flowsheet data found.

## 2021-06-23 ENCOUNTER — Telehealth: Payer: Self-pay

## 2021-06-23 DIAGNOSIS — R4182 Altered mental status, unspecified: Secondary | ICD-10-CM

## 2021-06-23 NOTE — Telephone Encounter (Signed)
Ideally find something within 2 wks of discharge. If I don't have availability, maybe Daneil Dan or Simona Huh do?

## 2021-06-23 NOTE — Telephone Encounter (Signed)
Copied from [place] [id number]. Topic: Referral - Status >> Jun 23, 2021  3:53 PM Erick Blinks wrote: Reason for CRM: Pt's daughter is requesting to have a different referral for somewhere closer than [place] [facility] Ivar Drape from [facility].  Best contact [phone]

## 2021-07-07 ENCOUNTER — Other Ambulatory Visit: Payer: Self-pay

## 2021-07-07 ENCOUNTER — Encounter: Payer: Self-pay | Admitting: Family Medicine

## 2021-07-07 ENCOUNTER — Ambulatory Visit (INDEPENDENT_AMBULATORY_CARE_PROVIDER_SITE_OTHER): Payer: Medicare Other | Admitting: Family Medicine

## 2021-07-07 VITALS — BP 122/78 | HR 77 | Temp 97.9°F | Wt 124.0 lb

## 2021-07-07 DIAGNOSIS — K7030 Alcoholic cirrhosis of liver without ascites: Secondary | ICD-10-CM

## 2021-07-07 DIAGNOSIS — E876 Hypokalemia: Secondary | ICD-10-CM | POA: Diagnosis not present

## 2021-07-07 DIAGNOSIS — G9340 Encephalopathy, unspecified: Secondary | ICD-10-CM

## 2021-07-07 DIAGNOSIS — E871 Hypo-osmolality and hyponatremia: Secondary | ICD-10-CM | POA: Diagnosis not present

## 2021-07-07 MED ORDER — THIAMINE HCL 100 MG PO TABS: 100.0000 mg | ORAL_TABLET | Freq: Every day | ORAL | 1 refills | Status: DC

## 2021-07-07 MED ORDER — AMLODIPINE BESYLATE 5 MG PO TABS
5.0000 mg | ORAL_TABLET | Freq: Every day | ORAL | 1 refills | Status: DC
Start: 2021-07-07 — End: 2021-09-16

## 2021-07-07 MED ORDER — LABETALOL HCL 200 MG PO TABS: 200.0000 mg | ORAL_TABLET | Freq: Two times a day (BID) | ORAL | 1 refills | Status: DC

## 2021-07-07 NOTE — Progress Notes (Signed)
Established patient visit   Patient: Mark Carr   DOB: [birth year]   65 y.o. Male  MRN: [id number] Visit Date: 07/07/2021  Today's healthcare provider: Vernie Murders, PA-C   No chief complaint on file.  Subjective  -------------------------------------------------------------------------------------------------------------------- HPI   Admit Date: 06/16/2021 Discharge date: 06/20/2021  Brief Narrative: Patient is a 65 y.o. male cirrhosis, EtOH use, prior history of cocaine use-who presented with sudden onset of altered mental status-found to have PRES on neuroimaging.  Brief Hospital Course: Acute encephalopathy likely due to PRES: Encephalopathy has resolved-he is completely awake and alert-back to baseline.  BP significantly improved.  Discussed with neurologist-Dr. Danella Sensing for discharge-recommends outpatient neurology follow-up-and outpatient repeat MRI brain (have sent epic referral).   Likely undiagnosed HTN: BP better controlled-continue oral labetalol/amlodipine.  PCP to optimize further.   History of alcohol use: Per daughter at bedside on 8/9-patient has cut down on daily EtOH intake-down to 2 bottles of beer.  Managed with Ativan per CIWA protocol-no signs of alcohol withdrawal.  Counseled to quit extensively.   Reported history of liver cirrhosis-probably due to alcohol use: Remains compensated.   Tobacco use disorder: Counseled when more awake and alert   History of cocaine use: Per family-patient has not done cocaine in the past several months-UDS without cocaine.  Counseled that he should continue to refrain from cocaine.   Recent COVID-19 infection: Currently asymptomatic-does not require any further isolation.  Discharge Diagnoses:  Principal Problem:   Acute encephalopathy Active Problems:   Tobacco use disorder   Alcohol use   Acute renal injury ([place])   Metabolic acidosis   Alcoholic cirrhosis of liver without ascites ([place])  Recommendations  for Outpatient Follow-up:  Follow up with PCP in 1-2 weeks Please obtain CMP/CBC in one week Please ensure follow-up with neurology-we will likely need repeat MRI brain-see below.    Patient has not seen neurology yet the first available appointment is in November.  Past Medical History:  Diagnosis Date   Cirrhosis of liver ([place])    Wrist fracture    Past Surgical History:  Procedure Laterality Date   WRIST FRACTURE SURGERY Right 2016   Family History  Problem Relation Age of Onset   Hypertension Mother    Deep vein thrombosis Mother    Cancer Sister        unknown type   Heart disease Brother    Cancer Maternal Grandmother    Social History   Tobacco Use   Smoking status: Every Day    Packs/day: 0.50    Years: 40.00    Pack years: 20.00    Types: Cigarettes   Smokeless tobacco: Never  Vaping Use   Vaping Use: Never used  Substance Use Topics   Alcohol use: Yes    Alcohol/week: 4.0 standard drinks    Types: 4 Cans of beer per week    Comment: 4 beers a day   Drug use: Not Currently   No Known Allergies  Medications: Outpatient Medications Prior to Visit  Medication Sig   amLODipine (NORVASC) 5 MG tablet Take 1 tablet (5 mg total) by mouth daily.   feeding supplement (ENSURE ENLIVE / ENSURE PLUS) LIQD Take 237 mLs by mouth 3 (three) times daily between meals.   labetalol (NORMODYNE) 200 MG tablet Take 1 tablet (200 mg total) by mouth 2 (two) times daily.   Multiple Vitamin (MULTIVITAMIN WITH MINERALS) TABS tablet Take 1 tablet by mouth daily.   thiamine 100 MG tablet Take 1  tablet (100 mg total) by mouth daily.   tadalafil (CIALIS) 20 MG tablet Take 1 tablet (20 mg total) by mouth every three (3) days as needed for erectile dysfunction. (Patient not taking: Reported on 07/07/2021)   No facility-administered medications prior to visit.    Review of Systems  Respiratory:  Negative for chest tightness, shortness of breath and wheezing.   Cardiovascular:   Negative for chest pain, palpitations and leg swelling.  Psychiatric/Behavioral:  Negative for confusion, decreased concentration, hallucinations and sleep disturbance. The patient is not nervous/anxious and is not hyperactive.       Objective  -------------------------------------------------------------------------------------------------------------------- BP 122/78 (BP Location: Left Arm, Patient Position: Sitting, Cuff Size: Normal)   Pulse 77   Temp 97.9 F (36.6 C) (Oral)   Wt 124 lb (56.2 kg)   SpO2 100%   BMI 18.85 kg/m  BP Readings from Last 3 Encounters:  07/07/21 122/78  06/20/21 134/76  05/13/21 (!) 133/91   Wt Readings from Last 3 Encounters:  07/07/21 124 lb (56.2 kg)  06/19/21 118 lb 6.2 oz (53.7 kg)  05/13/21 124 lb 12.8 oz (56.6 kg)    Physical Exam Constitutional:      General: He is not in acute distress.    Appearance: He is well-developed.  HENT:     Head: Normocephalic and atraumatic.     Right Ear: Hearing and tympanic membrane normal.     Left Ear: Hearing and tympanic membrane normal.     Nose: Nose normal.     Mouth/Throat:     Mouth: Mucous membranes are moist.     Pharynx: Oropharynx is clear.  Eyes:     General: Lids are normal. No scleral icterus.       Right eye: No discharge.        Left eye: No discharge.     Conjunctiva/sclera: Conjunctivae normal.  Cardiovascular:     Rate and Rhythm: Normal rate and regular rhythm.     Pulses: Normal pulses.     Heart sounds: Normal heart sounds.  Pulmonary:     Effort: Pulmonary effort is normal. No respiratory distress.     Breath sounds: Normal breath sounds.  Abdominal:     General: Bowel sounds are normal.     Palpations: Abdomen is soft.  Musculoskeletal:        General: Normal range of motion.     Cervical back: Normal range of motion and neck supple.  Skin:    Findings: No lesion or rash.  Neurological:     Mental Status: He is alert and oriented to person, place, and time.   Psychiatric:        Speech: Speech normal.        Behavior: Behavior normal.        Thought Content: Thought content normal.      No results found for any visits on 07/07/21.  Assessment & Plan  ---------------------------------------------------------------------------------------------------------------------- 1. Acute encephalopathy Oriented and back to baseline now. Will recheck labs and encouraged to follow up with neurologist (Dr. Manuella Ghazi). - CBC with Differential/Platelet - Comprehensive metabolic panel - [place]  2. Alcoholic cirrhosis of liver without ascites (HCC) No significant edema today. Continue Thiamine daily and work on cessation of ETOH. No withdrawal symptoms today. Recheck labs and may need evaluation with gastroenterologist. No jaundice or liver flap. - CBC with Differential/Platelet - Comprehensive metabolic panel - [place]  3. Hyponatremia History of low sodium levels in the past. During the most recent hospitalization, sodium rebounded to  141. Recheck levels. - Comprehensive metabolic panel  4. Hypokalemia On 06-19-21 potassium dropped to 3.4 but returned to 3.6 the next day. Recheck CMP. - Comprehensive metabolic panel   No follow-ups on file.      I, Laraine Samet, PA-C, have reviewed all documentation for this visit. The documentation on 07/07/21 for the exam, diagnosis, procedures, and orders are all accurate and complete.    Vernie Murders, PA-C  [employer] [phone] (phone) [phone] (fax)  [place]

## 2021-07-11 ENCOUNTER — Telehealth: Payer: Self-pay

## 2021-07-11 NOTE — Telephone Encounter (Signed)
Copied from [place] [id number]. Topic: General - Other >> Jul 11, 2021  1:58 PM Leward Quan A wrote: Reason for CRM: Patient daughter / caregiver wanted to inform nurse that she have not heard anything about the MRI. Please advise Ph# [phone]

## 2021-07-11 NOTE — Telephone Encounter (Signed)
Copied from [place] [id number]. Topic: General - Other >> Jul 11, 2021  1:58 PM Leward Quan A wrote: Reason for CRM: Patient daughter / caregiver wanted to inform nurse that she have not heard anything about the MRI. Please advise Ph# [phone]

## 2021-07-13 LAB — CBC WITH DIFFERENTIAL/PLATELET
Basophils Absolute: 0 10*3/uL (ref 0.0–0.2)
Basos: 1 %
EOS (ABSOLUTE): 0.1 10*3/uL (ref 0.0–0.4)
Eos: 2 %
Hematocrit: 39.6 % (ref 37.5–51.0)
Hemoglobin: 13.3 g/dL (ref 13.0–17.7)
Immature Grans (Abs): 0 10*3/uL (ref 0.0–0.1)
Immature Granulocytes: 0 %
Lymphocytes Absolute: 1.6 10*3/uL (ref 0.7–3.1)
Lymphs: 42 %
MCH: 32 pg (ref 26.6–33.0)
MCHC: 33.6 g/dL (ref 31.5–35.7)
MCV: 95 fL (ref 79–97)
Monocytes Absolute: 0.3 10*3/uL (ref 0.1–0.9)
Monocytes: 8 %
Neutrophils Absolute: 1.8 10*3/uL (ref 1.4–7.0)
Neutrophils: 47 %
Platelets: 142 10*3/uL — ABNORMAL LOW (ref 150–450)
RBC: 4.16 x10E6/uL (ref 4.14–5.80)
RDW: 13.2 % (ref 11.6–15.4)
WBC: 3.9 10*3/uL (ref 3.4–10.8)

## 2021-07-13 LAB — COMPREHENSIVE METABOLIC PANEL
ALT: 19 IU/L (ref 0–44)
AST: 32 IU/L (ref 0–40)
Albumin/Globulin Ratio: 1.4 (ref 1.2–2.2)
Albumin: 4.3 g/dL (ref 3.8–4.8)
Alkaline Phosphatase: 96 IU/L (ref 44–121)
BUN/Creatinine Ratio: 14 (ref 10–24)
BUN: 11 mg/dL (ref 8–27)
Bilirubin Total: 0.6 mg/dL (ref 0.0–1.2)
CO2: 18 mmol/L — ABNORMAL LOW (ref 20–29)
Calcium: 9.6 mg/dL (ref 8.6–10.2)
Chloride: 103 mmol/L (ref 96–106)
Creatinine, Ser: 0.76 mg/dL (ref 0.76–1.27)
Globulin, Total: 3.1 g/dL (ref 1.5–4.5)
Glucose: 91 mg/dL (ref 65–99)
Potassium: 4.2 mmol/L (ref 3.5–5.2)
Sodium: 137 mmol/L (ref 134–144)
Total Protein: 7.4 g/dL (ref 6.0–8.5)
eGFR: 100 mL/min/{1.73_m2} (ref >59)

## 2021-07-13 LAB — AMMONIA: Ammonia: 61 ug/dL (ref 40–200)

## 2021-07-15 ENCOUNTER — Ambulatory Visit: Payer: Self-pay | Admitting: *Deleted

## 2021-07-15 ENCOUNTER — Ambulatory Visit: Payer: Self-pay | Admitting: Family Medicine

## 2021-07-15 NOTE — Telephone Encounter (Signed)
Called patient to review lab results. Patient's daughter answered phone and reports she is on the back to the office and she will just get information once at office. No lab results reviewed with patient or family by NT.

## 2021-07-16 NOTE — Telephone Encounter (Signed)
First MRI on 06-16-21 had to be repeated due to patient unable to be still during the exam. 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 During exam in the office on on 07-07-21, the altered mental status seemed to have resolved and he was back to his baseline status. Neurology referral for follow up was rescheduled as requested by family to a specialist in [place]. If he is having more confusion or disorientation and the neurology appointment won't be accomplished soon, he may have to return to the ER or PCP for reassessment of his condition.

## 2021-07-16 NOTE — Telephone Encounter (Signed)
You will have to check with scheduling staff that was changing the referral to a neurologist in [place].

## 2021-07-29 DIAGNOSIS — R413 Other amnesia: Secondary | ICD-10-CM | POA: Insufficient documentation

## 2021-07-29 DIAGNOSIS — I6783 Posterior reversible encephalopathy syndrome: Secondary | ICD-10-CM | POA: Insufficient documentation

## 2021-07-29 DIAGNOSIS — I10 Essential (primary) hypertension: Secondary | ICD-10-CM | POA: Insufficient documentation

## 2021-09-16 ENCOUNTER — Other Ambulatory Visit: Payer: Self-pay | Admitting: Family Medicine

## 2021-09-16 MED ORDER — TADALAFIL 20 MG PO TABS: 20.0000 mg | ORAL_TABLET | ORAL | 2 refills | Status: DC | PRN

## 2021-09-16 MED ORDER — AMLODIPINE BESYLATE 5 MG PO TABS: 5.0000 mg | ORAL_TABLET | Freq: Every day | ORAL | 1 refills | Status: DC

## 2021-09-16 NOTE — Telephone Encounter (Signed)
Medication Refill - Medication: Amlodipine 5 mg,  Tadalifil 20 mg  Has the patient contacted their pharmacy? Yes.  Pt said she did and they old her to call prescriber (Agent: If no, request that the patient contact the pharmacy for the refill. If patient does not wish to contact the pharmacy document the reason why and proceed with request.) (Agent: If yes, when and what did the pharmacy advise?)  Preferred Pharmacy (with phone number or street name): [facility]   Has the patient been seen for an appointment in the last year OR does the patient have an upcoming appointment? yes  Agent: Please be advised that RX refills may take up to 3 business days. We ask that you follow-up with your pharmacy.

## 2021-09-16 NOTE — Telephone Encounter (Signed)
Requested Prescriptions  Pending Prescriptions Disp Refills  . amLODipine (NORVASC) 5 MG tablet 30 tablet 1    Sig: Take 1 tablet (5 mg total) by mouth daily.     Cardiovascular:  Calcium Channel Blockers Passed - 09/16/2021 10:54 AM      Passed - Last BP in normal range    BP Readings from Last 1 Encounters:  07/07/21 122/78         Passed - Valid encounter within last 6 months    Recent Outpatient Visits          2 months ago Acute encephalopathy   [place], Vickki Muff, PA-C   4 months ago Pneumonia due to COVID-19 virus   [facility], Retia Passe., MD   1 year ago Tobacco use disorder   [facility] [place], Dionne Bucy, MD   2 years ago Chronic fatigue   [facility] [place], Dionne Bucy, MD             . tadalafil (CIALIS) 20 MG tablet 20 tablet 2    Sig: Take 1 tablet (20 mg total) by mouth every three (3) days as needed for erectile dysfunction.     Urology: Erectile Dysfunction Agents Passed - 09/16/2021 10:54 AM      Passed - Last BP in normal range    BP Readings from Last 1 Encounters:  07/07/21 122/78         Passed - Valid encounter within last 12 months    Recent Outpatient Visits          2 months ago Acute encephalopathy   [place], Vickki Muff, PA-C   4 months ago Pneumonia due to COVID-19 virus   [facility] Jerrol Banana., MD   1 year ago Tobacco use disorder   [facility] [place], Dionne Bucy, MD   2 years ago Chronic fatigue   [facility] [place], Dionne Bucy, MD

## 2021-10-09 ENCOUNTER — Other Ambulatory Visit: Payer: Self-pay | Admitting: Family Medicine

## 2021-10-10 NOTE — Telephone Encounter (Signed)
Patient needs an appointment for future refills.  Please schedule.

## 2021-10-15 NOTE — Telephone Encounter (Signed)
Left message for patient to call back for appt. PEC to schedule

## 2021-12-22 ENCOUNTER — Other Ambulatory Visit: Payer: Self-pay | Admitting: Family Medicine

## 2021-12-22 NOTE — Telephone Encounter (Signed)
Please refill at appt tomorrow if appropriate. Thanks!

## 2021-12-23 ENCOUNTER — Other Ambulatory Visit: Payer: Self-pay

## 2021-12-23 ENCOUNTER — Encounter: Payer: Self-pay | Admitting: Physician Assistant

## 2021-12-23 ENCOUNTER — Ambulatory Visit (INDEPENDENT_AMBULATORY_CARE_PROVIDER_SITE_OTHER): Payer: Medicare Other | Admitting: Physician Assistant

## 2021-12-23 VITALS — BP 120/70 | HR 83 | Temp 98.5°F | Resp 16 | Ht 68.0 in | Wt 133.0 lb

## 2021-12-23 DIAGNOSIS — Z23 Encounter for immunization: Secondary | ICD-10-CM

## 2021-12-23 DIAGNOSIS — K7030 Alcoholic cirrhosis of liver without ascites: Secondary | ICD-10-CM | POA: Diagnosis not present

## 2021-12-23 DIAGNOSIS — R6883 Chills (without fever): Secondary | ICD-10-CM | POA: Diagnosis not present

## 2021-12-23 NOTE — Patient Instructions (Signed)
We will contact you with the results of your lab work once they are available. I have sent in a refill of your amlodipine as requested For now please make sure you are protecting yourself from the cold with appropriate clothing. The applies especially to your hands and feet   It was nice to meet you and I appreciate the opportunity to be involved in your care

## 2021-12-23 NOTE — Progress Notes (Signed)
Established patient visit   Patient: Mark Carr   DOB: Apr 19, 1956   66 y.o. Male  MRN: [id number] Visit Date: 12/23/2021  Today's healthcare provider: Dani Gobble [place] Doster, PA-C  Introduced myself to the patient as a Journalist, newspaper and provided education on APPs in clinical practice.     I,Joseline E Rosas,acting as a scribe for [employer], PA-C.,have documented all relevant documentation on the behalf of [place], PA-C,as directed by  [employer], PA-C while in the presence of Kinnick Maus E Analaya Hoey, PA-C.   Chief Complaint  Patient presents with   Chills   Subjective    HPI  Patient here with concerns of staying cold all the time. States he is chilled in exam room during physical exam.  States chill is relieved with blankets and extra layers but without that he stays cold States it usually starts in the colder months - usually in fall and winter. 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  Unsure if temp is low at home  Denies that extremities are cooler than rest of body- states it is more generalized.    Medications: Outpatient Medications Prior to Visit  Medication Sig   amLODipine (NORVASC) 5 MG tablet Take 1 tablet (5 mg total) by mouth daily. Please schedule office visit before any future refills.   Multiple Vitamin (MULTIVITAMIN WITH MINERALS) TABS tablet Take 1 tablet by mouth daily.   tadalafil (CIALIS) 20 MG tablet Take 1 tablet (20 mg total) by mouth every three (3) days as needed for erectile dysfunction.   thiamine 100 MG tablet Take 1 tablet (100 mg total) by mouth daily.   labetalol (NORMODYNE) 200 MG tablet Take 1 tablet (200 mg total) by mouth 2 (two) times daily.   No facility-administered medications prior to visit.    Review of Systems  Constitutional:  Positive for chills. Negative for fatigue, fever and unexpected weight  change.  Respiratory:  Negative for shortness of breath.   Cardiovascular:  Negative for chest pain and palpitations.  Endocrine: Positive for cold intolerance.  Skin:  Negative for color change, pallor, rash and wound.  Neurological:  Positive for headaches (intermittent). Negative for dizziness, tremors, weakness, light-headedness and numbness.  Psychiatric/Behavioral:         Difficulty with memory       Objective    BP 120/70 (BP Location: Right Arm, Patient Position: Sitting, Cuff Size: Normal)    Pulse 83    Temp 98.5 F (36.9 C) (Oral)    Resp 16    Ht 5\' 8"  (1.727 m)    Wt 133 lb (60.3 kg)    SpO2 99%    BMI 20.22 kg/m  {Show previous vital signs (optional):23777}  Physical Exam Vitals reviewed.  Constitutional:      Appearance: Normal appearance.  HENT:     Head: Normocephalic and atraumatic.     Mouth/Throat:     Mouth: Mucous membranes are moist.     Pharynx: Oropharynx is clear.  Eyes:     Extraocular Movements: Extraocular movements intact.     Conjunctiva/sclera: Conjunctivae normal.     Pupils: Pupils are equal, round, and reactive to light.  Neck:     Thyroid: No thyroid mass, thyromegaly or thyroid tenderness.     Trachea: Trachea normal.  Cardiovascular:     Rate  and Rhythm: Normal rate and regular rhythm.     Pulses: Normal pulses.     Heart sounds: Normal heart sounds.  Pulmonary:     Effort: Pulmonary effort is normal.     Breath sounds: Normal breath sounds. No wheezing, rhonchi or rales.  Musculoskeletal:     Cervical back: Normal range of motion and neck supple.  Skin:    General: Skin is warm and dry.     Capillary Refill: Capillary refill takes less than 2 seconds.     Coloration: Skin is not jaundiced.  Neurological:     Mental Status: He is alert.  Psychiatric:        Mood and Affect: Mood normal.        Behavior: Behavior normal.        Thought Content: Thought content normal.        Judgment: Judgment normal.     Flowsheet Row  Office Visit from 12/23/2021 in [facility]  AUDIT-C Score 10        No results found for any visits on 12/23/21.  Assessment & Plan     Problem List Items Addressed This Visit       Digestive   Alcoholic cirrhosis of liver without ascites ([place])   Relevant Orders   CBC w/Diff/Platelet   Comprehensive Metabolic Panel (CMET)   Vitamin D (25 hydroxy)   Vitamin B1   Other Visit Diagnoses     Chills (without fever)    -  Primary Unsure if chronic vs acute due to limited HPI from patient  Suspect this may be normal body reaction to surroundings/ environment  Lab work to rule out anemia, thyroid concerns Recommend ensuring extremities are protected from cold and check body temp regularly to ensure homeostasis   Relevant Orders   CBC w/Diff/Platelet   Comprehensive Metabolic Panel (CMET)   Vitamin D (25 hydroxy)   Vitamin B1   Need for influenza vaccination       Relevant Orders   Flu Vaccine QUAD High Dose(Fluad) (Completed)        No follow-ups on file.   I, Reyce Lubeck E Kasyn Stouffer, PA-C, have reviewed all documentation for this visit. The documentation on 12/23/21 for the exam, diagnosis, procedures, and orders are all accurate and complete.   Nataly Pacifico, [place] MPH [place], PA-C  [facility] [phone] (phone) [phone] (fax)  [place]

## 2021-12-24 ENCOUNTER — Telehealth: Payer: Self-pay

## 2021-12-24 NOTE — Telephone Encounter (Signed)
Called [facility]. They are going to send a [facility] form for provider to sign.

## 2021-12-24 NOTE — Telephone Encounter (Signed)
-----   Message from Almon Register, PA-C sent at 12/23/2021  5:48 PM EST ----- Please call and add a TSH to lab work.

## 2021-12-26 LAB — CBC WITH DIFFERENTIAL/PLATELET
Basophils Absolute: 0 10*3/uL (ref 0.0–0.2)
Basos: 1 %
EOS (ABSOLUTE): 0.1 10*3/uL (ref 0.0–0.4)
Eos: 2 %
Hematocrit: 39 % (ref 37.5–51.0)
Hemoglobin: 13.6 g/dL (ref 13.0–17.7)
Immature Grans (Abs): 0 10*3/uL (ref 0.0–0.1)
Immature Granulocytes: 1 %
Lymphocytes Absolute: 1.5 10*3/uL (ref 0.7–3.1)
Lymphs: 39 %
MCH: 35.6 pg — ABNORMAL HIGH (ref 26.6–33.0)
MCHC: 34.9 g/dL (ref 31.5–35.7)
MCV: 102 fL — ABNORMAL HIGH (ref 79–97)
Monocytes Absolute: 0.6 10*3/uL (ref 0.1–0.9)
Monocytes: 15 %
Neutrophils Absolute: 1.6 10*3/uL (ref 1.4–7.0)
Neutrophils: 42 %
Platelets: 127 10*3/uL — ABNORMAL LOW (ref 150–450)
RBC: 3.82 x10E6/uL — ABNORMAL LOW (ref 4.14–5.80)
RDW: 12.5 % (ref 11.6–15.4)
WBC: 3.7 10*3/uL (ref 3.4–10.8)

## 2021-12-26 LAB — COMPREHENSIVE METABOLIC PANEL
ALT: 25 IU/L (ref 0–44)
AST: 36 IU/L (ref 0–40)
Albumin/Globulin Ratio: 1.5 (ref 1.2–2.2)
Albumin: 4.4 g/dL (ref 3.8–4.8)
Alkaline Phosphatase: 96 IU/L (ref 44–121)
BUN/Creatinine Ratio: 14 (ref 10–24)
BUN: 10 mg/dL (ref 8–27)
Bilirubin Total: 0.5 mg/dL (ref 0.0–1.2)
CO2: 23 mmol/L (ref 20–29)
Calcium: 9.5 mg/dL (ref 8.6–10.2)
Chloride: 106 mmol/L (ref 96–106)
Creatinine, Ser: 0.71 mg/dL — ABNORMAL LOW (ref 0.76–1.27)
Globulin, Total: 3 g/dL (ref 1.5–4.5)
Glucose: 90 mg/dL (ref 70–99)
Potassium: 4.5 mmol/L (ref 3.5–5.2)
Sodium: 142 mmol/L (ref 134–144)
Total Protein: 7.4 g/dL (ref 6.0–8.5)
eGFR: 102 mL/min/{1.73_m2} (ref >59)

## 2021-12-26 LAB — VITAMIN B1: Thiamine: 210.2 nmol/L — ABNORMAL HIGH (ref 66.5–200.0)

## 2021-12-26 LAB — SPECIMEN STATUS REPORT

## 2021-12-26 LAB — VITAMIN D 25 HYDROXY (VIT D DEFICIENCY, FRACTURES): Vit D, 25-Hydroxy: 43.6 ng/mL (ref 30.0–100.0)

## 2021-12-26 LAB — TSH: TSH: 2.42 u[IU]/mL (ref 0.450–4.500)

## 2022-01-08 NOTE — Progress Notes (Deleted)
?  ? ? ?  Established patient visit ? ? ?Patient: Mark Carr   DOB: 1956/06/13   66 y.o. Male  MRN: [id number] ?Visit Date: 01/09/2022 ? ?Today's healthcare provider: [name], MD  ? ?No chief complaint on file. ? ?Subjective  ?  ?HPI  ?Follow up for chills ? ?The patient was last seen for this 2-3 weeks ago. ?Changes made at last visit include none.ordered labs. ? ?-----------------------------------------------------------------------------------------  ? ?Medications: ?Outpatient Medications Prior to Visit  ?Medication Sig  ? amLODipine (NORVASC) 5 MG tablet TAKE ONE TABLET BY MOUTH DAILY  ? labetalol (NORMODYNE) 200 MG tablet Take 1 tablet (200 mg total) by mouth 2 (two) times daily.  ? Multiple Vitamin (MULTIVITAMIN WITH MINERALS) TABS tablet Take 1 tablet by mouth daily.  ? tadalafil (CIALIS) 20 MG tablet Take 1 tablet (20 mg total) by mouth every three (3) days as needed for erectile dysfunction.  ? thiamine 100 MG tablet Take 1 tablet (100 mg total) by mouth daily.  ? ?No facility-administered medications prior to visit.  ? ? ?Review of Systems ? ?Last CBC ?Lab Results  ?Component Value Date  ? WBC 3.7 12/23/2021  ? HGB 13.6 12/23/2021  ? HCT 39.0 12/23/2021  ? MCV 102 (H) 12/23/2021  ? MCH 35.6 (H) 12/23/2021  ? RDW 12.5 12/23/2021  ? PLT 127 (L) 12/23/2021  ? ?Last metabolic panel ?Lab Results  ?Component Value Date  ? GLUCOSE 90 12/23/2021  ? NA 142 12/23/2021  ? K 4.5 12/23/2021  ? CL 106 12/23/2021  ? CO2 23 12/23/2021  ? BUN 10 12/23/2021  ? CREATININE 0.71 (L) 12/23/2021  ? EGFR 102 12/23/2021  ? CALCIUM 9.5 12/23/2021  ? PHOS 2.5 06/17/2021  ? PROT 7.4 12/23/2021  ? ALBUMIN 4.4 12/23/2021  ? LABGLOB 3.0 12/23/2021  ? AGRATIO 1.5 12/23/2021  ? BILITOT 0.5 12/23/2021  ? ALKPHOS 96 12/23/2021  ? AST 36 12/23/2021  ? ALT 25 12/23/2021  ? ANIONGAP 8 06/20/2021  ? ?Last thyroid functions ?Lab Results  ?Component Value Date  ? TSH 2.420 12/23/2021  ? ?Last vitamin D ?Lab Results  ?Component Value  Date  ? VD25OH 43.6 12/23/2021  ? ?  ?  Objective  ?  ?There were no vitals taken for this visit. ?BP Readings from Last 3 Encounters:  ?12/23/21 120/70  ?07/07/21 122/78  ?06/20/21 134/76  ? ?Wt Readings from Last 3 Encounters:  ?12/23/21 133 lb (60.3 kg)  ?07/07/21 124 lb (56.2 kg)  ?06/19/21 118 lb 6.2 oz (53.7 kg)  ? ?  ? ?Physical Exam  ?*** ? ?No results found for any visits on 01/09/22. ? Assessment & Plan  ?  ? ?*** ? ?No follow-ups on file.  ?   ? ?{provider attestation***:1} ? ? ?[name], MD  ?[facility] ?[phone] (phone) ?[phone] (fax) ? ?[facility]  ?

## 2022-01-09 ENCOUNTER — Other Ambulatory Visit: Payer: Self-pay

## 2022-01-09 ENCOUNTER — Ambulatory Visit: Payer: Medicare Other | Admitting: Family Medicine

## 2022-01-09 DIAGNOSIS — D539 Nutritional anemia, unspecified: Secondary | ICD-10-CM

## 2022-01-10 LAB — B12 AND FOLATE PANEL
Folate: >20 ng/mL (ref >3.0)
Vitamin B-12: 688 pg/mL (ref 232–1245)

## 2022-01-10 LAB — IRON,TIBC AND FERRITIN PANEL
Ferritin: 815 ng/mL — ABNORMAL HIGH (ref 30–400)
Iron Saturation: >94 % (ref 15–55)
Iron: 281 ug/dL (ref 38–169)
Total Iron Binding Capacity: <298 ug/dL (ref 250–450)
UIBC: <17 ug/dL — ABNORMAL LOW (ref 111–343)

## 2022-01-22 ENCOUNTER — Other Ambulatory Visit: Payer: Self-pay | Admitting: Family Medicine

## 2022-01-22 NOTE — Telephone Encounter (Signed)
Copied from [place] [id number]. Topic: Quick Communication - Rx Refill/Question ?>> Jan 22, 2022  3:38 PM Leward Quan A wrote: ?Medication: labetalol (NORMODYNE) 200 MG tablet, amLODipine (NORVASC) 5 MG tablet  ?Patient will run out on 01/24/22 ? ?Has the patient contacted their pharmacy? Yes.  Need new Rx  ?(Agent: If no, request that the patient contact the pharmacy for the refill. If patient does not wish to contact the pharmacy document the reason why and proceed with request.) ?(Agent: If yes, when and what did the pharmacy advise?) ? ?Preferred Pharmacy (with phone number or street name): [facility] [id number], [place]  ?Phone:  [phone] ?Fax:  [phone] ? ? ? ?Has the patient been seen for an appointment in the last year OR does the patient have an upcoming appointment? Yes.   ? ?Agent: Please be advised that RX refills may take up to 3 business days. We ask that you follow-up with your pharmacy. ?

## 2022-01-23 MED ORDER — LABETALOL HCL 200 MG PO TABS: 200.0000 mg | ORAL_TABLET | Freq: Two times a day (BID) | ORAL | 0 refills | Status: DC

## 2022-01-23 MED ORDER — AMLODIPINE BESYLATE 5 MG PO TABS: 5.0000 mg | ORAL_TABLET | Freq: Every day | ORAL | 0 refills | Status: DC

## 2022-01-23 NOTE — Telephone Encounter (Signed)
Requested Prescriptions  ?Pending Prescriptions Disp Refills  ?? labetalol (NORMODYNE) 200 MG tablet 60 tablet 0  ?  Sig: Take 1 tablet (200 mg total) by mouth 2 (two) times daily.  ?  ? Cardiovascular:  Beta Blockers Passed - 01/22/2022  5:27 PM  ?  ?  Passed - Last BP in normal range  ?  BP Readings from Last 1 Encounters:  ?12/23/21 120/70  ?   ?  ?  Passed - Last Heart Rate in normal range  ?  Pulse Readings from Last 1 Encounters:  ?12/23/21 83  ?   ?  ?  Passed - Valid encounter within last 6 months  ?  Recent Outpatient Visits   ?      ? 1 month ago Chills (without fever)  ? [employer], Erin E, PA-C  ? 6 months ago Acute encephalopathy  ? [place], PA-C  ? 8 months ago Pneumonia due to COVID-19 virus  ? [facility] Jerrol Banana., MD  ? 2 years ago Tobacco use disorder  ? [facility], Dionne Bucy, MD  ? 2 years ago Chronic fatigue  ? [facility] Bacigalupo, Dionne Bucy, MD  ?  ?  ?Future Appointments   ?        ? In 6 days Bacigalupo, Dionne Bucy, MD [facility]  ?  ? ?  ?  ?  ?? amLODipine (NORVASC) 5 MG tablet 30 tablet 0  ?  Sig: Take 1 tablet (5 mg total) by mouth daily.  ?  ? Cardiovascular: Calcium Channel Blockers 2 Passed - 01/22/2022  5:27 PM  ?  ?  Passed - Last BP in normal range  ?  BP Readings from Last 1 Encounters:  ?12/23/21 120/70  ?   ?  ?  Passed - Last Heart Rate in normal range  ?  Pulse Readings from Last 1 Encounters:  ?12/23/21 83  ?   ?  ?  Passed - Valid encounter within last 6 months  ?  Recent Outpatient Visits   ?      ? 1 month ago Chills (without fever)  ? [employer], Erin E, PA-C  ? 6 months ago Acute encephalopathy  ? [place], PA-C  ? 8 months ago Pneumonia due to COVID-19 virus  ? [facility] Jerrol Banana., MD  ? 2 years ago Tobacco use disorder  ? [facility] [place], Dionne Bucy, MD  ? 2 years ago Chronic fatigue  ? [facility] Bacigalupo, Dionne Bucy, MD  ?  ?  ?Future Appointments   ?        ? In 6 days Bacigalupo, Dionne Bucy, MD [facility]  ?  ? ?  ?  ?  ? ? ?

## 2022-01-27 NOTE — Progress Notes (Signed)
?  ? ?I,Sulibeya S Dimas,acting as a scribe for [name], MD.,have documented all relevant documentation on the behalf of [name], MD,as directed by  [name], MD while in the presence of [name], MD. ? ? ?Established patient visit ? ? ?Patient: Mark Carr   DOB: 1956-02-21   66 y.o. Male  MRN: [id number] ?Visit Date: 01/29/2022 ? ?Today's healthcare provider: [name], MD  ? ?Chief Complaint  ?Patient presents with  ? Follow-up  ? ?Subjective  ?  ?HPI  ?Follow up for chills ? ?The patient was last seen for this 2 weeks ago. ?Changes made at last visit include: Recommend that he stay well hydrated, decrease alcohol consumption, decrease intake of iron-fortified foods for a few weeks . ? ?He reports excellent compliance with treatment. ?He feels that condition is Improved. ?He is not having side effects.  ? ?----------------------------------------------------------------------------------------- ?Follow up for alcoholic cirrhosis of liver without ascites ? ?The patient was last seen for this 6 months ago. ?Changes made at last visit include continue thiamine daily. ? ?He reports excellent compliance with treatment. ?He feels that condition is Improved. ?He is not having side effects.  ?Patient reports drinking 1 12oz can of beer daily.  ?He has worked on cutting back ? ?Weight loss and chills have resolved ?----------------------------------------------------------------------------------------- ? ? ?Medications: ?Outpatient Medications Prior to Visit  ?Medication Sig  ? amLODipine (NORVASC) 5 MG tablet Take 1 tablet (5 mg total) by mouth daily.  ? labetalol (NORMODYNE) 200 MG tablet Take 1 tablet (200 mg total) by mouth 2 (two) times daily.  ? Multiple Vitamin (MULTIVITAMIN WITH MINERALS) TABS tablet Take 1 tablet by mouth daily.  ? tadalafil (CIALIS) 20 MG tablet Take 1 tablet (20 mg total) by mouth every three (3) days as needed for erectile dysfunction.  ?  thiamine 100 MG tablet Take 1 tablet (100 mg total) by mouth daily.  ? ?No facility-administered medications prior to visit.  ? ? ?Review of Systems  ?Constitutional:  Positive for chills. Negative for appetite change and unexpected weight change.  ?Respiratory:  Negative for chest tightness and shortness of breath.   ?Cardiovascular:  Negative for chest pain and leg swelling.  ? ? ?  Objective  ?  ?BP 132/86 (BP Location: Left Arm, Patient Position: Sitting, Cuff Size: Normal)   Pulse (!) 102   Temp 98.7 ?F (37.1 ?C) (Temporal)   Resp (!) 169   Wt 129 lb (58.5 kg)   BMI 19.61 kg/m?  ? ? ?Physical Exam ?Vitals reviewed.  ?Constitutional:   ?   General: He is not in acute distress. ?   Appearance: Normal appearance. He is not diaphoretic.  ?HENT:  ?   Head: Normocephalic and atraumatic.  ?Eyes:  ?   General: No scleral icterus. ?   Conjunctiva/sclera: Conjunctivae normal.  ?Cardiovascular:  ?   Rate and Rhythm: Normal rate and regular rhythm.  ?   Pulses: Normal pulses.  ?   Heart sounds: Normal heart sounds. No murmur heard. ?Pulmonary:  ?   Effort: Pulmonary effort is normal. No respiratory distress.  ?   Breath sounds: Normal breath sounds. No wheezing or rhonchi.  ?Abdominal:  ?   General: There is no distension.  ?   Palpations: Abdomen is soft.  ?   Tenderness: There is no abdominal tenderness.  ?Musculoskeletal:  ?   Cervical back: Neck supple.  ?   Right lower leg: No edema.  ?   Left lower leg: No edema.  ?  Lymphadenopathy:  ?   Cervical: No cervical adenopathy.  ?Skin: ?   General: Skin is warm and dry.  ?   Capillary Refill: Capillary refill takes less than 2 seconds.  ?Neurological:  ?   Mental Status: He is alert and oriented to person, place, and time.  ?Psychiatric:     ?   Mood and Affect: Mood normal.     ?   Behavior: Behavior normal.  ?  ? ? ?No results found for any visits on 01/29/22. ? Assessment & Plan  ?  ? ?Problem List Items Addressed This Visit   ? ?  ? Cardiovascular and Mediastinum  ?  Hypertension  ?  Well controlled ?Continue current medications ?Reviewed recent metabolic panel ?  ?  ?  ? Digestive  ? Alcoholic cirrhosis of liver without ascites ([place]) - Primary  ?  Chronic and stable ?No ascites ?Cutting back on alcohol - which I encouraged ?  ?  ?  ? Hematopoietic and Hemostatic  ? Thrombocytopenia ([place])  ?  Chronic and stable on recent labs ?2/2 alcohol use and cirrhosis ?No bleeding ?Continue to monitor q69mCBC ?  ?  ?  ? Other  ? Tobacco use disorder  ?  Patient is currently precontemplative  ?Will reassess at next visit  ?  ?  ? Macrocytosis  ?  Discussed with him that he does not have anemia, just macrocytosis 2/2 alcohol use ?Continue to monitor ?  ?  ? Protein-calorie malnutrition, severe  ?  Weight loss has stabilized ?Encouraged protein rich meals and less alcohol intake ?  ?  ? Alcohol use disorder, moderate, dependence ([facility]  ?  Working on cutting back ?Now drinking 1 12 oz beer daily only ?  ?  ?  ? ?Return in about 6 months (around 08/01/2022) for AWV, chronic disease f/u.  ?   ? ?I, ALavon Paganini MD, have reviewed all documentation for this visit. The documentation on 01/29/22 for the exam, diagnosis, procedures, and orders are all accurate and complete. ? ? ?BVirginia Crews MD, MPH ?[place]?[facility]   ?

## 2022-01-29 ENCOUNTER — Ambulatory Visit (INDEPENDENT_AMBULATORY_CARE_PROVIDER_SITE_OTHER): Payer: Medicare Other | Admitting: Family Medicine

## 2022-01-29 ENCOUNTER — Encounter: Payer: Self-pay | Admitting: Family Medicine

## 2022-01-29 ENCOUNTER — Other Ambulatory Visit: Payer: Self-pay

## 2022-01-29 VITALS — BP 132/86 | HR 102 | Temp 98.7°F | Resp 169 | Wt 129.0 lb

## 2022-01-29 DIAGNOSIS — K7030 Alcoholic cirrhosis of liver without ascites: Secondary | ICD-10-CM | POA: Diagnosis not present

## 2022-01-29 DIAGNOSIS — I10 Essential (primary) hypertension: Secondary | ICD-10-CM

## 2022-01-29 DIAGNOSIS — E43 Unspecified severe protein-calorie malnutrition: Secondary | ICD-10-CM

## 2022-01-29 DIAGNOSIS — F17200 Nicotine dependence, unspecified, uncomplicated: Secondary | ICD-10-CM

## 2022-01-29 DIAGNOSIS — D7589 Other specified diseases of blood and blood-forming organs: Secondary | ICD-10-CM

## 2022-01-29 DIAGNOSIS — F1020 Alcohol dependence, uncomplicated: Secondary | ICD-10-CM

## 2022-01-29 DIAGNOSIS — D696 Thrombocytopenia, unspecified: Secondary | ICD-10-CM

## 2022-01-29 NOTE — Assessment & Plan Note (Signed)
Chronic and stable ?No ascites ?Cutting back on alcohol - which I encouraged ?

## 2022-01-29 NOTE — Assessment & Plan Note (Signed)
Chronic and stable on recent labs ?2/2 alcohol use and cirrhosis ?No bleeding ?Continue to monitor q23mCBC ?

## 2022-01-29 NOTE — Assessment & Plan Note (Signed)
Patient is currently precontemplative  ?Will reassess at next visit  ?

## 2022-01-29 NOTE — Assessment & Plan Note (Signed)
Well controlled Continue current medications Reviewed recent metabolic panel 

## 2022-01-29 NOTE — Assessment & Plan Note (Signed)
Working on cutting back ?Now drinking 1 12 oz beer daily only ?

## 2022-01-29 NOTE — Assessment & Plan Note (Signed)
Weight loss has stabilized ?Encouraged protein rich meals and less alcohol intake ?

## 2022-01-29 NOTE — Assessment & Plan Note (Signed)
Discussed with him that he does not have anemia, just macrocytosis 2/2 alcohol use ?Continue to monitor ?

## 2022-02-18 ENCOUNTER — Other Ambulatory Visit: Payer: Self-pay | Admitting: Family Medicine

## 2022-05-20 ENCOUNTER — Emergency Department
Admission: EM | Admit: 2022-05-20 | Discharge: 2022-05-20 | Discharge status: Against Medical Advice | Payer: Medicare Other

## 2022-05-20 NOTE — ED Notes (Signed)
No answer when called several times from lobby 

## 2022-05-20 NOTE — ED Triage Notes (Signed)
First nurse note: Patient arrived by [facility] from home with c/o cough that started today. Reports two hours ago cough changed to continuous. EMS vitals 99%RA, blood pressure 122/78, 130HR, RR20, 117 cbg, 97.8 axillary

## 2022-06-01 ENCOUNTER — Encounter: Payer: Self-pay | Admitting: Family Medicine

## 2022-06-01 ENCOUNTER — Ambulatory Visit (INDEPENDENT_AMBULATORY_CARE_PROVIDER_SITE_OTHER): Payer: Medicare Other | Admitting: Family Medicine

## 2022-06-01 VITALS — BP 137/89 | HR 90 | Temp 98.7°F | Ht 68.0 in | Wt 124.8 lb

## 2022-06-01 DIAGNOSIS — D696 Thrombocytopenia, unspecified: Secondary | ICD-10-CM

## 2022-06-01 DIAGNOSIS — I10 Essential (primary) hypertension: Secondary | ICD-10-CM | POA: Diagnosis not present

## 2022-06-01 DIAGNOSIS — Z1211 Encounter for screening for malignant neoplasm of colon: Secondary | ICD-10-CM | POA: Diagnosis not present

## 2022-06-01 DIAGNOSIS — K7030 Alcoholic cirrhosis of liver without ascites: Secondary | ICD-10-CM | POA: Diagnosis not present

## 2022-06-01 DIAGNOSIS — D7589 Other specified diseases of blood and blood-forming organs: Secondary | ICD-10-CM

## 2022-06-01 DIAGNOSIS — E43 Unspecified severe protein-calorie malnutrition: Secondary | ICD-10-CM

## 2022-06-01 DIAGNOSIS — F1020 Alcohol dependence, uncomplicated: Secondary | ICD-10-CM

## 2022-06-01 MED ORDER — THIAMINE HCL 100 MG PO TABS: 100.0000 mg | ORAL_TABLET | Freq: Every day | ORAL | 1 refills | Status: DC

## 2022-06-01 MED ORDER — FAMOTIDINE 20 MG PO TABS: 20.0000 mg | ORAL_TABLET | Freq: Every day | ORAL | 3 refills | Status: DC

## 2022-06-01 NOTE — Assessment & Plan Note (Addendum)
Chronic, worsened. Back up to 5-6 12oz beers daily. Discussed AA, counseling but patient declines. Work on Careers adviser.

## 2022-06-01 NOTE — Progress Notes (Signed)
    I,Mark Carr,acting as a Education administrator for Mark Gip, DO.,have documented all relevant documentation on the behalf of Mark Gip, DO,as directed by  Mark Gip, DO while in the presence of Mark Gip, DO.   SUBJECTIVE:   CHIEF COMPLAINT / HPI:   Hypertension: - Medications: amlodipine, labetalol - Compliance: taking as prescribed. - Checking BP at home: yes, 120-130s SBP - Denies any SOB, vision changes, LE edema, medication SEs, or symptoms of hypotension - Diet: general - Exercise: walking 15-20 minutes daily  Abdominal pains - upper abdominal, lower chest pains, nagging.  - Mainly after eating, occasionally after taking labetalol.  - associated with some nausea, no vomiting.  - denies SOB, sweating.  - does not occur with exertion.  - alleviated sometimes by pepto bismol.   Alcoholic cirrhosis - on thiamine daily - drinking 5-6 12 oz beers. Has increased recently, states because "he can." Denies recent stressors.  - has had some withdrawals with shaking. No prior seizures or hospitalization.   OBJECTIVE:   BP 137/89 (BP Location: Right Arm, Patient Position: Sitting, Cuff Size: Normal)   Pulse 90   Temp 98.7 F (37.1 C) (Oral)   Ht '5\' 8"'$  (1.727 m)   Wt 124 lb 12.8 oz (56.6 kg)   SpO2 100%   BMI 18.98 kg/m   Gen: well appearing, in NAD Card: RRR Lungs: CTAB Ext: WWP, no edema   ASSESSMENT/PLAN:   Hypertension Doing well on current regimen, no changes made today.  Alcoholic cirrhosis of liver without ascites (H[facility]) Euvolemic, without ascites. Encouraged weaning with ultimate goal of cessation. Discussed counseling, AA but patient declines. Expresses desire to quit. Obtaining labs today.  Alcohol use disorder, moderate, dependence ([facility]) Chronic, worsened. Back up to 5-6 12oz beers daily. Discussed AA, counseling but patient declines. Work on Careers adviser.    Acid reflux Abdominal discomfort most consistent with reflux symptoms, trial  pepcid. Recommend weaning alcohol, tobacco use.   Mark Gip, DO

## 2022-06-01 NOTE — Assessment & Plan Note (Signed)
Euvolemic, without ascites. Encouraged weaning with ultimate goal of cessation. Discussed counseling, AA but patient declines. Expresses desire to quit. Obtaining labs today.

## 2022-06-01 NOTE — Assessment & Plan Note (Signed)
Doing well on current regimen, no changes made today. 

## 2022-06-02 ENCOUNTER — Other Ambulatory Visit: Payer: Self-pay

## 2022-06-02 ENCOUNTER — Telehealth: Payer: Self-pay

## 2022-06-02 DIAGNOSIS — Z1211 Encounter for screening for malignant neoplasm of colon: Secondary | ICD-10-CM

## 2022-06-02 MED ORDER — CLENPIQ 10-3.5-12 MG-GM -GM/175ML PO SOLN: 175.0000 mg | Freq: Once | ORAL | 0 refills | Status: AC

## 2022-06-02 NOTE — Telephone Encounter (Signed)
Gastroenterology Pre-Procedure Review  Request Date: 07/14/2022 Requesting Physician: Dr. Marius Ditch   PATIENT REVIEW QUESTIONS: The patient responded to the following health history questions as indicated:    1. Are you having any GI issues? no 2. Do you have a personal history of Polyps? no 3. Do you have a family history of Colon Cancer or Polyps? no 4. Diabetes Mellitus? no 5. Joint replacements in the past 12 months?no 6. Major health problems in the past 3 months?no 7. Any artificial heart valves, MVP, or defibrillator?no    MEDICATIONS & ALLERGIES:    Patient reports the following regarding taking any anticoagulation/antiplatelet therapy:   Plavix, Coumadin, Eliquis, Xarelto, Lovenox, Pradaxa, Brilinta, or Effient? no Aspirin? no  Patient confirms/reports the following medications:  Current Outpatient Medications  Medication Sig Dispense Refill   amLODipine (NORVASC) 5 MG tablet TAKE ONE TABLET BY MOUTH DAILY 90 tablet 1   famotidine (PEPCID) 20 MG tablet Take 1 tablet (20 mg total) by mouth daily. 90 tablet 3   labetalol (NORMODYNE) 200 MG tablet TAKE ONE TABLET BY MOUTH TWICE A DAY 180 tablet 2   Multiple Vitamin (MULTIVITAMIN WITH MINERALS) TABS tablet Take 1 tablet by mouth daily. 30 tablet 1   tadalafil (CIALIS) 20 MG tablet Take 1 tablet (20 mg total) by mouth every three (3) days as needed for erectile dysfunction. 20 tablet 2   thiamine 100 MG tablet Take 1 tablet (100 mg total) by mouth daily. 30 tablet 1   No current facility-administered medications for this visit.    Patient confirms/reports the following allergies:  No Known Allergies  No orders of the defined types were placed in this encounter.   AUTHORIZATION INFORMATION Primary Insurance: 1D#: Group #:  Secondary Insurance: 1D#: Group #:  SCHEDULE INFORMATION: Date:  Time: Location:

## 2022-06-05 LAB — CBC WITH DIFFERENTIAL/PLATELET
Basophils Absolute: 0 10*3/uL (ref 0.0–0.2)
Basos: 1 %
EOS (ABSOLUTE): 0.1 10*3/uL (ref 0.0–0.4)
Eos: 2 %
Hematocrit: 35.8 % — ABNORMAL LOW (ref 37.5–51.0)
Hemoglobin: 12.4 g/dL — ABNORMAL LOW (ref 13.0–17.7)
Immature Grans (Abs): 0 10*3/uL (ref 0.0–0.1)
Immature Granulocytes: 0 %
Lymphocytes Absolute: 1 10*3/uL (ref 0.7–3.1)
Lymphs: 32 %
MCH: 36.4 pg — ABNORMAL HIGH (ref 26.6–33.0)
MCHC: 34.6 g/dL (ref 31.5–35.7)
MCV: 105 fL — ABNORMAL HIGH (ref 79–97)
Monocytes Absolute: 0.5 10*3/uL (ref 0.1–0.9)
Monocytes: 17 %
Neutrophils Absolute: 1.5 10*3/uL (ref 1.4–7.0)
Neutrophils: 48 %
Platelets: 79 10*3/uL — CL (ref 150–450)
RBC: 3.41 x10E6/uL — ABNORMAL LOW (ref 4.14–5.80)
RDW: 12.9 % (ref 11.6–15.4)
WBC: 3.1 10*3/uL — ABNORMAL LOW (ref 3.4–10.8)

## 2022-06-05 LAB — COMPREHENSIVE METABOLIC PANEL
ALT: 54 IU/L — ABNORMAL HIGH (ref 0–44)
AST: 118 IU/L — ABNORMAL HIGH (ref 0–40)
Albumin/Globulin Ratio: 1.5 (ref 1.2–2.2)
Albumin: 4.4 g/dL (ref 3.9–4.9)
Alkaline Phosphatase: 126 IU/L — ABNORMAL HIGH (ref 44–121)
BUN/Creatinine Ratio: 8 — ABNORMAL LOW (ref 10–24)
BUN: 5 mg/dL — ABNORMAL LOW (ref 8–27)
Bilirubin Total: 1.1 mg/dL (ref 0.0–1.2)
CO2: 22 mmol/L (ref 20–29)
Calcium: 9.2 mg/dL (ref 8.6–10.2)
Chloride: 101 mmol/L (ref 96–106)
Creatinine, Ser: 0.64 mg/dL — ABNORMAL LOW (ref 0.76–1.27)
Globulin, Total: 3 g/dL (ref 1.5–4.5)
Glucose: 96 mg/dL (ref 70–99)
Potassium: 3.9 mmol/L (ref 3.5–5.2)
Sodium: 139 mmol/L (ref 134–144)
Total Protein: 7.4 g/dL (ref 6.0–8.5)
eGFR: 105 mL/min/{1.73_m2} (ref >59)

## 2022-06-05 LAB — HEPATITIS C ANTIBODY: Hep C Virus Ab: NONREACTIVE

## 2022-06-05 LAB — VITAMIN B1: Thiamine: 84.8 nmol/L (ref 66.5–200.0)

## 2022-06-16 ENCOUNTER — Telehealth: Payer: Self-pay

## 2022-06-16 NOTE — Telephone Encounter (Signed)
Patient was sent colonoscopy instructions in the mail and the mail was returned. Resent to Temporary address

## 2022-07-14 ENCOUNTER — Ambulatory Visit: Admission: RE | Admit: 2022-07-14 | Payer: Medicare Other | Source: Ambulatory Visit | Admitting: Gastroenterology

## 2022-07-14 ENCOUNTER — Encounter: Admission: RE | Payer: Self-pay | Source: Ambulatory Visit

## 2022-07-14 SURGERY — COLONOSCOPY WITH PROPOFOL
Anesthesia: General

## 2022-07-14 NOTE — Progress Notes (Unsigned)
Established patient visit  I,Joseline E Rosas,acting as a scribe for [employer], MD.,have documented all relevant documentation on the behalf of Eulis Foster, MD,as directed by  Eulis Foster, MD while in the presence of Eulis Foster, MD.   Patient: Mark Carr   DOB: Mar 13, 1956   66 y.o. Male  MRN: [id number] Visit Date: 07/15/2022  Today's healthcare provider: Eulis Foster, MD   Chief Complaint  Patient presents with  . Fall   Subjective    Epigastric Pain  Patient previously treated for acid reflux  Reports that he has not taken the medication  Reports pain occurs in both upper quadrants of abdomen  Pain occurs while at rest, it is not constant He denies that the pain is severe  Denies having SOB related to pain  Reports that pain last for ~3-4 min  Denies feeling nauseous  Reports that he has some loose stools twice per week    Fall/ Weakness in legs  Daughter and patient's wife state that he has been having problems with balance the last two weeks  They feel that he has been weak in his legs  He does not use assistive device at home  They reports that he has fallen four times in the last week (they add alcohol consumption has increased)  Alcohol use  Patient reports he has been drinking more since last visit in July  He estimates drinking 9-10 beers per day compared to 5-6 beers in July  His family adds that he had a case of beer yesterday and often walks to the store to obtain alcohol independently  Patient and family reports that he has not been eating well  He reports that he does not have an appetite and is losing weight  Denies resources such as CCM or AA to help with disease management   HTN  Patient reports irregular adherence to medication  Does not take meds if he has not eaten as this makes his stomach hurt    Fall The accident occurred More than 1 week ago (Last week). Fall occurred:  tripped while fishing. He fell from a height of 3 to 5 ft. He landed on [place]. There was no blood loss. The point of impact was the right shoulder. The pain is present in the right shoulder. The pain is mild (Moderate to Severe). The symptoms are aggravated by pressure on injury. Associated symptoms include abdominal pain. Pertinent negatives include no fever, nausea, numbness, tingling or vomiting. Associated symptoms comments: Weakness on legs Reports some chest pain off and on since last office visit-GERD. He has tried acetaminophen (voltaren Gel) for the symptoms. The treatment provided no relief.    Reports having cough episodes that makes him feel very tired and some sob. This has been going on for the past month.  Medications: Outpatient Medications Prior to Visit  Medication Sig  . amLODipine (NORVASC) 5 MG tablet TAKE ONE TABLET BY MOUTH DAILY  . famotidine (PEPCID) 20 MG tablet Take 1 tablet (20 mg total) by mouth daily.  Marland Kitchen labetalol (NORMODYNE) 200 MG tablet TAKE ONE TABLET BY MOUTH TWICE A DAY  . Multiple Vitamin (MULTIVITAMIN WITH MINERALS) TABS tablet Take 1 tablet by mouth daily.  . tadalafil (CIALIS) 20 MG tablet Take 1 tablet (20 mg total) by mouth every three (3) days as needed for erectile dysfunction.  . thiamine 100 MG tablet Take 1 tablet (100 mg total) by mouth daily.   No facility-administered medications prior to  visit.    Review of Systems  Constitutional:  Positive for appetite change and unexpected weight change. Negative for fever.  Respiratory:  Negative for choking and chest tightness.   Gastrointestinal:  Positive for abdominal pain. Negative for nausea and vomiting.  Neurological:  Negative for tingling and numbness.       Unsteady gait  Frequent falls       Objective    BP 119/85 (BP Location: Left Arm, Patient Position: Sitting, Cuff Size: Normal)   Pulse (!) 105   Temp 98.3 F (36.8 C) (Oral)   Resp 16   Wt 121 lb 4.8 oz (55 kg)   BMI 18.44 kg/m    Physical Exam Constitutional:      General: He is not in acute distress.    Appearance: He is not ill-appearing.     Comments: Cachetic appearing male   Cardiovascular:     Rate and Rhythm: Normal rate and regular rhythm.     Pulses: Normal pulses.  Pulmonary:     Effort: Pulmonary effort is normal. No respiratory distress.     Breath sounds: Normal breath sounds. No wheezing, rhonchi or rales.  Abdominal:     General: Bowel sounds are normal.     Palpations: Abdomen is soft.     Tenderness: There is no abdominal tenderness.  Musculoskeletal:        General: Tenderness present. Normal range of motion.     Comments: Tenderness on right deltoid Normal ROM of shoulder   Neurological:     Mental Status: He is alert and oriented to person, place, and time.     Motor: Weakness present.     Gait: Gait normal.     Comments: Mildly tremulous     Results for orders placed or performed in visit on 07/15/22  POCT urinalysis dipstick  Result Value Ref Range   Color, UA Dark Yellow    Clarity, UA Clear    Glucose, UA Negative Negative   Bilirubin, UA Negative    Ketones, UA Positive    Spec Grav, UA 1.015 1.010 - 1.025   Blood, UA Negative    pH, UA 6.0 5.0 - 8.0   Protein, UA Negative Negative   Urobilinogen, UA 0.2 0.2 or 1.0 E.U./dL   Nitrite, UA Negative    Leukocytes, UA Negative Negative   Appearance     Odor      Assessment & Plan     Problem List Items Addressed This Visit       Cardiovascular and Mediastinum   Hypertension    Chronic, controlled  Advised regular adherence to medication regimen including amlodipine '5mg'$  daily and labetalol '200mg'$  twice daily  CMP collected today  U/A collected to assess for proteinuria      Relevant Orders   Comprehensive metabolic panel   POCT urinalysis dipstick (Completed)     Other   Protein-calorie malnutrition, severe    Chronic, worsening  Patient has lost three pounds since last visit 121lbs from 124lbs in July  2023 Patient reports having supplemental drinks at home  Recommended drinking three per day after meals  CMP, CBC, vitamin  B1 and B12, U/A  F/u in 2 weeks       Relevant Orders   Comprehensive metabolic panel   POCT urinalysis dipstick (Completed)   Alcohol use disorder, moderate, dependence (HCC) - Primary    Chronic, worsening  Patient continues to drink increasing amounts as reported by him and family members  He denies  stressors Discussed resources such as AA, patient declines  Chart review shows attempts to establish with chronic care management have been unsuccessful  Patient is agreeable to decreasing alcohol intake  Recommended continuing thiamin '100mg'$  daily  Prescribed naltrexone '50mg'$  daily  Will obtain thiamine and B12 levels today in setting of increased alcohol consumption and weight loss  F/u in 2 weeks      Relevant Orders   CBC   Vitamin B1   Vitamin B12   Frequent falls    Chronic, worsening  This is most likely related to chronic alcohol use  Neurological exam noted for subtle tremor,5/5 strength in bilateral lower extremities,mildly unsteady with pronator drift testing  Vitamin B12 and Thiamine levels collected  Recommended decreased alcohol consumption and counseled patient on worsening neurological function and increased risk of falls with continued alcohol use      Relevant Orders   Vitamin B1   Vitamin B12   Generally unsteady   Relevant Orders   Comprehensive metabolic panel     Return in about 2 weeks (around 07/29/2022) for alcohol use f/u.      The entirety of the information documented in the History of Present Illness, Review of Systems and Physical Exam were personally obtained by me, Selam Pietsch Simmons-Robinson. Portions of this information were initially documented by the CMA and reviewed by me for thoroughness and accuracy.     Eulis Foster, MD  [facility] [phone] (phone) [phone] (fax)  [place]

## 2022-07-15 ENCOUNTER — Encounter: Payer: Self-pay | Admitting: Family Medicine

## 2022-07-15 ENCOUNTER — Ambulatory Visit (INDEPENDENT_AMBULATORY_CARE_PROVIDER_SITE_OTHER): Payer: Medicare Other | Admitting: Family Medicine

## 2022-07-15 VITALS — BP 119/85 | HR 105 | Temp 98.3°F | Resp 16 | Wt 121.3 lb

## 2022-07-15 DIAGNOSIS — R296 Repeated falls: Secondary | ICD-10-CM

## 2022-07-15 DIAGNOSIS — R2681 Unsteadiness on feet: Secondary | ICD-10-CM | POA: Diagnosis not present

## 2022-07-15 DIAGNOSIS — I10 Essential (primary) hypertension: Secondary | ICD-10-CM

## 2022-07-15 DIAGNOSIS — F1020 Alcohol dependence, uncomplicated: Secondary | ICD-10-CM

## 2022-07-15 DIAGNOSIS — E43 Unspecified severe protein-calorie malnutrition: Secondary | ICD-10-CM

## 2022-07-15 LAB — POCT URINALYSIS DIPSTICK
Bilirubin, UA: NEGATIVE
Blood, UA: NEGATIVE
Glucose, UA: NEGATIVE
Ketones, UA: POSITIVE
Leukocytes, UA: NEGATIVE
Nitrite, UA: NEGATIVE
Protein, UA: NEGATIVE
Spec Grav, UA: 1.015 (ref 1.010–1.025)
Urobilinogen, UA: 0.2 E.U./dL
pH, UA: 6 (ref 5.0–8.0)

## 2022-07-15 MED ORDER — NALTREXONE HCL 50 MG PO TABS: 50.0000 mg | ORAL_TABLET | Freq: Every day | ORAL | 0 refills | Status: DC

## 2022-07-15 NOTE — Patient Instructions (Signed)
It was a pleasure to see you today!  Thank you for choosing [facility] for your primary care.   Mark Carr was seen for falls.   Our plans for today were: We will collect blood work to assess liver function and electrolytes as well as blood and vitamin levels  Your urine showed dehydration  It will be very important to cut down on drinking as this will be damaging to your health and puts you at increased risk for falls .  I have prescribed a medication to help with cutting down alcohol cravings.    You should return to our clinic in 2 weeks  Best Wishes,   Dr. Quentin Cornwall

## 2022-07-15 NOTE — Assessment & Plan Note (Signed)
Chronic, worsening  Patient has lost three pounds since last visit 121lbs from 124lbs in July 2023 Patient reports having supplemental drinks at home  Recommended drinking three per day after meals  CMP, CBC, vitamin  B1 and B12, U/A  F/u in 2 weeks

## 2022-07-15 NOTE — Assessment & Plan Note (Signed)
Chronic, worsening  Patient continues to drink increasing amounts as reported by him and family members  He denies stressors Discussed resources such as AA, patient declines  Chart review shows attempts to establish with chronic care management have been unsuccessful  Patient is agreeable to decreasing alcohol intake  Recommended continuing thiamin '100mg'$  daily  Prescribed naltrexone '50mg'$  daily  Will obtain thiamine and B12 levels today in setting of increased alcohol consumption and weight loss  F/u in 2 weeks

## 2022-07-15 NOTE — Assessment & Plan Note (Signed)
Chronic, controlled  Advised regular adherence to medication regimen including amlodipine '5mg'$  daily and labetalol '200mg'$  twice daily  CMP collected today  U/A collected to assess for proteinuria

## 2022-07-15 NOTE — Assessment & Plan Note (Signed)
Chronic, worsening  This is most likely related to chronic alcohol use  Neurological exam noted for subtle tremor,5/5 strength in bilateral lower extremities,mildly unsteady with pronator drift testing  Vitamin B12 and Thiamine levels collected  Recommended decreased alcohol consumption and counseled patient on worsening neurological function and increased risk of falls with continued alcohol use

## 2022-07-17 ENCOUNTER — Emergency Department
Admission: EM | Admit: 2022-07-17 | Discharge: 2022-07-17 | Disposition: A | Discharge status: Home / Self Care | Payer: Medicare Other | Attending: Emergency Medicine | Admitting: Emergency Medicine

## 2022-07-17 ENCOUNTER — Emergency Department: Payer: Medicare Other

## 2022-07-17 ENCOUNTER — Other Ambulatory Visit: Admission: RE | Admit: 2022-07-17 | Payer: Medicare Other | Source: Home / Self Care | Admitting: *Deleted

## 2022-07-17 ENCOUNTER — Other Ambulatory Visit: Payer: Self-pay

## 2022-07-17 DIAGNOSIS — D696 Thrombocytopenia, unspecified: Secondary | ICD-10-CM | POA: Diagnosis present

## 2022-07-17 DIAGNOSIS — I6783 Posterior reversible encephalopathy syndrome: Secondary | ICD-10-CM | POA: Insufficient documentation

## 2022-07-17 DIAGNOSIS — R748 Abnormal levels of other serum enzymes: Secondary | ICD-10-CM | POA: Insufficient documentation

## 2022-07-17 DIAGNOSIS — G319 Degenerative disease of nervous system, unspecified: Secondary | ICD-10-CM | POA: Diagnosis not present

## 2022-07-17 LAB — CBC WITH DIFFERENTIAL/PLATELET
Abs Immature Granulocytes: 0.01 10*3/uL (ref 0.00–0.07)
Basophils Absolute: 0 10*3/uL (ref 0.0–0.1)
Basophils Relative: 1 %
Eosinophils Absolute: 0 10*3/uL (ref 0.0–0.5)
Eosinophils Relative: 1 %
HCT: 36.5 % — ABNORMAL LOW (ref 39.0–52.0)
Hemoglobin: 12.8 g/dL — ABNORMAL LOW (ref 13.0–17.0)
Immature Granulocytes: 0 %
Lymphocytes Relative: 27 %
Lymphs Abs: 0.8 10*3/uL (ref 0.7–4.0)
MCH: 36.6 pg — ABNORMAL HIGH (ref 26.0–34.0)
MCHC: 35.1 g/dL (ref 30.0–36.0)
MCV: 104.3 fL — ABNORMAL HIGH (ref 80.0–100.0)
Monocytes Absolute: 0.3 10*3/uL (ref 0.1–1.0)
Monocytes Relative: 11 %
Neutro Abs: 1.8 10*3/uL (ref 1.7–7.7)
Neutrophils Relative %: 60 %
Platelets: 54 10*3/uL — ABNORMAL LOW (ref 150–400)
RBC: 3.5 MIL/uL — ABNORMAL LOW (ref 4.22–5.81)
RDW: 12.7 % (ref 11.5–15.5)
Smear Review: DECREASED
WBC: 2.9 10*3/uL — ABNORMAL LOW (ref 4.0–10.5)
nRBC: 0 % (ref 0.0–0.2)

## 2022-07-17 LAB — TYPE AND SCREEN
ABO/RH(D): O POS
Antibody Screen: NEGATIVE

## 2022-07-17 LAB — COMPREHENSIVE METABOLIC PANEL
ALT: 64 U/L — ABNORMAL HIGH (ref 0–44)
AST: 197 U/L — ABNORMAL HIGH (ref 15–41)
Albumin: 4.2 g/dL (ref 3.5–5.0)
Alkaline Phosphatase: 112 U/L (ref 38–126)
Anion gap: 12 (ref 5–15)
BUN: 5 mg/dL — ABNORMAL LOW (ref 8–23)
CO2: 21 mmol/L — ABNORMAL LOW (ref 22–32)
Calcium: 9.4 mg/dL (ref 8.9–10.3)
Chloride: 102 mmol/L (ref 98–111)
Creatinine, Ser: 0.62 mg/dL (ref 0.61–1.24)
GFR, Estimated: >60 mL/min (ref >60)
Glucose, Bld: 97 mg/dL (ref 70–99)
Potassium: 4 mmol/L (ref 3.5–5.1)
Sodium: 135 mmol/L (ref 135–145)
Total Bilirubin: 2.1 mg/dL — ABNORMAL HIGH (ref 0.3–1.2)
Total Protein: 7.8 g/dL (ref 6.5–8.1)

## 2022-07-17 LAB — PROTIME-INR
INR: 1.3 — ABNORMAL HIGH (ref 0.8–1.2)
Prothrombin Time: 15.7 seconds — ABNORMAL HIGH (ref 11.4–15.2)

## 2022-07-17 NOTE — ED Triage Notes (Addendum)
Pt sent from PCP for recheck of platelet count. Per family, MD called and platelets were low and may need platelet transfusion.

## 2022-07-17 NOTE — Discharge Instructions (Signed)
   Thank you for choosing us for your health care today!  Please see your primary doctor this week for a follow up appointment.   If you do not have a primary doctor call the following clinics to establish care:  If you have insurance:  [facility] [phone] [address] [place] [address]   [facility]  [phone] [address] [place] [address]   If you do not have insurance:  [facility]  [phone] [address] [place] [address]  Sometimes, in the early stages of certain disease courses it is difficult to detect in the emergency department evaluation -- so, it is important that you continue to monitor your symptoms and call your doctor right away or return to the emergency department if you develop any new or worsening symptoms.  It was my pleasure to care for you today.   Dwayne Begay S. Nuvia Hileman, MD  

## 2022-07-17 NOTE — ED Provider Notes (Addendum)
[facility] Provider Note    Event Date/Time   First MD Initiated Contact with Patient 07/17/22 1050     (approximate)   History   Abnormal Lab   HPI  Mark Carr is a 66 y.o. male   Past medical history of alcohol use, cirrhosis, who presents with low platelet count on outpatient testing.  He has chronic thrombocytopenia but testing today was unable to detect platelets due to clumping.  He was sent into the emergency department for further evaluation.   No bleeding reported by patient. Gait has been chronically unstable and he is on thiamine and folate, he had a fall several weeks ago with no head strike or loss of consciousness.  No other medical complaints from patient, no acute complaints.   History was obtained via the patient and a family member at bedside      Physical Exam   Triage Vital Signs: ED Triage Vitals  Enc Vitals Group     BP 07/17/22 1040 137/83     Pulse Rate 07/17/22 1040 98     Resp 07/17/22 1040 15     Temp 07/17/22 1040 98 F (36.7 C)     Temp Source 07/17/22 1040 Oral     SpO2 07/17/22 1040 97 %     Weight --      Height --      Head Circumference --      Peak Flow --      Pain Score 07/17/22 1041 0     Pain Loc --      Pain Edu? --      Excl. in [place]? --     Most recent vital signs: Vitals:   07/17/22 1136 07/17/22 1200  BP: 128/83 126/84  Pulse: 82 84  Resp: 17   Temp:    SpO2: 98% 97%    General: Awake, no distress.  CV:  Good peripheral perfusion.  Resp:  Normal effort.  Abd:  No distention.Melchor Amour  Other:  Overall well-appearing, no focal neurological deficits, extraocular movements intact, motor and sensory intact, wide-based unsteady gait, which is baseline per patient and family member at bedside.   ED Results / Procedures / Treatments   Labs (all labs ordered are listed, but only abnormal results are displayed) Labs Reviewed  CBC WITH DIFFERENTIAL/PLATELET - Abnormal; Notable  for the following components:      Result Value   WBC 2.9 (*)    RBC 3.50 (*)    Hemoglobin 12.8 (*)    HCT 36.5 (*)    MCV 104.3 (*)    MCH 36.6 (*)    Platelets 54 (*)    All other components within normal limits  COMPREHENSIVE METABOLIC PANEL - Abnormal; Notable for the following components:   CO2 21 (*)    BUN 5 (*)    AST 197 (*)    ALT 64 (*)    Total Bilirubin 2.1 (*)    All other components within normal limits  PROTIME-INR - Abnormal; Notable for the following components:   Prothrombin Time 15.7 (*)    INR 1.3 (*)    All other components within normal limits  TYPE AND SCREEN     I reviewed labs and they are notable for blood cell count 2.9, at baseline and a platelet count of 54.  Decreased from 100s in the past and most recently 45.  Chronically elevated liver enzymes consistent with alcohol use history.  EKG  ED  ECG REPORT I, Lucillie Garfinkel, the attending physician, personally viewed and interpreted this ECG.   Date: 07/17/2022  EKG Time: 1047  Rate: 93  Rhythm: normal EKG, normal sinus rhythm  Axis: nl  Intervals: No ischemic changes    RADIOLOGY I independently reviewed and interpreted CT of the head and see no obvious hemorrhage or midline shift   PROCEDURES:  Critical Care performed: No  Procedures   MEDICATIONS ORDERED IN ED: Medications - No data to display  Consultants:  I spoke with neurology consultant regarding the findings on CT scan by radiology of questionable posterior reversible encephalopathic changes, are favored to be chronic and not acute.  IMPRESSION / MDM / ASSESSMENT AND PLAN / ED COURSE  I reviewed the triage vital signs and the nursing notes.                              Differential diagnosis includes, but is not limited to, chronic thrombocytopenia, less likely active bleeding.,  Check CT head for remote fall, thrombocytopenia, to r/o head bleed.   MDM: Patient largely asymptomatic no acute changes who was sent in for  evaluation of thrombocytopenia.  He is thrombocytopenic to the 50s right now, no signs of active bleeding, no indication for transfusion at this time.  He will need ongoing outpatient follow-up to evaluate for underlying etiology for thrombocytopenia.  I spoke with neurologist consultant Dr. Rory Percy about the findings of posterior reversible encephalopathic changes on CT scan which he believes is more likely chronic changes after reviewing MRI with similar changes in the past.  The patient has no acute neurological changes including seizures, or other acute neurological findings on my exam.  Safer discharge home with PMD follow-up and return precautions. Patient's presentation is most consistent with acute presentation with potential threat to life or bodily function.       FINAL CLINICAL IMPRESSION(S) / ED DIAGNOSES   Final diagnoses:  Thrombocytopenia ([place])     Rx / DC Orders   ED Discharge Orders     None        Note:  This document was prepared using Dragon voice recognition software and may include unintentional dictation errors.    Lucillie Garfinkel, MD 07/17/22 1548    Lucillie Garfinkel, MD 08/03/22 [phone]

## 2022-07-18 LAB — CBC
Hematocrit: 37.4 % — ABNORMAL LOW (ref 37.5–51.0)
Hemoglobin: 13.1 g/dL (ref 13.0–17.7)
MCH: 36.7 pg — ABNORMAL HIGH (ref 26.6–33.0)
MCHC: 35 g/dL (ref 31.5–35.7)
MCV: 105 fL — ABNORMAL HIGH (ref 79–97)
RBC: 3.57 x10E6/uL — ABNORMAL LOW (ref 4.14–5.80)
RDW: 13.1 % (ref 11.6–15.4)
WBC: 2.8 10*3/uL — ABNORMAL LOW (ref 3.4–10.8)

## 2022-07-18 LAB — COMPREHENSIVE METABOLIC PANEL
ALT: 66 IU/L — ABNORMAL HIGH (ref 0–44)
AST: 218 IU/L — ABNORMAL HIGH (ref 0–40)
Albumin/Globulin Ratio: 1.4 (ref 1.2–2.2)
Albumin: 4.5 g/dL (ref 3.9–4.9)
Alkaline Phosphatase: 107 IU/L (ref 44–121)
BUN/Creatinine Ratio: 8 — ABNORMAL LOW (ref 10–24)
BUN: 5 mg/dL — ABNORMAL LOW (ref 8–27)
Bilirubin Total: 1.5 mg/dL — ABNORMAL HIGH (ref 0.0–1.2)
CO2: 22 mmol/L (ref 20–29)
Calcium: 9 mg/dL (ref 8.6–10.2)
Chloride: 98 mmol/L (ref 96–106)
Creatinine, Ser: 0.65 mg/dL — ABNORMAL LOW (ref 0.76–1.27)
Globulin, Total: 3.2 g/dL (ref 1.5–4.5)
Glucose: 88 mg/dL (ref 70–99)
Potassium: 4 mmol/L (ref 3.5–5.2)
Sodium: 140 mmol/L (ref 134–144)
Total Protein: 7.7 g/dL (ref 6.0–8.5)
eGFR: 104 mL/min/{1.73_m2} (ref >59)

## 2022-07-18 LAB — VITAMIN B12: Vitamin B-12: 1249 pg/mL — ABNORMAL HIGH (ref 232–1245)

## 2022-07-18 LAB — VITAMIN B1: Thiamine: 90 nmol/L (ref 66.5–200.0)

## 2022-07-20 ENCOUNTER — Telehealth: Payer: Self-pay | Admitting: Family Medicine

## 2022-07-20 NOTE — Telephone Encounter (Signed)
Mark Carr, patient's daughter called and wanted to know if there is any treatment for low platelet count for the patient. She states that the patient had blood work completed at the hospital this passed weekend, but was not given advise for what to do for f/u on abnormal blood results. Please advise Rontrell Moquin [phone], on DPR.  Patient has AWV scheduled 08/03/22 with Dr. Kingsley Callander, please call Mark Carr, pt daughter to reschedule patient with another provider, due to Dr. Brita Romp on maternity leave.

## 2022-07-31 NOTE — Progress Notes (Unsigned)
Annual Wellness Visit     Patient: Mark Carr, Male    DOB: Apr 13, 1956, 66 y.o.   MRN: [id number] Visit Date: 08/03/2022  Today's Provider: Eulis Foster, MD   No chief complaint on file.  Subjective    Mark Carr is a 66 y.o. male who presents today for his Annual Wellness Visit. He reports consuming a {diet types:17450} diet. {Exercise:19826} He generally feels {well/fairly well/poorly:18703}. He reports sleeping {well/fairly well/poorly:18703}. He {does/does not:200015} have additional problems to discuss today.   HPI    Medications: Outpatient Medications Prior to Visit  Medication Sig   amLODipine (NORVASC) 5 MG tablet TAKE ONE TABLET BY MOUTH DAILY   famotidine (PEPCID) 20 MG tablet Take 1 tablet (20 mg total) by mouth daily.   labetalol (NORMODYNE) 200 MG tablet TAKE ONE TABLET BY MOUTH TWICE A DAY   Multiple Vitamin (MULTIVITAMIN WITH MINERALS) TABS tablet Take 1 tablet by mouth daily.   naltrexone (DEPADE) 50 MG tablet Take 1 tablet (50 mg total) by mouth daily.   tadalafil (CIALIS) 20 MG tablet Take 1 tablet (20 mg total) by mouth every three (3) days as needed for erectile dysfunction.   thiamine 100 MG tablet Take 1 tablet (100 mg total) by mouth daily.   No facility-administered medications prior to visit.    No Known Allergies  Patient Care Team: Virginia Crews, MD as PCP - General (Family Medicine)  Review of Systems  {Labs  Heme  Chem  Endocrine  Serology  Results Review (optional):23779}    Objective    Vitals: There were no vitals taken for this visit. {Show previous vital signs (optional):23777}   Physical Exam ***  Most recent functional status assessment:    07/15/2022    8:19 AM  In your present state of health, do you have any difficulty performing the following activities:  Hearing? 0  Vision? 0  Difficulty concentrating or making decisions? 1  Walking or climbing stairs? 0  Dressing or bathing? 0   Doing errands, shopping? 0   Most recent fall risk assessment:    07/15/2022    8:17 AM  Fall Risk   Falls in the past year? 1  Number falls in past yr: 1  Injury with Fall? 0  Risk for fall due to : Other (Comment)  Follow up Falls evaluation completed    Most recent depression screenings:    07/15/2022    8:19 AM 01/29/2022    2:36 PM  PHQ 2/9 Scores  PHQ - 2 Score 0 0  PHQ- 9 Score 1 0   Most recent cognitive screening:     No data to display         Most recent Audit-C alcohol use screening    01/29/2022    2:36 PM  Alcohol Use Disorder Test (AUDIT)  1. How often do you have a drink containing alcohol? 4  2. How many drinks containing alcohol do you have on a typical day when you are drinking? 0  3. How often do you have six or more drinks on one occasion? 0  AUDIT-C Score 4   A score of 3 or more in women, and 4 or more in men indicates increased risk for alcohol abuse, EXCEPT if all of the points are from question 1   No results found for any visits on 08/03/22.  Assessment & Plan     Annual wellness visit done today including the all of the following: Reviewed  patient's Family Medical History Reviewed and updated list of patient's medical providers Assessment of cognitive impairment was done Assessed patient's functional ability Established a written schedule for health screening [place] Completed and Reviewed  Exercise Activities and Dietary recommendations  Goals       "I do not have insurance, I need help with getting a colonoscopy" (pt-stated)      Current Barriers:  Financial constraints Lacks knowledge of community resource: community healthcare  Clinical Social Work Clinical Goal(s):  Over the next 90 days, client will work with SW to address concerns related to community health care  Interventions: Patient interviewed and appropriate assessments performed Confirmed that patient is currently uninsured and has been  denied Medicaid twice in the last year Confirmed with patient that he is currently unemployed and currently receives Fish farm manager only Provided patient with information about applying for [facility] [phone] ext 2 for assistance with getting needed screenings done Encouraged patient to call [facility] to complete the screening and paperwork required Encouraged patient to call this social worker back with questions or assistance with paperwork process Discussed plans with patient for ongoing care management follow up and provided patient with direct contact information for care management team  Patient Self Care Activities:  Performs ADL's independently Performs IADL's independently Calls provider office for new concerns or questions  Please see past updates related to this goal by clicking on the "Past Updates" button in the selected goal          Immunization History  Administered Date(s) Administered   Fluad Quad(high Dose 65+) 12/23/2021   [place] Sars-Covid-2 Vaccination 07/04/2021, 08/01/2021   Pneumococcal Polysaccharide-23 04/03/2021    Health Maintenance  Topic Date Due   TETANUS/TDAP  Never done   COLONOSCOPY (Pts 45-92yr Insurance coverage will need to be confirmed)  Never done   Zoster Vaccines- Shingrix (1 of 2) Never done   COVID-19 Vaccine (3 - Moderna series) 09/26/2021   INFLUENZA VACCINE  06/09/2022   Pneumonia Vaccine 66 Years old (2 - PCV) 06/02/2023 (Originally 04/03/2022)   Hepatitis C Screening  Completed   HPV VACCINES  Aged Out     Discussed health benefits of physical activity, and encouraged him to engage in regular exercise appropriate for his age and condition.    ***  No follow-ups on file.     {provider attestation***:1}   MEulis Foster MD  [phone](phone) [phone](fax)  [place]

## 2022-08-03 ENCOUNTER — Ambulatory Visit (INDEPENDENT_AMBULATORY_CARE_PROVIDER_SITE_OTHER): Payer: Medicare Other | Admitting: Family Medicine

## 2022-08-03 ENCOUNTER — Ambulatory Visit: Payer: Medicare Other | Admitting: Family Medicine

## 2022-08-03 ENCOUNTER — Ambulatory Visit (INDEPENDENT_AMBULATORY_CARE_PROVIDER_SITE_OTHER): Payer: Medicare Other

## 2022-08-03 ENCOUNTER — Encounter: Payer: Self-pay | Admitting: Family Medicine

## 2022-08-03 VITALS — BP 122/86 | HR 102 | Temp 98.2°F | Resp 16 | Ht 68.0 in | Wt 122.0 lb

## 2022-08-03 VITALS — Ht 68.0 in | Wt 121.0 lb

## 2022-08-03 DIAGNOSIS — K7030 Alcoholic cirrhosis of liver without ascites: Secondary | ICD-10-CM

## 2022-08-03 DIAGNOSIS — R296 Repeated falls: Secondary | ICD-10-CM | POA: Diagnosis not present

## 2022-08-03 DIAGNOSIS — Z0000 Encounter for general adult medical examination without abnormal findings: Secondary | ICD-10-CM

## 2022-08-03 DIAGNOSIS — Z1211 Encounter for screening for malignant neoplasm of colon: Secondary | ICD-10-CM

## 2022-08-03 DIAGNOSIS — E43 Unspecified severe protein-calorie malnutrition: Secondary | ICD-10-CM

## 2022-08-03 DIAGNOSIS — Z23 Encounter for immunization: Secondary | ICD-10-CM | POA: Diagnosis not present

## 2022-08-03 NOTE — Assessment & Plan Note (Signed)
Chronic, improved  Reports no falls since last visit  Recommended continued decrease of alcohol consumption  Improved gait observed in clinic

## 2022-08-03 NOTE — Patient Instructions (Signed)
Mark Carr , Thank you for taking time to come for your Medicare Wellness Visit. I appreciate your ongoing commitment to your health goals. Please review the following plan we discussed and let me know if I can assist you in the future.   Screening recommendations/referrals: Colonoscopy: referral sent Recommended yearly ophthalmology/optometry visit for glaucoma screening and checkup Recommended yearly dental visit for hygiene and checkup  Vaccinations: Influenza vaccine: 12/23/21 Pneumococcal vaccine: 04/03/21 Tdap vaccine: n/d Shingles vaccine: n/d   Covid-19: 07/04/21, 08/01/21  Advanced directives: no  Conditions/risks identified: none  Next appointment: Follow up in one year for your annual wellness visit. 08/05/23 @ 8:45 am by phone  Preventive Care 65 Years and Older, Male Preventive care refers to lifestyle choices and visits with your health care provider that can promote health and wellness. What does preventive care include? A yearly physical exam. This is also called an annual well check. Dental exams once or twice a year. Routine eye exams. Ask your health care provider how often you should have your eyes checked. Personal lifestyle choices, including: Daily care of your teeth and gums. Regular physical activity. Eating a healthy diet. Avoiding tobacco and drug use. Limiting alcohol use. Practicing safe sex. Taking low doses of aspirin every day. Taking vitamin and mineral supplements as recommended by your health care provider. What happens during an annual well check? The services and screenings done by your health care provider during your annual well check will depend on your age, overall health, lifestyle risk factors, and family history of disease. Counseling  Your health care provider may ask you questions about your: Alcohol use. Tobacco use. Drug use. Emotional well-being. Home and relationship well-being. Sexual activity. Eating habits. History of  falls. Memory and ability to understand (cognition). Work and work Statistician. Screening  You may have the following tests or measurements: Height, weight, and BMI. Blood pressure. Lipid and cholesterol levels. These may be checked every 5 years, or more frequently if you are over 57 years old. Skin check. Lung cancer screening. You may have this screening every year starting at age 23 if you have a 30-pack-year history of smoking and currently smoke or have quit within the past 15 years. Fecal occult blood test (FOBT) of the stool. You may have this test every year starting at age 37. Flexible sigmoidoscopy or colonoscopy. You may have a sigmoidoscopy every 5 years or a colonoscopy every 10 years starting at age 80. Prostate cancer screening. Recommendations will vary depending on your family history and other risks. Hepatitis C blood test. Hepatitis B blood test. Sexually transmitted disease (STD) testing. Diabetes screening. This is done by checking your blood sugar (glucose) after you have not eaten for a while (fasting). You may have this done every 1-3 years. Abdominal aortic aneurysm (AAA) screening. You may need this if you are a current or former smoker. Osteoporosis. You may be screened starting at age 43 if you are at high risk. Talk with your health care provider about your test results, treatment options, and if necessary, the need for more tests. Vaccines  Your health care provider may recommend certain vaccines, such as: Influenza vaccine. This is recommended every year. Tetanus, diphtheria, and acellular pertussis (Tdap, Td) vaccine. You may need a Td booster every 10 years. Zoster vaccine. You may need this after age 12. Pneumococcal 13-valent conjugate (PCV13) vaccine. One dose is recommended after age 54. Pneumococcal polysaccharide (PPSV23) vaccine. One dose is recommended after age 91. Talk to your health care  provider about which screenings and vaccines you need and  how often you need them. This information is not intended to replace advice given to you by your health care provider. Make sure you discuss any questions you have with your health care provider. Document Released: 11/22/2015 Document Revised: 07/15/2016 Document Reviewed: 08/27/2015 Elsevier Interactive Patient Education  2017 [place] Prevention in the Home Falls can cause injuries. They can happen to people of all ages. There are many things you can do to make your home safe and to help prevent falls. What can I do on the outside of my home? Regularly fix the edges of walkways and driveways and fix any cracks. Remove anything that might make you trip as you walk through a door, such as a raised step or threshold. Trim any bushes or trees on the path to your home. Use bright outdoor lighting. Clear any walking paths of anything that might make someone trip, such as rocks or tools. Regularly check to see if handrails are loose or broken. Make sure that both sides of any steps have handrails. Any raised decks and porches should have guardrails on the edges. Have any leaves, snow, or ice cleared regularly. Use sand or salt on walking paths during winter. Clean up any spills in your garage right away. This includes oil or grease spills. What can I do in the bathroom? Use night lights. Install grab bars by the toilet and in the tub and shower. Do not use towel bars as grab bars. Use non-skid mats or decals in the tub or shower. If you need to sit down in the shower, use a plastic, non-slip stool. Keep the floor dry. Clean up any water that spills on the floor as soon as it happens. Remove soap buildup in the tub or shower regularly. Attach bath mats securely with double-sided non-slip rug tape. Do not have throw rugs and other things on the floor that can make you trip. What can I do in the bedroom? Use night lights. Make sure that you have a light by your bed that is easy to  reach. Do not use any sheets or blankets that are too big for your bed. They should not hang down onto the floor. Have a firm chair that has side arms. You can use this for support while you get dressed. Do not have throw rugs and other things on the floor that can make you trip. What can I do in the kitchen? Clean up any spills right away. Avoid walking on wet floors. Keep items that you use a lot in easy-to-reach places. If you need to reach something above you, use a strong step stool that has a grab bar. Keep electrical cords out of the way. Do not use floor polish or wax that makes floors slippery. If you must use wax, use non-skid floor wax. Do not have throw rugs and other things on the floor that can make you trip. What can I do with my stairs? Do not leave any items on the stairs. Make sure that there are handrails on both sides of the stairs and use them. Fix handrails that are broken or loose. Make sure that handrails are as long as the stairways. Check any carpeting to make sure that it is firmly attached to the stairs. Fix any carpet that is loose or worn. Avoid having throw rugs at the top or bottom of the stairs. If you do have throw rugs, attach them to the  floor with carpet tape. Make sure that you have a light switch at the top of the stairs and the bottom of the stairs. If you do not have them, ask someone to add them for you. What else can I do to help prevent falls? Wear shoes that: Do not have high heels. Have rubber bottoms. Are comfortable and fit you well. Are closed at the toe. Do not wear sandals. If you use a stepladder: Make sure that it is fully opened. Do not climb a closed stepladder. Make sure that both sides of the stepladder are locked into place. Ask someone to hold it for you, if possible. Clearly mark and make sure that you can see: Any grab bars or handrails. First and last steps. Where the edge of each step is. Use tools that help you move  around (mobility aids) if they are needed. These include: Canes. Walkers. Scooters. Crutches. Turn on the lights when you go into a dark area. Replace any light bulbs as soon as they burn out. Set up your furniture so you have a clear path. Avoid moving your furniture around. If any of your floors are uneven, fix them. If there are any pets around you, be aware of where they are. Review your medicines with your doctor. Some medicines can make you feel dizzy. This can increase your chance of falling. Ask your doctor what other things that you can do to help prevent falls. This information is not intended to replace advice given to you by your health care provider. Make sure you discuss any questions you have with your health care provider. Document Released: 08/22/2009 Document Revised: 04/02/2016 Document Reviewed: 11/30/2014 Elsevier Interactive Patient Education  2017 [employer].

## 2022-08-03 NOTE — Assessment & Plan Note (Signed)
Chronic, stable  Gained 1 lb since last office visit  Recommended increasing meal supplements  Discussed dietary recommendations to increase protein intake  Patient will continue thiamine '100mg'$  daily

## 2022-08-03 NOTE — Assessment & Plan Note (Addendum)
Chronic, stable  Patient reports adherence to naltrexone '50mg'$  daily and helped with decreased cravings, reports decreased alcohol consumption  Recommended CCM to help with management of chronic alcohol use and cirrhosis diagnosis  Patient will continue thiamine '100mg'$  daily  CMP today

## 2022-08-03 NOTE — Progress Notes (Signed)
Virtual Visit via Telephone Note  I connected with  Mark Carr on 08/03/22 at  9:00 AM EDT by telephone and verified that I am speaking with the correct person using two identifiers.  Location: Patient: home Provider: [facility] Persons participating in the virtual visit: [place]   I discussed the limitations, risks, security and privacy concerns of performing an evaluation and management service by telephone and the availability of in person appointments. The patient expressed understanding and agreed to proceed.  Interactive audio and video telecommunications were attempted between this nurse and patient, however failed, due to patient having technical difficulties OR patient did not have access to video capability.  We continued and completed visit with audio only.  Some vital signs may be absent or patient reported.   Dionisio David, LPN  Subjective:   Mark Carr is a 66 y.o. male who presents for Medicare Annual/Subsequent preventive examination.  Review of Systems           Objective:    There were no vitals filed for this visit. There is no height or weight on file to calculate BMI.     07/17/2022   10:42 AM 06/16/2021    4:30 PM 04/19/2021    5:48 AM 04/03/2021    1:00 AM 04/01/2021    5:27 PM 04/01/2021    8:25 AM 10/28/2018    9:04 AM  Advanced Directives  Does Patient Have a Medical Advance Directive? No Unable to assess, patient is non-responsive or altered mental status Yes Yes Yes No No  Type of Advance Directive   Healthcare Power of [place]     Does patient want to make changes to medical advance directive?   Yes (ED - Information included in AVS) Yes (Inpatient - patient requests chaplain consult to change a medical advance directive) No - Patient declined    Copy of [place] in Chart?   No - copy requested No - copy requested     Would patient like information on creating a medical advance  directive? No - Patient declined   Yes (Inpatient - patient requests chaplain consult to create a medical advance directive) No - Patient declined  No - Patient declined    Current Medications (verified) Outpatient Encounter Medications as of 08/03/2022  Medication Sig   amLODipine (NORVASC) 5 MG tablet TAKE ONE TABLET BY MOUTH DAILY   CLENPIQ 10-3.5-12 MG-GM -GM/175ML SOLN Take by mouth.   famotidine (PEPCID) 20 MG tablet Take 1 tablet (20 mg total) by mouth daily.   labetalol (NORMODYNE) 200 MG tablet TAKE ONE TABLET BY MOUTH TWICE A DAY   Multiple Vitamin (MULTIVITAMIN WITH MINERALS) TABS tablet Take 1 tablet by mouth daily.   naltrexone (DEPADE) 50 MG tablet Take 1 tablet (50 mg total) by mouth daily.   tadalafil (CIALIS) 20 MG tablet Take 1 tablet (20 mg total) by mouth every three (3) days as needed for erectile dysfunction.   tadalafil (CIALIS) 20 MG tablet Take by mouth.   thiamine 100 MG tablet Take 1 tablet (100 mg total) by mouth daily.   No facility-administered encounter medications on file as of 08/03/2022.    Allergies (verified) Patient has no known allergies.   History: Past Medical History:  Diagnosis Date        Acute encephalopathy 06/16/2021   Cirrhosis of liver ([place])    Coag negative Staphylococcus bacteremia 04/21/2021   Wrist fracture    Past Surgical History:  Procedure Laterality Date   WRIST FRACTURE SURGERY Right 2016   Family History  Problem Relation Age of Onset   Hypertension Mother    Deep vein thrombosis Mother    Cancer Sister        unknown type   Heart disease Brother    Cancer Maternal Grandmother    Social History   Socioeconomic History   Marital status: Single    Spouse name: Not on file   Number of children: 4   Years of education: 11   Highest education level: 11th grade  Occupational History   Not on file  Tobacco Use   Smoking status: Every Day    Packs/day: 0.50    Years: 40.00    Total pack years: 20.00    Types:  Cigarettes   Smokeless tobacco: Never  Vaping Use   Vaping Use: Never used  Substance and Sexual Activity   Alcohol use: Yes    Alcohol/week: 4.0 standard drinks of alcohol    Types: 4 Cans of beer per week    Comment: 4 beers a day   Drug use: Not Currently   Sexual activity: Not Currently  Other Topics Concern   Not on file  Social History Narrative   Not on file   Social Determinants of Health   Financial Resource Strain: Low Risk  (06/21/2019)   Overall Financial Resource Strain (CARDIA)    Difficulty of Paying Living Expenses: Not hard at all  Food Insecurity: No Food Insecurity (06/21/2019)   Hunger Vital Sign    Worried About Running Out of Food in the Last Year: Never true    Ran Out of Food in the Last Year: Never true  Transportation Needs: No Transportation Needs (06/21/2019)   PRAPARE - Hydrologist (Medical): No    Lack of Transportation (Non-Medical): No  Physical Activity: Sufficiently Active (06/21/2019)   Exercise Vital Sign    Days of Exercise per Week: 3 days    Minutes of Exercise per Session: 90 min  Stress: No Stress Concern Present (06/21/2019)   [place]    Feeling of Stress : Not at all  Social Connections: Socially Isolated (06/21/2019)   Social Connection and Isolation Panel [NHANES]    Frequency of Communication with Friends and Family: More than three times a week    Frequency of Social Gatherings with Friends and Family: More than three times a week    Attends Religious Services: Never    Marine scientist or Organizations: No    Attends Music therapist: Never    Marital Status: Separated    Tobacco Counseling Ready to quit: Not Answered Counseling given: Not Answered   Clinical Intake:  Pre-visit preparation completed: Yes  Pain : No/denies pain     Nutritional Risks: None Diabetes: No  How often do you need to have  someone help you when you read instructions, pamphlets, or other written materials from your doctor or pharmacy?: 1 - Never  Diabetic?no  Interpreter Needed?: No  Information entered by :: Kirke Shaggy, LPN   Activities of Daily Living    07/15/2022    8:19 AM 01/29/2022    2:36 PM  In your present state of health, do you have any difficulty performing the following activities:  Hearing? 0 0  Vision? 0 1  Difficulty concentrating or making decisions? 1 0  Walking or climbing stairs? 0 0  Dressing  or bathing? 0 0  Doing errands, shopping? 0 0    Patient Care Team: Virginia Crews, MD as PCP - General (Family Medicine)  Indicate any recent Medical Services you may have received from other than [facility] providers in the past year (date may be approximate).     Assessment:   This is a routine wellness examination for [place].  Hearing/Vision screen No results found.  Dietary issues and exercise activities discussed:     Goals Addressed   None    Depression Screen    07/15/2022    8:19 AM 01/29/2022    2:36 PM 12/23/2021    3:18 PM 05/13/2021    9:23 AM 06/21/2019    1:41 PM 05/16/2019    9:23 AM  PHQ 2/9 Scores  PHQ - 2 Score 0 0 0 0 0 0  PHQ- 9 Score 1 0  0      Fall Risk    07/15/2022    8:17 AM 01/29/2022    2:37 PM 12/23/2021    3:18 PM 05/13/2021    9:25 AM  Fall Risk   Falls in the past year? 1 0 0 0  Number falls in past yr: 1 0 0 0  Injury with Fall? 0 0 0 0  Risk for fall due to : Other (Comment) No Fall Risks    Follow up Falls evaluation completed Falls evaluation completed      [place]:  Any stairs in or around the home? No  If so, are there any without handrails? No  Home free of loose throw rugs in walkways, pet beds, electrical cords, etc? Yes  Adequate lighting in your home to reduce risk of falls? Yes   ASSISTIVE DEVICES UTILIZED TO PREVENT FALLS:  Life alert? No  Use of a cane, walker or w/c? No  Grab  bars in the bathroom? No  Shower chair or bench in shower? Yes  Elevated toilet seat or a handicapped toilet? No   Cognitive Function:declined test 2023 PT is A & O        Immunizations Immunization History  Administered Date(s) Administered   Fluad Quad(high Dose 65+) 12/23/2021   Moderna Sars-Covid-2 Vaccination 07/04/2021, 08/01/2021   Pneumococcal Polysaccharide-23 04/03/2021    TDAP status: Due, Education has been provided regarding the importance of this vaccine. Advised may receive this vaccine at local pharmacy or Health Dept. Aware to provide a copy of the vaccination record if obtained from local pharmacy or Health Dept. Verbalized acceptance and understanding.  Flu Vaccine status: Up to date  Pneumococcal vaccine status: Due, Education has been provided regarding the importance of this vaccine. Advised may receive this vaccine at local pharmacy or Health Dept. Aware to provide a copy of the vaccination record if obtained from local pharmacy or Health Dept. Verbalized acceptance and understanding.  Covid-19 vaccine status: Completed vaccines  Qualifies for Shingles Vaccine? Yes   Zostavax completed No   Shingrix Completed?: No.    Education has been provided regarding the importance of this vaccine. Patient has been advised to call insurance company to determine out of pocket expense if they have not yet received this vaccine. Advised may also receive vaccine at local pharmacy or Health Dept. Verbalized acceptance and understanding.  Screening Tests Health Maintenance  Topic Date Due   TETANUS/TDAP  Never done   COLONOSCOPY (Pts 45-62yr Insurance coverage will need to be confirmed)  Never done   Zoster Vaccines- Shingrix (1 of  2) Never done   COVID-19 Vaccine (3 - Moderna series) 09/26/2021   INFLUENZA VACCINE  06/09/2022   Pneumonia Vaccine 51+ Years old (2 - PCV) 06/02/2023 (Originally 04/03/2022)   Hepatitis C Screening  Completed   HPV VACCINES  Aged Out     Health Maintenance  Health Maintenance Due  Topic Date Due   TETANUS/TDAP  Never done   COLONOSCOPY (Pts 45-57yr Insurance coverage will need to be confirmed)  Never done   Zoster Vaccines- Shingrix (1 of 2) Never done   COVID-19 Vaccine (3 - Moderna series) 09/26/2021   INFLUENZA VACCINE  06/09/2022    Colorectal cancer screening: Referral to GI placed today. Pt aware the office will call re: appt.  Lung Cancer Screening: (Low Dose CT Chest recommended if Age 66-80years, 30 pack-year currently smoking OR have quit w/in 15years.) does not qualify.   Additional Screening:  Hepatitis C Screening: does qualify; Completed 06/01/22  Vision Screening: Recommended annual ophthalmology exams for early detection of glaucoma and other disorders of the eye. Is the patient up to date with their annual eye exam?  No  Who is the provider or what is the name of the office in which the patient attends annual eye exams? No one If pt is not established with a provider, would they like to be referred to a provider to establish care? No .   Dental Screening: Recommended annual dental exams for proper oral hygiene  Community Resource Referral / Chronic Care Management: CRR required this visit?  No   CCM required this visit?  No      Plan:     I have personally reviewed and noted the following in the patient's chart:   Medical and social history Use of alcohol, tobacco or illicit drugs  Current medications and supplements including opioid prescriptions. Patient is not currently taking opioid prescriptions. Functional ability and status Nutritional status Physical activity Advanced directives List of other physicians Hospitalizations, surgeries, and ER visits in previous 12 months Vitals Screenings to include cognitive, depression, and falls Referrals and appointments  In addition, I have reviewed and discussed with patient certain preventive protocols, quality metrics, and best  practice recommendations. A written personalized care plan for preventive services as well as general preventive health recommendations were provided to patient.     LDionisio David LPN   [id number]  Nurse Notes: none

## 2022-08-04 ENCOUNTER — Other Ambulatory Visit: Payer: Self-pay

## 2022-08-04 ENCOUNTER — Telehealth: Payer: Self-pay

## 2022-08-04 DIAGNOSIS — Z1211 Encounter for screening for malignant neoplasm of colon: Secondary | ICD-10-CM

## 2022-08-04 LAB — COMPREHENSIVE METABOLIC PANEL
ALT: 19 IU/L (ref 0–44)
AST: 41 IU/L — ABNORMAL HIGH (ref 0–40)
Albumin/Globulin Ratio: 1.4 (ref 1.2–2.2)
Albumin: 4.4 g/dL (ref 3.9–4.9)
Alkaline Phosphatase: 118 IU/L (ref 44–121)
BUN/Creatinine Ratio: 8 — ABNORMAL LOW (ref 10–24)
BUN: 6 mg/dL — ABNORMAL LOW (ref 8–27)
Bilirubin Total: 0.5 mg/dL (ref 0.0–1.2)
CO2: 20 mmol/L (ref 20–29)
Calcium: 9.7 mg/dL (ref 8.6–10.2)
Chloride: 108 mmol/L — ABNORMAL HIGH (ref 96–106)
Creatinine, Ser: 0.74 mg/dL — ABNORMAL LOW (ref 0.76–1.27)
Globulin, Total: 3.2 g/dL (ref 1.5–4.5)
Glucose: 80 mg/dL (ref 70–99)
Potassium: 4.1 mmol/L (ref 3.5–5.2)
Sodium: 143 mmol/L (ref 134–144)
Total Protein: 7.6 g/dL (ref 6.0–8.5)
eGFR: 100 mL/min/{1.73_m2} (ref >59)

## 2022-08-04 NOTE — Telephone Encounter (Signed)
Gastroenterology Pre-Procedure Review  Request Date: 11/06/22 Requesting Physician: Dr. Marius Ditch  PATIENT REVIEW QUESTIONS: The patient responded to the following health history questions as indicated:    1. Are you having any GI issues? no 2. Do you have a personal history of Polyps? no 3. Do you have a family history of Colon Cancer or Polyps? no 4. Diabetes Mellitus? no 5. Joint replacements in the past 12 months?no 6. Major health problems in the past 3 months?no 7. Any artificial heart valves, MVP, or defibrillator?no    MEDICATIONS & ALLERGIES:    Patient reports the following regarding taking any anticoagulation/antiplatelet therapy:   Plavix, Coumadin, Eliquis, Xarelto, Lovenox, Pradaxa, Brilinta, or Effient? no Aspirin? no  Patient confirms/reports the following medications:  Current Outpatient Medications  Medication Sig Dispense Refill   amLODipine (NORVASC) 5 MG tablet TAKE ONE TABLET BY MOUTH DAILY 90 tablet 1   CLENPIQ 10-3.5-12 MG-GM -GM/175ML SOLN Take by mouth.     famotidine (PEPCID) 20 MG tablet Take 1 tablet (20 mg total) by mouth daily. 90 tablet 3   labetalol (NORMODYNE) 200 MG tablet TAKE ONE TABLET BY MOUTH TWICE A DAY 180 tablet 2   Multiple Vitamin (MULTIVITAMIN WITH MINERALS) TABS tablet Take 1 tablet by mouth daily. 30 tablet 1   naltrexone (DEPADE) 50 MG tablet Take 1 tablet (50 mg total) by mouth daily. 30 tablet 0   thiamine 100 MG tablet Take 1 tablet (100 mg total) by mouth daily. 30 tablet 1   No current facility-administered medications for this visit.    Patient confirms/reports the following allergies:  No Known Allergies  No orders of the defined types were placed in this encounter.   AUTHORIZATION INFORMATION Primary Insurance: 1D#: Group #:  Secondary Insurance: 1D#: Group #:  SCHEDULE INFORMATION: Date: 11/06/22 Time: Location: [facility]

## 2022-09-14 ENCOUNTER — Ambulatory Visit: Payer: Self-pay

## 2022-09-14 NOTE — Telephone Encounter (Signed)
  Chief Complaint: hematuria  Symptoms: blood in urine, painful at times Frequency: 2 weeks  Pertinent Negatives: NA Disposition: '[]'$ ED /'[]'$ Urgent Care (no appt availability in office) / '[x]'$ Appointment(In office/virtual)/ '[]'$  [place] [facility]/ '[]'$ Home Care/ '[]'$ Refused Recommended Disposition /'[]'$ [facility]/ '[]'$  Follow-up with PCP Additional Notes: Pt's daughter [name] calling to report pt's sx. She was told by her mom about the hematuria. Reports that pt may be having pain with urination but hard to tell since pt is alcoholic and urinates frequently. Denies pt having fever. Wants to let pt know about appt so requested appt for 09/16/22 at 1500.   Reason for Disposition  Blood in urine  (Exception: Could be normal menstrual bleeding.)  Answer Assessment - Initial Assessment Questions 1. COLOR of URINE: "Describe the color of the urine."  (e.g., tea-colored, pink, red, bloody) "Do you have blood clots in your urine?" (e.g., none, pea, grape, small coin)     Blood in urine  2. ONSET: "When did the bleeding start?"      2 weeks ago  4. PAIN with URINATION: "Is there any pain with passing your urine?" If Yes, ask: "How bad is the pain?"  (Scale 1-10; or mild, moderate, severe)    - MILD: Complains slightly about urination hurting.    - MODERATE: Interferes with normal activities.      - SEVERE: Excruciating, unwilling or unable to urinate because of the pain.      Mild at times  5. FEVER: "Do you have a fever?" If Yes, ask: "What is your temperature, how was it measured, and when did it start?"     no  Protocols used: Urine - Blood In-A-AH

## 2022-09-16 ENCOUNTER — Ambulatory Visit: Payer: Medicare Other | Admitting: Physician Assistant

## 2022-10-04 ENCOUNTER — Other Ambulatory Visit: Payer: Self-pay | Admitting: Family Medicine

## 2022-10-15 ENCOUNTER — Emergency Department
Admission: EM | Admit: 2022-10-15 | Discharge: 2022-10-15 | Disposition: A | Discharge status: Home / Self Care | Payer: Medicare Other | Attending: Emergency Medicine | Admitting: Emergency Medicine

## 2022-10-15 ENCOUNTER — Emergency Department: Payer: Medicare Other

## 2022-10-15 DIAGNOSIS — R55 Syncope and collapse: Secondary | ICD-10-CM | POA: Insufficient documentation

## 2022-10-15 DIAGNOSIS — Z20822 Contact with and (suspected) exposure to covid-19: Secondary | ICD-10-CM | POA: Insufficient documentation

## 2022-10-15 DIAGNOSIS — F10130 Alcohol abuse with withdrawal, uncomplicated: Secondary | ICD-10-CM | POA: Insufficient documentation

## 2022-10-15 DIAGNOSIS — F1010 Alcohol abuse, uncomplicated: Secondary | ICD-10-CM

## 2022-10-15 LAB — COMPREHENSIVE METABOLIC PANEL
ALT: 28 U/L (ref 0–44)
AST: 81 U/L — ABNORMAL HIGH (ref 15–41)
Albumin: 3.4 g/dL — ABNORMAL LOW (ref 3.5–5.0)
Alkaline Phosphatase: 74 U/L (ref 38–126)
Anion gap: 13 (ref 5–15)
BUN: 8 mg/dL (ref 8–23)
CO2: 20 mmol/L — ABNORMAL LOW (ref 22–32)
Calcium: 8.1 mg/dL — ABNORMAL LOW (ref 8.9–10.3)
Chloride: 107 mmol/L (ref 98–111)
Creatinine, Ser: 0.65 mg/dL (ref 0.61–1.24)
GFR, Estimated: >60 mL/min (ref >60)
Glucose, Bld: 95 mg/dL (ref 70–99)
Potassium: 3.9 mmol/L (ref 3.5–5.1)
Sodium: 140 mmol/L (ref 135–145)
Total Bilirubin: 1.2 mg/dL (ref 0.3–1.2)
Total Protein: 7.1 g/dL (ref 6.5–8.1)

## 2022-10-15 LAB — CBC
HCT: 40.9 % (ref 39.0–52.0)
Hemoglobin: 13.7 g/dL (ref 13.0–17.0)
MCH: 35.7 pg — ABNORMAL HIGH (ref 26.0–34.0)
MCHC: 33.5 g/dL (ref 30.0–36.0)
MCV: 106.5 fL — ABNORMAL HIGH (ref 80.0–100.0)
Platelets: 104 10*3/uL — ABNORMAL LOW (ref 150–400)
RBC: 3.84 MIL/uL — ABNORMAL LOW (ref 4.22–5.81)
RDW: 13.6 % (ref 11.5–15.5)
WBC: 3.7 10*3/uL — ABNORMAL LOW (ref 4.0–10.5)
nRBC: 0 % (ref 0.0–0.2)

## 2022-10-15 LAB — TROPONIN I (HIGH SENSITIVITY)
Troponin I (High Sensitivity): 3 ng/L (ref <18)
Troponin I (High Sensitivity): 4 ng/L (ref <18)

## 2022-10-15 LAB — RESP PANEL BY RT-PCR (FLU A&B, COVID) ARPGX2
Influenza A by PCR: NEGATIVE
Influenza B by PCR: NEGATIVE
SARS Coronavirus 2 by RT PCR: NEGATIVE

## 2022-10-15 LAB — ETHANOL: Alcohol, Ethyl (B): 154 mg/dL — ABNORMAL HIGH (ref <10)

## 2022-10-15 MED ORDER — SODIUM CHLORIDE 0.9 % IV BOLUS
500.0000 mL | Freq: Once | INTRAVENOUS | Status: AC
  Administered 2022-10-15: 500 mL via INTRAVENOUS

## 2022-10-15 MED ORDER — LORAZEPAM 2 MG/ML IJ SOLN
1.0000 mg | Freq: Once | INTRAMUSCULAR | Status: AC
Start: 2022-10-15 — End: 2022-10-15
  Administered 2022-10-15: 1 mg via INTRAVENOUS
  Filled 2022-10-15: qty 1

## 2022-10-15 MED ORDER — LORAZEPAM 2 MG PO TABS
2.0000 mg | ORAL_TABLET | Freq: Once | ORAL | Status: AC
  Administered 2022-10-15: 2 mg via ORAL
  Filled 2022-10-15: qty 1

## 2022-10-15 MED ORDER — LORAZEPAM 2 MG/ML IJ SOLN
1.0000 mg | Freq: Once | INTRAMUSCULAR | Status: AC
  Administered 2022-10-15: 1 mg via INTRAVENOUS
  Filled 2022-10-15: qty 1

## 2022-10-15 MED ORDER — SODIUM CHLORIDE 0.9 % IV BOLUS
1000.0000 mL | Freq: Once | INTRAVENOUS | Status: AC
  Administered 2022-10-15: 1000 mL via INTRAVENOUS

## 2022-10-15 MED ORDER — NICOTINE 21 MG/24HR TD PT24
21.0000 mg | MEDICATED_PATCH | Freq: Once | TRANSDERMAL | Status: DC
  Administered 2022-10-15: 21 mg via TRANSDERMAL
  Filled 2022-10-15: qty 1

## 2022-10-15 MED ORDER — CHLORDIAZEPOXIDE HCL 25 MG PO CAPS
25.0000 mg | ORAL_CAPSULE | Freq: Once | ORAL | Status: AC
  Administered 2022-10-15: 25 mg via ORAL
  Filled 2022-10-15: qty 1

## 2022-10-15 NOTE — ED Triage Notes (Signed)
Pt arrived to ED via [facility]. Unresponsive at home, responded to stimulation, now alert and oriented. BP 80/55 initially, 130/55 after 513m bolus. Pt reported to be drinking heavily and not taking blood pressure meds which were prescribed to keep BP up. Hx of cirrhosis of the liver.

## 2022-10-15 NOTE — ED Provider Notes (Addendum)
[facility] Provider Note    Event Date/Time   First MD Initiated Contact with Patient 10/15/22 [phone]     (approximate)  History   Chief Complaint:  HPI  Mark Carr is a 66 y.o. male with a past medical history of cirrhosis, alcohol abuse, presents to the emergency department after syncopal episode this morning.  According to the patient he was drinking last night, got up this morning and was "not feeling well."  Patient per report had a brief syncopal episode.  Patient does not recall the episode does not recall what happened.  EMS states wife called 911.  Patient does admit to drinking heavily last night but is not drinking anything today.  Patient is somewhat tremulous throughout exam.  EMS states initial hypotension 80s over 50s however after 500 cc of normal saline currently 664 systolic on arrival.  Physical Exam   Most recent vital signs: There were no vitals filed for this visit.  General: Awake, no distress.  Somewhat tremulous. CV:  Good peripheral perfusion.  Regular rate and rhythm  Resp:  Normal effort.  Equal breath sounds bilaterally.  Abd:  No distention.  Soft, nontender.  No rebound or guarding.   ED Results / Procedures / Treatments   EKG  EKG viewed and interpreted by myself shows a normal sinus rhythm at 84 bpm with a narrow QRS, normal axis, normal intervals, nonspecific ST changes.  RADIOLOGY  Reviewed and interpreted the CT head images.  Patient appears to have large ventricles but no intracranial bleed seen on my evaluation. Radiology has read no acute findings.   MEDICATIONS ORDERED IN ED: Medications  sodium chloride 0.9 % bolus 1,000 mL (has no administration in time range)  sodium chloride 0.9 % bolus 1,000 mL (has no administration in time range)  LORazepam (ATIVAN) injection 1 mg (has no administration in time range)     IMPRESSION / MDM / ASSESSMENT AND PLAN / ED COURSE  I reviewed the triage vital signs and  the nursing notes.  Patient's presentation is most consistent with acute presentation with potential threat to life or bodily function.  Patient presents to the emergency department for a syncopal episode at home.  Patient does admit to heavy alcohol use last night.  Does appear somewhat tremulous.  We will check labs including cardiac enzymes.  Will IV hydrate and dose 1 mg of IV Ativan for possible early alcohol withdrawal.  Will obtain CT imaging of the head as it is unclear if the patient hit his head during the syncopal episode.  Patient was initially hypotensive now is Norimode to hypertensive, possibly dehydration or hypovolemia induced.  Differential would also include ACS, metabolic or electrolyte abnormality, renal insufficiency, infectious etiology.  Patient's workup today shows a head CT with no acute findings.  Alcohol is elevated 154.  Remainder the workup is largely reassuring including negative troponin, negative COVID and flu, reassuring chemistry with mildly elevated anion gap around 13, patient receiving 2 L of normal saline.  CBC shows no concerning findings, longstanding thrombocytopenia.  Patient is awake alert, states he is feeling better.  Blood pressure remains normotensive.  Wife is 0 with the patient who witnessed the syncopal event denies any head injury or trauma.  She states the patient has been drinking very heavily all week and she believes it could be alcohol induced.  Alcohol could definitely play a factor in this leading to dehydration/hypovolemia.  Lab work overall reassuring.  Patient feeling better after  fluids.  As the patient appears well I believe he would be safe for discharge home with PCP follow-up.  I discussed with the patient the importance of reducing or eliminating alcohol as well as continue to eat and drink a healthy diet and a healthy amount of other fluids besides alcohol.  Patient and family agreeable to plan.  Repeat troponin is negative.  Patient  continues to appear well.  Will discharge with PCP follow-up.   ----------------------------------------- 2:02 PM on 10/15/2022 -----------------------------------------  Patient was being discharged from the emergency department however could not ambulate due to significant ataxia and tremor once he got to his car.  We brought the patient back to the emergency department where I evaluated the patient once again.  He appears to now have very significant alcohol withdrawal symptoms.  He is tremulous.  States he feels fine and is ready to go home but I attempted to ambulate the patient myself and he is only able to ambulate 2 or 3 steps being very shaky and almost falling.  I discussed with the patient Ativan to help with withdrawal symptoms.  Patient is not interested in detoxing and quitting alcohol.  We will dose Ativan and reassess to see if the patient is able to go home otherwise patient may require admission to the hospital for alcohol detox and withdrawal symptoms.  ----------------------------------------- 3:11 PM on 10/15/2022 -----------------------------------------  Patient remains somewhat tremulous although improved after Ativan.  I spoke to the patient's wife who states the patient usually starts drinking first thing in the morning.  I highly suspect the patient's ataxia and diffuse tremors are due to alcohol withdrawal.  I offered to admit the patient to the hospital for alcohol detox.  Patient strongly is opposed to this and says he wants to go home.  I discussed with the patient that the tremors will likely continue to worsen until the patient begins drinking alcohol again.  He states he is not ready to stop drinking and he is going to drink when he goes home.  I discussed this with the wife as well that the patient will be very unsteady on his feet while he is in alcohol withdrawal.  I had a long discussion with the patient the we would like for the patient to diminish his alcohol  intake considerably I suggested 25% reduction per week.  Patient is agreeable.  I also discussed that the patient goes home and remains tremulous and is not able to drink or becomes nauseated and begins vomiting unable to drink alcohol then the patient needs to return to the emergency department for likely admission.  Offered admission and the patient declines wishes to go home we will discharge home.  FINAL CLINICAL IMPRESSION(S) / ED DIAGNOSES   Syncope Alcohol abuse Alcohol withdrawal   Note:  This document was prepared using Dragon voice recognition software and may include unintentional dictation errors.   Harvest Dark, MD 10/15/22 1159    Harvest Dark, MD 10/15/22 1513

## 2022-11-06 ENCOUNTER — Encounter: Admission: RE | Payer: Self-pay | Source: Ambulatory Visit

## 2022-11-06 ENCOUNTER — Ambulatory Visit: Admission: RE | Admit: 2022-11-06 | Payer: Medicare Other | Source: Ambulatory Visit | Admitting: Gastroenterology

## 2022-11-06 SURGERY — COLONOSCOPY WITH PROPOFOL
Anesthesia: General

## 2022-11-08 ENCOUNTER — Other Ambulatory Visit: Payer: Self-pay

## 2022-11-08 ENCOUNTER — Emergency Department: Payer: Medicare Other

## 2022-11-08 ENCOUNTER — Emergency Department
Admission: EM | Admit: 2022-11-08 | Discharge: 2022-11-08 | Disposition: A | Discharge status: Home / Self Care | Payer: Medicare Other | Attending: Emergency Medicine | Admitting: Emergency Medicine

## 2022-11-08 DIAGNOSIS — W010XXA Fall on same level from slipping, tripping and stumbling without subsequent striking against object, initial encounter: Secondary | ICD-10-CM | POA: Diagnosis not present

## 2022-11-08 DIAGNOSIS — W19XXXA Unspecified fall, initial encounter: Secondary | ICD-10-CM

## 2022-11-08 DIAGNOSIS — T797XXA Traumatic subcutaneous emphysema, initial encounter: Secondary | ICD-10-CM | POA: Insufficient documentation

## 2022-11-08 DIAGNOSIS — S0083XA Contusion of other part of head, initial encounter: Secondary | ICD-10-CM | POA: Diagnosis not present

## 2022-11-08 DIAGNOSIS — S0993XA Unspecified injury of face, initial encounter: Secondary | ICD-10-CM | POA: Diagnosis present

## 2022-11-08 MED ORDER — AMOXICILLIN-POT CLAVULANATE 875-125 MG PO TABS: 1.0000 | ORAL_TABLET | Freq: Two times a day (BID) | ORAL | 0 refills | Status: AC

## 2022-11-08 NOTE — ED Triage Notes (Signed)
Patient is here tonight for a fall with injury to his face and RIGHT hand; Patient's daughter is here and she reports that patient is a malnourished alcoholic who has frequent falls and was here recently for the same; She witnessed the fall and states that patient did not lose consciousness when he fell; He does not take anticoagulants

## 2022-11-08 NOTE — ED Triage Notes (Signed)
Family here with pt

## 2022-11-08 NOTE — ED Notes (Addendum)
Pt present with swelling and  small nonb-bleeding lac under right eye.

## 2022-11-08 NOTE — ED Provider Notes (Signed)
[facility] Provider Note  Patient Contact: 7:49 PM (approximate)   History   Fall (Patient is here tonight for a fall with injury to his face and RIGHT hand; Patient's daughter is here and she reports that patient is a malnourished alcoholic who has frequent falls and was here recently for the same; She witnessed the fall and states that patient did not lose consciousness when he fell; He does not take anticoagulants)   HPI  Mark Carr is a 66 y.o. male who presents the emergency department with family after he tripped and fell and hit his right face.  Patient is known to this department, has multiple fall secondary to alcohol consumption.  Patient was seen recently, with the amount of wait time in the emergency department actually started to have withdrawals.  Family reports that the fall.  Mechanical in nature.  He did hit the side of his face but did not lose consciousness.  His only complaint is facial pain.  He denies any extremity injuries to include shoulders, arms, hips, knees, legs.  Patient denies any trauma to the abdomen or chest.     Physical Exam   Triage Vital Signs: ED Triage Vitals [11/08/22 1930]  Enc Vitals Group     BP (!) 139/98     Pulse Rate (!) 106     Resp (!) 24     Temp 98.9 F (37.2 C)     Temp Source Oral     SpO2 95 %     Weight 121 lb 14.6 oz (55.3 kg)     Height '5\' 8"'$  (1.727 m)     Head Circumference      Peak Flow      Pain Score 0     Pain Loc      Pain Edu?      Excl. in [place]?     Most recent vital signs: Vitals:   11/08/22 1930  BP: (!) 139/98  Pulse: (!) 106  Resp: (!) 24  Temp: 98.9 F (37.2 C)  SpO2: 95%     General: Alert and in no acute distress. Eyes:  PERRL. EOMI. Head: Patient with ecchymosis around the right zygomatic region.  No open wounds.  No other visible signs of trauma to the head or face.  Tender over this area without palpable abnormality or crepitus.  No other tenderness to  palpation.  No battle signs, raccoon eyes, serosanguineous fluid drainage from the ears or nares.  Neck: No stridor. No cervical spine tenderness to palpation. Cardiovascular:  Good peripheral perfusion Respiratory: Normal respiratory effort without tachypnea or retractions. Lungs CTAB. Musculoskeletal: Full range of motion to all extremities.  Neurologic:  No gross focal neurologic deficits are appreciated.  Cranial nerves II through XII grossly intact. Skin:   No rash noted Other:   ED Results / Procedures / Treatments   Labs (all labs ordered are listed, but only abnormal results are displayed) Labs Reviewed - No data to display   EKG     RADIOLOGY  I personally viewed, evaluated, and interpreted these images as part of my medical decision making, as well as reviewing the written report by the radiologist.  ED Provider Interpretation: No evidence of intracranial hemorrhage, skull fracture, facial fracture or cervical spine fracture.  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  CT Cervical Spine Wo Contrast  Result Date: 11/08/2022 CLINICAL DATA:  Status post fall. EXAM: CT CERVICAL SPINE WITHOUT CONTRAST TECHNIQUE: Multidetector CT  imaging of the cervical spine was performed without intravenous contrast. Multiplanar CT image reconstructions were also generated. RADIATION DOSE REDUCTION: This exam was performed according to the departmental dose-optimization program which includes automated exposure control, adjustment of the mA and/or kV according to patient size and/or use of iterative reconstruction technique. COMPARISON:  None Available. FINDINGS: Alignment: Normal. Skull base and vertebrae: No acute fracture. No primary bone lesion or focal pathologic process. Soft tissues and spinal canal: No prevertebral fluid or swelling. No visible canal hematoma. Disc levels: Mild anterior osteophyte formation and posterior bony spurring are seen at the level of C5-C6.  There is moderate severity narrowing of the anterior atlantoaxial articulation. Mild multilevel intervertebral disc space narrowing is noted. Mild, bilateral multilevel facet joint hypertrophy is noted. Upper chest: Emphysematous lung disease is seen within the bilateral upper lobes. Other: None. IMPRESSION: 1. No acute fracture or subluxation in the cervical spine. 2. Mild degenerative changes at the level of C5-C6. 3. Emphysematous lung disease. Emphysema (ICD10-J43.9). Electronically Signed   By: Virgina Norfolk M.D.   On: 11/08/2022 20:54   CT Maxillofacial Wo Contrast  Result Date: 11/08/2022 CLINICAL DATA:  Status post fall. EXAM: CT MAXILLOFACIAL WITHOUT CONTRAST TECHNIQUE: Multidetector CT imaging of the maxillofacial structures was performed. Multiplanar CT image reconstructions were also generated. RADIATION DOSE REDUCTION: This exam was performed according to the departmental dose-optimization program which includes automated exposure control, adjustment of the mA and/or kV according to patient size and/or use of iterative reconstruction technique. COMPARISON:  None Available. FINDINGS: Osseous: A chronic appearing right-sided nasal bone fracture is seen. Orbits: A small amount air is seen along the anterior aspects of the right and left globes. The globes are intact. Mild, lateral extraconal soft tissue swelling is seen, bilaterally. Sinuses: Clear. Soft tissues: There is moderate to marked severity right-sided facial and lateral right periorbital soft tissue swelling. Limited intracranial: No significant or unexpected finding. IMPRESSION: 1. Right-sided facial and lateral right periorbital soft tissue swelling, without evidence of an acute fracture. 2. Mild amount of bilateral orbital emphysema and lateral extraconal soft tissue swelling. Ophthalmology consult is recommended. Electronically Signed   By: Virgina Norfolk M.D.   On: 11/08/2022 20:52   CT Head Wo Contrast  Result Date:  11/08/2022 CLINICAL DATA:  Status post fall. EXAM: CT HEAD WITHOUT CONTRAST TECHNIQUE: Contiguous axial images were obtained from the base of the skull through the vertex without intravenous contrast. RADIATION DOSE REDUCTION: This exam was performed according to the departmental dose-optimization program which includes automated exposure control, adjustment of the mA and/or kV according to patient size and/or use of iterative reconstruction technique. COMPARISON:  None Available. FINDINGS: Brain: There is mild cerebral atrophy with widening of the extra-axial spaces and ventricular dilatation. There are areas of decreased attenuation within the white matter tracts of the supratentorial brain, consistent with microvascular disease changes. Vascular: No hyperdense vessel or unexpected calcification. Skull: Normal. Negative for fracture or focal lesion. Sinuses/Orbits: A mild amount of air attenuation is seen along the anterior aspect of the right and left globes, right slightly greater than left. Other: There is moderate to marked severity right-sided facial and lateral right periorbital soft tissue swelling. IMPRESSION: 1. Moderate to marked severity right-sided facial and lateral right periorbital soft tissue swelling without evidence of an acute fracture or acute intracranial abnormality. 2. Mild amount of air attenuation along the anterior aspect of the right and left lobes, right slightly greater than left. While this may be iatrogenic in nature, correlation with  the patient's physical examination is recommended to exclude the presence of an orbital fracture. 3. Generalized cerebral atrophy with widening of the extra-axial spaces and ventricular dilatation. 4. 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 Electronically Signed   By: Virgina Norfolk M.D.   On: 11/08/2022 20:43    PROCEDURES:  Critical Care performed: No  Procedures   MEDICATIONS ORDERED IN ED: Medications - No  data to display   IMPRESSION / MDM / [place] / ED COURSE  I reviewed the triage vital signs and the nursing notes.                                 Differential diagnosis includes, but is not limited to, facial fracture, skull fracture, intracranial hemorrhage, cervical spine fracture  Patient's presentation is most consistent with acute presentation with potential threat to life or bodily function.   Patient's diagnosis is consistent with fall, facial hematoma, orbital emphysema.  Patient presents emergency department after mechanical fall.  Patient is a known alcoholic and known to this department.  Fall was mechanical in nature with no loss of consciousness.  Patient is acting his baseline.  Patient did have a large amount of edema about the right zygomatic region and CT scans of the head, face and cervical spine were ordered.  No evidence of intracranial hemorrhage, skull fracture, facial fracture or cervical spine fracture.  However there is orbital emphysema which is most consistent with a very small nondisplaced sinus fracture.  It was recommended that we talk to ophthalmology for this finding I reached out to on-call ophthalmologist, Dr. George Ina.  Recommendation is to place patient on antibiotic given the high likelihood of sinus fracture, however there is no other treatments deemed necessary at this time.  He will follow-up with ophthalmology to ensure no further complications.  Return precautions discussed with patient and family.  No indication for further workup at this time.  Patient is given ED precautions to return to the ED for any worsening or new symptoms.     FINAL CLINICAL IMPRESSION(S) / ED DIAGNOSES   Final diagnoses:  Fall, initial encounter  Facial hematoma, initial encounter  Traumatic subcutaneous emphysema, initial encounter [facility])     Rx / DC Orders   ED Discharge Orders          Ordered    amoxicillin-clavulanate (AUGMENTIN) 875-125 MG tablet   2 times daily        11/08/22 2123             Note:  This document was prepared using Dragon voice recognition software and may include unintentional dictation errors.   Brynda Peon 11/08/22 2123    Nathaniel Man, MD 11/09/22 1236

## 2022-11-08 NOTE — ED Triage Notes (Signed)
FIRST NURSE NOTE:  Pt arrived via [facility] from home with reports of fall, pt has hematoma to R eye, pt has been using etoh, witnessed fall no LOC.  180/100 P 130 ST Cbg 103 98  Had 20G access with EMS but pulled out prior to arrival.

## 2022-11-30 ENCOUNTER — Ambulatory Visit: Payer: Self-pay | Admitting: *Deleted

## 2022-11-30 NOTE — Telephone Encounter (Signed)
Chief Complaint: dark colored stools. Decrease balance  Symptoms: c/o black stools. Took Pepto- Bismol yesterday for stomach upset. C/o middle abdomen pain comes and goes. Balance "off" per daughter . reports fall last week Sunday due to balance issues.  Frequency: dark colored stool today decrease balance last week  Pertinent Negatives: Patient denies chest pain no difficulty breathing, no fever, no weakness on either side of body. No blurred vision no dizziness now  Disposition: '[]'$ ED /'[]'$ Urgent Care (no appt availability in office) / '[x]'$ Appointment(In office/virtual)/ '[]'$  [facility]/ '[]'$ Home Care/ '[]'$ Refused Recommended Disposition /'[]'$ [place] [facility]/ '[]'$  Follow-up with PCP Additional Notes:   Appt scheduled for 12/01/22 in am . Recommended if worsening abdominal pain , balance issues worsen need to hold something to walk go to ED or call 911. Patient s' daughter verbalized understanding.     Reason for Disposition  [1] MODERATE pain (e.g., interferes with normal activities) AND [2] pain comes and goes (cramps) AND [3] present > 24 hours  (Exception: Pain with Vomiting or Diarrhea - see that Guideline.)  [1] MODERATE dizziness (e.g., interferes with normal activities) AND [2] has NOT been evaluated by doctor (or NP/PA) for this  (Exception: Dizziness caused by heat exposure, sudden standing, or poor fluid intake.)  Answer Assessment - Initial Assessment Questions 1. LOCATION: "Where does it hurt?"      Middle of abdomen  2. RADIATION: "Does the pain shoot anywhere else?" (e.g., chest, back)     No  3. ONSET: "When did the pain begin?" (Minutes, hours or days ago)      Yesterday  4. SUDDEN: "Gradual or sudden onset?"     Every now and then  5. PATTERN "Does the pain come and go, or is it constant?"    - If it comes and goes: "How long does it last?" "Do you have pain now?"     (Note: Comes and goes means the pain is intermittent. It goes away completely between bouts.)     - If constant: "Is it getting better, staying the same, or getting worse?"      (Note: Constant means the pain never goes away completely; most serious pain is constant and gets worse.)      Comes and  goes  6. SEVERITY: "How bad is the pain?"  (e.g., Scale 1-10; mild, moderate, or severe)    - MILD (1-3): Doesn't interfere with normal activities, abdomen soft and not tender to touch.     - MODERATE (4-7): Interferes with normal activities or awakens from sleep, abdomen tender to touch.     - SEVERE (8-10): Excruciating pain, doubled over, unable to do any normal activities.       Does not interfere with sleep  7. RECURRENT SYMPTOM: "Have you ever had this type of stomach pain before?" If Yes, ask: "When was the last time?" and "What happened that time?"      na 8. CAUSE: "What do you think is causing the stomach pain?"     na 9. RELIEVING/AGGRAVATING FACTORS: "What makes it better or worse?" (e.g., antacids, bending or twisting motion, bowel movement)     Took pepto bismol with little relief 10. OTHER SYMPTOMS: "Do you have any other symptoms?" (e.g., back pain, diarrhea, fever, urination pain, vomiting)       Middle abdominal pain stool black  Answer Assessment - Initial Assessment Questions 1. DESCRIPTION: "Describe your dizziness."     Balance off  2. LIGHTHEADED: "Do you feel lightheaded?" (e.g., somewhat  faint, woozy, weak upon standing)     na 3. VERTIGO: "Do you feel like either you or the room is spinning or tilting?" (i.e. vertigo)     na 4. SEVERITY: "How bad is it?"  "Do you feel like you are going to faint?" "Can you stand and walk?"   - MILD: Feels slightly dizzy, but walking normally.   - MODERATE: Feels unsteady when walking, but not falling; interferes with normal activities (e.g., school, work).   - SEVERE: Unable to walk without falling, or requires assistance to walk without falling; feels like passing out now.      "Balance off " per daughter on [place]. [place] last week   5. ONSET:  "When did the dizziness begin?"     Na  6. AGGRAVATING FACTORS: "Does anything make it worse?" (e.g., standing, change in head position)     Na  7. HEART RATE: "Can you tell me your heart rate?" "How many beats in 15 seconds?"  (Note: not all patients can do this)       na 8. CAUSE: "What do you think is causing the dizziness?"     Not sure  9. RECURRENT SYMPTOM: "Have you had dizziness before?" If Yes, ask: "When was the last time?" "What happened that time?"     na 10. OTHER SYMPTOMS: "Do you have any other symptoms?" (e.g., fever, chest pain, vomiting, diarrhea, bleeding)       Abdominal pain black stools  11. PREGNANCY: "Is there any chance you are pregnant?" "When was your last menstrual period?"       na  Protocols used: Abdominal Pain - Male-A-AH, Dizziness - Lightheadedness-A-AH

## 2022-11-30 NOTE — Telephone Encounter (Signed)
Noted  

## 2022-11-30 NOTE — Progress Notes (Signed)
I,Sulibeya S Dimas,acting as a Education administrator for [name], MD.,have documented all relevant documentation on the behalf of [name], MD,as directed by  [name], MD while in the presence of [name], MD.   Established patient visit   Patient: Mark Carr   DOB: 1956/02/16   67 y.o. Male  MRN: [id number] Visit Date: 12/01/2022  Today's healthcare provider: [name], MD   Chief Complaint  Patient presents with   Abdominal Pain   Subjective    Abdominal Pain This is a new problem. The current episode started in the past 7 days. The onset quality is sudden. The problem has been resolved. The pain is located in the generalized abdominal region. Associated symptoms include diarrhea. Treatments tried: pepto bismol. The treatment provided significant relief.    Patient C/O being off balance worse some days. Patient reports falling about 1 month at home.   Medications: Outpatient Medications Prior to Visit  Medication Sig   amLODipine (NORVASC) 5 MG tablet TAKE ONE TABLET BY MOUTH DAILY   CLENPIQ 10-3.5-12 MG-GM -GM/175ML SOLN Take by mouth.   famotidine (PEPCID) 20 MG tablet Take 1 tablet (20 mg total) by mouth daily.   labetalol (NORMODYNE) 200 MG tablet TAKE ONE TABLET BY MOUTH TWICE A DAY   Multiple Vitamin (MULTIVITAMIN WITH MINERALS) TABS tablet Take 1 tablet by mouth daily.   naltrexone (DEPADE) 50 MG tablet Take 1 tablet (50 mg total) by mouth daily.   thiamine 100 MG tablet Take 1 tablet (100 mg total) by mouth daily.   No facility-administered medications prior to visit.    Review of Systems  Gastrointestinal:  Positive for abdominal pain and diarrhea.       Objective    BP 132/85 (BP Location: Right Arm, Patient Position: Sitting, Cuff Size: Normal)   Pulse (!) 111   Temp 98.4 F (36.9 C) (Temporal)   Resp 16   Wt 123 lb (55.8 kg)   BMI 18.70 kg/m    Physical Exam Vitals reviewed.  Constitutional:      General: He  is not in acute distress.    Appearance: Normal appearance. He is not diaphoretic.  HENT:     Head: Normocephalic and atraumatic.  Eyes:     General: No scleral icterus.    Conjunctiva/sclera: Conjunctivae normal.  Cardiovascular:     Rate and Rhythm: Regular rhythm. Tachycardia present.     Pulses: Normal pulses.     Heart sounds: Normal heart sounds. No murmur heard. Pulmonary:     Effort: Pulmonary effort is normal. No respiratory distress.     Breath sounds: Normal breath sounds. No wheezing or rhonchi.  Abdominal:     General: Abdomen is flat. Bowel sounds are normal.     Palpations: Abdomen is soft. There is no mass.     Tenderness: There is no abdominal tenderness.  Musculoskeletal:     Cervical back: Neck supple.     Right lower leg: No edema.     Left lower leg: No edema.  Lymphadenopathy:     Cervical: No cervical adenopathy.  Skin:    General: Skin is warm and dry.     Findings: No rash.  Neurological:     Mental Status: He is alert and oriented to person, place, and time. Mental status is at baseline.  Psychiatric:        Mood and Affect: Mood normal.        Behavior: Behavior normal.  No results found for any visits on 12/01/22.  Assessment & Plan     Problem List Items Addressed This Visit       Digestive   Alcoholic cirrhosis of liver without ascites ([place]) - Primary    Chronic and stable F/b GI Advised on importance of cutting back on alcohol      Relevant Orders   CBC     Hematopoietic and Hemostatic   Thrombocytopenia ([place])    Chronic and stable Recheck today in setting of recent dark stool 2/2 alcohol and cirrhosis      Relevant Orders   CBC     Other   Alcohol use disorder, moderate, dependence ([place])    Encourage taking naltrexone regularly Encourage to cut back slowly to avoid withdrawal symptoms      Frequent falls    Last fall <1 month ago Is going to schedule f/u with Optho as recommended by ED Feeling imbalanced  still Will send [facility] PT to evaluate and strengthen      Relevant Orders   Ambulatory referral to [place]   Generally unsteady   Relevant Orders   Ambulatory referral to [place] stools    One episode of dark stools with no blood seen after taking pepto bismol Discussed that this may be 2/2 pepto bismol Abd pain and diarrhea preceded pepto bismol use and have also resolved Check CBC to ensure no blood loss, esp with known cirrhosis and thrombocytopenia      Relevant Orders   CBC     No follow-ups on file.      I, [name], MD, have reviewed all documentation for this visit. The documentation on 12/01/22 for the exam, diagnosis, procedures, and orders are all accurate and complete.   Augustino Savastano, Dionne Bucy, MD, MPH [facility]

## 2022-12-01 ENCOUNTER — Encounter: Payer: Self-pay | Admitting: Family Medicine

## 2022-12-01 ENCOUNTER — Ambulatory Visit (INDEPENDENT_AMBULATORY_CARE_PROVIDER_SITE_OTHER): Payer: Medicare Other | Admitting: Family Medicine

## 2022-12-01 VITALS — BP 132/85 | HR 111 | Temp 98.4°F | Resp 16 | Wt 123.0 lb

## 2022-12-01 DIAGNOSIS — R2681 Unsteadiness on feet: Secondary | ICD-10-CM | POA: Diagnosis not present

## 2022-12-01 DIAGNOSIS — R296 Repeated falls: Secondary | ICD-10-CM

## 2022-12-01 DIAGNOSIS — K7030 Alcoholic cirrhosis of liver without ascites: Secondary | ICD-10-CM | POA: Diagnosis not present

## 2022-12-01 DIAGNOSIS — D696 Thrombocytopenia, unspecified: Secondary | ICD-10-CM

## 2022-12-01 DIAGNOSIS — R195 Other fecal abnormalities: Secondary | ICD-10-CM | POA: Insufficient documentation

## 2022-12-01 DIAGNOSIS — F1020 Alcohol dependence, uncomplicated: Secondary | ICD-10-CM

## 2022-12-01 NOTE — Assessment & Plan Note (Signed)
One episode of dark stools with no blood seen after taking pepto bismol Discussed that this may be 2/2 pepto bismol Abd pain and diarrhea preceded pepto bismol use and have also resolved Check CBC to ensure no blood loss, esp with known cirrhosis and thrombocytopenia

## 2022-12-01 NOTE — Assessment & Plan Note (Signed)
Chronic and stable F/b GI Advised on importance of cutting back on alcohol

## 2022-12-01 NOTE — Assessment & Plan Note (Signed)
Last fall <1 month ago Is going to schedule f/u with Optho as recommended by ED Feeling imbalanced still Will send [facility] PT to evaluate and strengthen

## 2022-12-01 NOTE — Assessment & Plan Note (Signed)
Encourage taking naltrexone regularly Encourage to cut back slowly to avoid withdrawal symptoms

## 2022-12-01 NOTE — Assessment & Plan Note (Signed)
Chronic and stable Recheck today in setting of recent dark stool 2/2 alcohol and cirrhosis

## 2022-12-01 NOTE — Addendum Note (Signed)
Addended by: Virginia Crews on: 12/01/2022 01:04 PM   Modules accepted: Orders

## 2022-12-02 ENCOUNTER — Telehealth: Payer: Self-pay

## 2022-12-02 DIAGNOSIS — R296 Repeated falls: Secondary | ICD-10-CM

## 2022-12-02 LAB — CBC
Hematocrit: 38.5 % (ref 37.5–51.0)
Hemoglobin: 13.6 g/dL (ref 13.0–17.7)
MCH: 35.9 pg — ABNORMAL HIGH (ref 26.6–33.0)
MCHC: 35.3 g/dL (ref 31.5–35.7)
MCV: 102 fL — ABNORMAL HIGH (ref 79–97)
Platelets: 73 10*3/uL — CL (ref 150–450)
RBC: 3.79 x10E6/uL — ABNORMAL LOW (ref 4.14–5.80)
RDW: 12.8 % (ref 11.6–15.4)
WBC: 3.7 10*3/uL (ref 3.4–10.8)

## 2022-12-02 NOTE — Telephone Encounter (Signed)
Copied from [place] [id number]. Topic: Quick Communication - Home Health Verbal Orders >> Dec 02, 2022 11:49 AM Everette C wrote: Caller/Agency: [facility] Number: [phone] Requesting OT/PT/Skilled Nursing/Social Work/Speech Therapy: PT  Patient was a non admit for physical therapy and showed no issue with physical function at the time of their evaluation conducted today 12/02/22 at 11:30 AM

## 2022-12-03 NOTE — Telephone Encounter (Signed)
Ok. Can we see if him/family would like to do in person PT instead of HH? I got a referral message that if balance issues are related to drinking, insurance wont cover it. He was not drunk when I saw him and he was unsteady. He has h/o falls with injury.

## 2022-12-03 NOTE — Telephone Encounter (Signed)
NA. LMTCB

## 2022-12-09 NOTE — Telephone Encounter (Signed)
Patient agreed to in person PT. Referral placed.

## 2022-12-14 ENCOUNTER — Encounter: Payer: Self-pay | Admitting: Family Medicine

## 2022-12-14 DIAGNOSIS — F1020 Alcohol dependence, uncomplicated: Secondary | ICD-10-CM

## 2022-12-14 DIAGNOSIS — R296 Repeated falls: Secondary | ICD-10-CM

## 2022-12-14 DIAGNOSIS — K7030 Alcoholic cirrhosis of liver without ascites: Secondary | ICD-10-CM

## 2022-12-14 DIAGNOSIS — R413 Other amnesia: Secondary | ICD-10-CM

## 2022-12-15 ENCOUNTER — Telehealth: Payer: Self-pay | Admitting: *Deleted

## 2022-12-15 NOTE — Progress Notes (Signed)
  Care Coordination   Note   12/15/2022 Name: Mark Carr MRN: [id number] DOB: [birth year]  Mark Carr is a 67 y.o. year old male who sees Bacigalupo, Dionne Bucy, MD for primary care. I reached out to [name] by phone today to schedule in response to a referral  Follow up plan:  Telephone appointment with clinician per referral scheduled   Encounter Outcome:  Pt. Scheduled  Julian Hy, [place] Direct Dial: [phone]

## 2022-12-21 ENCOUNTER — Telehealth: Payer: Self-pay | Admitting: *Deleted

## 2022-12-21 ENCOUNTER — Encounter: Payer: Self-pay | Admitting: *Deleted

## 2022-12-21 NOTE — Patient Outreach (Signed)
  Care Coordination   12/21/2022 Name: SAUL FABIANO MRN: [id number] DOB: 16-Aug-1956   Care Coordination Outreach Attempts:  An unsuccessful telephone outreach was attempted for a scheduled appointment today.  Follow Up Plan:  Additional outreach attempts will be made to offer the patient care coordination information and services.   Encounter Outcome:  No Answer   Care Coordination Interventions:  No, not indicated    Traniyah Hallett, [place] Worker  [facility] Care Management [phone]

## 2022-12-23 ENCOUNTER — Encounter: Payer: Self-pay | Admitting: Family Medicine

## 2022-12-23 NOTE — Patient Instructions (Addendum)
Visit Information  Thank you for taking time to visit with me today. Please don't hesitate to contact me if I can be of assistance to you.   Following are the goals we discussed today:   Goals Addressed             This Visit's Progress    Substance abuse treatment options       Care Coordination Interventions: Substance abuse treatment options discussed-detox vs. Outpatient substance abuse treatment Positive reinforcement for safety modifications to maintain safety in the home-extra locks on doors Personal care services discussed as a possibility due to increased personal care needs Solution-Focused Strategies employed:  Active listening / Reflection utilized  Emotional Support Provided Caregiver stress acknowledged          Our next appointment is by telephone on 12/31/22 at 10am  Please call the care guide team at [phone] if you need to cancel or reschedule your appointment.   If you are experiencing a Mental Health or [place] or need someone to talk to, please call 911   Patient verbalizes understanding of instructions and care plan provided today and agrees to view in [place]. Active MyChart status and patient understanding of how to access instructions and care plan via MyChart confirmed with patient.     Telephone follow up appointment with care management team member scheduled for: 12/31/22  Elliot Gurney, [place] Worker  [facility] Care Management [phone]

## 2022-12-23 NOTE — Patient Outreach (Signed)
  Care Coordination   Initial Visit Note   12/23/2022 Name: Mark Carr MRN: [id number] DOB: 01/30/1956 [place] Mark Carr is a 67 y.o. year old male who sees Bacigalupo, Dionne Bucy, MD for primary care. I spoke with  [name] spouse and daughter by phone on 12/21/22.  What matters to the patients health and wellness today?  Patient's family would like patient to receive substance abuse treatment, however patient extremely reluctant.   Goals Addressed             This Visit's Progress    Substance abuse treatment options       Care Coordination Interventions: Substance abuse treatment options discussed-detox vs. Outpatient substance abuse treatment Positive reinforcement for safety modifications to maintain safety in the home-extra locks on doors Personal care services discussed as a possibility due to increased personal care needs Solution-Focused Strategies employed:  Active listening / Reflection utilized  Emotional Support Provided Caregiver stress acknowledged          SDOH assessments and interventions completed:  Yes  SDOH Interventions Today    Flowsheet Row Most Recent Value  SDOH Interventions   Food Insecurity Interventions Intervention Not Indicated  Housing Interventions Intervention Not Indicated  Transportation Interventions Intervention Not Indicated        Care Coordination Interventions:  Yes, provided   Interventions Today    Flowsheet Row Most Recent Value  Chronic Disease   Chronic disease during today's visit --  [alcoholic cirrhosis of the liver]  General Interventions   General Interventions Discussed/Reviewed General Interventions Discussed, Community Resources, Level of Care  Level of Care Personal Care Services  Mental Health Interventions   Mental Health Discussed/Reviewed Substance Abuse  Safety Interventions   Safety Discussed/Reviewed Fall Risk, Safety Discussed, Home Safety  Home Safety Assistive Devices  [extra  locks on doors]        Follow up plan: Follow up call scheduled for 12/30/22    Encounter Outcome:  Pt. Visit Completed

## 2022-12-24 NOTE — Telephone Encounter (Signed)
Can you facilitate setting up this appt please?

## 2022-12-28 ENCOUNTER — Emergency Department
Admission: EM | Admit: 2022-12-28 | Discharge: 2022-12-28 | Disposition: A | Discharge status: Home / Self Care | Payer: Medicare Other | Attending: Emergency Medicine | Admitting: Emergency Medicine

## 2022-12-28 ENCOUNTER — Emergency Department: Payer: Medicare Other

## 2022-12-28 ENCOUNTER — Other Ambulatory Visit: Payer: Self-pay

## 2022-12-28 DIAGNOSIS — F10220 Alcohol dependence with intoxication, uncomplicated: Secondary | ICD-10-CM | POA: Insufficient documentation

## 2022-12-28 DIAGNOSIS — E871 Hypo-osmolality and hyponatremia: Secondary | ICD-10-CM | POA: Diagnosis not present

## 2022-12-28 DIAGNOSIS — R1084 Generalized abdominal pain: Secondary | ICD-10-CM | POA: Diagnosis present

## 2022-12-28 DIAGNOSIS — E8729 Other acidosis: Secondary | ICD-10-CM | POA: Diagnosis not present

## 2022-12-28 DIAGNOSIS — F10920 Alcohol use, unspecified with intoxication, uncomplicated: Secondary | ICD-10-CM

## 2022-12-28 DIAGNOSIS — I10 Essential (primary) hypertension: Secondary | ICD-10-CM | POA: Insufficient documentation

## 2022-12-28 DIAGNOSIS — I8500 Esophageal varices without bleeding: Secondary | ICD-10-CM | POA: Insufficient documentation

## 2022-12-28 DIAGNOSIS — R410 Disorientation, unspecified: Secondary | ICD-10-CM | POA: Insufficient documentation

## 2022-12-28 DIAGNOSIS — F10288 Alcohol dependence with other alcohol-induced disorder: Secondary | ICD-10-CM | POA: Diagnosis not present

## 2022-12-28 DIAGNOSIS — Y908 Blood alcohol level of 240 mg/100 ml or more: Secondary | ICD-10-CM | POA: Diagnosis not present

## 2022-12-28 LAB — URINALYSIS, ROUTINE W REFLEX MICROSCOPIC
Bilirubin Urine: NEGATIVE
Glucose, UA: NEGATIVE mg/dL
Hgb urine dipstick: NEGATIVE
Ketones, ur: 20 mg/dL — AB
Leukocytes,Ua: NEGATIVE
Nitrite: NEGATIVE
Protein, ur: NEGATIVE mg/dL
Specific Gravity, Urine: 1.011 (ref 1.005–1.030)
pH: 5 (ref 5.0–8.0)

## 2022-12-28 LAB — BASIC METABOLIC PANEL
Anion gap: 16 — ABNORMAL HIGH (ref 5–15)
BUN: 7 mg/dL — ABNORMAL LOW (ref 8–23)
CO2: 19 mmol/L — ABNORMAL LOW (ref 22–32)
Calcium: 7.4 mg/dL — ABNORMAL LOW (ref 8.9–10.3)
Chloride: 95 mmol/L — ABNORMAL LOW (ref 98–111)
Creatinine, Ser: 0.78 mg/dL (ref 0.61–1.24)
GFR, Estimated: >60 mL/min (ref >60)
Glucose, Bld: 294 mg/dL — ABNORMAL HIGH (ref 70–99)
Potassium: 3.6 mmol/L (ref 3.5–5.1)
Sodium: 130 mmol/L — ABNORMAL LOW (ref 135–145)

## 2022-12-28 LAB — CBC WITH DIFFERENTIAL/PLATELET
Abs Immature Granulocytes: 0.04 10*3/uL (ref 0.00–0.07)
Basophils Absolute: 0 10*3/uL (ref 0.0–0.1)
Basophils Relative: 1 %
Eosinophils Absolute: 0 10*3/uL (ref 0.0–0.5)
Eosinophils Relative: 0 %
HCT: 30.9 % — ABNORMAL LOW (ref 39.0–52.0)
Hemoglobin: 11.1 g/dL — ABNORMAL LOW (ref 13.0–17.0)
Immature Granulocytes: 1 %
Lymphocytes Relative: 22 %
Lymphs Abs: 0.6 10*3/uL — ABNORMAL LOW (ref 0.7–4.0)
MCH: 36 pg — ABNORMAL HIGH (ref 26.0–34.0)
MCHC: 35.9 g/dL (ref 30.0–36.0)
MCV: 100.3 fL — ABNORMAL HIGH (ref 80.0–100.0)
Monocytes Absolute: 0.2 10*3/uL (ref 0.1–1.0)
Monocytes Relative: 7 %
Neutro Abs: 2 10*3/uL (ref 1.7–7.7)
Neutrophils Relative %: 69 %
Platelets: 67 10*3/uL — ABNORMAL LOW (ref 150–400)
RBC: 3.08 MIL/uL — ABNORMAL LOW (ref 4.22–5.81)
RDW: 13.4 % (ref 11.5–15.5)
WBC: 2.8 10*3/uL — ABNORMAL LOW (ref 4.0–10.5)
nRBC: 0 % (ref 0.0–0.2)

## 2022-12-28 LAB — COMPREHENSIVE METABOLIC PANEL
ALT: 79 U/L — ABNORMAL HIGH (ref 0–44)
AST: 292 U/L — ABNORMAL HIGH (ref 15–41)
Albumin: 3.7 g/dL (ref 3.5–5.0)
Alkaline Phosphatase: 95 U/L (ref 38–126)
Anion gap: 20 — ABNORMAL HIGH (ref 5–15)
BUN: 9 mg/dL (ref 8–23)
CO2: 18 mmol/L — ABNORMAL LOW (ref 22–32)
Calcium: 8.4 mg/dL — ABNORMAL LOW (ref 8.9–10.3)
Chloride: 88 mmol/L — ABNORMAL LOW (ref 98–111)
Creatinine, Ser: 0.82 mg/dL (ref 0.61–1.24)
GFR, Estimated: >60 mL/min (ref >60)
Glucose, Bld: 108 mg/dL — ABNORMAL HIGH (ref 70–99)
Potassium: 4 mmol/L (ref 3.5–5.1)
Sodium: 126 mmol/L — ABNORMAL LOW (ref 135–145)
Total Bilirubin: 4.2 mg/dL — ABNORMAL HIGH (ref 0.3–1.2)
Total Protein: 7.5 g/dL (ref 6.5–8.1)

## 2022-12-28 LAB — URINE DRUG SCREEN, QUALITATIVE (ARMC ONLY)
Amphetamines, Ur Screen: NOT DETECTED
Barbiturates, Ur Screen: NOT DETECTED
Benzodiazepine, Ur Scrn: NOT DETECTED
Cannabinoid 50 Ng, Ur ~~LOC~~: NOT DETECTED
Cocaine Metabolite,Ur ~~LOC~~: NOT DETECTED
MDMA (Ecstasy)Ur Screen: NOT DETECTED
Methadone Scn, Ur: NOT DETECTED
Opiate, Ur Screen: NOT DETECTED
Phencyclidine (PCP) Ur S: NOT DETECTED
Tricyclic, Ur Screen: NOT DETECTED

## 2022-12-28 LAB — CBG MONITORING, ED
Glucose-Capillary: 127 mg/dL — ABNORMAL HIGH (ref 70–99)
Glucose-Capillary: 267 mg/dL — ABNORMAL HIGH (ref 70–99)

## 2022-12-28 LAB — MAGNESIUM: Magnesium: 1.6 mg/dL — ABNORMAL LOW (ref 1.7–2.4)

## 2022-12-28 LAB — LIPASE, BLOOD: Lipase: 36 U/L (ref 11–51)

## 2022-12-28 LAB — ETHANOL: Alcohol, Ethyl (B): 263 mg/dL — ABNORMAL HIGH (ref <10)

## 2022-12-28 MED ORDER — ONDANSETRON HCL 4 MG/2ML IJ SOLN
4.0000 mg | Freq: Once | INTRAMUSCULAR | Status: AC
  Administered 2022-12-28: 4 mg via INTRAVENOUS
  Filled 2022-12-28: qty 2

## 2022-12-28 MED ORDER — DIAZEPAM 5 MG/ML IJ SOLN
10.0000 mg | Freq: Once | INTRAMUSCULAR | Status: AC
  Administered 2022-12-28: 10 mg via INTRAVENOUS
  Filled 2022-12-28: qty 2

## 2022-12-28 MED ORDER — SODIUM CHLORIDE 0.9 % IV BOLUS
1000.0000 mL | Freq: Once | INTRAVENOUS | Status: AC
  Administered 2022-12-28: 1000 mL via INTRAVENOUS

## 2022-12-28 MED ORDER — THIAMINE HCL 100 MG PO TABS: 100.0000 mg | ORAL_TABLET | Freq: Every day | ORAL | 3 refills | Status: DC

## 2022-12-28 MED ORDER — IOHEXOL 300 MG/ML  SOLN
100.0000 mL | Freq: Once | INTRAMUSCULAR | Status: AC | PRN
  Administered 2022-12-28: 100 mL via INTRAVENOUS

## 2022-12-28 MED ORDER — MAGNESIUM SULFATE 2 GM/50ML IV SOLN
2.0000 g | Freq: Once | INTRAVENOUS | Status: AC
  Administered 2022-12-28: 2 g via INTRAVENOUS
  Filled 2022-12-28: qty 50

## 2022-12-28 MED ORDER — DEXTROSE 5 % AND 0.9 % NACL IV BOLUS
1000.0000 mL | Freq: Once | INTRAVENOUS | Status: AC
  Administered 2022-12-28: 1000 mL via INTRAVENOUS

## 2022-12-28 MED ORDER — THIAMINE HCL 100 MG/ML IJ SOLN
100.0000 mg | Freq: Once | INTRAMUSCULAR | Status: AC
  Administered 2022-12-28: 100 mg via INTRAVENOUS
  Filled 2022-12-28: qty 2

## 2022-12-28 NOTE — ED Triage Notes (Signed)
Pt BIB EMS for diffuse abd pain, hx ETOH, cirrhosis. EMS reports emesis PTA, pt states last BM yesterday, normal. Per EMS, family states pt is "pleasantly confused." Pt presents w/ hiccups. Pt states he hasn't eaten in 4D.

## 2022-12-28 NOTE — Discharge Instructions (Addendum)
Your blood work indicates that you are not not getting adequate nutrition.  Please start taking the thiamine daily and make sure you are eating 3 meals per day with enough protein.  Your CAT scan does show that you have varices which are dilated blood vessels in your esophagus from your cirrhosis that can be prone to bleeding.  Please follow-up with the gastroenterology doctors.

## 2022-12-28 NOTE — ED Provider Notes (Addendum)
[facility] Provider Note    Event Date/Time   First MD Initiated Contact with Patient 12/28/22 0258     (approximate)   History   Abdominal Pain   HPI  Mark Carr is a 67 y.o. male past medical history of cirrhosis, alcohol use disorder who presents because of vomiting and abdominal pain.  Patient tells me he had diffuse abdominal pains been coming and going today.  Has not been able to eat as a result.  Patient told me no vomiting but per EMS patient had vomiting and there was some concern it could have blood in it.  He had a BM yesterday there is no blood in his stool or black stool.  Denies fevers or chills.  Says he only took a swig of alcohol today.  Typically drinks 40 ounces.  Denies feeling like he is in withdrawal.  Currently has hiccups says he frequently gets the hiccups. Patient was found to be hypoglycemic with EMS blood sugar 49.  Received D10.  Patient is no history of diabetes.  I spoke with patient's wife who tells me that he has been drinking a lot drinks about 4 quarts of beer per day drinking seems to be getting worse over the last week.  Has not been eating much that frequent hiccups but this is not really new for him.  Has had falls but none in the last week.  Did have some vomiting but it looked yellow.  Patient was drinking tonight.      Past Medical History:  Diagnosis Date        Acute encephalopathy 06/16/2021   Cirrhosis of liver ([place])    Coag negative Staphylococcus bacteremia 04/21/2021   Wrist fracture     Patient Active Problem List   Diagnosis Date Noted   Dark stools 12/01/2022   Frequent falls 07/15/2022   Generally unsteady 07/15/2022   Alcohol use disorder, moderate, dependence ([place]) 01/29/2022   Thrombocytopenia ([place]) 01/29/2022   Hypertension 01/29/2022   Memory loss or impairment 07/29/2021   PRES (posterior reversible encephalopathy syndrome) 07/29/2021   Essential hypertension [id number]   Alcoholic  cirrhosis of liver without ascites ([employer]) 06/16/2021   Protein-calorie malnutrition, severe 04/02/2021   Generalized weakness    Tobacco use disorder 05/18/2019   Macrocytosis 05/18/2019   Steatosis of liver 05/18/2019   Chronic fatigue 05/18/2019     Physical Exam  Triage Vital Signs: ED Triage Vitals  Enc Vitals Group     BP 12/28/22 0308 135/76     Pulse Rate 12/28/22 0308 (!) 109     Resp 12/28/22 0308 (!) 26     Temp 12/28/22 0308 98.6 F (37 C)     Temp Source 12/28/22 0308 Oral     SpO2 12/28/22 0303 99 %     Weight 12/28/22 0310 122 lb (55.3 kg)     Height 12/28/22 0310 5' 8"$  (1.727 m)     Head Circumference --      Peak Flow --      Pain Score 12/28/22 0309 0     Pain Loc --      Pain Edu? --      Excl. in [place]? --     Most recent vital signs: Vitals:   12/28/22 0600 12/28/22 0630  BP: 113/69 109/72  Pulse: (!) 112 (!) 125  Resp: 13 18  Temp:    SpO2: 90% 97%     General: Awake, no distress.  CV:  Good  peripheral perfusion.  No lower extremity swelling Resp:  Normal effort.  No increased work of breathing Abd:  No distention.  Abdomen is soft minimal tenderness Neuro:             Awake, Alert, Oriented x 3  Other:  Patient has frequent hiccups, he is fidgeting and frequently moving around in the bed, appears intoxicated   ED Results / Procedures / Treatments  Labs (all labs ordered are listed, but only abnormal results are displayed) Labs Reviewed  COMPREHENSIVE METABOLIC PANEL - Abnormal; Notable for the following components:      Result Value   Sodium 126 (*)    Chloride 88 (*)    CO2 18 (*)    Glucose, Bld 108 (*)    Calcium 8.4 (*)    AST 292 (*)    ALT 79 (*)    Total Bilirubin 4.2 (*)    Anion gap 20 (*)    All other components within normal limits  CBC WITH DIFFERENTIAL/PLATELET - Abnormal; Notable for the following components:   WBC 2.8 (*)    RBC 3.08 (*)    Hemoglobin 11.1 (*)    HCT 30.9 (*)    MCV 100.3 (*)    MCH 36.0 (*)     Platelets 67 (*)    Lymphs Abs 0.6 (*)    All other components within normal limits  ETHANOL - Abnormal; Notable for the following components:   Alcohol, Ethyl (B) 263 (*)    All other components within normal limits  MAGNESIUM - Abnormal; Notable for the following components:   Magnesium 1.6 (*)    All other components within normal limits  URINALYSIS, ROUTINE W REFLEX MICROSCOPIC - Abnormal; Notable for the following components:   Color, Urine YELLOW (*)    APPearance CLEAR (*)    Ketones, ur 20 (*)    All other components within normal limits  BASIC METABOLIC PANEL - Abnormal; Notable for the following components:   Sodium 130 (*)    Chloride 95 (*)    CO2 19 (*)    Glucose, Bld 294 (*)    BUN 7 (*)    Calcium 7.4 (*)    Anion gap 16 (*)    All other components within normal limits  CBG MONITORING, ED - Abnormal; Notable for the following components:   Glucose-Capillary 127 (*)    All other components within normal limits  CBG MONITORING, ED - Abnormal; Notable for the following components:   Glucose-Capillary 267 (*)    All other components within normal limits  LIPASE, BLOOD  URINE DRUG SCREEN, QUALITATIVE ([facility] ONLY)     EKG     RADIOLOGY I reviewed and interpreted the CT scan of the brain which does not show any acute intracranial process    PROCEDURES:  Critical Care performed: No  Procedures  The patient is on the cardiac monitor to evaluate for evidence of arrhythmia and/or significant heart rate changes.   MEDICATIONS ORDERED IN ED: Medications  sodium chloride 0.9 % bolus 1,000 mL (0 mLs Intravenous Stopped 12/28/22 0414)  ondansetron (ZOFRAN) injection 4 mg (4 mg Intravenous Given 12/28/22 0316)  magnesium sulfate IVPB 2 g 50 mL (0 g Intravenous Stopped 12/28/22 0525)  dextrose 5 % and 0.9% NaCl 5-0.9 % bolus 1,000 mL (0 mLs Intravenous Stopped 12/28/22 0552)  thiamine (VITAMIN B1) injection 100 mg (100 mg Intravenous Given 12/28/22 0414)  iohexol  (OMNIPAQUE) 300 MG/ML solution 100 mL (100 mLs Intravenous Contrast  Given 12/28/22 0356)  diazepam (VALIUM) injection 10 mg (10 mg Intravenous Given 12/28/22 0551)     IMPRESSION / MDM / ASSESSMENT AND PLAN / ED COURSE  I reviewed the triage vital signs and the nursing notes.                              Patient's presentation is most consistent with acute presentation with potential threat to life or bodily function.  Differential diagnosis includes, but is not limited to, alcohol intoxication, AKA, altered normality, decompensated cirrhosis, electrolyte abnormality, GI bleed, subdural hemorrhage  Patient is a 67 year old male history of alcohol use disorder and cirrhosis who presents because of abdominal pain vomiting inability tolerate p.o.  Patient tells me he had abdominal pain for about a day and denies vomiting.  Says he only drank a little bit today.  When talking to his wife on the phone apparently he has been drinking frequently over the last week which is not new for him but seems to have intensified he has not been eating and has had frequent hiccups and some retching minimal emesis.  He has been "hollering" about his belly and this is why called ambulance tonight.  She was hypoglycemic with EMS with sugar 49 received some D10 and is 127 on recheck on arrival to ED.  Patient is tachycardic on arrival the rest of his vitals are reassuring.  Patient does appear intoxicated he is pleasantly confused but able to converse.  Abdomen is soft there is no overt tenderness and he is denying pain currently.  Did perform a rectal exam given some report of possible hematemesis patient has brown stool and in talking with his wife the emesis appeared yellow/low suspicion for upper GI bleed.  With patient's hypoglycemia decreased p.o. intake frequent drinking I am concerned for possible AKA dehydration electrode abnormality.  Plan to obtain CT head CT abdomen pelvis labs and rehydrate.  Will give Zofran  for nausea.  Not complaining of pain now so we will hold off on pain medication.  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 bili 4.2.  Alcohol level is 260.  Mag is low at 1.6 will supplement.  Will give thiamine and D5 NS bolus for his AKA.   CT abdomen has no acute findings.  There is severe hepatic steatosis and varices.  Discussed the findings with patient and his daughter who is at bedside.  Patient is adamant that he does not want to stay in the hospital.  Tells me he just be more comfortable at home.  We discussed the risks of death if he continues to drink like he is and he accepts this risk and understands it.  He is willing to stay for repeat BMP and complete his fluids.  He is starting to have some withdrawal symptoms including increasing tremor so we will give 10 of Valium.  Repeat BMP after dextrose shows improving anion gap to 16, sodium is now 130.  Patient continues to want to leave.  Will prescribe thiamine.  Given him GI follow-up.  FINAL CLINICAL IMPRESSION(S) / ED DIAGNOSES   Final diagnoses:  Alcoholic intoxication without complication (HCC)  Alcoholic ketoacidosis  Esophageal varices without bleeding, unspecified esophageal varices type ([place])     Rx / DC Orders   ED Discharge Orders  Ordered    thiamine (VITAMIN B1) 100 MG tablet  Daily        12/28/22 [id number]             Note:  This document was prepared using Dragon voice recognition software and may include unintentional dictation errors.   Rada Hay, MD 12/28/22 [id number]    Rada Hay, MD 12/28/22 [phone]

## 2022-12-28 NOTE — Progress Notes (Unsigned)
I,Sulibeya S Dimas,acting as a Education administrator for [name], MD.,have documented all relevant documentation on the behalf of [name], MD,as directed by  [name], MD while in the presence of [name], MD.   MyChart Video Visit    Virtual Visit via Video Note   This format is felt to be most appropriate for this patient at this time. Physical exam was limited by quality of the video and audio technology used for the visit.    Patient location: home Provider location: [place] involved in the visit: patient, provider, patient's daughter, patient's wife  I discussed the limitations of evaluation and management by telemedicine and the availability of in person appointments. The patient expressed understanding and agreed to proceed.  Patient: Mark Carr   DOB: [birth year]   67 y.o. Male  MRN: [id number] Visit Date: 12/29/2022  Today's healthcare provider: [name], MD   Chief Complaint  Patient presents with   Hospitalization Follow-up   Subjective    HPI  History provided by wife Mark Carr) and daughter Mark Carr) DPR checked.  Follow up ER visit  Patient was seen in ER for alcoholic intoxication on 0000000. Treatment for this included thiamine and D5 NS bolus for his AKA .10 mg of Valium intravenous.  He reports excellent compliance with treatment. He reports this condition is Unchanged.  -----------------------------------------------------------------------------------------  Requesting hospital bed for home. Patient fell while getting in bed last night.   Reports he is cutting back some on alcohol intake.  LCSW was reaching out with resources for alcohol rehab.  Medications: Outpatient Medications Prior to Visit  Medication Sig   amLODipine (NORVASC) 5 MG tablet TAKE ONE TABLET BY MOUTH DAILY   CLENPIQ 10-3.5-12 MG-GM -GM/175ML SOLN Take by mouth.   famotidine (PEPCID) 20 MG tablet Take 1 tablet (20 mg  total) by mouth daily. (Patient taking differently: Take 20 mg by mouth as needed.)   labetalol (NORMODYNE) 200 MG tablet TAKE ONE TABLET BY MOUTH TWICE A DAY   Multiple Vitamin (MULTIVITAMIN WITH MINERALS) TABS tablet Take 1 tablet by mouth daily.   naltrexone (DEPADE) 50 MG tablet Take 1 tablet (50 mg total) by mouth daily.   thiamine 100 MG tablet Take 1 tablet (100 mg total) by mouth daily.   [DISCONTINUED] thiamine (VITAMIN B1) 100 MG tablet Take 1 tablet (100 mg total) by mouth daily. (Patient not taking: Reported on 12/29/2022)   No facility-administered medications prior to visit.    Review of Systems  Constitutional:  Negative for appetite change, chills and fever.  Respiratory:  Positive for cough.   Cardiovascular:  Negative for leg swelling.  Gastrointestinal:  Negative for abdominal distention, abdominal pain, nausea and vomiting.  Musculoskeletal:  Positive for back pain.  Neurological:  Positive for weakness.  Psychiatric/Behavioral:  Positive for agitation, behavioral problems, decreased concentration and dysphoric mood.        Objective    BP 101/69 (BP Location: Left Arm, Patient Position: Sitting, Cuff Size: Normal)   Pulse (!) 120   Wt 122 lb (55.3 kg)   BMI 18.55 kg/m      Physical Exam Constitutional:      General: He is not in acute distress.    Appearance: Normal appearance. He is not diaphoretic.  HENT:     Head: Normocephalic.  Eyes:     Conjunctiva/sclera: Conjunctivae normal.  Pulmonary:     Effort: Pulmonary effort is normal. No respiratory distress.  Neurological:     Mental Status: He  is alert and oriented to person, place, and time. Mental status is at baseline.        Assessment & Plan     Problem List Items Addressed This Visit       Digestive   Alcoholic cirrhosis of liver without ascites ([place]) - Primary    Discussed the importance of decreasing alcohol intake and cessation in the setting of cirrhosis and  thrombocytopenia Will f/u with GI and consider screening EGD for varices, but this is difficult with ongoing intake      Relevant Orders   For home use only DME Hospital bed     Other   Generalized weakness    Continued deconditioning, falls, and malnutrition in the setting of ongoing alcohol intake Decliens alcohol rehab Was not a candidate for [facility] PT Will attempt to get hospital bed as he has difficulty getting into and out of bed at home and needs to reposition.      Relevant Orders   For home use only DME Hospital bed   Protein-calorie malnutrition, severe   Relevant Orders   For home use only DME Hospital bed   Alcohol use disorder, moderate, dependence ([place])    Decliens to quit or seek rehab services at this time      Relevant Orders   For home use only DME Hospital bed   Frequent falls   Relevant Orders   For home use only DME Hospital bed   Generally unsteady   Relevant Orders   For home use only DME Hospital bed   Chronic bilateral low back pain without sciatica    Acute on chronic With recent falls and likely osteopenia in the setting of ongoing alcohol intake and malnutrition, concern for possible compression fracture Will get Lumbar XRay Treatment pending results Difficulty with pain meds in the setting of liver disease and thrombocytopenia      Relevant Orders   DG Lumbar Spine Complete     No follow-ups on file.     I discussed the assessment and treatment plan with the patient. The patient was provided an opportunity to ask questions and all were answered. The patient agreed with the plan and demonstrated an understanding of the instructions.   The patient was advised to call back or seek an in-person evaluation if the symptoms worsen or if the condition fails to improve as anticipated.  I, [name], MD, have reviewed all documentation for this visit. The documentation on 12/29/22 for the exam, diagnosis, procedures, and orders are all  accurate and complete.   Bacigalupo, Dionne Bucy, MD, MPH [facility]

## 2022-12-29 ENCOUNTER — Encounter: Payer: Self-pay | Admitting: Family Medicine

## 2022-12-29 ENCOUNTER — Telehealth (INDEPENDENT_AMBULATORY_CARE_PROVIDER_SITE_OTHER): Payer: Medicare Other | Admitting: Family Medicine

## 2022-12-29 VITALS — BP 101/69 | HR 120 | Wt 122.0 lb

## 2022-12-29 DIAGNOSIS — M545 Low back pain, unspecified: Secondary | ICD-10-CM

## 2022-12-29 DIAGNOSIS — F1020 Alcohol dependence, uncomplicated: Secondary | ICD-10-CM

## 2022-12-29 DIAGNOSIS — E43 Unspecified severe protein-calorie malnutrition: Secondary | ICD-10-CM | POA: Diagnosis not present

## 2022-12-29 DIAGNOSIS — R531 Weakness: Secondary | ICD-10-CM

## 2022-12-29 DIAGNOSIS — G8929 Other chronic pain: Secondary | ICD-10-CM

## 2022-12-29 DIAGNOSIS — K7030 Alcoholic cirrhosis of liver without ascites: Secondary | ICD-10-CM

## 2022-12-29 DIAGNOSIS — R2681 Unsteadiness on feet: Secondary | ICD-10-CM

## 2022-12-29 DIAGNOSIS — R296 Repeated falls: Secondary | ICD-10-CM

## 2022-12-29 NOTE — Assessment & Plan Note (Signed)
Acute on chronic With recent falls and likely osteopenia in the setting of ongoing alcohol intake and malnutrition, concern for possible compression fracture Will get Lumbar XRay Treatment pending results Difficulty with pain meds in the setting of liver disease and thrombocytopenia

## 2022-12-29 NOTE — Assessment & Plan Note (Signed)
Discussed the importance of decreasing alcohol intake and cessation in the setting of cirrhosis and thrombocytopenia Will f/u with GI and consider screening EGD for varices, but this is difficult with ongoing intake

## 2022-12-29 NOTE — Assessment & Plan Note (Signed)
Continued deconditioning, falls, and malnutrition in the setting of ongoing alcohol intake Decliens alcohol rehab Was not a candidate for [facility] PT Will attempt to get hospital bed as he has difficulty getting into and out of bed at home and needs to reposition.

## 2022-12-29 NOTE — Assessment & Plan Note (Signed)
Decliens to quit or seek rehab services at this time

## 2022-12-30 ENCOUNTER — Telehealth: Payer: Self-pay

## 2022-12-30 NOTE — Telephone Encounter (Signed)
Copied from [place] [id number]. Topic: General - Other >> Dec 30, 2022  2:44 PM Santiya F wrote: Reason for CRM: Clarene Critchley with [facility] called in because they received an order for a semi electric hospital bed but they didn't receive the narrative notes with the insurance on it. Clarene Critchley says if you have any questions, you can follow up with her at [phone].

## 2022-12-31 ENCOUNTER — Telehealth: Payer: Self-pay | Admitting: *Deleted

## 2022-12-31 ENCOUNTER — Encounter: Payer: Self-pay | Admitting: *Deleted

## 2022-12-31 NOTE — Telephone Encounter (Signed)
Faxed office notes along with insurance information to [phone].

## 2022-12-31 NOTE — Patient Outreach (Signed)
  Care Coordination   12/31/2022 Name: ISIDOR CABAN MRN: [id number] DOB: June 10, 1956   Care Coordination Outreach Attempts:  An unsuccessful telephone outreach was attempted for a scheduled appointment today.  Follow Up Plan:  Additional outreach attempts will be made to offer the patient care coordination information and services.   Encounter Outcome:  No Answer   Care Coordination Interventions:  No, not indicated    Dariona Postma, [place] Worker  [facility] Care Management [phone]

## 2023-01-01 ENCOUNTER — Telehealth: Payer: Medicare Other | Admitting: Family Medicine

## 2023-01-04 ENCOUNTER — Telehealth: Payer: Self-pay

## 2023-01-04 DIAGNOSIS — C220 Liver cell carcinoma: Secondary | ICD-10-CM

## 2023-01-04 DIAGNOSIS — K7030 Alcoholic cirrhosis of liver without ascites: Secondary | ICD-10-CM

## 2023-01-04 NOTE — Telephone Encounter (Signed)
Copied from [place] [id number]. Topic: Referral - Request for Referral >> Jan 04, 2023  8:40 AM Oley Balm A wrote: Has patient seen PCP for this complaint? Yes.   *If NO, is insurance requiring patient see PCP for this issue before PCP can refer them? Referral for which specialty: Referral for Palliative Care for them to come out and talk to the family Preferred provider/office: Wanting PCP to do the referal Reason for referral: Pt is diagnosed stage 4 Cirrhosis of the liver. Please call [name]  [phone] or [name]  [phone]

## 2023-01-07 ENCOUNTER — Other Ambulatory Visit: Payer: Self-pay

## 2023-01-07 ENCOUNTER — Emergency Department
Admission: EM | Admit: 2023-01-07 | Discharge: 2023-01-07 | Disposition: A | Discharge status: Home / Self Care | Payer: Medicare Other | Attending: Emergency Medicine | Admitting: Emergency Medicine

## 2023-01-07 DIAGNOSIS — I10 Essential (primary) hypertension: Secondary | ICD-10-CM | POA: Insufficient documentation

## 2023-01-07 DIAGNOSIS — E876 Hypokalemia: Secondary | ICD-10-CM | POA: Insufficient documentation

## 2023-01-07 DIAGNOSIS — K7030 Alcoholic cirrhosis of liver without ascites: Secondary | ICD-10-CM | POA: Diagnosis not present

## 2023-01-07 DIAGNOSIS — R55 Syncope and collapse: Secondary | ICD-10-CM | POA: Diagnosis not present

## 2023-01-07 DIAGNOSIS — R531 Weakness: Secondary | ICD-10-CM | POA: Diagnosis present

## 2023-01-07 LAB — URINALYSIS, ROUTINE W REFLEX MICROSCOPIC
Bilirubin Urine: NEGATIVE
Glucose, UA: NEGATIVE mg/dL
Hgb urine dipstick: NEGATIVE
Ketones, ur: NEGATIVE mg/dL
Leukocytes,Ua: NEGATIVE
Nitrite: NEGATIVE
Protein, ur: NEGATIVE mg/dL
Specific Gravity, Urine: 1.008 (ref 1.005–1.030)
pH: 7 (ref 5.0–8.0)

## 2023-01-07 LAB — COMPREHENSIVE METABOLIC PANEL
ALT: 47 U/L — ABNORMAL HIGH (ref 0–44)
AST: 133 U/L — ABNORMAL HIGH (ref 15–41)
Albumin: 2.9 g/dL — ABNORMAL LOW (ref 3.5–5.0)
Alkaline Phosphatase: 184 U/L — ABNORMAL HIGH (ref 38–126)
Anion gap: 14 (ref 5–15)
BUN: 10 mg/dL (ref 8–23)
CO2: 24 mmol/L (ref 22–32)
Calcium: 8.7 mg/dL — ABNORMAL LOW (ref 8.9–10.3)
Chloride: 98 mmol/L (ref 98–111)
Creatinine, Ser: 0.64 mg/dL (ref 0.61–1.24)
GFR, Estimated: >60 mL/min (ref >60)
Glucose, Bld: 137 mg/dL — ABNORMAL HIGH (ref 70–99)
Potassium: 3.3 mmol/L — ABNORMAL LOW (ref 3.5–5.1)
Sodium: 136 mmol/L (ref 135–145)
Total Bilirubin: 4 mg/dL — ABNORMAL HIGH (ref 0.3–1.2)
Total Protein: 6.6 g/dL (ref 6.5–8.1)

## 2023-01-07 LAB — CBC
HCT: 32.9 % — ABNORMAL LOW (ref 39.0–52.0)
Hemoglobin: 11.7 g/dL — ABNORMAL LOW (ref 13.0–17.0)
MCH: 35.9 pg — ABNORMAL HIGH (ref 26.0–34.0)
MCHC: 35.6 g/dL (ref 30.0–36.0)
MCV: 100.9 fL — ABNORMAL HIGH (ref 80.0–100.0)
Platelets: 108 10*3/uL — ABNORMAL LOW (ref 150–400)
RBC: 3.26 MIL/uL — ABNORMAL LOW (ref 4.22–5.81)
RDW: 18.5 % — ABNORMAL HIGH (ref 11.5–15.5)
WBC: 3.3 10*3/uL — ABNORMAL LOW (ref 4.0–10.5)
nRBC: 0 % (ref 0.0–0.2)

## 2023-01-07 LAB — ETHANOL: Alcohol, Ethyl (B): 142 mg/dL — ABNORMAL HIGH (ref <10)

## 2023-01-07 LAB — TROPONIN I (HIGH SENSITIVITY): Troponin I (High Sensitivity): 6 ng/L (ref <18)

## 2023-01-07 LAB — MAGNESIUM: Magnesium: 1.5 mg/dL — ABNORMAL LOW (ref 1.7–2.4)

## 2023-01-07 MED ORDER — MAGNESIUM SULFATE 2 GM/50ML IV SOLN
2.0000 g | Freq: Once | INTRAVENOUS | Status: AC
  Administered 2023-01-07: 2 g via INTRAVENOUS
  Filled 2023-01-07: qty 50

## 2023-01-07 MED ORDER — ONDANSETRON HCL 4 MG/2ML IJ SOLN
4.0000 mg | Freq: Once | INTRAMUSCULAR | Status: AC
  Administered 2023-01-07: 4 mg via INTRAVENOUS
  Filled 2023-01-07: qty 2

## 2023-01-07 MED ORDER — POTASSIUM CHLORIDE CRYS ER 20 MEQ PO TBCR
40.0000 meq | EXTENDED_RELEASE_TABLET | Freq: Once | ORAL | Status: AC
  Administered 2023-01-07: 40 meq via ORAL
  Filled 2023-01-07: qty 2

## 2023-01-07 NOTE — ED Triage Notes (Signed)
See first nurse note. Pt denies chest pain or pressure. Denies SOB. Pt states dizziness has resolved and denies h/a, vision change, parasthesia. Pt denies dysuria or other urinary symptoms. Pt alert and oriented in triage. Pt denies using walker or cane at home.

## 2023-01-07 NOTE — ED Provider Notes (Signed)
[facility] Provider Note    Event Date/Time   First MD Initiated Contact with Patient 01/07/23 1955     (approximate)   History   Chief Complaint possible syncope and Weakness   HPI  Mark Carr is a 67 y.o. male with past medical history of hypertension, alcohol abuse, and cirrhosis who presents to the ED complaining of near syncope.  Patient reports that he was at home earlier this evening when he began to have pain in both sides of his upper abdomen.  He began to feel very lightheaded with this pain, like he might pass out.  He was able to lay down on the couch and denies ever fully losing consciousness.  He denies any associated chest pain or shortness of breath.  Family reported to EMS that patient was slow to respond for a few seconds, however patient adamantly denies that he lost consciousness.  There was no witnessed seizure like activity.  Patient reports that he is now feeling better and is requesting to be discharged home.  He denies any ongoing pain in his abdomen and has not had any nausea, vomiting, or diarrhea.  He reports similar episodes of syncope and near syncope in the past associated with abdominal pain.     Physical Exam   Triage Vital Signs: ED Triage Vitals  Enc Vitals Group     BP 01/07/23 1914 (!) 151/97     Pulse Rate 01/07/23 1914 94     Resp 01/07/23 1914 18     Temp 01/07/23 1914 98.5 F (36.9 C)     Temp Source 01/07/23 1914 Oral     SpO2 01/07/23 1914 95 %     Weight 01/07/23 1913 133 lb (60.3 kg)     Height 01/07/23 1913 '5\' 8"'$  (1.727 m)     Head Circumference --      Peak Flow --      Pain Score 01/07/23 1913 0     Pain Loc --      Pain Edu? --      Excl. in [place]? --     Most recent vital signs: Vitals:   01/07/23 1946 01/07/23 2100  BP: (!) 113/102 (!) 154/101  Pulse: 88 87  Resp: 18 18  Temp:    SpO2: 95% 97%    Constitutional: Alert and oriented. Eyes: Conjunctivae are normal. Head:  Atraumatic. Nose: No congestion/rhinnorhea. Mouth/Throat: Mucous membranes are moist.  Cardiovascular: Normal rate, regular rhythm. Grossly normal heart sounds.  2+ radial pulses bilaterally. Respiratory: Normal respiratory effort.  No retractions. Lungs CTAB. Gastrointestinal: Soft and nontender. No distention. Musculoskeletal: No lower extremity tenderness nor edema.  Neurologic:  Normal speech and language. No gross focal neurologic deficits are appreciated.    ED Results / Procedures / Treatments   Labs (all labs ordered are listed, but only abnormal results are displayed) Labs Reviewed  CBC - Abnormal; Notable for the following components:      Result Value   WBC 3.3 (*)    RBC 3.26 (*)    Hemoglobin 11.7 (*)    HCT 32.9 (*)    MCV 100.9 (*)    MCH 35.9 (*)    RDW 18.5 (*)    Platelets 108 (*)    All other components within normal limits  URINALYSIS, ROUTINE W REFLEX MICROSCOPIC - Abnormal; Notable for the following components:   Color, Urine YELLOW (*)    APPearance CLEAR (*)    All other components within  normal limits  ETHANOL - Abnormal; Notable for the following components:   Alcohol, Ethyl (B) 142 (*)    All other components within normal limits  COMPREHENSIVE METABOLIC PANEL - Abnormal; Notable for the following components:   Potassium 3.3 (*)    Glucose, Bld 137 (*)    Calcium 8.7 (*)    Albumin 2.9 (*)    AST 133 (*)    ALT 47 (*)    Alkaline Phosphatase 184 (*)    Total Bilirubin 4.0 (*)    All other components within normal limits  MAGNESIUM - Abnormal; Notable for the following components:   Magnesium 1.5 (*)    All other components within normal limits  CBG MONITORING, ED  TROPONIN I (HIGH SENSITIVITY)     EKG  ED ECG REPORT I, Blake Divine, the attending physician, personally viewed and interpreted this ECG.   Date: 01/07/2023  EKG Time: 19:14  Rate: 93  Rhythm: normal sinus rhythm  Axis: Normal  Intervals:none  ST&T Change:  None  PROCEDURES:  Critical Care performed: No  Procedures   MEDICATIONS ORDERED IN ED: Medications  magnesium sulfate IVPB 2 g 50 mL (2 g Intravenous New Bag/Given 01/07/23 2151)  ondansetron (ZOFRAN) injection 4 mg (4 mg Intravenous Given 01/07/23 1949)     IMPRESSION / MDM / ASSESSMENT AND PLAN / ED COURSE  I reviewed the triage vital signs and the nursing notes.                              67 y.o. male with past medical history of hypertension, alcohol abuse, and cirrhosis who presents to the ED following lightheadedness and near syncopal episode after acute onset upper abdominal pain that has now resolved.  Patient's presentation is most consistent with acute presentation with potential threat to life or bodily function.  Differential diagnosis includes, but is not limited to, arrhythmia, ACS, electrolyte abnormality, AKI, anemia, vasovagal episode.  Patient well-appearing and in no acute distress, vital signs are unremarkable.  He states that his abdominal pain has resolved and he has a benign abdominal exam.  EKG shows no evidence of arrhythmia or ischemia, symptoms sound most consistent with vasovagal episode given association with abdominal pain and similar episodes in the past.  We will observe for arrhythmia and screening labs including CBC, CMP, and troponin.  Patient does admit to ongoing regular alcohol consumption, denies significant consumption this evening.  No evidence of traumatic injury related to episode.  Labs remarkable for hypomagnesemia and mild hypokalemia, no acute AKI noted.  Transaminitis appears improved compared to previous, bilirubin stable with no alcoholic ketoacidosis noted on this visit.  Troponin within normal limits and no events noted on cardiac monitor, doubt cardiac etiology for syncope.  Ethanol level is elevated and patient counseled on his alcohol use and how this will only worsen his liver function, eventually leading to liver failure.  Patient  and daughter expressed understanding, he still strongly desires to be discharged home.  He was counseled to follow-up with PCP for recheck of his electrolytes, states he has appointment with GI later this year.  He was counseled to return to the ED for new or worsening symptoms, patient agrees with plan.      FINAL CLINICAL IMPRESSION(S) / ED DIAGNOSES   Final diagnoses:  Near syncope  Alcoholic cirrhosis of liver without ascites ([place])  Hypomagnesemia     Rx / DC Orders   ED Discharge  Orders     None        Note:  This document was prepared using Dragon voice recognition software and may include unintentional dictation errors.   Blake Divine, MD 01/07/23 2156

## 2023-01-07 NOTE — ED Triage Notes (Signed)
First Nurse Note:  BIB [facility] from home. Pt reported syncopal episode at home. Stated to EMS that he got dizzy and laid down. Family endorsed to EMS that pt was unresponsive for a couple seconds. Denies seizure activity. Pt alert and oriented on EMS arrival, and pt denies pain to EMS. Pt c/o generalized weakness. Pt arrives to triage in wheelchair. Noted to be breathing unlabored with symmetric chest rise and fall and speaking in full sentences.   162/80 HR 87 100% RA  139 CBG 98.2 oral temp

## 2023-01-07 NOTE — ED Notes (Signed)
ED Provider at bedside. 

## 2023-01-08 ENCOUNTER — Telehealth: Payer: Self-pay

## 2023-01-08 NOTE — Telephone Encounter (Signed)
Palliative care outreach to patient/spouse to schedule initial PC consult.  Visit scheduled for Thu 3/7 '@2pm'$ , per patient request due to patient/spouse wanting their children to present for visit.

## 2023-01-11 ENCOUNTER — Telehealth: Payer: Self-pay

## 2023-01-11 NOTE — Transitions of Care (Post Inpatient/ED Visit) (Signed)
   01/11/2023  Name: NATNAEL EPPINGER MRN: [id number] DOB: [birth year]  Today's TOC FU Call Status: Today's TOC FU Call Status:: Successful TOC FU Call Competed Unsuccessful Call (1st Attempt) Date: 01/11/23 [facility] FU Call Complete Date: 01/11/23  Transition Care Management Follow-up Telephone Call Date of Discharge: 01/07/23 Discharge Facility: [facility]) Type of Discharge: Emergency Department Reason for ED Visit: Other: How have you been since you were released from the hospital?: Better Any questions or concerns?: No  Items Reviewed: Did you receive and understand the discharge instructions provided?: No Medications obtained and verified?: Yes (Medications Reviewed) Any new allergies since your discharge?: No Dietary orders reviewed?: Yes Do you have support at home?: Yes People in Home: spouse, child(ren), adult Name of Support/Comfort Primary Source: [place] and [place] and Equipment/Supplies: [place] Ordered?: No Any new equipment or medical supplies ordered?: No  Functional Questionnaire:    Folllow up appointments reviewed:      SIGNATURE" Stephan Minister, CMA

## 2023-01-11 NOTE — Telephone Encounter (Signed)
Copied from [place] [phone]. Topic: General - Inquiry >> Jan 11, 2023  9:13 AM Marcellus Scott wrote: Reason for CRM: Pt wife is returning a missed call from [place], [place], [place].  Wife is requesting a callback.

## 2023-01-11 NOTE — Transitions of Care (Post Inpatient/ED Visit) (Signed)
   01/11/2023  Name: Mark Carr MRN: [id number] DOB: October 02, 1956  Today's TOC FU Call Status: Today's TOC FU Call Status:: Unsuccessul Call (1st Attempt) Unsuccessful Call (1st Attempt) Date: 01/11/23  Attempted to reach the patient regarding the most recent Inpatient/ED visit.  Follow Up Plan: Additional outreach attempts will be made to reach the patient to complete the Transitions of Care (Post Inpatient/ED visit) call.   Signature Stephan Minister, [place]

## 2023-01-14 ENCOUNTER — Other Ambulatory Visit: Payer: Medicare Other

## 2023-01-14 DIAGNOSIS — Z515 Encounter for palliative care: Secondary | ICD-10-CM

## 2023-01-14 NOTE — Progress Notes (Signed)
COMMUNITY PALLIATIVE CARE SW NOTE  PATIENT NAME: Mark Carr DOB: 27-May-1956 MRN: [id number]  PRIMARY CARE PROVIDER: Virginia Crews, MD  RESPONSIBLE PARTY:  Acct ID - Guarantor Home Phone Work Phone Relationship Acct Type  [id number] SONAM, [phone]  Self P/F     [address], [place],  [address]     PLAN OF CARE and INTERVENTIONS:              GOALS OF CARE/ ADVANCE CARE PLANNING:    Goals include to maximize quality of life and assist with pain management. Our advance care planning conversation included a discussion about:    The value and importance of advance care planning  Review and updating or creation of an advance directive document.                          Code Status: FULL CODE   ACD: None in place. Ongoing discussion, as patient share that he does not want to go to the hospital and stay but will go if he had to be treated. Aggressive treatment vs comfort measures discussed briefly, will discuss in more detail at next visit.     2.        SOCIAL/EMOTIONAL/SPIRITUAL ASSESSMENT/ INTERVENTIONS:         Palliative care encounter: SW completed initial in home visit with patient, patient spouse, 2 daughters and son. PC referral received from Dr. [name].   Presenting problem: patient with recent ED visits due to syncope caused by alcohol intoxication. Patient has hx of Alcoholic cirrhosis of liver, hypertension, protein calorie malnutrition and memory impairment.  Patient with physical functional decline due to chronic illnesses.  Patient was received sitting on couch in living room and sated he had just finished eating. Alcoholism: brief open conversation about patients alcohol use. Patient share that he has cut down on drinking and wished to stop. However, patients family state that patients drinking habit is the same, patient drinks nearly a 12 pack of beer a day. Patients drinking seems to be causing memory impairment as well as functional  impairment as family share that patient has a lot falls, with the most recent one being last night, where patient fell beside the bed, no injuries sustained.  Cirrhosis: patients eyes have a yellowish tint to them, and per patient they burn and water every now and then, patient is using OTC eye drops to calm the burning. The inside of patients palms also have a yellowish tint. Patient is scheduled to see GI in May.   Mobility: Patient is (I) with all ADL's. Patient has hospital bed due to falling out of previous bed. Per patient and family his mobility seems to decrease in the late evenings/night, mostly due to the alcohol. Patient is a fall risk. Medication management: Family and share that he is non compliant with medications. Patient share that some medications make is stomach hurt and makes him nauseous. Patient does not have anything for nausea. SW expressed the importance of medication compliance. Patient has follow up appt with PCP 3/11, SW advised inquiring about nausea medication as well as memory impairment causes.  Appetite: Patient share that he has a pretty good appetite. Palliative care will follow up in 2-3 weeks.    Psychosocial assessment: completed.   In home support: Patient resides with his wife. Patient was assessed by [facility] but was not picked due to being too high functioning for [facility]. Private caregiver support  discussed with family for night time to assist with spouse and the option for MCD to cover this.  Transportation: no needs.  Food: no food insecurities witnessed.   Safety and long term planning: patient feels safe in his home and desires to remain in his home with sister.    SW discussed goals, reviewed care plan, provided emotional support, used active and reflective listening in the form of reciprocity emotional response. Questions and concerns were addressed. The patient/family was encouraged to call with any additional questions and/or concerns. PC Provided general  support and encouragement, no other unmet needs identified. Will continue to follow.   3.         PATIENT/CAREGIVER EDUCATION/ COPING:   Appearance: well groomed, appropriate given situation  Mental Status: Alert/oriented. Eye Contact: Good. Able to engage in proper eye contact  Thought Process: rational  Thought Content: not assessed  Speech: normal  Mood: Normal and calm Affect: Congruent to endorsed mood, full ranging Insight: normal Judgement: normal  Interaction Style: Cooperative   Patient A&O, patient engaged in fluent conversation and answered all questions appropriately. No cognitive deficits witnessed, however family and patient share that patient becomes more disoriented in the late evenings/night. SW and family discussed the possibility of patient having some dementia due to the alcoholism or the increase in alcohol thorough out the day causing disorientation at night.   4.         PERSONAL EMERGENCY PLAN:  Patient will call 9-1-1 for emergencies.    5.         COMMUNITY RESOURCES COORDINATION/ HEALTH CARE NAVIGATION:  patients spouse and daughter manages his care.    6.      FINANCIAL CONCERNS/NEEDS: none                         Primary Health Insurance:  Medicare Secondary Health Insurance: Medicaid Prescription Coverage: Yes, no history of difficulty obtaining or affording prescriptions reported.     SOCIAL HX:  Social History   Tobacco Use   Smoking status: Every Day    Packs/day: 0.50    Years: 40.00    Total pack years: 20.00    Types: Cigarettes   Smokeless tobacco: Never  Substance Use Topics   Alcohol use: Yes    Alcohol/week: 4.0 standard drinks of alcohol    Types: 4 Cans of beer per week    Comment: 4 beers a day    CODE STATUS: FULL CODE  ADVANCED DIRECTIVES: N MOST FORM COMPLETE:  N HOSPICE EDUCATION PROVIDED: N  PPS:50%  Time spent: 40 min   [place] Henrene Pastor, [place]

## 2023-01-18 ENCOUNTER — Telehealth: Payer: Self-pay | Admitting: Family Medicine

## 2023-01-18 ENCOUNTER — Telehealth (INDEPENDENT_AMBULATORY_CARE_PROVIDER_SITE_OTHER): Payer: Medicare Other | Admitting: Family Medicine

## 2023-01-18 ENCOUNTER — Inpatient Hospital Stay: Payer: Medicare Other | Admitting: Family Medicine

## 2023-01-18 ENCOUNTER — Encounter: Payer: Self-pay | Admitting: Family Medicine

## 2023-01-18 VITALS — BP 105/77 | HR 100 | Wt 122.0 lb

## 2023-01-18 DIAGNOSIS — E512 Wernicke's encephalopathy: Secondary | ICD-10-CM

## 2023-01-18 DIAGNOSIS — F1020 Alcohol dependence, uncomplicated: Secondary | ICD-10-CM | POA: Diagnosis not present

## 2023-01-18 DIAGNOSIS — E43 Unspecified severe protein-calorie malnutrition: Secondary | ICD-10-CM | POA: Diagnosis not present

## 2023-01-18 DIAGNOSIS — R296 Repeated falls: Secondary | ICD-10-CM

## 2023-01-18 DIAGNOSIS — R531 Weakness: Secondary | ICD-10-CM | POA: Diagnosis not present

## 2023-01-18 NOTE — Telephone Encounter (Signed)
Link sent

## 2023-01-18 NOTE — Assessment & Plan Note (Signed)
Falls and malnutrition in the setting of ongoing alcohol intake. Declines alcohol rehab at this time.  Counseled on importance of thiamine supplementation and nutrition.

## 2023-01-18 NOTE — Assessment & Plan Note (Signed)
Chronic. Counseled on importance of thiamine supplementation and nutrition.

## 2023-01-18 NOTE — Progress Notes (Deleted)
-   hospital follow up - low mag and low K w/o AKI

## 2023-01-18 NOTE — Telephone Encounter (Signed)
Pts appt was changed to virtual appt during lunch. Daughter is requesting if link can be sent to her phone prior to the appt. Her number is [phone]

## 2023-01-18 NOTE — Assessment & Plan Note (Addendum)
Longstanding issue with two falls within the last week.  Likely exacerbated by Wernicke encephalopathy.

## 2023-01-18 NOTE — Assessment & Plan Note (Signed)
Recent falls and confusion in the setting of not adhering to thiamine supplements, Wernicke encephalopathy is suspected.  Patient counseled on the importance of thiamine supplementation.

## 2023-01-18 NOTE — Assessment & Plan Note (Signed)
Declines to quit or seek rehab at this time.

## 2023-01-18 NOTE — Progress Notes (Signed)
Acute Office Visit  Subjective:     Patient ID: Mark Carr, male    DOB: 06/23/1956, 67 y.o.   MRN: [id number]  Chief Complaint  Patient presents with   Loss of Consciousness    HPI Garang is a 67 year old man with history of alcohol use disorder and alcoholic cirrhosis who is being seen over video visit today for follow up from a recent ED visit for near syncope and abdominal pain. He has not had any repeat of his abdominal pain and he does not report feeling lightheaded, but his family reports that he has had two falls in the past week. He says that these falls occurred getting up from bed to go to the bathroom. He denies hitting his head and loss of consciousness during these falls. His family also reports tremulousness in his hands and feet in the morning, along with confusion in the evenings. When asked about his prescribed thiamine supplement he says that he has not been taking it.  -   Review of Systems  Cardiovascular:  Negative for chest pain.  Gastrointestinal:  Negative for abdominal pain.  Neurological:  Negative for dizziness.        Objective:    BP 105/77 (BP Location: Left Arm, Patient Position: Sitting, Cuff Size: Normal)   Pulse 100   Wt 122 lb (55.3 kg)   BMI 18.55 kg/m    Physical Exam Constitutional:      General: He is not in acute distress.    Appearance: Normal appearance. He is not diaphoretic.  HENT:     Head: Normocephalic.  Eyes:     Conjunctiva/sclera: Conjunctivae normal.  Pulmonary:     Effort: Pulmonary effort is normal. No respiratory distress.  Neurological:     Mental Status: He is alert and oriented to person, place, and time. Mental status is at baseline.     No results found for any visits on 01/18/23.      Assessment & Plan:   Problem List Items Addressed This Visit       Nervous and Auditory   Wernicke encephalopathy    Recent falls and confusion in the setting of not adhering to thiamine supplements, Wernicke  encephalopathy is suspected.  Patient counseled on the importance of thiamine supplementation.         Other   Generalized weakness    Falls and malnutrition in the setting of ongoing alcohol intake. Declines alcohol rehab at this time.  Counseled on importance of thiamine supplementation and nutrition.       Protein-calorie malnutrition, severe    Chronic. Counseled on importance of thiamine supplementation and nutrition.       Alcohol use disorder, moderate, dependence ([place]) - Primary    Declines to quit or seek rehab at this time.       Frequent falls    Longstanding issue with two falls within the last week.  Likely exacerbated by Wernicke encephalopathy.        No orders of the defined types were placed in this encounter.   Return in about 3 months (around 04/20/2023) for chronic disease f/u.  Wynona Dove, Medical Student  Patient seen along with MS3 student Everlene Balls. I personally evaluated this patient along with the student, and verified all aspects of the history, physical exam, and medical decision making as documented by the student. I agree with the student's documentation and have made all necessary edits.  Azarel Banner, Dionne Bucy, MD, MPH [facility]  [facility]

## 2023-02-03 ENCOUNTER — Other Ambulatory Visit: Payer: Medicare Other

## 2023-02-03 VITALS — BP 133/88 | HR 111 | Temp 98.2°F

## 2023-02-03 DIAGNOSIS — Z515 Encounter for palliative care: Secondary | ICD-10-CM

## 2023-02-03 NOTE — Progress Notes (Signed)
PATIENT NAME: Mark Carr DOB: [birth year] MRN: [id number]  PRIMARY CARE PROVIDER: Virginia Crews, MD  RESPONSIBLE PARTY:  Acct ID - Guarantor Home Phone Work Phone Relationship Acct Type  [id number] [name], [name]  Self P/F     [address] [place], [place] [address]   Home visit completed with patient, daughter, wife and [place], [place].   ACP:  No living will in place.  SW left documents.  Discussed code status with patient and he desires CPR.  Discussed goals of care.  Patient will go back to the hospital if needed.  Further discussion to be completed amongst family regarding medical goals.  Left MOST form and Hard Choices with patient and family.  Cirrhosis: Education provided on disease and disease progression.  Patient is scheduled to see GI in May.  Patient continues to drink no less than a 12 pack of beer daily.  He has no desire to stop drinking despite knowing complications that come with his dx.  Jaundice present.  Medication Management:  Patient has not taken any medication in the last year per daughter.  Patient advised he would get nauseated when taking medications.  When he took meds with food on his stomach there was no issue.  Wife states he just abruptly stopped and has not been compliant with meds since.   Wernicke encephalopathy:  Discussed importance/purpose of taking thiamine daily as directed by PCP.  Patient verbalized understanding and states he is willing to take this daily.    Follow up visit scheduled for 1 month.    CODE STATUS: Full ADVANCED DIRECTIVES: No MOST FORM: No PPS: 50%   PHYSICAL EXAM:   VITALS: Today's Vitals   02/03/23 1349  BP: 133/88  Pulse: (!) 111  Temp: 98.2 F (36.8 C)  SpO2: 97%    LUNGS: clear to auscultation  CARDIAC: Cor Tachy}  EXTREMITIES: - for edema SKIN: Skin color, texture, turgor normal. No rashes or lesions or mobility and turgor normal  NEURO: positive for memory problems       Lorenza Burton, RN

## 2023-02-03 NOTE — Progress Notes (Signed)
COMMUNITY PALLIATIVE CARE SW NOTE  PATIENT NAME: Mark Carr DOB: 02-09-1956 MRN: [id number]  PRIMARY CARE PROVIDER: Virginia Crews, MD  RESPONSIBLE PARTY:  Acct ID - Guarantor Home Phone Work Phone Relationship Acct Type  [id number] DONTERRIOUS, [phone]  Self P/F     [address] [place], [place] [address]     PLAN OF CARE and INTERVENTIONS:               GOALS OF CARE/ ADVANCE CARE PLANNING:    Goals include to maximize quality of life and assist with pain management. Our advance care planning conversation included a discussion about:    The value and importance of advance care planning  Review and updating or creation of an advance directive document.                          Code Status: FULL CODE                         ACD: None in place. Ongoing discussion, as patient share that he does not want to go to the hospital and stay but will go if he had to be treated. Aggressive treatment vs comfort measures discussed briefly. MOST form and hard choices book left. AS well as HCPOA documents.     2.        SOCIAL/EMOTIONAL/SPIRITUAL ASSESSMENT/ INTERVENTIONS:         Palliative care encounter: SW and RN completed initial in home visit with patient, patient spouse, and daughter.   Presenting problem: Patient recently saw PCP 2 weeks ago. Patient presumed to have wernicke's encephalopathy induced by alcohol. Patient encouraged to take Thiamine. Patient educated on thiamine levels and side effects.  Alcoholism: patient continues to drink 12 pack daily, with no intention to stop.  Cirrhosis: patients eyes have a yellowish tint to them, and per patient they burn and water every now and then, patient is using OTC eye drops to calm the burning. The inside of patients palms also have a yellowish tint. Patient is scheduled to see GI in May.    Medication management: patient continues to be non compliant with medications. Patient did agree tot begin taking thiamine  daily.  Appetite: Patient share that he has a pretty good appetite.  Palliative care will follow 4/30.    Psychosocial assessment: completed.     SW discussed goals, reviewed care plan, provided emotional support, used active and reflective listening in the form of reciprocity emotional response. Questions and concerns were addressed. The patient/family was encouraged to call with any additional questions and/or concerns. PC Provided general support and encouragement, no other unmet needs identified. Will continue to follow.   3.         PATIENT/CAREGIVER EDUCATION/ COPING:   Appearance: well groomed, appropriate given situation  Mental Status: Alert/oriented. Eye Contact: Good. Able to engage in proper eye contact  Thought Process: rational  Thought Content: not assessed  Speech: normal  Mood: Normal and calm Affect: Congruent to endorsed mood, full ranging Insight: normal Judgement: normal  Interaction Style: Cooperative   Patient A&O, patient engaged in fluent conversation and answered all questions appropriately. No cognitive deficits witnessed, however family and patient share that patient becomes more disoriented in the late evenings/night. SW and family discussed the possibility of patient having some dementia due to the alcoholism or the increase in alcohol thorough out the day causing disorientation at night.  4.         PERSONAL EMERGENCY PLAN:  Patient will call 9-1-1 for emergencies.    5.         COMMUNITY RESOURCES COORDINATION/ HEALTH CARE NAVIGATION:  patients spouse and daughter manages his care.    6.      FINANCIAL CONCERNS/NEEDS: none                         Primary Health Insurance:  Medicare Secondary Health Insurance: Medicaid Prescription Coverage: Yes, no history of difficulty obtaining or affording prescriptions reported.     SOCIAL HX:  Social History   Tobacco Use   Smoking status: Every Day    Packs/day: 0.50    Years: 40.00    Additional pack  years: 0.00    Total pack years: 20.00    Types: Cigarettes   Smokeless tobacco: Never  Substance Use Topics   Alcohol use: Yes    Alcohol/week: 4.0 standard drinks of alcohol    Types: 4 Cans of beer per week    Comment: 4 beers a day    CODE STATUS: FULL CODE ADVANCED DIRECTIVES: N. discussed. MOST FORM COMPLETE:  N. discussed HOSPICE EDUCATION PROVIDED: N  PPS:50%      [place], [place]

## 2023-02-15 ENCOUNTER — Emergency Department
Admission: EM | Admit: 2023-02-15 | Discharge: 2023-02-15 | Disposition: A | Discharge status: Home / Self Care | Payer: Medicare Other | Attending: Emergency Medicine | Admitting: Emergency Medicine

## 2023-02-15 ENCOUNTER — Emergency Department: Payer: Medicare Other

## 2023-02-15 ENCOUNTER — Other Ambulatory Visit: Payer: Self-pay

## 2023-02-15 DIAGNOSIS — I10 Essential (primary) hypertension: Secondary | ICD-10-CM | POA: Diagnosis not present

## 2023-02-15 DIAGNOSIS — E162 Hypoglycemia, unspecified: Secondary | ICD-10-CM | POA: Diagnosis not present

## 2023-02-15 DIAGNOSIS — R791 Abnormal coagulation profile: Secondary | ICD-10-CM | POA: Diagnosis not present

## 2023-02-15 DIAGNOSIS — Y906 Blood alcohol level of 120-199 mg/100 ml: Secondary | ICD-10-CM | POA: Insufficient documentation

## 2023-02-15 DIAGNOSIS — F10920 Alcohol use, unspecified with intoxication, uncomplicated: Secondary | ICD-10-CM

## 2023-02-15 DIAGNOSIS — R519 Headache, unspecified: Secondary | ICD-10-CM | POA: Diagnosis present

## 2023-02-15 DIAGNOSIS — Z79899 Other long term (current) drug therapy: Secondary | ICD-10-CM | POA: Diagnosis not present

## 2023-02-15 DIAGNOSIS — F10220 Alcohol dependence with intoxication, uncomplicated: Secondary | ICD-10-CM | POA: Diagnosis not present

## 2023-02-15 DIAGNOSIS — Z1152 Encounter for screening for COVID-19: Secondary | ICD-10-CM | POA: Diagnosis not present

## 2023-02-15 DIAGNOSIS — Z8616 Personal history of COVID-19: Secondary | ICD-10-CM | POA: Insufficient documentation

## 2023-02-15 LAB — COMPREHENSIVE METABOLIC PANEL
ALT: 80 U/L — ABNORMAL HIGH (ref 0–44)
AST: 258 U/L — ABNORMAL HIGH (ref 15–41)
Albumin: 3 g/dL — ABNORMAL LOW (ref 3.5–5.0)
Alkaline Phosphatase: 115 U/L (ref 38–126)
Anion gap: 18 — ABNORMAL HIGH (ref 5–15)
BUN: 8 mg/dL (ref 8–23)
CO2: 19 mmol/L — ABNORMAL LOW (ref 22–32)
Calcium: 8.6 mg/dL — ABNORMAL LOW (ref 8.9–10.3)
Chloride: 89 mmol/L — ABNORMAL LOW (ref 98–111)
Creatinine, Ser: 0.87 mg/dL (ref 0.61–1.24)
GFR, Estimated: >60 mL/min (ref >60)
Glucose, Bld: 86 mg/dL (ref 70–99)
Potassium: 3.7 mmol/L (ref 3.5–5.1)
Sodium: 126 mmol/L — ABNORMAL LOW (ref 135–145)
Total Bilirubin: 5.7 mg/dL — ABNORMAL HIGH (ref 0.3–1.2)
Total Protein: 7.1 g/dL (ref 6.5–8.1)

## 2023-02-15 LAB — CBC WITH DIFFERENTIAL/PLATELET
Abs Immature Granulocytes: 0.01 10*3/uL (ref 0.00–0.07)
Basophils Absolute: 0 10*3/uL (ref 0.0–0.1)
Basophils Relative: 0 %
Eosinophils Absolute: 0 10*3/uL (ref 0.0–0.5)
Eosinophils Relative: 0 %
HCT: 33.2 % — ABNORMAL LOW (ref 39.0–52.0)
Hemoglobin: 11.6 g/dL — ABNORMAL LOW (ref 13.0–17.0)
Immature Granulocytes: 0 %
Lymphocytes Relative: 17 %
Lymphs Abs: 0.6 10*3/uL — ABNORMAL LOW (ref 0.7–4.0)
MCH: 36.7 pg — ABNORMAL HIGH (ref 26.0–34.0)
MCHC: 34.9 g/dL (ref 30.0–36.0)
MCV: 105.1 fL — ABNORMAL HIGH (ref 80.0–100.0)
Monocytes Absolute: 0.2 10*3/uL (ref 0.1–1.0)
Monocytes Relative: 6 %
Neutro Abs: 2.6 10*3/uL (ref 1.7–7.7)
Neutrophils Relative %: 77 %
Platelets: 42 10*3/uL — ABNORMAL LOW (ref 150–400)
RBC: 3.16 MIL/uL — ABNORMAL LOW (ref 4.22–5.81)
RDW: 14.3 % (ref 11.5–15.5)
Smear Review: DECREASED
WBC: 3.3 10*3/uL — ABNORMAL LOW (ref 4.0–10.5)
nRBC: 0 % (ref 0.0–0.2)

## 2023-02-15 LAB — PROTIME-INR
INR: 1.6 — ABNORMAL HIGH (ref 0.8–1.2)
Prothrombin Time: 19.2 seconds — ABNORMAL HIGH (ref 11.4–15.2)

## 2023-02-15 LAB — RESP PANEL BY RT-PCR (RSV, FLU A&B, COVID)  RVPGX2
Influenza A by PCR: NEGATIVE
Influenza B by PCR: NEGATIVE
Resp Syncytial Virus by PCR: NEGATIVE
SARS Coronavirus 2 by RT PCR: NEGATIVE

## 2023-02-15 LAB — CBG MONITORING, ED
Glucose-Capillary: 112 mg/dL — ABNORMAL HIGH (ref 70–99)
Glucose-Capillary: 75 mg/dL (ref 70–99)
Glucose-Capillary: 147 mg/dL — ABNORMAL HIGH (ref 70–99)

## 2023-02-15 LAB — AMMONIA: Ammonia: 45 umol/L — ABNORMAL HIGH (ref 9–35)

## 2023-02-15 LAB — LIPASE, BLOOD: Lipase: 33 U/L (ref 11–51)

## 2023-02-15 LAB — ETHANOL: Alcohol, Ethyl (B): 133 mg/dL — ABNORMAL HIGH (ref <10)

## 2023-02-15 LAB — TROPONIN I (HIGH SENSITIVITY)
Troponin I (High Sensitivity): 19 ng/L — ABNORMAL HIGH (ref <18)
Troponin I (High Sensitivity): 22 ng/L — ABNORMAL HIGH (ref <18)

## 2023-02-15 MED ORDER — SODIUM CHLORIDE 0.9 % IV BOLUS
1000.0000 mL | Freq: Once | INTRAVENOUS | Status: AC
  Administered 2023-02-15: 1000 mL via INTRAVENOUS

## 2023-02-15 MED ORDER — THIAMINE HCL 100 MG PO TABS
100.0000 mg | ORAL_TABLET | Freq: Every day | ORAL | Status: DC
  Filled 2023-02-15: qty 1

## 2023-02-15 MED ORDER — LORAZEPAM 2 MG/ML IJ SOLN: 0.0000 mg | Freq: Four times a day (QID) | INTRAMUSCULAR | Status: DC

## 2023-02-15 MED ORDER — THIAMINE HCL 100 MG/ML IJ SOLN
100.0000 mg | Freq: Once | INTRAMUSCULAR | Status: AC
  Administered 2023-02-15: 100 mg via INTRAVENOUS
  Filled 2023-02-15: qty 2

## 2023-02-15 NOTE — ED Triage Notes (Signed)
Patient arrives by EMS with complaint of headache for 2 days.  EMS reports glucose fingerstick of 45 initially, but came to 162 after two doses of oral glucose.  Patient has cirrhosis of the liver, and admits to drinking a few beers yesterday.  Patient is noncompliant with home medications.

## 2023-02-15 NOTE — Discharge Instructions (Signed)
As we discussed please limit your alcohol intake and increase your dietary intake of food.  Please follow-up with your doctor for recheck/reevaluation within the next 1 week.  Return to the emergency department for any symptom concerning to yourself.

## 2023-02-15 NOTE — ED Provider Notes (Addendum)
-----------------------------------------   9:10 AM on 02/15/2023 ----------------------------------------- Patient's labs have resulted showing hyponatremia with a sodium of 126, I reviewed the patient's historical labs he has had hyponatremia previously likely attributed to significant beer intake which the patient admits to.  Patient was hypoglycemic but that has corrected.  Discussed this with the daughter, she states that the patient does not eat much food at all I believe this is likely the cause for the mild hypoglycemic episode this morning.  Given the patient's hyponatremia and fall I did recommend admission to the hospital in fact encouraged it.  However patient adamantly wishes to go home.  He is agreeable to a liter of normal saline to see if we can increase the patient's sodium level slightly.  Patient states he wishes to go home afterwards.  Remainder the patient's workup shows a reassuring CBC, negative COVID/flu, troponin slightly elevated although unchanged after 2 hours.  Alcohol level of 133.  CT scan of the head shows no acute findings, chest x-ray is clear.  Will discharge with PCP follow-up.  Discussed with the patient need to increase his dietary intake.   Minna Antis, MD 02/15/23 [id number]    Minna Antis, MD 02/15/23 [phone]

## 2023-02-15 NOTE — ED Provider Notes (Signed)
[facility] Provider Note    Event Date/Time   First MD Initiated Contact with Patient 02/15/23 [phone]     (approximate)   History   Headache and Hypoglycemia   HPI  History obtained via patient and his daughter  Mark Carr is a 67 y.o. male brought to the ED via EMS from home with a chief complaint of frontal headache for 2 days.  EMS reports glucose of 45.  Improved to 160 2:02 doses of oral glucose.  Patient with a history of cirrhosis, bacteremia, acute encephalopathy.  No history of diabetes.  Daughter states that the only medicine he takes is B12.  Patient reports he fell 2 weeks ago.  Complains of frontal headache x 2 days.  Headache resolved by the time patient arrived to the emergency department.  Denies associated fever/chills, cough, chest pain, shortness of breath, abdominal pain, nausea, vomiting or dizziness.  Denies rectal bleeding.  Admits to drinking a few beers yesterday.     Past Medical History   Past Medical History:  Diagnosis Date        Acute encephalopathy 06/16/2021   Cirrhosis of liver    Coag negative Staphylococcus bacteremia 04/21/2021   Wrist fracture      Active Problem List   Patient Active Problem List   Diagnosis Date Noted   Chronic bilateral low back pain without sciatica 12/29/2022   Dark stools 12/01/2022   Frequent falls 07/15/2022   Generally unsteady 07/15/2022   Alcohol use disorder, moderate, dependence 01/29/2022   Thrombocytopenia 01/29/2022   Hypertension 01/29/2022   Memory loss or impairment 07/29/2021   PRES (posterior reversible encephalopathy syndrome) 07/29/2021   Essential hypertension 07/29/2021   Wernicke encephalopathy 06/16/2021   Alcoholic cirrhosis of liver without ascites 06/16/2021   Protein-calorie malnutrition, severe 04/02/2021   Generalized weakness    Tobacco use disorder 05/18/2019   Macrocytosis 05/18/2019   Steatosis of liver 05/18/2019   Chronic fatigue 05/18/2019      Past Surgical History   Past Surgical History:  Procedure Laterality Date   WRIST FRACTURE SURGERY Right 2016     Home Medications   Prior to Admission medications   Medication Sig Start Date End Date Taking? Authorizing Provider  amLODipine (NORVASC) 5 MG tablet TAKE ONE TABLET BY MOUTH DAILY 10/05/22   Erasmo DownerBacigalupo, Angela M, MD  CLENPIQ 10-3.5-12 MG-GM -GM/175ML SOLN Take by mouth. 06/05/22   [provider]  famotidine (PEPCID) 20 MG tablet Take 1 tablet (20 mg total) by mouth daily. Patient taking differently: Take 20 mg by mouth as needed. 06/01/22   Caro Larocheumball, Alison M, DO  labetalol (NORMODYNE) 200 MG tablet TAKE ONE TABLET BY MOUTH TWICE A DAY 02/18/22   Bacigalupo, Marzella SchleinAngela M, MD  Multiple Vitamin (MULTIVITAMIN WITH MINERALS) TABS tablet Take 1 tablet by mouth daily. 06/21/21   Ghimire, Werner LeanShanker M, MD  thiamine 100 MG tablet Take 1 tablet (100 mg total) by mouth daily. 06/01/22   Caro Larocheumball, Alison M, DO     Allergies  Patient has no known allergies.   Family History   Family History  Problem Relation Age of Onset   Hypertension Mother    Deep vein thrombosis Mother    Cancer Sister        unknown type   Heart disease Brother    Cancer Maternal Grandmother      Physical Exam  Triage Vital Signs: ED Triage Vitals  Enc Vitals Group     BP 02/15/23 0610  129/82     Pulse Rate 02/15/23 0610 (!) 107     Resp 02/15/23 0610 17     Temp 02/15/23 0610 97.8 F (36.6 C)     Temp Source 02/15/23 0610 Oral     SpO2 02/15/23 0610 98 %     Weight 02/15/23 0613 127 lb 1.6 oz (57.7 kg)     Height 02/15/23 0613 5\' 8"  (1.727 m)     Head Circumference --      Peak Flow --      Pain Score 02/15/23 0614 0     Pain Loc --      Pain Edu? --      Excl. in GC? --     Updated Vital Signs: BP 129/82   Pulse (!) 107   Temp 97.8 F (36.6 C) (Oral)   Resp 17   Ht 5\' 8"  (1.727 m)   Wt 57.7 kg   SpO2 98%   BMI 19.33 kg/m    General: Awake, no distress.   Cachectic. CV:  Mildly tachycardic.  Good peripheral perfusion.  Resp:  Normal effort.  CTAB. Abd:  Hepatomegaly.  Nontender.  No distention.  Other:  No truncal vesicles.  Alert and oriented x 3.  CN II-XII grossly intact.  5/5 motor strength and sensation all extremities.  M AE x 4.  Tremulous.   ED Results / Procedures / Treatments  Labs (all labs ordered are listed, but only abnormal results are displayed) Labs Reviewed  COMPREHENSIVE METABOLIC PANEL - Abnormal; Notable for the following components:      Result Value   Sodium 126 (*)    Chloride 89 (*)    CO2 19 (*)    Calcium 8.6 (*)    Albumin 3.0 (*)    AST 258 (*)    ALT 80 (*)    Total Bilirubin 5.7 (*)    Anion gap 18 (*)    All other components within normal limits  ETHANOL - Abnormal; Notable for the following components:   Alcohol, Ethyl (B) 133 (*)    All other components within normal limits  PROTIME-INR - Abnormal; Notable for the following components:   Prothrombin Time 19.2 (*)    INR 1.6 (*)    All other components within normal limits  AMMONIA - Abnormal; Notable for the following components:   Ammonia 45 (*)    All other components within normal limits  CBG MONITORING, ED - Abnormal; Notable for the following components:   Glucose-Capillary 112 (*)    All other components within normal limits  TROPONIN I (HIGH SENSITIVITY) - Abnormal; Notable for the following components:   Troponin I (High Sensitivity) 19 (*)    All other components within normal limits  RESP PANEL BY RT-PCR (RSV, FLU A&B, COVID)  RVPGX2  LIPASE, BLOOD  CBC WITH DIFFERENTIAL/PLATELET  URINALYSIS, ROUTINE W REFLEX MICROSCOPIC  CBG MONITORING, ED     EKG  ED ECG REPORT I, Kasaundra Fahrney J, the attending physician, personally viewed and interpreted this ECG.   Date: 02/15/2023  EKG Time: 0613  Rate: 104  Rhythm: sinus tachycardia  Axis: Normal  Intervals:none  ST&T Change: Nonspecific    RADIOLOGY  Chest x-ray and CT head  pending   Official radiology report(s): DG Chest Port 1 View  Result Date: 02/15/2023 CLINICAL DATA:  Hypoglycemia. Complains of headache for 2 days. History of cirrhosis. EXAM: PORTABLE CHEST 1 VIEW COMPARISON:  07/17/2022 FINDINGS: Normal heart size. No pleural fluid or airspace disease.  The visualized osseous structures are unremarkable. IMPRESSION: No acute cardiopulmonary abnormalities. Electronically Signed   By: Signa Kell M.D.   On: 02/15/2023 06:55     PROCEDURES:  Critical Care performed: No  .1-3 Lead EKG Interpretation  Performed by: Irean Hong, MD Authorized by: Irean Hong, MD     Interpretation: abnormal     ECG rate:  105   ECG rate assessment: tachycardic     Rhythm: sinus tachycardia     Ectopy: none     Conduction: normal   Comments:     Patient placed on cardiac monitor to evaluate for arrhythmias    MEDICATIONS ORDERED IN ED: Medications  thiamine (VITAMIN B1) tablet 100 mg (has no administration in time range)  LORazepam (ATIVAN) injection 0-4 mg ( Intravenous Not Given 02/15/23 0634)     IMPRESSION / MDM / ASSESSMENT AND PLAN / ED COURSE  I reviewed the triage vital signs and the nursing notes.                             67 year old male with alcohol dependency and cirrhosis presenting with frontal headache x 2 days and hypoglycemia. Differential diagnosis includes, but is not limited to, intracranial hemorrhage, meningitis/encephalitis, previous head trauma, cavernous venous thrombosis, tension headache, temporal arteritis, migraine or migraine equivalent, idiopathic intracranial hypertension, and non-specific headache.  I personally reviewed patient's records and note a palliative care home visit on 02/03/2023.  Patient's presentation is most consistent with acute presentation with potential threat to life or bodily function.  The patient is on the cardiac monitor to evaluate for evidence of arrhythmia and/or significant heart rate  changes.  Will obtain cardiac panel to include LFTs/lipase, EtOH, ammonia level.  CT head, chest x-ray, respiratory panel.  Recheck blood sugar.  Placed on CIWA.  Wrapped with warm blankets.  Will reassess.  Clinical Course as of 02/15/23 0706  Mon Feb 15, 2023  1610 FSBS 112 [JS]  0705 Care transferred to Dr. Lenard Lance at change of shift pending labwork, imaging and reassessment. [JS]    Clinical Course User Index [JS] Irean Hong, MD     FINAL CLINICAL IMPRESSION(S) / ED DIAGNOSES   Final diagnoses:  Acute nonintractable headache, unspecified headache type  Hypoglycemia  Alcoholic intoxication without complication     Rx / DC Orders   ED Discharge Orders     None        Note:  This document was prepared using Dragon voice recognition software and may include unintentional dictation errors.   Irean Hong, MD 02/15/23 [phone]

## 2023-02-18 ENCOUNTER — Telehealth: Payer: Self-pay

## 2023-02-18 NOTE — Telephone Encounter (Signed)
        Patient  visited [facility] on 4/8    Telephone encounter attempt :  1st  A HIPAA compliant voice message was left requesting a return call.  Instructed patient to call back .    Emileo [facility]el [facility] Guide,  [phone] [address], [place], [place] [address] Phone: [phone] Email: [email or web address]       

## 2023-02-18 NOTE — Telephone Encounter (Signed)
        Patient  visited [facility] on 4/8    Telephone encounter attempt :  2nd  A HIPAA compliant voice message was left requesting a return call.  Instructed patient to call back    Lenard Forth [facility] Guide, [facility] [phone] [address] [place], [place], [place] [address] Phone: [phone] Email: [email or web address]

## 2023-03-09 ENCOUNTER — Other Ambulatory Visit: Payer: Medicare Other

## 2023-03-09 DIAGNOSIS — Z515 Encounter for palliative care: Secondary | ICD-10-CM

## 2023-03-09 NOTE — Progress Notes (Signed)
COMMUNITY PALLIATIVE CARE SW NOTE  PATIENT NAME: Mark Carr DOB: 1955-11-20 MRN: [id number]  PRIMARY CARE PROVIDER: Erasmo Downer, MD  RESPONSIBLE PARTY:  Acct ID - Guarantor Home Phone Work Phone Relationship Acct Type  [id number] MELROY, BOUGHER* [phone] Self P/F     [address] [place], [place] [address]     PLAN OF CARE and INTERVENTIONS:              Palliative care encounter: PC SW completed f/u visit with with patient, patients spouse and daughter.   Hospital/ED visit: patient had an ED visits on 4/8 due to having a headache that lasted for 2 days. Patients sodium was levels were low and alcohol levels were high. Patient was advised to be admitted but declined. Patient was instructed to increase food intake. No falls reported. Family share that patient still has bouts of confusion some during the day.  Appetite: patient his appetite has increased and he has gained 3lbs.   Pain: patient and family report inconsistent pain in R shoulder. Patient uses Biofreeze and Aspercreme when needed.   Medications: patient continues to be noncompliant with medications. Education provided. Patient shared that he will start back taking Thiamine (B1).   Cirrhosis: patient continues to drinking and shares that he would like to slow down drinking at some point, however patients historical actions/behaviors does not support this.   ACD: patient/family have completed POA documents. Patient and family to still review MOST form.   Follow up: patient is scheduled to GI for his initial/new patient visit on 5/20. PC will f/u after this visit.      SOCIAL HX:  Social History   Tobacco Use   Smoking status: Every Day    Packs/day: 0.50    Years: 40.00    Additional pack years: 0.00    Total pack years: 20.00    Types: Cigarettes   Smokeless tobacco: Never  Substance Use Topics   Alcohol use: Yes    Alcohol/week: 4.0 standard drinks of alcohol    Types: 4 Cans of beer  per week    Comment: 4 beers a day    CODE STATUS: FULL CODE  ADVANCED DIRECTIVES: Y MOST FORM COMPLETE: N. Patient has form HOSPICE EDUCATION PROVIDED: N  PPS:50%   Time spent: 40 min   [place], [place]

## 2023-03-25 ENCOUNTER — Ambulatory Visit (INDEPENDENT_AMBULATORY_CARE_PROVIDER_SITE_OTHER): Payer: Medicare Other | Admitting: Gastroenterology

## 2023-03-25 ENCOUNTER — Encounter: Payer: Self-pay | Admitting: Gastroenterology

## 2023-03-25 ENCOUNTER — Emergency Department
Admission: EM | Admit: 2023-03-25 | Discharge: 2023-03-25 | Disposition: A | Discharge status: Home / Self Care | Payer: Medicare Other | Attending: Emergency Medicine | Admitting: Emergency Medicine

## 2023-03-25 VITALS — BP 105/69 | HR 125 | Temp 98.7°F | Ht 68.0 in | Wt 123.0 lb

## 2023-03-25 DIAGNOSIS — D696 Thrombocytopenia, unspecified: Secondary | ICD-10-CM

## 2023-03-25 DIAGNOSIS — R000 Tachycardia, unspecified: Secondary | ICD-10-CM | POA: Diagnosis not present

## 2023-03-25 DIAGNOSIS — E162 Hypoglycemia, unspecified: Secondary | ICD-10-CM

## 2023-03-25 DIAGNOSIS — K7030 Alcoholic cirrhosis of liver without ascites: Secondary | ICD-10-CM | POA: Diagnosis not present

## 2023-03-25 DIAGNOSIS — R55 Syncope and collapse: Secondary | ICD-10-CM | POA: Diagnosis not present

## 2023-03-25 LAB — CBC
HCT: 33.5 % — ABNORMAL LOW (ref 39.0–52.0)
Hemoglobin: 11.6 g/dL — ABNORMAL LOW (ref 13.0–17.0)
MCH: 36.8 pg — ABNORMAL HIGH (ref 26.0–34.0)
MCHC: 34.6 g/dL (ref 30.0–36.0)
MCV: 106.3 fL — ABNORMAL HIGH (ref 80.0–100.0)
Platelets: 31 10*3/uL — ABNORMAL LOW (ref 150–400)
RBC: 3.15 MIL/uL — ABNORMAL LOW (ref 4.22–5.81)
RDW: 13.4 % (ref 11.5–15.5)
WBC: 3 10*3/uL — ABNORMAL LOW (ref 4.0–10.5)
nRBC: 0 % (ref 0.0–0.2)

## 2023-03-25 LAB — CBG MONITORING, ED: Glucose-Capillary: 79 mg/dL (ref 70–99)

## 2023-03-25 LAB — BASIC METABOLIC PANEL
Anion gap: 12 (ref 5–15)
BUN: 7 mg/dL — ABNORMAL LOW (ref 8–23)
CO2: 23 mmol/L (ref 22–32)
Calcium: 8.7 mg/dL — ABNORMAL LOW (ref 8.9–10.3)
Chloride: 97 mmol/L — ABNORMAL LOW (ref 98–111)
Creatinine, Ser: 0.76 mg/dL (ref 0.61–1.24)
GFR, Estimated: >60 mL/min (ref >60)
Glucose, Bld: 87 mg/dL (ref 70–99)
Potassium: 3.5 mmol/L (ref 3.5–5.1)
Sodium: 132 mmol/L — ABNORMAL LOW (ref 135–145)

## 2023-03-25 MED ORDER — LACTULOSE 10 GM/15ML PO SOLN: 30.0000 g | Freq: Two times a day (BID) | ORAL | 0 refills | Status: DC

## 2023-03-25 NOTE — Progress Notes (Signed)
Wyline Mood MD, MRCP(U.K) [address]  [address]  [place], [place] [address]  Main: [phone]  Fax: [phone]   Gastroenterology Consultation  Referring Provider:     Erasmo Downer, MD Primary Care Physician:  Erasmo Downer, MD Primary Gastroenterologist:  Dr. Wyline Mood  Reason for Consultation:     Liver cirrhosis         HPI:   Mark Carr is a 67 y.o. y/o male referred for consultation & management  by Dr. Beryle Flock, Marzella Schlein, MD.    He is here today in my office with his daughter and wife to discuss about his liver disease.  He drinks alcohol all night long and first thing in the morning for many years.  History of incarceration many years back.  History of cocaine use many years back.  Per family drinks all night long has difficulty sleeping.  Never seen by hepatologist.  Never had an upper endoscopy.  He drinks for no particular reason never sought help to stop drinking alcohol.  12/28/2022 CT scan abdomen pelvis with contrast shows cirrhosis with severe hepatic steatosis and varices. 02/15/2023: Hemoglobin 11.6 g, MCV 105, platelets 42, albumin 3 alkaline phosphatase 115 total bilirubin 5.7 blood has been positive with ethanol as repeat checks over the past 5 months.  INR 1.6. MELD of 24 in April 2024  He has had multiple visits to the emergency room in 2024 with alcoholic intoxication weakness.  He has been seen by palliative care on 03/09/2023   No prior endoscopy reports available. Past Medical History:  Diagnosis Date        Acute encephalopathy 06/16/2021   Cirrhosis of liver (HCC)    Coag negative Staphylococcus bacteremia 04/21/2021   Wrist fracture     Past Surgical History:  Procedure Laterality Date   WRIST FRACTURE SURGERY Right 2016    Prior to Admission medications   Medication Sig Start Date End Date Taking? Authorizing Provider  amLODipine (NORVASC) 5 MG tablet TAKE ONE TABLET BY MOUTH DAILY 10/05/22   Erasmo Downer,  MD  CLENPIQ 10-3.5-12 MG-GM -GM/175ML SOLN Take by mouth. 06/05/22   [provider]  famotidine (PEPCID) 20 MG tablet Take 1 tablet (20 mg total) by mouth daily. Patient taking differently: Take 20 mg by mouth as needed. 06/01/22   Caro Laroche, DO  labetalol (NORMODYNE) 200 MG tablet TAKE ONE TABLET BY MOUTH TWICE A DAY 02/18/22   Bacigalupo, Marzella Schlein, MD  Multiple Vitamin (MULTIVITAMIN WITH MINERALS) TABS tablet Take 1 tablet by mouth daily. 06/21/21   Ghimire, Werner Lean, MD  thiamine 100 MG tablet Take 1 tablet (100 mg total) by mouth daily. 06/01/22   Caro Laroche, DO    Family History  Problem Relation Age of Onset   Hypertension Mother    Deep vein thrombosis Mother    Cancer Sister        unknown type   Heart disease Brother    Cancer Maternal Grandmother      Social History   Tobacco Use   Smoking status: Every Day    Packs/day: 0.50    Years: 40.00    Additional pack years: 0.00    Total pack years: 20.00    Types: Cigarettes   Smokeless tobacco: Never  Vaping Use   Vaping Use: Never used  Substance Use Topics   Alcohol use: Yes    Alcohol/week: 4.0 standard drinks of alcohol    Types: 4 Cans  of beer per week    Comment: 4 beers a day   Drug use: Not Currently    Allergies as of 03/25/2023   (No Known Allergies)    Review of Systems:    All systems reviewed and negative except where noted in HPI.   Physical Exam:  BP 105/69   Pulse (!) 125 Comment: smoked a cigarette before coming in the office  Temp 98.7 F (37.1 C) (Oral)   Ht 5\' 8"  (1.727 m)   Wt 123 lb (55.8 kg)   BMI 18.70 kg/m  No LMP for male patient. Psych:  Alert and cooperative. Normal mood and affect. General:   Alert,  Well-developed, well-nourished, pleasant and cooperative in NAD Head:  Normocephalic and atraumatic. Eyes:  Sclera clear, no icterus.   Conjunctiva pink. Ears:  Normal auditory acuity. Neck:  Supple; no masses or thyromegaly. Lungs:  Respirations even and  unlabored.  Clear throughout to auscultation.   No wheezes, crackles, or rhonchi. No acute distress. Heart:  Regular rate and rhythm; no murmurs, clicks, rubs, or gallops. Abdomen:  Normal bowel sounds.  No bruits.  Soft, non-tender and non-distended without masses, hepatosplenomegaly or hernias noted.  No guarding or rebound tenderness.    Neurologic:  Alert and oriented x3;  grossly normal neurologically. Psych:  Alert and cooperative. Normal mood and affect.  After I came into the room I was rushed to go back inside by the staff apparently he kind of passed out but quickly came back.  Pulses present throughout the time.  He was hypotensive with blood pressure around 90 systolic a bit confused we do not have a glucose taken in the office hence we gave him a packet of sugar to eat following which he felt much better and looked brighter.  EMS was called and they came and assessed him.  Complaining of some abdominal pain but no distention noted no tenderness on palpation.  Neurologically ANO x 3 moving all 4 limbs.  Cranial nerves intact  Imaging Studies: No results found.  Assessment and Plan:   Mark Carr is a 67 y.o. y/o male has been referred for history of liver cirrhosis seen on imaging attributed to chronic alcohol use.  No ascites, no history of variceal bleeds.  Multiple emergency room visits for the same palliative care has been involved.   Plan 1.  From the GI point of view the only treatment for alcoholic liver cirrhosis is abstinence from alcohol, good nutrition.  He has to seek help from counseling, alcoholic Anonymous.  I have clearly explained to him if he continues to drink alcohol that he has a high risk of death within the next 1 to 2 years.  There is no magic pill or treatment to help him apart from abstinence and good nutrition.  2.  Would require right upper quadrant ultrasound every 6 months along with AFP to screen for [facility] next imaging would be in 06/29/2023  3.  If he  demonstrates some commitment to abstinence we could refer him for liver transplant evaluation as his MELD is 24 but at this point of time do not believe he would benefit from evaluation due to noncommitment to treatment plans seen noted in his chart  4.  Recommend upper endoscopy to screen for esophageal varices.  Will check a CBC if platelet count is less than 50 cannot perform upper endoscopy till it improves.  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  5.  Full autoimmune and viral hepatitis workup  6.  Possibly had an episode of hypoglycemia in the office when he had a presyncopal episode.  He will be taken to the emergency room by EMS for further evaluation.  I stayed in the room till EMS arrived.  He had some abdominal discomfort but no tenderness on palpation. 7.  Lactulose for possible minimal hepatic encephalopathy has he has difficulty sleeping at night  I have discussed alternative options, risks & benefits,  which include, but are not limited to, bleeding, infection, perforation,respiratory complication & drug reaction.  The patient agrees with this plan & written consent will be obtained.     A total of16minutes was spent on this visit reviewing previous notes, counseling the patient on alcohol cessation chronic liver disease, ordering tests follow autoimmune and viral hepatitis workup, adjusting meds, and documenting the findings in the note.  Follow up in 3 months Dr Wyline Mood MD,MRCP(U.K)

## 2023-03-25 NOTE — Discharge Instructions (Signed)
Make you eat consistently to avoid having low blood sugar.  Please refer to the attached documentation for resources to help with your alcohol use.  If you have any new, worsening or unusual or unexpected symptoms call your doctor right away or return to the emergency department for reevaluation.

## 2023-03-25 NOTE — Addendum Note (Signed)
Addended by: Adela Ports on: 03/25/2023 02:45 PM   Modules accepted: Orders

## 2023-03-25 NOTE — ED Provider Notes (Signed)
[facility] Provider Note    Event Date/Time   First MD Initiated Contact with Patient 03/25/23 1549     (approximate)   History   Near Syncope   HPI  Mark Carr is a 67 y.o. male   Past medical history of daily alcohol use, liver cirrhosis, here for presyncopal episode while at GI clinic today and was found to be hypoglycemic.  He ate some food and then afterwards got his blood sugar checked in the 70s.  Feels markedly better wants to go home now no other acute complaints.  Has otherwise been in his regular state of health.  Denies any recent illnesses.  Did not fully pass out.  Did not sustain any injuries.  Denies chest pain palpitation shortness of breath or any other symptoms related.  Today he went to his appointment without eating as much as he normally does for breakfast and lunch.  Independent Historian contributed to assessment above: Family members wife and daughter at bedside to corroborate information given above.      Physical Exam   Triage Vital Signs: ED Triage Vitals  Enc Vitals Group     BP 03/25/23 1413 122/82     Pulse Rate 03/25/23 1413 (!) 105     Resp 03/25/23 1413 16     Temp 03/25/23 1413 98 F (36.7 C)     Temp Source 03/25/23 1413 Oral     SpO2 03/25/23 1413 94 %     Weight 03/25/23 1414 122 lb 12.7 oz (55.7 kg)     Height 03/25/23 1414 5\' 8"  (1.727 m)     Head Circumference --      Peak Flow --      Pain Score 03/25/23 1414 0     Pain Loc --      Pain Edu? --      Excl. in GC? --     Most recent vital signs: Vitals:   03/25/23 1413  BP: 122/82  Pulse: (!) 105  Resp: 16  Temp: 98 F (36.7 C)  SpO2: 94%    General: Awake, no distress.  CV:  Good peripheral perfusion.  Resp:  Normal effort.  Abd:  No distention.  Other:  Awake alert comfortable mild tachycardia otherwise vital signs within normal limits.  Skin appears warm well-perfused.  Neck supple with full range of motion.  No obvious signs of  trauma.  No signs of acute intoxication or withdrawal at this time.   ED Results / Procedures / Treatments   Labs (all labs ordered are listed, but only abnormal results are displayed) Labs Reviewed  BASIC METABOLIC PANEL - Abnormal; Notable for the following components:      Result Value   Sodium 132 (*)    Chloride 97 (*)    BUN 7 (*)    Calcium 8.7 (*)    All other components within normal limits  CBC - Abnormal; Notable for the following components:   WBC 3.0 (*)    RBC 3.15 (*)    Hemoglobin 11.6 (*)    HCT 33.5 (*)    MCV 106.3 (*)    MCH 36.8 (*)    Platelets 31 (*)    All other components within normal limits  CBG MONITORING, ED     I ordered and reviewed the above labs they are notable for repeat blood sugar in the 70s and 80s.  EKG  ED ECG REPORT I, Pilar Jarvis, the attending physician, personally viewed  and interpreted this ECG.   Date: 03/25/2023  EKG Time: 1418  Rate: 105  Rhythm: sinus tachycardia  Axis: nl  Intervals:none  ST&T Change: No STEMI   PROCEDURES:  Critical Care performed: No  Procedures   MEDICATIONS ORDERED IN ED: Medications - No data to display  IMPRESSION / MDM / ASSESSMENT AND PLAN / ED COURSE  I reviewed the triage vital signs and the nursing notes.                                Patient's presentation is most consistent with acute presentation with potential threat to life or bodily function.  Differential diagnosis includes, but is not limited to, hypoglycemia, dehydration, electrolyte disturbance, dysrhythmia, cardiogenic syncope   The patient is on the cardiac monitor to evaluate for evidence of arrhythmia and/or significant heart rate changes.  MDM: This is a patient who skipped breakfast alcohol use and had a presyncopal episode that responded to p.o. intake and recheck blood sugar was normal.  He is asymptomatic now and had no preceding illnesses or other medical complaints, EKG shows no STEMI or dysrhythmia  changes.  I suspect this was due to hypoglycemia.  I encouraged him to continue with adequate p.o. intake and follow-up with his primary doctor.  I gave him resources for alcohol use treatment programs.       FINAL CLINICAL IMPRESSION(S) / ED DIAGNOSES   Final diagnoses:  Near syncope  Hypoglycemia     Rx / DC Orders   ED Discharge Orders     None        Note:  This document was prepared using Dragon voice recognition software and may include unintentional dictation errors.    Pilar Jarvis, MD 03/25/23 [phone]

## 2023-03-25 NOTE — ED Triage Notes (Signed)
Had near syncopal episode.  CBG checked, a pack of sugar given and recheck of blood sugar was 77.  Patient states "I am feeling a whole lot better than I was".  AAOx3.  Skin warm and dry. NAD

## 2023-03-29 ENCOUNTER — Ambulatory Visit: Payer: Medicare Other | Admitting: Gastroenterology

## 2023-03-30 ENCOUNTER — Telehealth: Payer: Self-pay

## 2023-03-30 NOTE — Telephone Encounter (Signed)
210 pm.  Message received through MyChart regarding patient status and information regarding hospice.  Connected with patient's spouse and she would like PC to make scheduled visit tomorrow at 2 pm.  Daughters will be present and they would like more information on hospice support.  Likely leaning towards hospice given patient's condition.

## 2023-03-31 ENCOUNTER — Other Ambulatory Visit: Payer: Medicare Other

## 2023-03-31 VITALS — BP 122/78 | HR 112 | Wt 123.0 lb

## 2023-03-31 DIAGNOSIS — Z515 Encounter for palliative care: Secondary | ICD-10-CM

## 2023-03-31 NOTE — Progress Notes (Unsigned)
COMMUNITY PALLIATIVE CARE SW NOTE  PATIENT NAME: Mark Carr DOB: January 18, 1956 MRN: [id number]  PRIMARY CARE PROVIDER: Erasmo Downer, MD  RESPONSIBLE PARTY:  Acct ID - Guarantor Home Phone Work Phone Relationship Acct Type  [id number] LOHITH, KIEHN* [phone] Self P/F     [address] [place], [place] [address]     PLAN OF CARE and INTERVENTIONS:              Palliative care encounter: follow up home visit completed with [facility] RN Raynelle Fanning, patient and patients spouse and 2 daughters.  Appetite: good per patient, family share that patient snacks. Weight is stable.  Falls: none reported.  Sleeping: pretty good.   Medication: lactualose and thiamine   Cirrohsis: patient saw liver specialist last week, at this appt patient had an hypoglycemic syncope episode. patient was started on lactulose 90ml BID this past friday, patient is taking it once a day. patient share that he has slacked up on drinking now drinking 3-4 beers a day instead of a 12 case of beer. patient shared that he does not intend to fully stop drinking and he is not interested in counseling support.  Hospice: Discussed in detail. open conversation with patient and family. patients endorses that he wishes to avoid further hospitalizations. Patient share that he relys on his family to assist with making hard decisons. Patient is aware of 1-2 yr or less life expectancy per his recent MD appointment and choice to not stop drinking. Emotional support reciporcal listening provided to patient and family during discussion. Patient and family would like to move forward with hospice referral at this time. PC also advised family to review/discuss code status with patient.     SOCIAL HX:  Social History   Tobacco Use   Smoking status: Every Day    Packs/day: 0.50    Years: 40.00    Additional pack years: 0.00    Total pack years: 20.00    Types: Cigarettes   Smokeless tobacco: Never  Substance Use Topics    Alcohol use: Yes    Alcohol/week: 4.0 standard drinks of alcohol    Types: 4 Cans of beer per week    Comment: 4 beers a day    CODE STATUS: FULL CODE. Discussed with family to reconsider to DNR. ADVANCED DIRECTIVES: Y MOST FORM COMPLETE:  N HOSPICE EDUCATION PROVIDED: Lenora Boys, LCSW

## 2023-03-31 NOTE — Progress Notes (Unsigned)
PATIENT NAME: Mark Carr DOB: Aug 10, 1956 MRN: [id number]  PRIMARY CARE PROVIDER: Erasmo Downer, MD  RESPONSIBLE PARTY:  Acct ID - Guarantor Home Phone Work Phone Relationship Acct Type  [id number] CAREL, BEAN* [phone] Self P/F     [address] [place], [place] [address]   Home visit completed patient, 2 daughters, wife and [place], [place].   Cirrhosis:  Taking lactulose only daily per patient.  Education provided on purpose   CODE STATUS: Full ADVANCED DIRECTIVES: Yes MOST FORM: No PPS: 50%   PHYSICAL EXAM:   VITALS: Today's Vitals   03/31/23 1416  BP: 122/78  Pulse: (!) 112  SpO2: 95%  Weight: 123 lb (55.8 kg)    LUNGS: {SYSTEM LUNGS ADULT/PED EXAM:21906} CARDIAC: {Mis exam cardio:32073}} *** EXTREMITIES: {Exam; extremity:10330} SKIN: {Findings; skin exam-one line:31329::"Skin color, texture, turgor normal. No rashes or lesions"}  NEURO: {Findings; ROS neuro:30532::"negative"}       Truitt Merle, RN

## 2023-04-01 ENCOUNTER — Telehealth: Payer: Self-pay

## 2023-04-01 ENCOUNTER — Telehealth: Payer: Self-pay | Admitting: Family Medicine

## 2023-04-01 NOTE — Telephone Encounter (Signed)
Left message for Raynelle Fanning advising that Dr. Leonard Schwartz will stay as attending.

## 2023-04-01 NOTE — Telephone Encounter (Signed)
Mark Carr with Hospice called asking for a Hospice referral and the family is asking if Dr. Leonard Schwartz will be his Attending physician while under hospice and write his orders or does she want the hospice doctors to manage his orders.  [phone]

## 2023-04-01 NOTE — Telephone Encounter (Signed)
Julie advised

## 2023-04-01 NOTE — Telephone Encounter (Signed)
The ultra sound will help [place] to him and how his liver is doing.  Prognosis cannot be provided until we have more information.  As I explained to him during his office visit if he continues to drink alcohol associated with a high rate of mortality.  If he gives up alcohol we can work towards trying to get him to a stable situation.

## 2023-04-01 NOTE — Telephone Encounter (Signed)
Ok to place hospice referral. I will stay as attending

## 2023-04-01 NOTE — Telephone Encounter (Signed)
Mark Carr asking you want the hospice doctors to take over comfort care. Please advise.

## 2023-04-01 NOTE — Telephone Encounter (Signed)
yes

## 2023-04-01 NOTE — Telephone Encounter (Signed)
Patient's daughter called stating that she saw that her father had an ultrasound scheduled for next week and labs needing to be drawn. However, she stated that the reality is that it would be almost impossible for her father to not have anything to drink (alcohol) before his ultrasound appointment. She stated that she would try for him to be fasting bit she knows that it would be a challenge. She wanted to know how important the ultrasound would be for the doctor. She also wanted to know if her father is on stage 4 liver diease and if he has a short time of life. She stated that when he was in the hospital, they gave her information about hospice for him but has not reached out yet until he has his ultrasound and maybe you would tell them something different. She just don't want to give up on her father and doesn't want to do the wrong thing for him. Please advise.

## 2023-04-02 ENCOUNTER — Telehealth: Payer: Self-pay

## 2023-04-02 NOTE — Telephone Encounter (Signed)
Transition Care Management Follow-up Telephone Call Date of discharge and from where: [facility] 5/16 How have you been since you were released from the hospital? Doing ok Any questions or concerns? No  Items Reviewed: Did the pt receive and understand the discharge instructions provided? Yes  Medications obtained and verified? Yes  Other? No  Any new allergies since your discharge? No  Dietary orders reviewed? No Do you have support at home? Yes     Follow up appointments reviewed:  PCP Hospital f/u appt confirmed? Yes  Scheduled to see  on next week @ . Specialist Hospital f/u appt confirmed? No  Scheduled to see  on  @ . Are transportation arrangements needed? No  If their condition worsens, is the pt aware to call PCP or go to the Emergency Dept.? Yes Was the patient provided with contact information for the PCP's office or ED? Yes Was to pt encouraged to call back with questions or concerns? Yes

## 2023-04-06 DIAGNOSIS — K7030 Alcoholic cirrhosis of liver without ascites: Secondary | ICD-10-CM

## 2023-04-06 NOTE — Telephone Encounter (Signed)
Please read patient message from 04/06/2023 as a follow up.

## 2023-04-07 ENCOUNTER — Ambulatory Visit
Admission: RE | Admit: 2023-04-07 | Discharge: 2023-04-07 | Disposition: A | Discharge status: Home / Self Care | Payer: Medicare Other | Source: Ambulatory Visit | Attending: Gastroenterology | Admitting: Gastroenterology

## 2023-04-07 ENCOUNTER — Other Ambulatory Visit: Payer: Self-pay

## 2023-04-07 ENCOUNTER — Other Ambulatory Visit: Payer: Medicare Other

## 2023-04-07 DIAGNOSIS — K7030 Alcoholic cirrhosis of liver without ascites: Secondary | ICD-10-CM | POA: Insufficient documentation

## 2023-04-07 DIAGNOSIS — I81 Portal vein thrombosis: Secondary | ICD-10-CM

## 2023-04-07 DIAGNOSIS — D696 Thrombocytopenia, unspecified: Secondary | ICD-10-CM | POA: Insufficient documentation

## 2023-04-07 DIAGNOSIS — Z515 Encounter for palliative care: Secondary | ICD-10-CM

## 2023-04-07 LAB — ANA: Anti Nuclear Antibody (ANA): NEGATIVE

## 2023-04-07 LAB — IRON,TIBC AND FERRITIN PANEL: Iron Saturation: 73 % — ABNORMAL HIGH (ref 15–55)

## 2023-04-07 LAB — IMMUNOGLOBULINS A/E/G/M, SERUM

## 2023-04-07 LAB — HEPATITIS B E ANTIGEN: Hep B E Ag: NEGATIVE

## 2023-04-07 LAB — HEPATITIS B CORE ANTIBODY, TOTAL: Hep B Core Total Ab: NEGATIVE

## 2023-04-07 LAB — HEPATITIS B SURFACE ANTIGEN: Hepatitis B Surface Ag: NEGATIVE

## 2023-04-07 LAB — HEPATITIS A ANTIBODY, TOTAL: hep A Total Ab: NEGATIVE

## 2023-04-07 LAB — CELIAC DISEASE AB SCREEN W/RFX: Transglutaminase IgA: <2 U/mL (ref 0–3)

## 2023-04-07 LAB — HEPATITIS B E ANTIBODY: Hep B E Ab: NONREACTIVE

## 2023-04-07 NOTE — Addendum Note (Signed)
Addended by: Adela Ports on: 04/07/2023 12:16 PM   Modules accepted: Orders

## 2023-04-07 NOTE — Progress Notes (Signed)
1627 Palliative Care Follow Up Encounter Note   PATIENT NAME: Mark Carr DOB: [birth year] MRN: [id number]  PRIMARY CARE PROVIDER: Erasmo Downer, MD  RESPONSIBLE PARTY:  Acct ID - Guarantor Home Phone Work Phone Relationship Acct Type  [id number] PLEZ, HUNDLEY* [phone] Self P/F     [address], [place], [place] [address]   I connected with patient, wife, and daughter on 04/07/23 by telephone and verified that I am speaking with the correct person using two identifiers.   I discussed the limitations of evaluation and management by telemedicine. The patient expressed understanding and agreed to proceed    HISTORY OF PRESENT ILLNESS:  67 y.o. male past medical history of daily alcohol use, liver cirrhosis, HTN, thrombocytopenia.   Daughter reports that he had u/s of abdomen today and "they saw where he wasn't getting good blood flow to like his liver so now he has to have a CT on Monday."   Pt denies any c/o pain or discomfort.  No other issues of hypoglycemic syncope noted since increasing his lactulose to twice a day. Pt states, he is "taking it ok".   Pt and family agreeable to frequent check in calls. Ensured they had contact info and requested that they call with any questions or concerns in the future. Voiced understanding.     Barbette Merino, RN

## 2023-04-07 NOTE — Progress Notes (Signed)
Can we also recheck his platelet count please

## 2023-04-08 LAB — HEPATITIS C ANTIBODY: Hep C Virus Ab: NONREACTIVE

## 2023-04-08 LAB — CELIAC DISEASE AB SCREEN W/RFX: Antigliadin Abs, IgA: 7 units (ref 0–19)

## 2023-04-08 LAB — IMMUNOGLOBULINS A/E/G/M, SERUM
IgA/Immunoglobulin A, Serum: 683 mg/dL — ABNORMAL HIGH (ref 61–437)
IgG (Immunoglobin G), Serum: 1877 mg/dL — ABNORMAL HIGH (ref 603–1613)
IgM (Immunoglobulin M), Srm: 124 mg/dL (ref 20–172)

## 2023-04-08 LAB — IRON,TIBC AND FERRITIN PANEL
Ferritin: 1332 ng/mL — ABNORMAL HIGH (ref 30–400)
Iron: 126 ug/dL (ref 38–169)
Total Iron Binding Capacity: 173 ug/dL — ABNORMAL LOW (ref 250–450)
UIBC: 47 ug/dL — ABNORMAL LOW (ref 111–343)

## 2023-04-08 LAB — HEPATITIS B SURFACE ANTIBODY,QUALITATIVE: Hep B Surface Ab, Qual: NONREACTIVE

## 2023-04-08 LAB — ANTI-MICROSOMAL ANTIBODY LIVER / KIDNEY: LKM1 Ab: 1.2 Units (ref 0.0–20.0)

## 2023-04-08 LAB — ALPHA-1-ANTITRYPSIN: A-1 Antitrypsin: 161 mg/dL (ref 101–187)

## 2023-04-08 LAB — CK: Total CK: 50 U/L (ref 41–331)

## 2023-04-08 LAB — CERULOPLASMIN: Ceruloplasmin: 22.7 mg/dL (ref 16.0–31.0)

## 2023-04-08 LAB — MITOCHONDRIAL/SMOOTH MUSCLE AB PNL
Mitochondrial Ab: <20 Units (ref 0.0–20.0)
Smooth Muscle Ab: 12 Units (ref 0–19)

## 2023-04-12 ENCOUNTER — Ambulatory Visit
Admission: RE | Admit: 2023-04-12 | Discharge: 2023-04-12 | Disposition: A | Discharge status: Home / Self Care | Payer: Medicare Other | Source: Ambulatory Visit | Attending: Gastroenterology | Admitting: Gastroenterology

## 2023-04-12 DIAGNOSIS — K7030 Alcoholic cirrhosis of liver without ascites: Secondary | ICD-10-CM | POA: Diagnosis present

## 2023-04-12 DIAGNOSIS — I81 Portal vein thrombosis: Secondary | ICD-10-CM | POA: Insufficient documentation

## 2023-04-12 MED ORDER — IOHEXOL 300 MG/ML  SOLN
100.0000 mL | Freq: Once | INTRAMUSCULAR | Status: AC | PRN
  Administered 2023-04-12: 100 mL via INTRAVENOUS

## 2023-04-14 ENCOUNTER — Telehealth: Payer: Self-pay | Admitting: Gastroenterology

## 2023-04-14 NOTE — Telephone Encounter (Signed)
Pt daughter denise called to get results of ct scan of abdomin please return call

## 2023-04-14 NOTE — Telephone Encounter (Signed)
Dr. Tobi Bastos, results are not back yet. However, once they are, can you please let me know what to tell patient/daughter. Thank you.

## 2023-04-15 NOTE — Telephone Encounter (Signed)
Called the CT Department and they stated that they have been so busy that maybe it got missed. I was told by [facility] to call back on Monday in case it had no been read.

## 2023-04-17 NOTE — Progress Notes (Signed)
No evidence of any blood vessels that are blocked , shows liver cirrhosis

## 2023-04-19 NOTE — Telephone Encounter (Signed)
I will send a message through MyChart so I could add a website for local AA meetings.

## 2023-04-19 NOTE — Telephone Encounter (Signed)
Patient's daughter-Denise called wanting results of her dad's CT Scan. I was able to let her know what she already know, her dad having cirrhosis of the liver. She asked if there was anything to do with her father since her dad is drinking heavily to the point that he walks to the store by himself to get the alcohol. They try to contain him in the house but the wife works and not able to keep an eye on him. [name] stated that he starts drinking from the morning until he passes out. Patient is not eating, instead he might just nibble on some things but not food. [name] wanted to know how much longer he might have with the rate of drink that he is on. They understand that he might be on stage 4 of liver damage but they don't know what else to do for him. [name] stated that she has an appointment with you until August but wasn't sure if he is needing to do something since he doesn't want to quit drinking or smoking.

## 2023-04-19 NOTE — Telephone Encounter (Signed)
Mark Carr  Nothing we can do other than to say he needs to stop drinking . Can you send some resources for the family such as AA and any other conselling resources.   Will C/c Bacigalupo, Marzella Schlein, MD to see if they can help. Nothing particularly GI related I can suggest more at this point oher than to stop drinking

## 2023-04-21 NOTE — Telephone Encounter (Signed)
Mark Carr   Inform patient that if he comes in August I will be stressing on importance of stopping alcohol consumption as my main recommendation . Purpose of visit would be to reiterate its importance.  C/c Bacigalupo, Marzella Schlein, MD   Dr Wyline Mood MD,MRCP [facility]) Gastroenterology/Hepatology Pager: [phone]

## 2023-04-29 ENCOUNTER — Telehealth: Payer: Self-pay

## 2023-04-29 NOTE — Telephone Encounter (Signed)
PC SW received multiple calls from patients family stating some changes in status in patient such as pain, poor sleep hygiene, and frequent hiccups. Family requested a visit from [facility]. SW advised family.spouse that unfortunately PC can not visit prior to scheduled visit on 7/3. Family advised to call non emergency EMS to assess patient at home, due to patient declining to go to the hospital.

## 2023-05-11 ENCOUNTER — Other Ambulatory Visit: Payer: Medicare Other

## 2023-05-18 ENCOUNTER — Emergency Department
Admission: EM | Admit: 2023-05-18 | Discharge: 2023-05-19 | Discharge status: Against Medical Advice | Payer: Medicare Other | Attending: Emergency Medicine | Admitting: Emergency Medicine

## 2023-05-18 ENCOUNTER — Other Ambulatory Visit: Payer: Self-pay

## 2023-05-18 DIAGNOSIS — Z5321 Procedure and treatment not carried out due to patient leaving prior to being seen by health care provider: Secondary | ICD-10-CM | POA: Diagnosis not present

## 2023-05-18 DIAGNOSIS — R109 Unspecified abdominal pain: Secondary | ICD-10-CM | POA: Insufficient documentation

## 2023-05-18 LAB — COMPREHENSIVE METABOLIC PANEL
ALT: 32 U/L (ref 0–44)
AST: 97 U/L — ABNORMAL HIGH (ref 15–41)
Albumin: 3.1 g/dL — ABNORMAL LOW (ref 3.5–5.0)
Alkaline Phosphatase: 144 U/L — ABNORMAL HIGH (ref 38–126)
Anion gap: 14 (ref 5–15)
BUN: 9 mg/dL (ref 8–23)
CO2: 21 mmol/L — ABNORMAL LOW (ref 22–32)
Calcium: 8.9 mg/dL (ref 8.9–10.3)
Chloride: 103 mmol/L (ref 98–111)
Creatinine, Ser: 0.84 mg/dL (ref 0.61–1.24)
GFR, Estimated: >60 mL/min (ref >60)
Glucose, Bld: 158 mg/dL — ABNORMAL HIGH (ref 70–99)
Potassium: 3.7 mmol/L (ref 3.5–5.1)
Sodium: 138 mmol/L (ref 135–145)
Total Bilirubin: 1.7 mg/dL — ABNORMAL HIGH (ref 0.3–1.2)
Total Protein: 7.2 g/dL (ref 6.5–8.1)

## 2023-05-18 LAB — CBC
HCT: 35.7 % — ABNORMAL LOW (ref 39.0–52.0)
Hemoglobin: 12.5 g/dL — ABNORMAL LOW (ref 13.0–17.0)
MCH: 37.4 pg — ABNORMAL HIGH (ref 26.0–34.0)
MCHC: 35 g/dL (ref 30.0–36.0)
MCV: 106.9 fL — ABNORMAL HIGH (ref 80.0–100.0)
Platelets: 87 10*3/uL — ABNORMAL LOW (ref 150–400)
RBC: 3.34 MIL/uL — ABNORMAL LOW (ref 4.22–5.81)
RDW: 15.4 % (ref 11.5–15.5)
WBC: 3.1 10*3/uL — ABNORMAL LOW (ref 4.0–10.5)
nRBC: 0 % (ref 0.0–0.2)

## 2023-05-18 LAB — LIPASE, BLOOD: Lipase: 36 U/L (ref 11–51)

## 2023-05-18 NOTE — ED Triage Notes (Signed)
Pt to ED for back pain and bilateral upper abdominal pain 2/10 since several days, dull and nagging. Pt has had hiccups since yesterday and has been coughing also since yesterday.No cough noted in triage.  Hx cirrhosis

## 2023-05-18 NOTE — ED Provider Triage Note (Signed)
Emergency Medicine Provider Triage Evaluation Note  VANESSA GUNKEL , a 67 y.o. male  was evaluated in triage.  Pt complains of abdominal pain, hx of cirrhosis.  Review of Systems  Positive:  Negative:   Physical Exam  BP (!) 127/111 (BP Location: Left Arm)   Pulse (!) 124   Temp 98.4 F (36.9 C) (Oral)   Resp 20   Ht 5\' 8"  (1.727 m)   Wt 55.8 kg   SpO2 97%   BMI 18.70 kg/m  Gen:   Awake, no distress   Resp:  Normal effort  MSK:   Moves extremities without difficulty  Other:   Medical Decision Making  Medically screening exam initiated at 5:05 PM.  Appropriate orders placed.  [name] was informed that the remainder of the evaluation will be completed by another provider, this initial triage assessment does not replace that evaluation, and the importance of remaining in the ED until their evaluation is complete.     Faythe Ghee, PA-C 05/18/23 1706

## 2023-05-25 ENCOUNTER — Telehealth: Payer: Self-pay | Admitting: *Deleted

## 2023-05-25 NOTE — Telephone Encounter (Signed)
Transition Care Management Unsuccessful Follow-up Telephone Call  Date of discharge and from where:  [facility] 05/19/2023  Attempts:  1st Attempt patient answered but just had blood drawn and then left after a extreme wait   Reason for unsuccessful TCM follow-up call:  patient left with out being seen but has an appt to follow up with his pcp

## 2023-06-13 ENCOUNTER — Other Ambulatory Visit: Payer: Self-pay

## 2023-06-13 ENCOUNTER — Inpatient Hospital Stay: Payer: Medicare Other

## 2023-06-13 ENCOUNTER — Emergency Department: Payer: Medicare Other

## 2023-06-13 ENCOUNTER — Encounter: Payer: Self-pay | Admitting: Radiology

## 2023-06-13 ENCOUNTER — Observation Stay
Admission: EM | Admit: 2023-06-13 | Discharge: 2023-06-14 | Disposition: A | Discharge status: Home / Self Care | Payer: Medicare Other | Attending: Internal Medicine | Admitting: Internal Medicine

## 2023-06-13 DIAGNOSIS — R4182 Altered mental status, unspecified: Principal | ICD-10-CM | POA: Insufficient documentation

## 2023-06-13 DIAGNOSIS — G9341 Metabolic encephalopathy: Secondary | ICD-10-CM | POA: Diagnosis not present

## 2023-06-13 DIAGNOSIS — K7682 Hepatic encephalopathy: Secondary | ICD-10-CM

## 2023-06-13 DIAGNOSIS — F10931 Alcohol use, unspecified with withdrawal delirium: Secondary | ICD-10-CM

## 2023-06-13 DIAGNOSIS — K7030 Alcoholic cirrhosis of liver without ascites: Secondary | ICD-10-CM | POA: Diagnosis present

## 2023-06-13 DIAGNOSIS — F10231 Alcohol dependence with withdrawal delirium: Secondary | ICD-10-CM | POA: Diagnosis present

## 2023-06-13 DIAGNOSIS — F17210 Nicotine dependence, cigarettes, uncomplicated: Secondary | ICD-10-CM | POA: Insufficient documentation

## 2023-06-13 DIAGNOSIS — I10 Essential (primary) hypertension: Secondary | ICD-10-CM | POA: Diagnosis not present

## 2023-06-13 DIAGNOSIS — D696 Thrombocytopenia, unspecified: Secondary | ICD-10-CM | POA: Diagnosis present

## 2023-06-13 DIAGNOSIS — R000 Tachycardia, unspecified: Secondary | ICD-10-CM

## 2023-06-13 DIAGNOSIS — E8729 Other acidosis: Secondary | ICD-10-CM | POA: Diagnosis present

## 2023-06-13 DIAGNOSIS — R1084 Generalized abdominal pain: Secondary | ICD-10-CM | POA: Diagnosis present

## 2023-06-13 DIAGNOSIS — R109 Unspecified abdominal pain: Secondary | ICD-10-CM

## 2023-06-13 DIAGNOSIS — F1020 Alcohol dependence, uncomplicated: Secondary | ICD-10-CM | POA: Diagnosis present

## 2023-06-13 LAB — COMPREHENSIVE METABOLIC PANEL
ALT: 39 U/L (ref 0–44)
AST: 108 U/L — ABNORMAL HIGH (ref 15–41)
Albumin: 3.4 g/dL — ABNORMAL LOW (ref 3.5–5.0)
Alkaline Phosphatase: 124 U/L (ref 38–126)
Anion gap: 16 — ABNORMAL HIGH (ref 5–15)
BUN: 6 mg/dL — ABNORMAL LOW (ref 8–23)
CO2: 22 mmol/L (ref 22–32)
Calcium: 8.6 mg/dL — ABNORMAL LOW (ref 8.9–10.3)
Chloride: 101 mmol/L (ref 98–111)
Creatinine, Ser: 0.64 mg/dL (ref 0.61–1.24)
GFR, Estimated: >60 mL/min (ref >60)
Glucose, Bld: 91 mg/dL (ref 70–99)
Potassium: 4.2 mmol/L (ref 3.5–5.1)
Sodium: 139 mmol/L (ref 135–145)
Total Bilirubin: 2.8 mg/dL — ABNORMAL HIGH (ref 0.3–1.2)
Total Protein: 8.1 g/dL (ref 6.5–8.1)

## 2023-06-13 LAB — URINALYSIS, ROUTINE W REFLEX MICROSCOPIC
Bacteria, UA: NONE SEEN
Bilirubin Urine: NEGATIVE
Glucose, UA: NEGATIVE mg/dL
Hgb urine dipstick: NEGATIVE
Ketones, ur: 5 mg/dL — AB
Nitrite: NEGATIVE
Protein, ur: NEGATIVE mg/dL
Specific Gravity, Urine: >1.046 — ABNORMAL HIGH (ref 1.005–1.030)
pH: 5 (ref 5.0–8.0)

## 2023-06-13 LAB — AMMONIA: Ammonia: 36 umol/L — ABNORMAL HIGH (ref 9–35)

## 2023-06-13 LAB — CBC
HCT: 40.1 % (ref 39.0–52.0)
Hemoglobin: 14.1 g/dL (ref 13.0–17.0)
MCH: 38.2 pg — ABNORMAL HIGH (ref 26.0–34.0)
MCHC: 35.2 g/dL (ref 30.0–36.0)
MCV: 108.7 fL — ABNORMAL HIGH (ref 80.0–100.0)
Platelets: 86 10*3/uL — ABNORMAL LOW (ref 150–400)
RBC: 3.69 MIL/uL — ABNORMAL LOW (ref 4.22–5.81)
RDW: 14.4 % (ref 11.5–15.5)
WBC: 3 10*3/uL — ABNORMAL LOW (ref 4.0–10.5)
nRBC: 0 % (ref 0.0–0.2)

## 2023-06-13 LAB — PROTIME-INR
INR: 1.4 — ABNORMAL HIGH (ref 0.8–1.2)
Prothrombin Time: 17.7 seconds — ABNORMAL HIGH (ref 11.4–15.2)

## 2023-06-13 LAB — LIPASE, BLOOD: Lipase: 28 U/L (ref 11–51)

## 2023-06-13 MED ORDER — FOLIC ACID 1 MG PO TABS
1.0000 mg | ORAL_TABLET | Freq: Once | ORAL | Status: AC
  Administered 2023-06-13: 1 mg via ORAL
  Filled 2023-06-13: qty 1

## 2023-06-13 MED ORDER — THIAMINE HCL 100 MG/ML IJ SOLN
100.0000 mg | Freq: Once | INTRAMUSCULAR | Status: AC
  Administered 2023-06-13: 100 mg via INTRAVENOUS
  Filled 2023-06-13: qty 2

## 2023-06-13 MED ORDER — THIAMINE HCL 100 MG/ML IJ SOLN: 100.0000 mg | Freq: Every day | INTRAMUSCULAR | Status: DC

## 2023-06-13 MED ORDER — LORAZEPAM 2 MG/ML IJ SOLN: 1.0000 mg | INTRAMUSCULAR | Status: DC | PRN

## 2023-06-13 MED ORDER — IOHEXOL 300 MG/ML  SOLN
100.0000 mL | Freq: Once | INTRAMUSCULAR | Status: AC | PRN
  Administered 2023-06-13: 100 mL via INTRAVENOUS

## 2023-06-13 MED ORDER — LACTATED RINGERS IV SOLN: INTRAVENOUS | Status: DC

## 2023-06-13 MED ORDER — LORAZEPAM 2 MG/ML IJ SOLN
2.0000 mg | Freq: Once | INTRAMUSCULAR | Status: AC
  Administered 2023-06-13: 2 mg via INTRAVENOUS
  Filled 2023-06-13: qty 1

## 2023-06-13 MED ORDER — LACTULOSE 10 GM/15ML PO SOLN
30.0000 g | Freq: Three times a day (TID) | ORAL | Status: DC
  Administered 2023-06-14: 30 g via ORAL
  Filled 2023-06-13: qty 60

## 2023-06-13 MED ORDER — ADULT MULTIVITAMIN W/MINERALS CH
1.0000 | ORAL_TABLET | Freq: Every day | ORAL | Status: DC
  Administered 2023-06-14: 1 via ORAL
  Filled 2023-06-13: qty 1

## 2023-06-13 MED ORDER — THIAMINE MONONITRATE 100 MG PO TABS
100.0000 mg | ORAL_TABLET | Freq: Every day | ORAL | Status: DC
  Administered 2023-06-14: 100 mg via ORAL
  Filled 2023-06-13: qty 1

## 2023-06-13 MED ORDER — FOLIC ACID 1 MG PO TABS
1.0000 mg | ORAL_TABLET | Freq: Every day | ORAL | Status: DC
  Administered 2023-06-14: 1 mg via ORAL
  Filled 2023-06-13: qty 1

## 2023-06-13 MED ORDER — LORAZEPAM 1 MG PO TABS
1.0000 mg | ORAL_TABLET | ORAL | Status: DC | PRN
  Administered 2023-06-14 (×2): 1 mg via ORAL
  Filled 2023-06-13 (×2): qty 1

## 2023-06-13 MED ORDER — LACTATED RINGERS IV BOLUS
1000.0000 mL | Freq: Once | INTRAVENOUS | Status: AC
  Administered 2023-06-13: 1000 mL via INTRAVENOUS

## 2023-06-13 NOTE — H&P (Signed)
History and Physical    Patient: Mark Carr DOB: [birth year] DOA: 06/13/2023 DOS: the patient was seen and examined on 06/13/2023 PCP: Erasmo Downer, MD  Patient coming from: Home  Chief Complaint:  Chief Complaint  Patient presents with   Abdominal Pain    HPI: Mark Carr is a 67 y.o. male with medical history significant for Moderate alcohol use disorder, hepatic cirrhosis on lactulose, last hospitalized in 2022 with acute encephalopathy secondary to PRES seen on MRI with negative EEG at the time and UDS positive benzodiazepine, who presents to the ED with acute worsening of chronic abdominal pain over the past 24 hours.  He has no associated fever or chills, vomiting, diarrhea and reports no dysuria.  While in the ED he was noted to be very somnolent but arousable and tremulous.  Family reported that he is on no other medications except for lactulose which she does not take but drinks up to 12-18 beers daily. ED course and data review: Tachycardic to 122 with BP 159/100.  Labs with neutropenia of 3 And thrombocytopenia of 86 which are both at baseline hemoglobin 14.1.,  Bilirubin at 2.8, slightly above baseline, AST 108 with ALT 39 alk phos 124.  Creatinine normal electrolytes otherwise normal but with noted anion gap of 16 ammonia level 36, urinalysis unremarkable and UDS pending. EKG, personally viewed and interpreted showing sinus tachycardia at 117 with T wave inversion inferior leads Head CT no intracranial abnormality CT abdomen and pelvis showing hepatic steatosis and cirrhosis with portal hyper pretension but otherwise nonacute Patient was placed on CIWA protocol and given Ativan as well as thiamine folate and multivitamin and given an LR bolus Hospitalist consulted for admission.  Patient appeared frail at the time of my assessment but was awake oriented and able to answer questions  Review of Systems: As mentioned in the history of present illness. All  other systems reviewed and are negative.  Past Medical History:  Diagnosis Date        Acute encephalopathy 06/16/2021   Cirrhosis of liver (HCC)    Coag negative Staphylococcus bacteremia 04/21/2021   Wrist fracture    Past Surgical History:  Procedure Laterality Date   WRIST FRACTURE SURGERY Right 2016   Social History:  reports that he has been smoking cigarettes. He has a 20 pack-year smoking history. He has never used smokeless tobacco. He reports current alcohol use of about 4.0 standard drinks of alcohol per week. He reports that he does not currently use drugs.  No Known Allergies  Family History  Problem Relation Age of Onset   Hypertension Mother    Deep vein thrombosis Mother    Cancer Sister        unknown type   Heart disease Brother    Cancer Maternal Grandmother     Prior to Admission medications   Medication Sig Start Date End Date Taking? Authorizing Provider  lactulose (CHRONULAC) 10 GM/15ML solution Take 45 mLs (30 g total) by mouth 2 (two) times daily. 03/25/23   Wyline Mood, MD    Physical Exam: Vitals:   06/13/23 1217 06/13/23 1517 06/13/23 1518 06/13/23 1900  BP: (!) 159/100 (!) 135/92 (!) 135/92 136/78  Pulse: (!) 122  (!) 109 (!) 110  Resp: 20  18 12   Temp: (!) 97.5 F (36.4 C)  98 F (36.7 C) 98.4 F (36.9 C)  TempSrc:    Oral  SpO2: 98%  96% 93%  Weight:  Height:       Physical Exam Vitals and nursing note reviewed.  Constitutional:      General: He is not in acute distress. HENT:     Head: Normocephalic and atraumatic.  Cardiovascular:     Rate and Rhythm: Normal rate and regular rhythm.     Heart sounds: Normal heart sounds.  Pulmonary:     Effort: Pulmonary effort is normal.     Breath sounds: Normal breath sounds.  Abdominal:     Palpations: Abdomen is soft.     Tenderness: There is no abdominal tenderness.  Neurological:     Mental Status: Mental status is at baseline.     Labs on Admission: I have personally reviewed  following labs and imaging studies  CBC: Recent Labs  Lab 06/13/23 1217  WBC 3.0*  HGB 14.1  HCT 40.1  MCV 108.7*  PLT 86*   Basic Metabolic Panel: Recent Labs  Lab 06/13/23 1217  NA 139  K 4.2  CL 101  CO2 22  GLUCOSE 91  BUN 6*  CREATININE 0.64  CALCIUM 8.6*   GFR: Estimated Creatinine Clearance: 69.9 mL/min (by C-G formula based on SCr of 0.64 mg/dL). Liver Function Tests: Recent Labs  Lab 06/13/23 1217  AST 108*  ALT 39  ALKPHOS 124  BILITOT 2.8*  PROT 8.1  ALBUMIN 3.4*   Recent Labs  Lab 06/13/23 1217  LIPASE 28   Recent Labs  Lab 06/13/23 1732  AMMONIA 36*   Coagulation Profile: No results for input(s): "INR", "PROTIME" in the last 168 hours. Cardiac Enzymes: No results for input(s): "CKTOTAL", "CKMB", "CKMBINDEX", "TROPONINI" in the last 168 hours. BNP (last 3 results) No results for input(s): "PROBNP" in the last 8760 hours. HbA1C: No results for input(s): "HGBA1C" in the last 72 hours. CBG: No results for input(s): "GLUCAP" in the last 168 hours. Lipid Profile: No results for input(s): "CHOL", "HDL", "LDLCALC", "TRIG", "CHOLHDL", "LDLDIRECT" in the last 72 hours. Thyroid Function Tests: No results for input(s): "TSH", "T4TOTAL", "FREET4", "T3FREE", "THYROIDAB" in the last 72 hours. Anemia Panel: No results for input(s): "VITAMINB12", "FOLATE", "FERRITIN", "TIBC", "IRON", "RETICCTPCT" in the last 72 hours. Urine analysis:    Component Value Date/Time   COLORURINE YELLOW (A) 06/13/2023 1930   APPEARANCEUR CLEAR (A) 06/13/2023 1930   LABSPEC >1.046 (H) 06/13/2023 1930   PHURINE 5.0 06/13/2023 1930   GLUCOSEU NEGATIVE 06/13/2023 1930   HGBUR NEGATIVE 06/13/2023 1930   BILIRUBINUR NEGATIVE 06/13/2023 1930   BILIRUBINUR Negative 07/15/2022 0902   KETONESUR 5 (A) 06/13/2023 1930   PROTEINUR NEGATIVE 06/13/2023 1930   UROBILINOGEN 0.2 07/15/2022 0902   NITRITE NEGATIVE 06/13/2023 1930   LEUKOCYTESUR TRACE (A) 06/13/2023 1930     Radiological Exams on Admission: CT Head Wo Contrast  Result Date: 06/13/2023 CLINICAL DATA:  Mental status change, unknown cause EXAM: CT HEAD WITHOUT CONTRAST TECHNIQUE: Contiguous axial images were obtained from the base of the skull through the vertex without intravenous contrast. RADIATION DOSE REDUCTION: This exam was performed according to the departmental dose-optimization program which includes automated exposure control, adjustment of the mA and/or kV according to patient size and/or use of iterative reconstruction technique. COMPARISON:  CT head 02/15/23 FINDINGS: Brain: No evidence of acute infarction, hemorrhage, hydrocephalus, extra-axial collection or mass lesion/mass effect. Sequela of mild chronic microvascular ischemic change. Vascular: No hyperdense vessel or unexpected calcification. Skull: Normal. Negative for fracture or focal lesion. Sinuses/Orbits: No middle ear effusion. Unchanged chronic defect in the left-sided mastoid air cells with  communication to the [facility]. No right mastoid effusion. No middle ear effusion. Paranasal sinuses are clear. Orbits are unremarkable. Other: None. IMPRESSION: No acute intracranial abnormality. Electronically Signed   By: Lorenza Cambridge M.D.   On: 06/13/2023 17:54   CT ABDOMEN PELVIS W CONTRAST  Result Date: 06/13/2023 CLINICAL DATA:  Abdominal pain. EXAM: CT ABDOMEN AND PELVIS WITH CONTRAST TECHNIQUE: Multidetector CT imaging of the abdomen and pelvis was performed using the standard protocol following bolus administration of intravenous contrast. RADIATION DOSE REDUCTION: This exam was performed according to the departmental dose-optimization program which includes automated exposure control, adjustment of the mA and/or kV according to patient size and/or use of iterative reconstruction technique. CONTRAST:  OMNIPAQUE IOHEXOL 300 MG/ML  SOLN COMPARISON:  CT abdomen pelvis dated 04/12/2023. FINDINGS: Lower chest: Mild bibasilar dependent  atelectasis. Hepatobiliary: No focal liver abnormality is seen. The liver is diffusely hypoattenuating and demonstrates a nodular surface contour, consistent with hepatic steatosis and cirrhosis. No gallstones, gallbladder wall thickening, or biliary dilatation. Pancreas: Unremarkable. No pancreatic ductal dilatation or surrounding inflammatory changes. Spleen: Normal in size without focal abnormality. Adrenals/Urinary Tract: Adrenal glands are unremarkable. Kidneys are normal, without renal calculi, focal lesion, or hydronephrosis. Bladder is unremarkable. Stomach/Bowel: Stomach is within normal limits. Appendix appears normal. No evidence of bowel wall thickening, distention, or inflammatory changes. Vascular/Lymphatic: Aortic atherosclerosis. Portosystemic collaterals are noted near the spleen and esophagus. No enlarged abdominal or pelvic lymph nodes. Reproductive: Prostate is unremarkable. Other: No abdominal wall hernia or abnormality. No abdominopelvic ascites. Musculoskeletal: Degenerative changes are most significant at L5-S1. A 2.2 x 1.1 cm lytic lesion in the right iliac bone appears unchanged and likely reflects a benign lesion such as a hemangioma. IMPRESSION: 1. No acute findings in the abdomen or pelvis. 2. Hepatic steatosis and cirrhosis with sequela of portal hypertension. Aortic Atherosclerosis (ICD10-I70.0). Electronically Signed   By: Romona Curls M.D.   On: 06/13/2023 16:50     Data Reviewed: Relevant notes from primary care and specialist visits, past discharge summaries as available in EHR, including Care Everywhere. Prior diagnostic testing as pertinent to current admission diagnoses Updated medications and problem lists for reconciliation ED course, including vitals, labs, imaging, treatment and response to treatment Triage notes, nursing and pharmacy notes and ED provider's notes Notable results as noted in HPI   Assessment and Plan: * Acute metabolic encephalopathy Resolved  by admission Differentials include acute hepatic encephalopathy, alcohol withdrawal delirium hypoactive,PRES similar to presentation in 2022, medication related. Follow-up UDS ---> clean (patient had benzos and UDS with similar presentation back in 2022) Keep n.p.o. until safe to swallow Neurologic checks Treat each individual etiology Given history, will get MRI  Abdominal pain Patient presented with abdominal pain but was nontender on examination for admission and appeared comfortable CT nonacute and showed no ascites, stable findings Continue to monitor Holding off on any empiric antibiotics  Sinus tachycardia Patient noted to be persistently tachycardic but otherwise without sepsis criteria.  Has no fever, hypotension.  WBC normal CT abdomen and pelvis did not show ascites Given presentation with abdominal pain only concern will be for SBP but abdomen soft no ascites Will hydrate and reassess and monitor closely for appearance of SIRS criteria   Acute hepatic encephalopathy (HCC) Lactulose 3 times daily  Acute hypoactive alcohol withdrawal delirium (HCC) Moderate alcohol use disorder Patient drinks 12-18 beers a day CIWA withdrawal protocol  Alcoholic cirrhosis of liver without ascites (HCC) Possibly decompensated Patient with stable neutropenia and thrombocytopenia, elevated bilirubin,  elevated ammonia level of 36 Will get PT/INR Continue lactulose GI consult for additional recommendations  Hypertension Hydralazine IV as needed while n.p.o. Patient is not taking any home meds at this time  High anion gap metabolic acidosis Likely secondary to alcohol use Will hydrate and monitor    DVT prophylaxis: SCD  Consults: GI, Dr. Cathlyn Parsons  Advance Care Planning:   Code Status: Prior   Family Communication: none  Disposition Plan: Back to previous home environment  Severity of Illness: The appropriate patient status for this patient is INPATIENT. Inpatient status is  judged to be reasonable and necessary in order to provide the required intensity of service to ensure the patient's safety. The patient's presenting symptoms, physical exam findings, and initial radiographic and laboratory data in the context of their chronic comorbidities is felt to place them at high risk for further clinical deterioration. Furthermore, it is not anticipated that the patient will be medically stable for discharge from the hospital within 2 midnights of admission.   * I certify that at the point of admission it is my clinical judgment that the patient will require inpatient hospital care spanning beyond 2 midnights from the point of admission due to high intensity of service, high risk for further deterioration and high frequency of surveillance required.*  Author: Andris Baumann, MD 06/13/2023 8:55 PM  For on call review [email or web address].

## 2023-06-13 NOTE — ED Provider Notes (Signed)
[facility] Provider Note   Event Date/Time   First MD Initiated Contact with Patient 06/13/23 1518     (approximate) History  Abdominal Pain  HPI Mark Carr is a 67 y.o. male with a stated past medical history of alcohol abuse and subsequent alcoholic cirrhosis who presents complaining of generalized abdominal pain that has been present over the last few months but significantly worsened over the last 24 hours.  Patient states that when while he was laying in bed last night he began having significant sharp, 3/10 intermittent abdominal pain.  Patient denies any associated fever ROS: Patient currently denies any vision changes, tinnitus, difficulty speaking, facial droop, sore throat, chest pain, shortness of breath, nausea/vomiting/diarrhea, dysuria, or weakness/numbness/paresthesias in any extremity   Physical Exam  Triage Vital Signs: ED Triage Vitals  Encounter Vitals Group     BP 06/13/23 1217 (!) 159/100     Systolic BP Percentile --      Diastolic BP Percentile --      Pulse Rate 06/13/23 1217 (!) 122     Resp 06/13/23 1217 20     Temp 06/13/23 1217 (!) 97.5 F (36.4 C)     Temp src --      SpO2 06/13/23 1217 98 %     Weight 06/13/23 1215 120 lb (54.4 kg)     Height 06/13/23 1215 5\' 8"  (1.727 m)     Head Circumference --      Peak Flow --      Pain Score 06/13/23 1215 0     Pain Loc --      Pain Education --      Exclude from Growth Chart --    Most recent vital signs: Vitals:   06/13/23 2330 06/13/23 2345  BP: 113/87   Pulse: (!) 115   Resp: 14   Temp:  98.8 F (37.1 C)  SpO2: 97%    General: Awake, oriented x4. CV:  Good peripheral perfusion.  Resp:  Normal effort.  Abd:  Mild distention with fluid wave.  Mild generalized tenderness to palpation Other:  Elderly well-developed, well-nourished African-American male laying in bed in no acute distress ED Results / Procedures / Treatments  Labs (all labs ordered are listed, but only  abnormal results are displayed) Labs Reviewed  COMPREHENSIVE METABOLIC PANEL - Abnormal; Notable for the following components:      Result Value   BUN 6 (*)    Calcium 8.6 (*)    Albumin 3.4 (*)    AST 108 (*)    Total Bilirubin 2.8 (*)    Anion gap 16 (*)    All other components within normal limits  CBC - Abnormal; Notable for the following components:   WBC 3.0 (*)    RBC 3.69 (*)    MCV 108.7 (*)    MCH 38.2 (*)    Platelets 86 (*)    All other components within normal limits  URINALYSIS, ROUTINE W REFLEX MICROSCOPIC - Abnormal; Notable for the following components:   Color, Urine YELLOW (*)    APPearance CLEAR (*)    Specific Gravity, Urine >1.046 (*)    Ketones, ur 5 (*)    Leukocytes,Ua TRACE (*)    All other components within normal limits  AMMONIA - Abnormal; Notable for the following components:   Ammonia 36 (*)    All other components within normal limits  PROTIME-INR - Abnormal; Notable for the following components:   Prothrombin Time 17.7 (*)  INR 1.4 (*)    All other components within normal limits  LIPASE, BLOOD  URINE DRUG SCREEN, QUALITATIVE ([facility] ONLY)   EKG ED ECG REPORT I, Merwyn Katos, the attending physician, personally viewed and interpreted this ECG. Date: 06/13/2023 EKG Time: 1224 Rate: 117 Rhythm: Tachycardic sinus rhythm QRS Axis: normal Intervals: normal ST/T Wave abnormalities: normal Narrative Interpretation: Tachycardic sinus rhythm.  No evidence of acute ischemia RADIOLOGY ED MD interpretation: CT of the head without contrast interpreted by me shows no evidence of acute abnormalities including no intracerebral hemorrhage, obvious masses, or significant edema  CT of the abdomen and pelvis with IV contrast interpreted independently by me and shows no acute findings in the abdomen or pelvic -Agree with radiology assessment Official radiology report(s): MR BRAIN WO CONTRAST  Result Date: 06/13/2023 CLINICAL DATA:  Initial evaluation  for mental status change, unknown cause. EXAM: MRI HEAD WITHOUT CONTRAST TECHNIQUE: Multiplanar, multiecho pulse sequences of the brain and surrounding structures were obtained without intravenous contrast. COMPARISON:  CT from earlier the same day as well as previous brain MRI from 06/12/2021. FINDINGS: Brain: Advanced cerebral atrophy for age. Scattered patchy T2/FLAIR hyperintensity involving the periventricular and deep white matter both cerebral hemispheres, most likely related chronic microvascular ischemic disease. Changes are most pronounced about the occipital horns bilaterally. No abnormal foci of restricted diffusion to suggest acute or subacute ischemia. Gray-white matter differentiation maintained. Again seen are innumerable chronic micro hemorrhages involving both cerebral hemispheres, predominantly peripheral in location and most pronounced posteriorly, most consistent with cerebral amyloid angiopathy. No mass lesion, midline shift or mass effect. Ventricular prominence related to global parenchymal volume loss without hydrocephalus. No extra-axial fluid collection. Pituitary gland suprasellar region within normal limits. Vascular: Major intracranial vascular flow voids are maintained. Skull and upper cervical spine: Craniocervical junction within normal limits. Bone marrow signal intensity mildly heterogeneous without worrisome osseous lesion. 1.5 cm ovoid fat density lesion at the central forehead noted, likely a small lipoma. Sinuses/Orbits: Globes and orbital soft tissues within normal limits. Mild scattered mucosal thickening present about the ethmoidal air cells and maxillary sinuses. Small left mastoid effusion noted, of doubtful significance. Other: None. IMPRESSION: 1. No acute intracranial abnormality. 2. Innumerable chronic micro hemorrhages involving both cerebral hemispheres, most consistent with cerebral amyloid angiopathy. 3. Underlying advanced cerebral atrophy for age with mild to  moderate chronic microvascular ischemic disease. Electronically Signed   By: Rise Mu M.D.   On: 06/13/2023 22:36   CT Head Wo Contrast  Result Date: 06/13/2023 CLINICAL DATA:  Mental status change, unknown cause EXAM: CT HEAD WITHOUT CONTRAST TECHNIQUE: Contiguous axial images were obtained from the base of the skull through the vertex without intravenous contrast. RADIATION DOSE REDUCTION: This exam was performed according to the departmental dose-optimization program which includes automated exposure control, adjustment of the mA and/or kV according to patient size and/or use of iterative reconstruction technique. COMPARISON:  CT head 02/15/23 FINDINGS: Brain: No evidence of acute infarction, hemorrhage, hydrocephalus, extra-axial collection or mass lesion/mass effect. Sequela of mild chronic microvascular ischemic change. Vascular: No hyperdense vessel or unexpected calcification. Skull: Normal. Negative for fracture or focal lesion. Sinuses/Orbits: No middle ear effusion. Unchanged chronic defect in the left-sided mastoid air cells with communication to the [facility]. No right mastoid effusion. No middle ear effusion. Paranasal sinuses are clear. Orbits are unremarkable. Other: None. IMPRESSION: No acute intracranial abnormality. Electronically Signed   By: Lorenza Cambridge M.D.   On: 06/13/2023 17:54   CT ABDOMEN PELVIS W CONTRAST  Result Date: 06/13/2023 CLINICAL DATA:  Abdominal pain. EXAM: CT ABDOMEN AND PELVIS WITH CONTRAST TECHNIQUE: Multidetector CT imaging of the abdomen and pelvis was performed using the standard protocol following bolus administration of intravenous contrast. RADIATION DOSE REDUCTION: This exam was performed according to the departmental dose-optimization program which includes automated exposure control, adjustment of the mA and/or kV according to patient size and/or use of iterative reconstruction technique. CONTRAST:  OMNIPAQUE IOHEXOL 300 MG/ML  SOLN COMPARISON:  CT  abdomen pelvis dated 04/12/2023. FINDINGS: Lower chest: Mild bibasilar dependent atelectasis. Hepatobiliary: No focal liver abnormality is seen. The liver is diffusely hypoattenuating and demonstrates a nodular surface contour, consistent with hepatic steatosis and cirrhosis. No gallstones, gallbladder wall thickening, or biliary dilatation. Pancreas: Unremarkable. No pancreatic ductal dilatation or surrounding inflammatory changes. Spleen: Normal in size without focal abnormality. Adrenals/Urinary Tract: Adrenal glands are unremarkable. Kidneys are normal, without renal calculi, focal lesion, or hydronephrosis. Bladder is unremarkable. Stomach/Bowel: Stomach is within normal limits. Appendix appears normal. No evidence of bowel wall thickening, distention, or inflammatory changes. Vascular/Lymphatic: Aortic atherosclerosis. Portosystemic collaterals are noted near the spleen and esophagus. No enlarged abdominal or pelvic lymph nodes. Reproductive: Prostate is unremarkable. Other: No abdominal wall hernia or abnormality. No abdominopelvic ascites. Musculoskeletal: Degenerative changes are most significant at L5-S1. A 2.2 x 1.1 cm lytic lesion in the right iliac bone appears unchanged and likely reflects a benign lesion such as a hemangioma. IMPRESSION: 1. No acute findings in the abdomen or pelvis. 2. Hepatic steatosis and cirrhosis with sequela of portal hypertension. Aortic Atherosclerosis (ICD10-I70.0). Electronically Signed   By: Romona Curls M.D.   On: 06/13/2023 16:50   PROCEDURES: Critical Care performed: No .1-3 Lead EKG Interpretation  Performed by: Merwyn Katos, MD Authorized by: Merwyn Katos, MD     Interpretation: abnormal     ECG rate:  113   ECG rate assessment: tachycardic     Rhythm: sinus tachycardia     Ectopy: none     Conduction: normal    MEDICATIONS ORDERED IN ED: Medications  LORazepam (ATIVAN) tablet 1-4 mg (has no administration in time range)    Or  LORazepam  (ATIVAN) injection 1-4 mg (has no administration in time range)  thiamine (VITAMIN B1) tablet 100 mg (100 mg Oral Not Given 06/13/23 1740)    Or  thiamine (VITAMIN B1) injection 100 mg ( Intravenous See Alternative 06/13/23 1740)  folic acid (FOLVITE) tablet 1 mg (1 mg Oral Not Given 06/13/23 1741)  multivitamin with minerals tablet 1 tablet (1 tablet Oral Not Given 06/13/23 1741)  lactulose (CHRONULAC) 10 GM/15ML solution 30 g (30 g Oral Patient Refused/Not Given 06/14/23 0000)  lactated ringers infusion ( Intravenous New Bag/Given 06/13/23 2319)  lactated ringers bolus 1,000 mL (0 mLs Intravenous Stopped 06/13/23 1936)  thiamine (VITAMIN B1) injection 100 mg (100 mg Intravenous Given 06/13/23 1551)  folic acid (FOLVITE) tablet 1 mg (1 mg Oral Given 06/13/23 1549)  LORazepam (ATIVAN) injection 2 mg (2 mg Intravenous Given 06/13/23 1551)  iohexol (OMNIPAQUE) 300 MG/ML solution 100 mL (100 mLs Intravenous Contrast Given 06/13/23 1629)   IMPRESSION / MDM / ASSESSMENT AND PLAN / ED COURSE  I reviewed the triage vital signs and the nursing notes.                             The patient is on the cardiac monitor to evaluate for evidence of arrhythmia and/or significant heart  rate changes. Patient's presentation is most consistent with acute presentation with potential threat to life or bodily function. Patient presents for altered mental status of unknown origin  Will obtain medical workup and discuss with social work to try to obtain collateral information.  Given History, Physical, and Workup there is no overt concern for a dangerous emergent cause such as, but not limited to, CNS infection, severe Toxidrome, severe metabolic derangement, or stroke.  Patient does have mildly elevated ammonia as well as risk factors for alcohol detoxification.  Disposition: Admit; the patient is suffering altered mental status that is persistent and therefore they will be admitted.   FINAL CLINICAL IMPRESSION(S) / ED DIAGNOSES    Final diagnoses:  Altered mental status, unspecified altered mental status type  Alcohol withdrawal syndrome, with delirium (HCC)   Rx / DC Orders   ED Discharge Orders     None      Note:  This document was prepared using Dragon voice recognition software and may include unintentional dictation errors.   Merwyn Katos, MD 06/14/23 [phone]

## 2023-06-13 NOTE — Assessment & Plan Note (Signed)
Hydralazine IV as needed while n.p.o. Patient is not taking any home meds at this time

## 2023-06-13 NOTE — Assessment & Plan Note (Addendum)
Moderate alcohol use disorder Patient drinks 12-18 beers a day CIWA withdrawal protocol

## 2023-06-13 NOTE — Assessment & Plan Note (Signed)
Lactulose 3 times daily

## 2023-06-13 NOTE — Assessment & Plan Note (Signed)
Likely secondary to alcohol use Will hydrate and monitor

## 2023-06-13 NOTE — Assessment & Plan Note (Signed)
Resolved by admission Differentials include acute hepatic encephalopathy, alcohol withdrawal delirium hypoactive,PRES similar to presentation in 2022, medication related. Follow-up UDS ---> clean (patient had benzos and UDS with similar presentation back in 2022) Keep n.p.o. until safe to swallow Neurologic checks Treat each individual etiology Given history, will get MRI

## 2023-06-13 NOTE — Assessment & Plan Note (Addendum)
Possibly decompensated Patient with stable neutropenia and thrombocytopenia, elevated bilirubin, elevated ammonia level of 36 Will get PT/INR Continue lactulose GI consult for additional recommendations

## 2023-06-13 NOTE — ED Triage Notes (Addendum)
Pt BIB [facility] with abdominal pain that started yesterday. Pt has cirrhosis of the liver. Pt also having lower back pain that has been getting worse and more aggravating. Pt also complaining of cough and eye pain. Pt states not eating and not drinking a lot of water. Pt states last alcoholic drink was today, 1/2 a beer  EMS vitals.  151/89 99% RA CBG 88 122HR  Red top sent to lab with save label

## 2023-06-14 ENCOUNTER — Encounter: Payer: Self-pay | Admitting: Internal Medicine

## 2023-06-14 DIAGNOSIS — R000 Tachycardia, unspecified: Secondary | ICD-10-CM

## 2023-06-14 DIAGNOSIS — G9341 Metabolic encephalopathy: Secondary | ICD-10-CM | POA: Diagnosis not present

## 2023-06-14 DIAGNOSIS — R1084 Generalized abdominal pain: Secondary | ICD-10-CM | POA: Diagnosis not present

## 2023-06-14 DIAGNOSIS — K7030 Alcoholic cirrhosis of liver without ascites: Secondary | ICD-10-CM

## 2023-06-14 DIAGNOSIS — F10931 Alcohol use, unspecified with withdrawal delirium: Secondary | ICD-10-CM

## 2023-06-14 DIAGNOSIS — D696 Thrombocytopenia, unspecified: Secondary | ICD-10-CM

## 2023-06-14 DIAGNOSIS — R109 Unspecified abdominal pain: Secondary | ICD-10-CM

## 2023-06-14 LAB — COMPREHENSIVE METABOLIC PANEL
ALT: 29 U/L (ref 0–44)
AST: 81 U/L — ABNORMAL HIGH (ref 15–41)
Albumin: 2.4 g/dL — ABNORMAL LOW (ref 3.5–5.0)
Alkaline Phosphatase: 99 U/L (ref 38–126)
Anion gap: 14 (ref 5–15)
BUN: 5 mg/dL — ABNORMAL LOW (ref 8–23)
CO2: 20 mmol/L — ABNORMAL LOW (ref 22–32)
Calcium: 7.9 mg/dL — ABNORMAL LOW (ref 8.9–10.3)
Chloride: 103 mmol/L (ref 98–111)
Creatinine, Ser: 0.49 mg/dL — ABNORMAL LOW (ref 0.61–1.24)
GFR, Estimated: >60 mL/min (ref >60)
Glucose, Bld: 76 mg/dL (ref 70–99)
Potassium: 3.5 mmol/L (ref 3.5–5.1)
Sodium: 137 mmol/L (ref 135–145)
Total Bilirubin: 1.6 mg/dL — ABNORMAL HIGH (ref 0.3–1.2)
Total Protein: 5.7 g/dL — ABNORMAL LOW (ref 6.5–8.1)

## 2023-06-14 LAB — CBC
HCT: 33.4 % — ABNORMAL LOW (ref 39.0–52.0)
Hemoglobin: 11.8 g/dL — ABNORMAL LOW (ref 13.0–17.0)
MCH: 37.8 pg — ABNORMAL HIGH (ref 26.0–34.0)
MCHC: 35.3 g/dL (ref 30.0–36.0)
MCV: 107.1 fL — ABNORMAL HIGH (ref 80.0–100.0)
Platelets: 56 10*3/uL — ABNORMAL LOW (ref 150–400)
RBC: 3.12 MIL/uL — ABNORMAL LOW (ref 4.22–5.81)
RDW: 14.1 % (ref 11.5–15.5)
WBC: 1.9 10*3/uL — ABNORMAL LOW (ref 4.0–10.5)
nRBC: 0 % (ref 0.0–0.2)

## 2023-06-14 LAB — HIV ANTIBODY (ROUTINE TESTING W REFLEX): HIV Screen 4th Generation wRfx: NONREACTIVE

## 2023-06-14 LAB — LIPASE, BLOOD: Lipase: 27 U/L (ref 11–51)

## 2023-06-14 MED ORDER — ACETAMINOPHEN 325 MG PO TABS: 650.0000 mg | ORAL_TABLET | Freq: Four times a day (QID) | ORAL | Status: DC | PRN

## 2023-06-14 MED ORDER — ONDANSETRON HCL 4 MG/2ML IJ SOLN: 4.0000 mg | Freq: Four times a day (QID) | INTRAMUSCULAR | Status: DC | PRN

## 2023-06-14 MED ORDER — HYDROCODONE-ACETAMINOPHEN 5-325 MG PO TABS: 1.0000 | ORAL_TABLET | ORAL | Status: DC | PRN

## 2023-06-14 MED ORDER — ACETAMINOPHEN 650 MG RE SUPP: 650.0000 mg | Freq: Four times a day (QID) | RECTAL | Status: DC | PRN

## 2023-06-14 MED ORDER — LACTATED RINGERS IV BOLUS
500.0000 mL | Freq: Once | INTRAVENOUS | Status: AC
  Administered 2023-06-14: 500 mL via INTRAVENOUS

## 2023-06-14 MED ORDER — NICOTINE 14 MG/24HR TD PT24
14.0000 mg | MEDICATED_PATCH | Freq: Once | TRANSDERMAL | Status: DC
  Administered 2023-06-14: 14 mg via TRANSDERMAL
  Filled 2023-06-14: qty 1

## 2023-06-14 MED ORDER — ONDANSETRON HCL 4 MG PO TABS: 4.0000 mg | ORAL_TABLET | Freq: Four times a day (QID) | ORAL | Status: DC | PRN

## 2023-06-14 MED ORDER — ADULT MULTIVITAMIN W/MINERALS CH: 1.0000 | ORAL_TABLET | Freq: Every day | ORAL | 1 refills | Status: DC

## 2023-06-14 MED ORDER — MORPHINE SULFATE (PF) 2 MG/ML IV SOLN: 2.0000 mg | INTRAVENOUS | Status: DC | PRN

## 2023-06-14 MED ORDER — VITAMIN B-1 100 MG PO TABS: 100.0000 mg | ORAL_TABLET | Freq: Every day | ORAL | 1 refills | Status: DC

## 2023-06-14 MED ORDER — ENOXAPARIN SODIUM 40 MG/0.4ML IJ SOSY
40.0000 mg | PREFILLED_SYRINGE | INTRAMUSCULAR | Status: DC
  Administered 2023-06-14: 40 mg via SUBCUTANEOUS
  Filled 2023-06-14: qty 0.4

## 2023-06-14 NOTE — ED Notes (Signed)
This RN to bedside. Pt laying in bed almost face down. Pt did not use call bell. Pt assisted to standing position to reposition in bed. Pt positioned for comfort, once again instructed on importance and use of call bell. Pt requesting to go home. Pt oriented to self, location, situation, disoriented to time. Pt informed attending Dr will come see pt. Pt denies further needs, WCTM. Call light in reach, bed alarm on.

## 2023-06-14 NOTE — ED Notes (Signed)
Duncan MD at bedside 

## 2023-06-14 NOTE — Discharge Summary (Signed)
Physician Discharge Summary   Patient: Mark Carr MRN: [id number] DOB: [birth year]  Admit date:     06/13/2023  Discharge date: 06/14/23  Discharge Physician: Arnetha Courser   PCP: Erasmo Downer, MD   Recommendations at discharge:  Please obtain CBC and CMP on follow-up Follow-up with gastroenterology Follow-up with primary care provider  Discharge Diagnoses: Principal Problem:   Acute metabolic encephalopathy Active Problems:   Sinus tachycardia   Abdominal pain   Acute hypoactive alcohol withdrawal delirium (HCC)   Acute hepatic encephalopathy (HCC)   Alcoholic cirrhosis of liver without ascites (HCC)   Alcohol use disorder, moderate, dependence (HCC)   High anion gap metabolic acidosis   Thrombocytopenia (HCC)   Hypertension   Alcohol withdrawal syndrome, with delirium [facility])   Hospital Course: Taken from H&P.  Mark Carr is a 67 y.o. male with medical history significant for Moderate alcohol use disorder, hepatic cirrhosis on lactulose, last hospitalized in 2022 with acute encephalopathy secondary to PRES seen on MRI with negative EEG at the time and UDS positive benzodiazepine, who presents to the ED with acute worsening of chronic abdominal pain over the past 24 hours.  While in the ED he was noted to be very somnolent but arousable and tremulous.  Patient was not taking his lactulose and continued to drink up to 12-18 beers daily.  On presentation to ED he was tachycardic, labs pertinent for neutropenia of 3, thrombocytopenia at 86, bilirubin 2.8, AST 108, ALT 39, alkaline phosphatase 124, ammonia 36. EKG with sinus tachycardia and T wave inversions in inferior leads. CT head with no intracranial abnormality. CT abdomen and pelvis showing hepatic steatosis and cirrhosis with portal hyper pretension but otherwise nonacute Patient was placed on CIWA protocol and given Ativan as well as thiamine folate and multivitamin and given an LR bolus.  8/5:  Hemodynamically stable, CIWA of 0, UDS negative, CBC with WBC 1.9, platelet 56 and hemoglobin 11.8-all cell lines decreased.  CMP with CO2 of 20, improving AST and T. Bili, INR 1.4 MRI brain with no acute intracranial abnormality.  Noted innumerable chronic microhemorrhages involving both cerebral hemispheres, most consistent with cerebral amyloid angiopathy.  Patient appears to be at baseline.  Alert and oriented.  Patient was drinking quite a bit of beer yesterday and likely experiencing somnolent secondary to alcohol abuse.  Lipase and ammonia levels were normal.  No ascites.  Abdominal pain has been resolved.  Might have some element of alcohol induced gastritis.  No nausea or vomiting.  Able to tolerate diet.  Patient was counseled extensively to stop drinking as he will increase the risk of mortality if he continues to drink with underlying disease/cirrhotic liver.  He will not be a candidate for liver transplant.  Discussed with daughter and wife at bedside and seems understanding.  Patient is having his upcoming appointment with gastroenterology next week which she will continue.  Patient was given some vitamin supplement and instructed to continue using his lactulose, titrate dose to have 2-3 soft bowel movements daily.  Patient will continue on current medications and need continuation of counseling for alcohol cessation.  He will follow-up with gastroenterology according to his schedule appointment next week.     Assessment and Plan: * Acute metabolic encephalopathy Resolved by admission Differentials include acute hepatic encephalopathy, alcohol withdrawal delirium hypoactive,PRES similar to presentation in 2022, medication related. Follow-up UDS ---> clean (patient had benzos and UDS with similar presentation back in 2022)  Abdominal pain Patient presented with abdominal pain  but was nontender on examination for admission and appeared comfortable CT nonacute and showed no ascites,  stable findings Continue to monitor  Sinus tachycardia Patient noted to be persistently tachycardic but otherwise without sepsis criteria.  Has no fever, hypotension.  WBC normal CT abdomen and pelvis did not show ascites  Acute hepatic encephalopathy (HCC) Less likely as ammonia was normal. Lactulose 3 times daily-titrate to have 2-3 soft bowel movements daily  Acute hypoactive alcohol withdrawal delirium (HCC) Moderate alcohol use disorder Patient drinks 12-18 beers a day CIWA withdrawal protocol  Alcoholic cirrhosis of liver without ascites (HCC) Possibly decompensated Patient with stable neutropenia and thrombocytopenia, elevated bilirubin, elevated ammonia level of 36 Will get PT/INR Continue lactulose GI consult for additional recommendations  Hypertension Hydralazine IV as needed while n.p.o. Patient is not taking any home meds at this time   Consultants: Gastroenterology Procedures performed: None Disposition: Home Diet recommendation:  Discharge Diet Orders (From admission, onward)     Start     Ordered   06/14/23 0000  Diet - low sodium heart healthy        06/14/23 1240           Regular diet DISCHARGE MEDICATION: Allergies as of 06/14/2023   No Known Allergies      Medication List     TAKE these medications    lactulose 10 GM/15ML solution Commonly known as: CHRONULAC Take 45 mLs (30 g total) by mouth 2 (two) times daily.   multivitamin with minerals Tabs tablet Take 1 tablet by mouth daily. Start taking on: June 15, 2023   thiamine 100 MG tablet Commonly known as: Vitamin B-1 Take 1 tablet (100 mg total) by mouth daily. Start taking on: June 15, 2023        Follow-up Information     Bacigalupo, Marzella Schlein, MD. Schedule an appointment as soon as possible for a visit in 1 week(s).   Specialty: Family Medicine Contact information: [address] [place] [address] [phone]         Wyline Mood, MD. Schedule  an appointment as soon as possible for a visit.   Specialty: Gastroenterology Contact information: [address] [place] [address] [phone]                Discharge Exam: Ceasar Mons Weights   06/13/23 1215  Weight: 54.4 kg   General.  Severely malnourished gentleman, in no acute distress. Pulmonary.  Lungs clear bilaterally, normal respiratory effort. CV.  Regular rate and rhythm, no JVD, rub or murmur. Abdomen.  Soft, nontender, nondistended, BS positive. CNS.  Alert and oriented .  No focal neurologic deficit. Extremities.  No edema, no cyanosis, pulses intact and symmetrical. Psychiatry.  Judgment and insight appears normal.   Condition at discharge: stable  The results of significant diagnostics from this hospitalization (including imaging, microbiology, ancillary and laboratory) are listed below for reference.   Imaging Studies: MR BRAIN WO CONTRAST  Result Date: 06/13/2023 CLINICAL DATA:  Initial evaluation for mental status change, unknown cause. EXAM: MRI HEAD WITHOUT CONTRAST TECHNIQUE: Multiplanar, multiecho pulse sequences of the brain and surrounding structures were obtained without intravenous contrast. COMPARISON:  CT from earlier the same day as well as previous brain MRI from 06/12/2021. FINDINGS: Brain: Advanced cerebral atrophy for age. Scattered patchy T2/FLAIR hyperintensity involving the periventricular and deep white matter both cerebral hemispheres, most likely related chronic microvascular ischemic disease. Changes are most pronounced about the occipital horns bilaterally. No abnormal foci  of restricted diffusion to suggest acute or subacute ischemia. Gray-white matter differentiation maintained. Again seen are innumerable chronic micro hemorrhages involving both cerebral hemispheres, predominantly peripheral in location and most pronounced posteriorly, most consistent with cerebral amyloid angiopathy. No mass lesion, midline shift or mass  effect. Ventricular prominence related to global parenchymal volume loss without hydrocephalus. No extra-axial fluid collection. Pituitary gland suprasellar region within normal limits. Vascular: Major intracranial vascular flow voids are maintained. Skull and upper cervical spine: Craniocervical junction within normal limits. Bone marrow signal intensity mildly heterogeneous without worrisome osseous lesion. 1.5 cm ovoid fat density lesion at the central forehead noted, likely a small lipoma. Sinuses/Orbits: Globes and orbital soft tissues within normal limits. Mild scattered mucosal thickening present about the ethmoidal air cells and maxillary sinuses. Small left mastoid effusion noted, of doubtful significance. Other: None. IMPRESSION: 1. No acute intracranial abnormality. 2. Innumerable chronic micro hemorrhages involving both cerebral hemispheres, most consistent with cerebral amyloid angiopathy. 3. Underlying advanced cerebral atrophy for age with mild to moderate chronic microvascular ischemic disease. Electronically Signed   By: Rise Mu M.D.   On: 06/13/2023 22:36   CT Head Wo Contrast  Result Date: 06/13/2023 CLINICAL DATA:  Mental status change, unknown cause EXAM: CT HEAD WITHOUT CONTRAST TECHNIQUE: Contiguous axial images were obtained from the base of the skull through the vertex without intravenous contrast. RADIATION DOSE REDUCTION: This exam was performed according to the departmental dose-optimization program which includes automated exposure control, adjustment of the mA and/or kV according to patient size and/or use of iterative reconstruction technique. COMPARISON:  CT head 02/15/23 FINDINGS: Brain: No evidence of acute infarction, hemorrhage, hydrocephalus, extra-axial collection or mass lesion/mass effect. Sequela of mild chronic microvascular ischemic change. Vascular: No hyperdense vessel or unexpected calcification. Skull: Normal. Negative for fracture or focal lesion.  Sinuses/Orbits: No middle ear effusion. Unchanged chronic defect in the left-sided mastoid air cells with communication to the [facility]. No right mastoid effusion. No middle ear effusion. Paranasal sinuses are clear. Orbits are unremarkable. Other: None. IMPRESSION: No acute intracranial abnormality. Electronically Signed   By: Lorenza Cambridge M.D.   On: 06/13/2023 17:54   CT ABDOMEN PELVIS W CONTRAST  Result Date: 06/13/2023 CLINICAL DATA:  Abdominal pain. EXAM: CT ABDOMEN AND PELVIS WITH CONTRAST TECHNIQUE: Multidetector CT imaging of the abdomen and pelvis was performed using the standard protocol following bolus administration of intravenous contrast. RADIATION DOSE REDUCTION: This exam was performed according to the departmental dose-optimization program which includes automated exposure control, adjustment of the mA and/or kV according to patient size and/or use of iterative reconstruction technique. CONTRAST:  OMNIPAQUE IOHEXOL 300 MG/ML  SOLN COMPARISON:  CT abdomen pelvis dated 04/12/2023. FINDINGS: Lower chest: Mild bibasilar dependent atelectasis. Hepatobiliary: No focal liver abnormality is seen. The liver is diffusely hypoattenuating and demonstrates a nodular surface contour, consistent with hepatic steatosis and cirrhosis. No gallstones, gallbladder wall thickening, or biliary dilatation. Pancreas: Unremarkable. No pancreatic ductal dilatation or surrounding inflammatory changes. Spleen: Normal in size without focal abnormality. Adrenals/Urinary Tract: Adrenal glands are unremarkable. Kidneys are normal, without renal calculi, focal lesion, or hydronephrosis. Bladder is unremarkable. Stomach/Bowel: Stomach is within normal limits. Appendix appears normal. No evidence of bowel wall thickening, distention, or inflammatory changes. Vascular/Lymphatic: Aortic atherosclerosis. Portosystemic collaterals are noted near the spleen and esophagus. No enlarged abdominal or pelvic lymph nodes. Reproductive:  Prostate is unremarkable. Other: No abdominal wall hernia or abnormality. No abdominopelvic ascites. Musculoskeletal: Degenerative changes are most significant at L5-S1. A 2.2 x 1.1  cm lytic lesion in the right iliac bone appears unchanged and likely reflects a benign lesion such as a hemangioma. IMPRESSION: 1. No acute findings in the abdomen or pelvis. 2. Hepatic steatosis and cirrhosis with sequela of portal hypertension. Aortic Atherosclerosis (ICD10-I70.0). Electronically Signed   By: Romona Curls M.D.   On: 06/13/2023 16:50    Microbiology: Results for orders placed or performed during the hospital encounter of 02/15/23  Resp panel by RT-PCR (RSV, Flu A&B, Covid) Anterior Nasal Swab     Status: None   Collection Time: 02/15/23  6:43 AM   Specimen: Anterior Nasal Swab  Result Value Ref Range Status   SARS Coronavirus 2 by RT PCR NEGATIVE NEGATIVE Final    Comment: (NOTE) SARS-CoV-2 target nucleic acids are NOT DETECTED.  The SARS-CoV-2 RNA is generally detectable in upper respiratory specimens during the acute phase of infection. The lowest concentration of SARS-CoV-2 viral copies this assay can detect is 138 copies/mL. A negative result does not preclude SARS-Cov-2 infection and should not be used as the sole basis for treatment or other patient management decisions. A negative result may occur with  improper specimen collection/handling, submission of specimen other than nasopharyngeal swab, presence of viral mutation(s) within the areas targeted by this assay, and inadequate number of viral copies(<138 copies/mL). A negative result must be combined with clinical observations, patient history, and epidemiological information. The expected result is Negative.  Fact Sheet for Patients:  BloggerCourse.com  Fact Sheet for Healthcare Providers:  SeriousBroker.it  This test is no t yet approved or cleared by the [place] FDA and   has been authorized for detection and/or diagnosis of SARS-CoV-2 by FDA under an Emergency Use Authorization (EUA). This EUA will remain  in effect (meaning this test can be used) for the duration of the COVID-19 declaration under Section 564(b)(1) of the Act, 21 U.S.C.section 360bbb-3(b)(1), unless the authorization is terminated  or revoked sooner.       Influenza A by PCR NEGATIVE NEGATIVE Final   Influenza B by PCR NEGATIVE NEGATIVE Final    Comment: (NOTE) The Xpert Xpress SARS-CoV-2/FLU/RSV plus assay is intended as an aid in the diagnosis of influenza from Nasopharyngeal swab specimens and should not be used as a sole basis for treatment. Nasal washings and aspirates are unacceptable for Xpert Xpress SARS-CoV-2/FLU/RSV testing.  Fact Sheet for Patients: BloggerCourse.com  Fact Sheet for Healthcare Providers: SeriousBroker.it  This test is not yet approved or cleared by the [place] FDA and has been authorized for detection and/or diagnosis of SARS-CoV-2 by FDA under an Emergency Use Authorization (EUA). This EUA will remain in effect (meaning this test can be used) for the duration of the COVID-19 declaration under Section 564(b)(1) of the Act, 21 U.S.C. section 360bbb-3(b)(1), unless the authorization is terminated or revoked.     Resp Syncytial Virus by PCR NEGATIVE NEGATIVE Final    Comment: (NOTE) Fact Sheet for Patients: BloggerCourse.com  Fact Sheet for Healthcare Providers: SeriousBroker.it  This test is not yet approved or cleared by the [place] FDA and has been authorized for detection and/or diagnosis of SARS-CoV-2 by FDA under an Emergency Use Authorization (EUA). This EUA will remain in effect (meaning this test can be used) for the duration of the COVID-19 declaration under Section 564(b)(1) of the Act, 21 U.S.C. section 360bbb-3(b)(1),  unless the authorization is terminated or revoked.  Performed at [facility], [address] [place], [place] [address]     Labs: CBC: Recent Labs  Lab 06/13/23 1217 06/14/23 0519  WBC 3.0* 1.9*  HGB 14.1 11.8*  HCT 40.1 33.4*  MCV 108.7* 107.1*  PLT 86* 56*   Basic Metabolic Panel: Recent Labs  Lab 06/13/23 1217 06/14/23 0519  NA 139 137  K 4.2 3.5  CL 101 103  CO2 22 20*  GLUCOSE 91 76  BUN 6* 5*  CREATININE 0.64 0.49*  CALCIUM 8.6* 7.9*   Liver Function Tests: Recent Labs  Lab 06/13/23 1217 06/14/23 0519  AST 108* 81*  ALT 39 29  ALKPHOS 124 99  BILITOT 2.8* 1.6*  PROT 8.1 5.7*  ALBUMIN 3.4* 2.4*   CBG: No results for input(s): "GLUCAP" in the last 168 hours.  Discharge time spent: greater than 30 minutes.  This record has been created using Conservation officer, historic buildings. Errors have been sought and corrected,but may not always be located. Such creation errors do not reflect on the standard of care.   Signed: Arnetha Courser, MD [facility] 06/14/2023

## 2023-06-14 NOTE — Assessment & Plan Note (Signed)
Patient noted to be persistently tachycardic but otherwise without sepsis criteria.  Has no fever, hypotension.  WBC normal CT abdomen and pelvis did not show ascites Given presentation with abdominal pain only concern will be for SBP but abdomen soft no ascites Will hydrate and reassess and monitor closely for appearance of SIRS criteria

## 2023-06-14 NOTE — Hospital Course (Addendum)
Taken from H&P.  Mark Carr is a 67 y.o. male with medical history significant for Moderate alcohol use disorder, hepatic cirrhosis on lactulose, last hospitalized in 2022 with acute encephalopathy secondary to PRES seen on MRI with negative EEG at the time and UDS positive benzodiazepine, who presents to the ED with acute worsening of chronic abdominal pain over the past 24 hours.  While in the ED he was noted to be very somnolent but arousable and tremulous.  Patient was not taking his lactulose and continued to drink up to 12-18 beers daily.  On presentation to ED he was tachycardic, labs pertinent for neutropenia of 3, thrombocytopenia at 86, bilirubin 2.8, AST 108, ALT 39, alkaline phosphatase 124, ammonia 36. EKG with sinus tachycardia and T wave inversions in inferior leads. CT head with no intracranial abnormality. CT abdomen and pelvis showing hepatic steatosis and cirrhosis with portal hyper pretension but otherwise nonacute Patient was placed on CIWA protocol and given Ativan as well as thiamine folate and multivitamin and given an LR bolus.  8/5: Hemodynamically stable, CIWA of 0, UDS negative, CBC with WBC 1.9, platelet 56 and hemoglobin 11.8-all cell lines decreased.  CMP with CO2 of 20, improving AST and T. Bili, INR 1.4 MRI brain with no acute intracranial abnormality.  Noted innumerable chronic microhemorrhages involving both cerebral hemispheres, most consistent with cerebral amyloid angiopathy.  Patient appears to be at baseline.  Alert and oriented.  Patient was drinking quite a bit of beer yesterday and likely experiencing somnolent secondary to alcohol abuse.  Lipase and ammonia levels were normal.  No ascites.  Abdominal pain has been resolved.  Might have some element of alcohol induced gastritis.  No nausea or vomiting.  Able to tolerate diet.  Patient was counseled extensively to stop drinking as he will increase the risk of mortality if he continues to drink with  underlying disease/cirrhotic liver.  He will not be a candidate for liver transplant.  Discussed with daughter and wife at bedside and seems understanding.  Patient is having his upcoming appointment with gastroenterology next week which she will continue.  Patient was given some vitamin supplement and instructed to continue using his lactulose, titrate dose to have 2-3 soft bowel movements daily.  Patient will continue on current medications and need continuation of counseling for alcohol cessation.  He will follow-up with gastroenterology according to his schedule appointment next week.

## 2023-06-14 NOTE — TOC Initial Note (Signed)
Transition of Care [facility]) - Initial/Assessment Note    Patient Details  Name: Mark Carr MRN: [id number] Date of Birth: 1956/04/19  Transition of [facility]) CM/SW Contact:    Kreg Shropshire, RN Phone Number: 06/14/2023, 12:38 PM  Clinical Narrative:           Cm received TOC consult for Substance abuse education. Pt and family stated he has ETOH problem. Declined Substance abuse resources due to already having resources at this time.         Pt arrived from ED from: Home Caregiver Support: Spouse DME at Home: None Transportation: Family Previous Services: None HH/SNF Preference: None First Person of Contact: Royanne Foots Spouse PCP: Shirlee Latch, MD  Cm will continue to follow for toc needs and d/c planning.     Barriers to Discharge: Continued Medical Work up   Patient Goals and CMS Choice   CMS Medicare.gov Compare Post Acute Care list provided to:: Patient Choice offered to / list presented to : Patient      Expected Discharge Plan and Services     Post Acute Care Choice: Home Health, Skilled Nursing Facility Living arrangements for the past 2 months: Single Family Home                                      Prior Living Arrangements/Services Living arrangements for the past 2 months: Single Family Home Lives with:: Self, Spouse          Need for Family Participation in Patient Care: Yes (Comment) Care giver support system in place?: Yes (comment)      Activities of Daily Living Home Assistive Devices/Equipment: None ADL Screening (condition at time of admission) Patient's cognitive ability adequate to safely complete daily activities?: Yes Is the patient deaf or have difficulty hearing?: No Does the patient have difficulty seeing, even when wearing glasses/contacts?: No Does the patient have difficulty concentrating, remembering, or making decisions?: Yes Patient able to express need for assistance with ADLs?: Yes Does the patient have  difficulty dressing or bathing?: No Independently performs ADLs?: Yes (appropriate for developmental age) Does the patient have difficulty walking or climbing stairs?: Yes Weakness of Legs: Both Weakness of Arms/Hands: None  Permission Sought/Granted                  Emotional Assessment Appearance:: Appears stated age Attitude/Demeanor/Rapport: Engaged Affect (typically observed): Calm Orientation: : Oriented to Self, Oriented to Place, Oriented to  Time, Oriented to Situation      Admission diagnosis:  Acute hypoactive alcohol withdrawal delirium (HCC) [F10.231] Patient Active Problem List   Diagnosis Date Noted   Sinus tachycardia 06/14/2023   Abdominal pain 06/14/2023   Acute hypoactive alcohol withdrawal delirium (HCC) 06/13/2023   Acute metabolic encephalopathy 06/13/2023   Acute hepatic encephalopathy (HCC) 06/13/2023   Chronic bilateral low back pain without sciatica 12/29/2022   Dark stools 12/01/2022   Frequent falls 07/15/2022   Generally unsteady 07/15/2022   Alcohol use disorder, moderate, dependence (HCC) 01/29/2022   Thrombocytopenia (HCC) 01/29/2022   Hypertension 01/29/2022   Memory loss or impairment 07/29/2021   PRES (posterior reversible encephalopathy syndrome) 07/29/2021   Essential hypertension 07/29/2021   Wernicke encephalopathy 06/16/2021   High anion gap metabolic acidosis 06/16/2021   Alcoholic cirrhosis of liver without ascites (HCC) 06/16/2021   Protein-calorie malnutrition, severe 04/02/2021   Cirrhosis of liver (HCC)    Generalized weakness  Tobacco use disorder 05/18/2019   Macrocytosis 05/18/2019   Steatosis of liver 05/18/2019   Chronic fatigue 05/18/2019   PCP:  Erasmo Downer, MD Pharmacy:   [facility] [id number] - [place], [place] - [address] [place] [address] Phone: [phone] Fax: [phone]     Social Determinants of Health (SDOH) Social History: SDOH Screenings    Food Insecurity: Patient Unable To Answer (06/14/2023)  Housing: Patient Unable To Answer (06/14/2023)  Transportation Needs: Patient Unable To Answer (06/14/2023)  Utilities: Patient Unable To Answer (06/14/2023)  Alcohol Screen: Low Risk  (08/03/2022)  Depression (PHQ2-9): Low Risk  (12/01/2022)  Financial Resource Strain: Low Risk  (08/03/2022)  Physical Activity: Insufficiently Active (08/03/2022)  Social Connections: Socially Isolated (08/03/2022)  Stress: No Stress Concern Present (08/03/2022)  Tobacco Use: High Risk (06/14/2023)   SDOH Interventions:     Readmission Risk Interventions     No data to display

## 2023-06-14 NOTE — Care Management Obs Status (Signed)
MEDICARE OBSERVATION STATUS NOTIFICATION   Patient Details  Name: Mark Carr MRN: [id number] Date of Birth: 1956-05-17   Medicare Observation Status Notification Given:  Yes    Kreg Shropshire, RN 06/14/2023, 12:57 PM

## 2023-06-14 NOTE — Assessment & Plan Note (Signed)
Patient presented with abdominal pain but was nontender on examination for admission and appeared comfortable CT nonacute and showed no ascites, stable findings Continue to monitor Holding off on any empiric antibiotics

## 2023-06-14 NOTE — ED Notes (Signed)
Pt ambulatory to bedside bathroom with x1 assist, pt not very steady. Pt will need at least x1 assistance with ambulation going forward. Pt instructed to press call bell when he is finished going to the bathroom. WCTM.

## 2023-06-14 NOTE — Care Management CC44 (Signed)
Condition Code 44 Documentation Completed  Patient Details  Name: Mark Carr MRN: [id number] Date of Birth: October 14, 1956   Condition Code 44 given:  Yes Patient signature on Condition Code 44 notice:  Yes Documentation of 2 MD's agreement:  Yes Code 44 added to claim:  Yes    Kreg Shropshire, RN 06/14/2023, 12:57 PM

## 2023-06-23 ENCOUNTER — Telehealth: Payer: Self-pay | Admitting: Gastroenterology

## 2023-06-23 NOTE — Telephone Encounter (Signed)
Patient daughter Mark Carr) called in to cancel his appointment. Patient is still drinking and he is not ready for appointment at this time. He will call us back when he is ready to schedule. The patient patient want Dr.Anna advise on what to do about her father. They don't need the result from his MRI and his CT scan. Patient daughter asked can you please send her a message through Mychart.

## 2023-06-24 ENCOUNTER — Ambulatory Visit: Payer: Medicare Other | Admitting: Gastroenterology

## 2023-06-24 NOTE — Telephone Encounter (Signed)
I will send Dr. Johnney Killian recommendation through MyChart as requested by patient's daughter.

## 2023-07-05 ENCOUNTER — Telehealth: Payer: Self-pay

## 2023-07-05 NOTE — Telephone Encounter (Signed)
Copied from CRM [id number]. Topic: General - Other >> Jul 02, 2023  5:29 PM Epimenio Foot F wrote: Reason for CRM: Heather with [facility] is calling in because they decided not to admit pt because he didn't qualify.

## 2023-07-05 NOTE — Telephone Encounter (Signed)
Noted  

## 2023-07-09 ENCOUNTER — Inpatient Hospital Stay: Payer: Medicare Other | Admitting: Family Medicine

## 2023-07-26 ENCOUNTER — Ambulatory Visit (INDEPENDENT_AMBULATORY_CARE_PROVIDER_SITE_OTHER): Payer: Medicare Other | Admitting: Family Medicine

## 2023-07-26 ENCOUNTER — Other Ambulatory Visit: Payer: Self-pay | Admitting: Family Medicine

## 2023-07-26 DIAGNOSIS — K7030 Alcoholic cirrhosis of liver without ascites: Secondary | ICD-10-CM | POA: Diagnosis not present

## 2023-07-26 DIAGNOSIS — Z125 Encounter for screening for malignant neoplasm of prostate: Secondary | ICD-10-CM

## 2023-07-26 DIAGNOSIS — M791 Myalgia, unspecified site: Secondary | ICD-10-CM | POA: Diagnosis not present

## 2023-07-26 DIAGNOSIS — E512 Wernicke's encephalopathy: Secondary | ICD-10-CM

## 2023-07-26 MED ORDER — GABAPENTIN 100 MG PO CAPS
ORAL_CAPSULE | ORAL | 1 refills | Status: DC
Start: 2023-07-26 — End: 2023-11-08

## 2023-07-26 MED ORDER — NALTREXONE HCL 50 MG PO TABS
50.0000 mg | ORAL_TABLET | Freq: Every day | ORAL | 6 refills | Status: DC
Start: 2023-07-26 — End: 2024-03-23

## 2023-07-26 NOTE — Progress Notes (Signed)
Established patient visit   Patient: Mark Carr   DOB: 10/30/1956   67 y.o. Male  MRN: [id number] Visit Date: 07/26/2023  Today's healthcare provider: Mila Merry, MD   Chief Complaint  Patient presents with   Pain    Patient complains of pain all over, head, back, and stomach.  He also has been having shakes.  He has been taking Tylenol and Aleve are not helping the pain.  Symptoms have been present about 1-2 months and states it is worse at night. 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   Subjective    Discussed the use of AI scribe software for clinical note transcription with the patient, who gave verbal consent to proceed.  History of Present Illness   [name], a patient with a history of alcoholic cirrhosis, presents with generalized aches and pains that have been ongoing for a while. The pain is particularly located in the stomach, back, and legs. The leg pain is severe enough to prevent walking and led to an episode of collapse on the highway. This pain is worse at night, causing significant sleep disturbance. Over-the-counter pain relief with Tylenol and Aleve has been ineffective.  The patient also reports a history of alcohol abuse, admitting to drinking too much and substituting alcohol for water. Despite the pain, the patient experiences fatigue and a general feeling of being unwell.  The patient is currently taking thiamine and lactulose. There is a history of a prescription for naltrexone, a medication to help reduce alcohol consumption, but the patient has not been taking it.  The patient's pain is not localized to a specific area but is generalized across the stomach and back. There is also pain in the legs, particularly in the back of the legs, which is exacerbated by walking. The patient also reports occasional chest pain, described as similar to heartburn, and a strange sensation in the arms.        Medications: Outpatient Medications Prior to Visit  Medication Sig   lactulose (CHRONULAC) 10 GM/15ML solution Take 45 mLs (30 g total) by mouth 2 (two) times daily.   thiamine (VITAMIN B-1) 100 MG tablet Take 1 tablet (100 mg total) by mouth daily.   Multiple Vitamin (MULTIVITAMIN WITH MINERALS) TABS tablet Take 1 tablet by mouth daily. (Patient not taking: Reported on 07/26/2023)   No facility-administered medications prior to visit.   Review of Systems     Objective    Physical Exam    General: Appearance:    Thin male in no acute distress  Eyes:    PERRL, conjunctiva/corneas clear, EOM's intact       Lungs:     Clear to auscultation bilaterally, respirations unlabored  Heart:    Normal heart rate. Normal rhythm. No murmurs, rubs, or gallops.    MS:   All extremities are intact.  Mild diffuse tenderness of back Ues and Les.   Abdomen:   Mildly distended, mild epigastric tenderness.   Neurologic:   Awake, alert, oriented x 3. No apparent focal neurological defect.         Assessment & Plan        Chronic Pain Generalized pain, particularly in the stomach, back, and legs. Pain is worse at night and interferes with sleep. Current management with Tylenol and Aleve is insufficient. -Order comprehensive blood panel to check for potential causes of pain, including CK and PSA -Prescribe Gabapentin 100mg  capsules, with  instructions to take 1 capsule during the day if needed and 3 capsules at night.  Alcohol Use Disorder Patient admits to drinking too much alcohol, which may be contributing to his chronic pain and other health issues. Previously prescribed Naltrexone but did not take it. -Resend prescription for Naltrexone and strongly encourage patient to take it to help reduce alcohol consumption.  Cirrhosis Patient has a history of cirrhosis, which may be contributing to his chronic pain. Currently managed with Thiamine and Lactulose. -Continue current management with  Thiamine and Lactulose. -Consider potential impact of cirrhosis on patient's chronic pain and alcohol use disorder when interpreting results of blood panel.    No follow-ups on file.      Mila Merry, MD  [facility] [phone] (phone) [phone] (fax)  [facility]

## 2023-07-30 LAB — CK TOTAL AND CKMB (NOT AT ARMC)
CK-MB Index: 2.4 ng/mL (ref 0.0–10.4)
Total CK: 115 U/L (ref 41–331)

## 2023-07-30 LAB — B12 AND FOLATE PANEL
Folate: 5.2 ng/mL
Vitamin B-12: 844 pg/mL (ref 232–1245)

## 2023-07-30 LAB — VITAMIN B6: Vitamin B6: 6.9 ug/L (ref 3.4–65.2)

## 2023-07-30 LAB — PSA TOTAL (REFLEX TO FREE): Prostate Specific Ag, Serum: 1.1 ng/mL (ref 0.0–4.0)

## 2023-07-30 LAB — VITAMIN B1: Thiamine: 113.4 nmol/L (ref 66.5–200.0)

## 2023-08-10 ENCOUNTER — Ambulatory Visit: Payer: Medicare Other

## 2023-08-10 VITALS — Ht 68.0 in | Wt 120.0 lb

## 2023-08-10 DIAGNOSIS — Z0000 Encounter for general adult medical examination without abnormal findings: Secondary | ICD-10-CM

## 2023-08-10 NOTE — Patient Instructions (Signed)
Mark Carr , Thank you for taking time to come for your Medicare Wellness Visit. I appreciate your ongoing commitment to your health goals. Please review the following plan we discussed and let me know if I can assist you in the future.   Referrals/Orders/Follow-Ups/Clinician Recommendations: none  This is a list of the screening recommended for you and due dates:  Health Maintenance  Topic Date Due   DTaP/Tdap/Td vaccine (1 - Tdap) Never done   Zoster (Shingles) Vaccine (1 of 2) Never done   Colon Cancer Screening  Never done   Screening for Lung Cancer  Never done   COVID-19 Vaccine (3 - Moderna risk series) 08/29/2021   Pneumonia Vaccine (2 of 2 - PCV) 04/03/2022   Flu Shot  02/07/2024*   Medicare Annual Wellness Visit  08/09/2024   Hepatitis C Screening  Completed   HPV Vaccine  Aged Out  *Topic was postponed. The date shown is not the original due date.    Advanced directives: (In Chart) A copy of your advanced directives are scanned into your chart should your provider ever need it.  Next Medicare Annual Wellness Visit scheduled for next year: Yes 08/14/24 @ 8:15am telephone

## 2023-08-10 NOTE — Progress Notes (Signed)
Subjective:   Mark Carr is a 67 y.o. male who presents for Medicare Annual/Subsequent preventive examination.  Visit Complete: Virtual  I connected with  [name] on 08/10/23 by a audio enabled telemedicine application and verified that I am speaking with the correct person using two identifiers.  Patient Location: Home  Provider Location: Home Office  I discussed the limitations of evaluation and management by telemedicine. The patient expressed understanding and agreed to proceed.  Because this visit was a virtual/telehealth visit, some criteria may be missing or patient reported. Any vitals not documented were not able to be obtained and vitals that have been documented are patient reported.   Patient Medicare AWV questionnaire was completed by the patient on (not done); I have confirmed that all information answered by patient is correct and no changes since this date. Cardiac Risk Factors include: advanced age (>37men, >78 women);hypertension;male gender;smoking/ tobacco exposure    Objective:    Today's Vitals   08/10/23 0840 08/10/23 0842  Weight: 120 lb (54.4 kg)   Height: 5\' 8"  (1.727 m)   PainSc:  5    Body mass index is 18.25 kg/m.     08/10/2023    8:58 AM 06/14/2023    8:00 AM 06/13/2023   12:15 PM 05/18/2023    5:03 PM 03/25/2023    2:15 PM 02/15/2023    6:14 AM 01/07/2023    7:14 PM  Advanced Directives  Does Patient Have a Medical Advance Directive? Yes Yes No Yes No No No  Type of Estate agent of [place];Living will Healthcare Power of [employer] of [place];Living will     Does patient want to make changes to medical advance directive?  No - Patient declined       Copy of Healthcare Power of Attorney in Chart?  Yes - validated most recent copy scanned in chart (See row information)       Would patient like information on creating a medical advance directive?     No - Patient declined No - Patient declined      Current Medications (verified) Outpatient Encounter Medications as of 08/10/2023  Medication Sig   gabapentin (NEURONTIN) 100 MG capsule Take 1 capsule twice a day and 3 at night as needed for pain   lactulose (CHRONULAC) 10 GM/15ML solution Take 45 mLs (30 g total) by mouth 2 (two) times daily.   thiamine (VITAMIN B-1) 100 MG tablet Take 1 tablet (100 mg total) by mouth daily.   Multiple Vitamin (MULTIVITAMIN WITH MINERALS) TABS tablet Take 1 tablet by mouth daily. (Patient not taking: Reported on 07/26/2023)   naltrexone (DEPADE) 50 MG tablet Take 1 tablet (50 mg total) by mouth daily. (Patient not taking: Reported on 08/10/2023)   No facility-administered encounter medications on file as of 08/10/2023.    Allergies (verified) Patient has no known allergies.   History: Past Medical History:  Diagnosis Date        Acute encephalopathy 06/16/2021   Cirrhosis of liver (HCC)    Coag negative Staphylococcus bacteremia 04/21/2021   Wrist fracture    Past Surgical History:  Procedure Laterality Date   FRACTURE SURGERY     wrist   WRIST FRACTURE SURGERY Right 2016   Family History  Problem Relation Age of Onset   Hypertension Mother    Deep vein thrombosis Mother    Cancer Sister        unknown type   Heart disease Brother  Cancer Maternal Grandmother    Social History   Socioeconomic History   Marital status: Married    Spouse name: Not on file   Number of children: 4   Years of education: 11   Highest education level: 11th grade  Occupational History   Not on file  Tobacco Use   Smoking status: Every Day    Current packs/day: 0.50    Average packs/day: 0.5 packs/day for 40.0 years (20.0 ttl pk-yrs)    Types: Cigarettes   Smokeless tobacco: Never  Vaping Use   Vaping status: Never Used  Substance and Sexual Activity   Alcohol use: Yes    Alcohol/week: 4.0 standard drinks of alcohol    Types: 4 Cans of beer per week    Comment: daughter says he is heavy  daily drinker and that pt minimizes ETOH intake   Drug use: Not Currently   Sexual activity: Not Currently  Other Topics Concern   Not on file  Social History Narrative   Not on file   Social Determinants of Health   Financial Resource Strain: Low Risk  (08/10/2023)   Overall Financial Resource Strain (CARDIA)    Difficulty of Paying Living Expenses: Not hard at all  Food Insecurity: No Food Insecurity (08/10/2023)   Hunger Vital Sign    Worried About Running Out of Food in the Last Year: Never true    Ran Out of Food in the Last Year: Never true  Transportation Needs: No Transportation Needs (08/10/2023)   PRAPARE - Administrator, Civil Service (Medical): No    Lack of Transportation (Non-Medical): No  Physical Activity: Insufficiently Active (08/10/2023)   Exercise Vital Sign    Days of Exercise per Week: 3 days    Minutes of Exercise per Session: 20 min  Stress: No Stress Concern Present (08/10/2023)   [employer] of Occupational Health - Occupational Stress Questionnaire    Feeling of Stress : Not at all  Social Connections: Socially Isolated (08/10/2023)   Social Connection and Isolation Panel [NHANES]    Frequency of Communication with Friends and Family: Once a week    Frequency of Social Gatherings with Friends and Family: Once a week    Attends Religious Services: Never    Database administrator or Organizations: No    Attends Engineer, structural: Never    Marital Status: Married    Tobacco Counseling Ready to quit: Not Answered Counseling given: Not Answered   Clinical Intake:  Pre-visit preparation completed: Yes  Pain : 0-10 Pain Score: 5  Pain Type: Acute pain Pain Location: Buttocks Pain Descriptors / Indicators: Aching, Sharp Pain Onset: In the past 7 days Pain Frequency: Constant Pain Relieving Factors: heat pad,cream  Pain Relieving Factors: heat pad,cream  BMI - recorded: 18.25 Nutritional Status: BMI <19   Underweight Nutritional Risks: None Diabetes: No  How often do you need to have someone help you when you read instructions, pamphlets, or other written materials from your doctor or pharmacy?: 1 - Never  Interpreter Needed?: No  Comments: lives with wife Information entered by :: B.Saree Krogh,LPN   Activities of Daily Living    08/10/2023    8:58 AM 06/14/2023    8:00 AM  In your present state of health, do you have any difficulty performing the following activities:  Hearing? 0 0  Vision? 1 0  Difficulty concentrating or making decisions? 1 1  Walking or climbing stairs? 0 1  Dressing or bathing? 0 0  Doing errands, shopping? 0 0  Preparing Food and eating ? N   Using the Toilet? N   In the past six months, have you accidently leaked urine? Y   Do you have problems with loss of bowel control? N   Managing your Medications? N   Managing your Finances? N   Housekeeping or managing your Housekeeping? N     Patient Care Team: Erasmo Downer, MD as PCP - General (Family Medicine)  Indicate any recent Medical Services you may have received from other than [facility] providers in the past year (date may be approximate).     Assessment:   This is a routine wellness examination for Ramie.  Hearing/Vision screen Hearing Screening - Comments:: Pt says he can hear pretty good Vision Screening - Comments:: Pt says he does not see well:has cataracts but cannot have them removed right now   Goals Addressed               This Visit's Progress     "I do not have insurance, I need help with getting a colonoscopy" (pt-stated)   Not on track     Current Barriers:  Financial constraints Lacks knowledge of community resource: community healthcare  Clinical Social Work Clinical Goal(s):  Over the next 90 days, client will work with SW to address concerns related to community health care  Interventions: Patient interviewed and appropriate assessments performed Confirmed that  patient is currently uninsured and has been denied Medicaid twice in the last year Confirmed with patient that he is currently unemployed and currently receives Tree surgeon only Provided patient with information about applying for [facility] [phone] ext [phone] for assistance with getting needed screenings done Encouraged patient to call [facility] to complete the screening and paperwork required Encouraged patient to call this social worker back with questions or assistance with paperwork process Discussed plans with patient for ongoing care management follow up and provided patient with direct contact information for care management team  Patient Self Care Activities:  Performs ADL's independently Performs IADL's independently Calls provider office for new concerns or questions  Please see past updates related to this goal by clicking on the "Past Updates" button in the selected goal        DIET - EAT MORE FRUITS AND VEGETABLES   Not on track     Substance abuse treatment options   On track     Care Coordination Interventions: Substance abuse treatment options discussed-detox vs. Outpatient substance abuse treatment Positive reinforcement for safety modifications to maintain safety in the home-extra locks on doors Personal care services discussed as a possibility due to increased personal care needs Solution-Focused Strategies employed:  Active listening / Reflection utilized  Emotional Support Provided Caregiver stress acknowledged         Depression Screen    08/10/2023    8:56 AM 07/26/2023   10:17 AM 12/01/2022    9:45 AM 08/03/2022    9:05 AM 07/15/2022    8:19 AM 01/29/2022    2:36 PM 12/23/2021    3:18 PM  PHQ 2/9 Scores  PHQ - 2 Score 0 0 0 0 0 0 0  PHQ- 9 Score  6 0 0 1 0     Fall Risk    08/10/2023    8:47 AM 07/26/2023   10:17 AM 12/29/2022    2:44 PM 12/01/2022    9:45 AM 08/03/2022    9:07 AM  Fall Risk   Falls in  the past year? 1 1 1 1 1    Number falls in past yr: 1 1 1  0 1  Injury with Fall? 1 0 1 1 0  Risk for fall due to : No Fall Risks  History of fall(s);Impaired balance/gait History of fall(s);Impaired balance/gait History of fall(s)  Follow up Education provided;Falls prevention discussed  Falls evaluation completed;Education provided;Falls prevention discussed Falls evaluation completed;Education provided;Falls prevention discussed Falls evaluation completed;Falls prevention discussed    MEDICARE RISK AT HOME: Medicare Risk at Home Any stairs in or around the home?: Yes If so, are there any without handrails?: Yes Home free of loose throw rugs in walkways, pet beds, electrical cords, etc?: Yes Adequate lighting in your home to reduce risk of falls?: Yes Life alert?: No Use of a cane, walker or w/c?: No Grab bars in the bathroom?: No Shower chair or bench in shower?: Yes Elevated toilet seat or a handicapped toilet?: No  TIMED UP AND GO:  Was the test performed?  No    Cognitive Function:        08/10/2023    9:04 AM  6CIT Screen  What Year? 0 points  What month? 0 points  What time? 0 points  Count back from 20 2 points  Months in reverse 4 points  Repeat phrase 6 points  Total Score 12 points    Immunizations Immunization History  Administered Date(s) Administered   Fluad Quad(high Dose 65+) 12/23/2021, 08/03/2022   Moderna Sars-Covid-2 Vaccination 07/04/2021, 08/01/2021   Pneumococcal Polysaccharide-23 04/03/2021    TDAP status: Up to date  Flu Vaccine status: Due, Education has been provided regarding the importance of this vaccine. Advised may receive this vaccine at local pharmacy or Health Dept. Aware to provide a copy of the vaccination record if obtained from local pharmacy or Health Dept. Verbalized acceptance and understanding.  Pneumococcal vaccine status: Up to date  Covid-19 vaccine status: Completed vaccines  Qualifies for Shingles Vaccine? Yes   Zostavax completed No    Shingrix Completed?: No.    Education has been provided regarding the importance of this vaccine. Patient has been advised to call insurance company to determine out of pocket expense if they have not yet received this vaccine. Advised may also receive vaccine at local pharmacy or Health Dept. Verbalized acceptance and understanding.  Screening Tests Health Maintenance  Topic Date Due   DTaP/Tdap/Td (1 - Tdap) Never done   Zoster Vaccines- Shingrix (1 of 2) Never done   Colonoscopy  Never done   Lung Cancer Screening  Never done   COVID-19 Vaccine (3 - Moderna risk series) 08/29/2021   Pneumonia Vaccine 67+ Years old (2 of 2 - PCV) 04/03/2022   INFLUENZA VACCINE  02/07/2024 (Originally 06/10/2023)   Medicare Annual Wellness (AWV)  08/09/2024   Hepatitis C Screening  Completed   HPV VACCINES  Aged Out    Health Maintenance  Health Maintenance Due  Topic Date Due   DTaP/Tdap/Td (1 - Tdap) Never done   Zoster Vaccines- Shingrix (1 of 2) Never done   Colonoscopy  Never done   Lung Cancer Screening  Never done   COVID-19 Vaccine (3 - Moderna risk series) 08/29/2021   Pneumonia Vaccine 34+ Years old (2 of 2 - PCV) 04/03/2022    Colorectal cancer screening: Type of screening: Colonoscopy. Completed no. Repeat every 5-10 yearspt says he cannot get due to drinking habits  Lung Cancer Screening: (Low Dose CT Chest recommended if Age 23-80 years, 20 pack-year currently smoking OR  have quit w/in 15years.) does qualify.   Lung Cancer Screening Referral: no pt declines  Additional Screening:  Hepatitis C Screening: does not qualify; Completed yes  Vision Screening: Recommended annual ophthalmology exams for early detection of glaucoma and other disorders of the eye. Is the patient up to date with their annual eye exam?  Yes  Who is the provider or what is the name of the office in which the patient attends annual eye exams? Dr Bell/[facility] If pt is not established with a provider,  would they like to be referred to a provider to establish care? No .   Dental Screening: Recommended annual dental exams for proper oral hygiene  Diabetic Foot Exam: n/a  Community Resource Referral / Chronic Care Management: CRR required this visit?  No   CCM required this visit?  No    Plan:     I have personally reviewed and noted the following in the patient's chart:   Medical and social history Use of alcohol, tobacco or illicit drugs  Current medications and supplements including opioid prescriptions. Patient is not currently taking opioid prescriptions. Functional ability and status Nutritional status Physical activity Advanced directives List of other physicians Hospitalizations, surgeries, and ER visits in previous 12 months Vitals Screenings to include cognitive, depression, and falls Referrals and appointments  In addition, I have reviewed and discussed with patient certain preventive protocols, quality metrics, and best practice recommendations. A written personalized care plan for preventive services as well as general preventive health recommendations were provided to patient.     Sue Lush, LPN   28/02/1323   After Visit Summary: (MyChart) Due to this being a telephonic visit, the after visit summary with patients personalized plan was offered to patient via MyChart   Nurse Notes: I spoke with patient on speaker with his wife in background providing answers pt did not remember. She relays pt needs colonoscopy and eye surgery but his doctors have deemed not safe due to his drinking. Pt also relays he urinates a lot :his wife indicates is due to "drinking". Pt says he drinks beer. Pt relays he has a boil on his buttock near rectum that has came up in the last week. I encourage pt to make an appt to be seen. Pt and wife says they will call back when their daughter comes (as she has to take him to appt).

## 2023-09-09 ENCOUNTER — Ambulatory Visit: Admit: 2023-09-09 | Payer: Medicare Other | Admitting: Ophthalmology

## 2023-09-09 SURGERY — PHACOEMULSIFICATION, CATARACT, WITH IOL INSERTION
Anesthesia: Topical | Laterality: Right

## 2023-09-23 ENCOUNTER — Ambulatory Visit: Admit: 2023-09-23 | Payer: Medicare Other | Admitting: Ophthalmology

## 2023-09-23 SURGERY — PHACOEMULSIFICATION, CATARACT, WITH IOL INSERTION
Anesthesia: Topical | Laterality: Left

## 2023-10-28 ENCOUNTER — Ambulatory Visit: Payer: Self-pay

## 2023-10-28 NOTE — Telephone Encounter (Signed)
Noted  

## 2023-10-28 NOTE — Telephone Encounter (Signed)
Patient's daughter [name] called, left VM to return the call to the office to speak to the NT.   Summary: Confusion, talking to himself a lot lately   Confusion, talking to himself a lot lately... hx of being a drinker, intoxication.

## 2023-10-28 NOTE — Telephone Encounter (Signed)
    Chief Complaint: Daughter reports pt. Has been drinking beer heavily and having confusion and hallucinations."I'm thinking his ammonia level is up." Pt. Is not combative. Symptoms: Above Frequency: Worse this week Pertinent Negatives: Patient denies  Disposition: [x] ED /[] Urgent Care (no appt availability in office) / [] Appointment(In office/virtual)/ []  [facility]/ [] Home Care/ [] Refused Recommended Disposition /[] [facility]/ []  Follow-up with PCP Additional Notes: "My sister and I decided to take him to the ED."  Reason for Disposition  Patient sounds very sick or weak to the triager  Answer Assessment - Initial Assessment Questions 1. ALCOHOL USE: "Do you drink alcohol, including beer, wine or hard liquor?"     Beer 2. HOW OFTEN: "How many days per week do you typically drink alcohol?"     Daily 3. HOW MUCH: "How many drinks do you typically have on days when you drink?" (1.5 oz hard liquor [one shot or jigger; 45 ml], 5 oz wine [small glass; 150 ml], 12 oz beer [one can; 360 ml])     Unsure 4. MOST: "What is the most that you have had to drink on any one occasion in the last month?"     Unsure 5. LAST 24 HOURS: "Have you had a drink within the last 24 hours?"     Yes 6. DRINKING PROBLEM: "Do you have or have you ever had an alcohol drinking problem?"     Yes 7. DRUG PROBLEM: "Are you using any other drugs?" (e.g., yes/no; cocaine, prescription medicines, etc.)     No 8. SYMPTOMS: "What symptoms are you currently experiencing?" (e.g., none, tremors, or shakiness, abdomen pain, vomiting, blackout spells)     Hallucinations 9. DETOX PROGRAM: "Have you ever gone through a detox program?"     No 10. THERAPIST: "Do you have a counselor or therapist? Name?"       No 11. SUPPORT: "Who is with you now?" "Who do you live with?" "Do you have family or friends who you can talk to?" "Are you a member of Alcoholics Anonymous?"       Lives with wife 12. PREGNANCY:  "Is there any chance you are pregnant?" "When was your last menstrual period?"       N/a  Protocols used: Alcohol Use and Problems-A-AH

## 2023-11-05 ENCOUNTER — Other Ambulatory Visit: Payer: Self-pay | Admitting: Family Medicine

## 2023-11-05 DIAGNOSIS — M791 Myalgia, unspecified site: Secondary | ICD-10-CM

## 2023-11-11 DIAGNOSIS — G8929 Other chronic pain: Secondary | ICD-10-CM | POA: Diagnosis not present

## 2023-11-11 DIAGNOSIS — F17210 Nicotine dependence, cigarettes, uncomplicated: Secondary | ICD-10-CM | POA: Diagnosis not present

## 2023-11-15 ENCOUNTER — Ambulatory Visit: Payer: Medicare Other | Admitting: Family Medicine

## 2023-11-25 DIAGNOSIS — K7460 Unspecified cirrhosis of liver: Secondary | ICD-10-CM | POA: Diagnosis not present

## 2023-11-25 DIAGNOSIS — D696 Thrombocytopenia, unspecified: Secondary | ICD-10-CM | POA: Diagnosis not present

## 2023-11-25 DIAGNOSIS — F17210 Nicotine dependence, cigarettes, uncomplicated: Secondary | ICD-10-CM | POA: Diagnosis not present

## 2023-11-27 DIAGNOSIS — G8929 Other chronic pain: Secondary | ICD-10-CM | POA: Diagnosis not present

## 2023-12-06 DIAGNOSIS — R531 Weakness: Secondary | ICD-10-CM | POA: Diagnosis not present

## 2023-12-06 DIAGNOSIS — G8929 Other chronic pain: Secondary | ICD-10-CM | POA: Diagnosis not present

## 2023-12-06 DIAGNOSIS — R296 Repeated falls: Secondary | ICD-10-CM | POA: Diagnosis not present

## 2023-12-06 DIAGNOSIS — E43 Unspecified severe protein-calorie malnutrition: Secondary | ICD-10-CM | POA: Diagnosis not present

## 2023-12-24 DIAGNOSIS — K760 Fatty (change of) liver, not elsewhere classified: Secondary | ICD-10-CM | POA: Diagnosis not present

## 2023-12-24 DIAGNOSIS — G8929 Other chronic pain: Secondary | ICD-10-CM | POA: Diagnosis not present

## 2023-12-27 DIAGNOSIS — F17210 Nicotine dependence, cigarettes, uncomplicated: Secondary | ICD-10-CM | POA: Diagnosis not present

## 2024-01-06 DIAGNOSIS — R296 Repeated falls: Secondary | ICD-10-CM | POA: Diagnosis not present

## 2024-01-06 DIAGNOSIS — G8929 Other chronic pain: Secondary | ICD-10-CM | POA: Diagnosis not present

## 2024-01-06 DIAGNOSIS — E43 Unspecified severe protein-calorie malnutrition: Secondary | ICD-10-CM | POA: Diagnosis not present

## 2024-01-06 DIAGNOSIS — R531 Weakness: Secondary | ICD-10-CM | POA: Diagnosis not present

## 2024-01-24 DIAGNOSIS — J309 Allergic rhinitis, unspecified: Secondary | ICD-10-CM | POA: Diagnosis not present

## 2024-01-27 DIAGNOSIS — K760 Fatty (change of) liver, not elsewhere classified: Secondary | ICD-10-CM | POA: Diagnosis not present

## 2024-01-27 DIAGNOSIS — G8929 Other chronic pain: Secondary | ICD-10-CM | POA: Diagnosis not present

## 2024-02-09 DIAGNOSIS — R1084 Generalized abdominal pain: Secondary | ICD-10-CM | POA: Diagnosis not present

## 2024-02-16 ENCOUNTER — Inpatient Hospital Stay
Admission: EM | Admit: 2024-02-16 | Discharge: 2024-02-18 | DRG: 308 | Disposition: A | Discharge status: Home Health | Attending: Internal Medicine | Admitting: Internal Medicine

## 2024-02-16 ENCOUNTER — Other Ambulatory Visit: Payer: Self-pay

## 2024-02-16 DIAGNOSIS — D649 Anemia, unspecified: Secondary | ICD-10-CM

## 2024-02-16 DIAGNOSIS — R000 Tachycardia, unspecified: Secondary | ICD-10-CM | POA: Diagnosis not present

## 2024-02-16 DIAGNOSIS — Z515 Encounter for palliative care: Secondary | ICD-10-CM | POA: Diagnosis not present

## 2024-02-16 DIAGNOSIS — Z555 Less than a high school diploma: Secondary | ICD-10-CM | POA: Diagnosis not present

## 2024-02-16 DIAGNOSIS — F10220 Alcohol dependence with intoxication, uncomplicated: Secondary | ICD-10-CM | POA: Diagnosis present

## 2024-02-16 DIAGNOSIS — R296 Repeated falls: Secondary | ICD-10-CM | POA: Diagnosis present

## 2024-02-16 DIAGNOSIS — D696 Thrombocytopenia, unspecified: Secondary | ICD-10-CM

## 2024-02-16 DIAGNOSIS — Z66 Do not resuscitate: Secondary | ICD-10-CM | POA: Diagnosis present

## 2024-02-16 DIAGNOSIS — I483 Typical atrial flutter: Secondary | ICD-10-CM | POA: Diagnosis not present

## 2024-02-16 DIAGNOSIS — G8929 Other chronic pain: Secondary | ICD-10-CM | POA: Diagnosis not present

## 2024-02-16 DIAGNOSIS — E860 Dehydration: Secondary | ICD-10-CM | POA: Diagnosis not present

## 2024-02-16 DIAGNOSIS — F17200 Nicotine dependence, unspecified, uncomplicated: Secondary | ICD-10-CM

## 2024-02-16 DIAGNOSIS — Y905 Blood alcohol level of 100-119 mg/100 ml: Secondary | ICD-10-CM | POA: Diagnosis present

## 2024-02-16 DIAGNOSIS — Z8249 Family history of ischemic heart disease and other diseases of the circulatory system: Secondary | ICD-10-CM

## 2024-02-16 DIAGNOSIS — Z79899 Other long term (current) drug therapy: Secondary | ICD-10-CM

## 2024-02-16 DIAGNOSIS — K7040 Alcoholic hepatic failure without coma: Secondary | ICD-10-CM | POA: Diagnosis present

## 2024-02-16 DIAGNOSIS — R531 Weakness: Secondary | ICD-10-CM

## 2024-02-16 DIAGNOSIS — Z91148 Patient's other noncompliance with medication regimen for other reason: Secondary | ICD-10-CM

## 2024-02-16 DIAGNOSIS — D6959 Other secondary thrombocytopenia: Secondary | ICD-10-CM | POA: Diagnosis present

## 2024-02-16 DIAGNOSIS — I4891 Unspecified atrial fibrillation: Secondary | ICD-10-CM | POA: Diagnosis present

## 2024-02-16 DIAGNOSIS — R519 Headache, unspecified: Secondary | ICD-10-CM | POA: Diagnosis not present

## 2024-02-16 DIAGNOSIS — F17210 Nicotine dependence, cigarettes, uncomplicated: Secondary | ICD-10-CM | POA: Diagnosis present

## 2024-02-16 DIAGNOSIS — D7589 Other specified diseases of blood and blood-forming organs: Secondary | ICD-10-CM | POA: Diagnosis present

## 2024-02-16 DIAGNOSIS — E43 Unspecified severe protein-calorie malnutrition: Secondary | ICD-10-CM | POA: Diagnosis present

## 2024-02-16 DIAGNOSIS — E871 Hypo-osmolality and hyponatremia: Secondary | ICD-10-CM | POA: Diagnosis present

## 2024-02-16 DIAGNOSIS — K7030 Alcoholic cirrhosis of liver without ascites: Secondary | ICD-10-CM | POA: Diagnosis present

## 2024-02-16 DIAGNOSIS — R413 Other amnesia: Secondary | ICD-10-CM | POA: Diagnosis present

## 2024-02-16 DIAGNOSIS — Z6821 Body mass index (BMI) 21.0-21.9, adult: Secondary | ICD-10-CM

## 2024-02-16 DIAGNOSIS — F0390 Unspecified dementia without behavioral disturbance: Secondary | ICD-10-CM | POA: Diagnosis present

## 2024-02-16 DIAGNOSIS — I4892 Unspecified atrial flutter: Secondary | ICD-10-CM | POA: Diagnosis present

## 2024-02-16 DIAGNOSIS — K625 Hemorrhage of anus and rectum: Secondary | ICD-10-CM | POA: Diagnosis present

## 2024-02-16 DIAGNOSIS — Z1152 Encounter for screening for COVID-19: Secondary | ICD-10-CM | POA: Diagnosis not present

## 2024-02-16 DIAGNOSIS — F10920 Alcohol use, unspecified with intoxication, uncomplicated: Secondary | ICD-10-CM

## 2024-02-16 LAB — ETHANOL: Alcohol, Ethyl (B): 106 mg/dL — ABNORMAL HIGH (ref <10)

## 2024-02-16 LAB — CBC WITH DIFFERENTIAL/PLATELET
Abs Immature Granulocytes: 0.02 10*3/uL (ref 0.00–0.07)
Basophils Absolute: 0 10*3/uL (ref 0.0–0.1)
Basophils Relative: 0 %
Eosinophils Absolute: 0 10*3/uL (ref 0.0–0.5)
Eosinophils Relative: 0 %
HCT: 27.7 % — ABNORMAL LOW (ref 39.0–52.0)
Hemoglobin: 10.2 g/dL — ABNORMAL LOW (ref 13.0–17.0)
Immature Granulocytes: 0 %
Lymphocytes Relative: 15 %
Lymphs Abs: 1 10*3/uL (ref 0.7–4.0)
MCH: 40.2 pg — ABNORMAL HIGH (ref 26.0–34.0)
MCHC: 36.8 g/dL — ABNORMAL HIGH (ref 30.0–36.0)
MCV: 109.1 fL — ABNORMAL HIGH (ref 80.0–100.0)
Monocytes Absolute: 0.4 10*3/uL (ref 0.1–1.0)
Monocytes Relative: 7 %
Neutro Abs: 5.3 10*3/uL (ref 1.7–7.7)
Neutrophils Relative %: 78 %
Platelets: 44 10*3/uL — ABNORMAL LOW (ref 150–400)
RBC: 2.54 MIL/uL — ABNORMAL LOW (ref 4.22–5.81)
RDW: 19.1 % — ABNORMAL HIGH (ref 11.5–15.5)
WBC: 6.8 10*3/uL (ref 4.0–10.5)
nRBC: 0 % (ref 0.0–0.2)

## 2024-02-16 LAB — COMPREHENSIVE METABOLIC PANEL WITH GFR
ALT: 50 U/L — ABNORMAL HIGH (ref 0–44)
AST: 157 U/L — ABNORMAL HIGH (ref 15–41)
Albumin: 1.9 g/dL — ABNORMAL LOW (ref 3.5–5.0)
Alkaline Phosphatase: 88 U/L (ref 38–126)
Anion gap: 10 (ref 5–15)
BUN: 6 mg/dL — ABNORMAL LOW (ref 8–23)
CO2: 20 mmol/L — ABNORMAL LOW (ref 22–32)
Calcium: 7.8 mg/dL — ABNORMAL LOW (ref 8.9–10.3)
Chloride: 97 mmol/L — ABNORMAL LOW (ref 98–111)
Creatinine, Ser: 0.56 mg/dL — ABNORMAL LOW (ref 0.61–1.24)
GFR, Estimated: >60 mL/min (ref >60)
Glucose, Bld: 98 mg/dL (ref 70–99)
Potassium: 3.6 mmol/L (ref 3.5–5.1)
Sodium: 127 mmol/L — ABNORMAL LOW (ref 135–145)
Total Bilirubin: 8.3 mg/dL — ABNORMAL HIGH (ref 0.0–1.2)
Total Protein: 7 g/dL (ref 6.5–8.1)

## 2024-02-16 LAB — RESP PANEL BY RT-PCR (RSV, FLU A&B, COVID)  RVPGX2
Influenza A by PCR: NEGATIVE
Influenza B by PCR: NEGATIVE
Resp Syncytial Virus by PCR: NEGATIVE
SARS Coronavirus 2 by RT PCR: NEGATIVE

## 2024-02-16 LAB — LIPASE, BLOOD: Lipase: 35 U/L (ref 11–51)

## 2024-02-16 LAB — TROPONIN I (HIGH SENSITIVITY): Troponin I (High Sensitivity): 5 ng/L (ref <18)

## 2024-02-16 LAB — AMMONIA: Ammonia: 41 umol/L — ABNORMAL HIGH (ref 9–35)

## 2024-02-16 MED ORDER — ALBUTEROL SULFATE (2.5 MG/3ML) 0.083% IN NEBU
2.5000 mg | INHALATION_SOLUTION | Freq: Four times a day (QID) | RESPIRATORY_TRACT | Status: DC
  Administered 2024-02-16 – 2024-02-18 (×8): 2.5 mg via RESPIRATORY_TRACT
  Filled 2024-02-16 (×9): qty 3

## 2024-02-16 MED ORDER — ONDANSETRON HCL 4 MG PO TABS
4.0000 mg | ORAL_TABLET | Freq: Four times a day (QID) | ORAL | Status: DC | PRN
Start: 2024-02-16 — End: 2024-02-18

## 2024-02-16 MED ORDER — ADULT MULTIVITAMIN W/MINERALS CH
1.0000 | ORAL_TABLET | Freq: Every day | ORAL | Status: DC
  Administered 2024-02-16 – 2024-02-18 (×3): 1 via ORAL
  Filled 2024-02-16 (×3): qty 1

## 2024-02-16 MED ORDER — DILTIAZEM HCL 25 MG/5ML IV SOLN
15.0000 mg | Freq: Once | INTRAVENOUS | Status: AC
  Administered 2024-02-16: 15 mg via INTRAVENOUS
  Filled 2024-02-16: qty 5

## 2024-02-16 MED ORDER — LORAZEPAM 1 MG PO TABS
1.0000 mg | ORAL_TABLET | ORAL | Status: DC | PRN
  Administered 2024-02-16 (×3): 1 mg via ORAL
  Filled 2024-02-16 (×3): qty 1

## 2024-02-16 MED ORDER — AMIODARONE LOAD VIA INFUSION
150.0000 mg | Freq: Once | INTRAVENOUS | Status: AC
  Administered 2024-02-16: 150 mg via INTRAVENOUS
  Filled 2024-02-16: qty 83.34

## 2024-02-16 MED ORDER — DILTIAZEM HCL-DEXTROSE 125-5 MG/125ML-% IV SOLN (PREMIX)
5.0000 mg/h | INTRAVENOUS | Status: DC
  Administered 2024-02-16: 5 mg/h via INTRAVENOUS
  Filled 2024-02-16: qty 125

## 2024-02-16 MED ORDER — LACTATED RINGERS IV BOLUS
1000.0000 mL | Freq: Once | INTRAVENOUS | Status: AC
  Administered 2024-02-16: 1000 mL via INTRAVENOUS

## 2024-02-16 MED ORDER — THIAMINE MONONITRATE 100 MG PO TABS
100.0000 mg | ORAL_TABLET | Freq: Every day | ORAL | Status: DC
  Administered 2024-02-16 – 2024-02-18 (×3): 100 mg via ORAL
  Filled 2024-02-16 (×3): qty 1

## 2024-02-16 MED ORDER — FOLIC ACID 1 MG PO TABS
1.0000 mg | ORAL_TABLET | Freq: Every day | ORAL | Status: DC
  Administered 2024-02-16 – 2024-02-18 (×3): 1 mg via ORAL
  Filled 2024-02-16 (×3): qty 1

## 2024-02-16 MED ORDER — AMIODARONE HCL IN DEXTROSE 360-4.14 MG/200ML-% IV SOLN
60.0000 mg/h | INTRAVENOUS | Status: DC
  Administered 2024-02-16 (×2): 60 mg/h via INTRAVENOUS
  Filled 2024-02-16: qty 200

## 2024-02-16 MED ORDER — NICOTINE 14 MG/24HR TD PT24
14.0000 mg | MEDICATED_PATCH | Freq: Every day | TRANSDERMAL | Status: DC
  Administered 2024-02-16 – 2024-02-18 (×3): 14 mg via TRANSDERMAL
  Filled 2024-02-16 (×3): qty 1

## 2024-02-16 MED ORDER — LORAZEPAM 2 MG/ML IJ SOLN
1.0000 mg | INTRAMUSCULAR | Status: DC | PRN
  Administered 2024-02-16: 2 mg via INTRAVENOUS
  Filled 2024-02-16 (×2): qty 1

## 2024-02-16 MED ORDER — AMIODARONE HCL IN DEXTROSE 360-4.14 MG/200ML-% IV SOLN
30.0000 mg/h | INTRAVENOUS | Status: DC
Start: 2024-02-16 — End: 2024-02-18
  Administered 2024-02-16 – 2024-02-18 (×4): 30 mg/h via INTRAVENOUS
  Filled 2024-02-16 (×3): qty 200

## 2024-02-16 MED ORDER — ONDANSETRON HCL 4 MG/2ML IJ SOLN: 4.0000 mg | Freq: Four times a day (QID) | INTRAMUSCULAR | Status: DC | PRN

## 2024-02-16 MED ORDER — THIAMINE HCL 100 MG/ML IJ SOLN: 100.0000 mg | Freq: Every day | INTRAMUSCULAR | Status: DC

## 2024-02-16 MED ORDER — IBUPROFEN 400 MG PO TABS
200.0000 mg | ORAL_TABLET | Freq: Three times a day (TID) | ORAL | Status: DC | PRN
  Administered 2024-02-16: 200 mg via ORAL
  Filled 2024-02-16: qty 1

## 2024-02-16 NOTE — ED Triage Notes (Signed)
 Pt arrives from home with via [facility] with c/o headache that's been going on for 2-3 days. Pt also reports to EMS that they've been drinking 12-18 beers/day for the last 3 weeks. Pt is on palliative care for cirrhosis of the liver and they are working on getting hospice set up. Pt received 10g D10 and 1g of tylenol en route to ED. Pt HR was between 150-170 bpm and pt received 1L of LR and HR came down to 130s. Pt was afebrile and received the tylenol for the headache. Pt is A&Ox3 during triage. NAD noted.

## 2024-02-16 NOTE — ED Notes (Signed)
 Pt family, [name] called and updated on pts status.

## 2024-02-16 NOTE — ED Notes (Signed)
 Called CCMD to add pt to monitoring.

## 2024-02-16 NOTE — ED Notes (Signed)
 This RN received a note from the secretary to give pt's daughter a call and pt was okay with passing information along to her. Daughter was updated on pt's status and daughter told this RN that pt's son was on their way up to see the pt.  Daughter also said that if they need someone to come and sit with the pt to keep him in line to give them a call and someone will come right up.

## 2024-02-16 NOTE — Discharge Instructions (Signed)

## 2024-02-16 NOTE — H&P (Incomplete)
 History and Physical    Patient: Mark Carr:[id number] DOB: 25-Feb-1956 DOA: 02/16/2024 DOS: the patient was seen and examined on 02/16/2024 PCP: Erasmo Downer, MD  Patient coming from: Home  Chief Complaint:  Chief Complaint  Patient presents with   Headache   Alcohol Intoxication   HPI: Mark Carr is a 68 y.o. male with medical history significant of ***  Review of Systems: As mentioned in the history of present illness. All other systems reviewed and are negative. Past Medical History:  Diagnosis Date        Acute encephalopathy 06/16/2021   Cirrhosis of liver (HCC)    Coag negative Staphylococcus bacteremia 04/21/2021   Wrist fracture    Past Surgical History:  Procedure Laterality Date   FRACTURE SURGERY     wrist   WRIST FRACTURE SURGERY Right 2016   Social History:  reports that he has been smoking cigarettes. He has a 20 pack-year smoking history. He has never used smokeless tobacco. He reports current alcohol use of about 4.0 standard drinks of alcohol per week. He reports that he does not currently use drugs.  No Known Allergies  Family History  Problem Relation Age of Onset   Hypertension Mother    Deep vein thrombosis Mother    Cancer Sister        unknown type   Heart disease Brother    Cancer Maternal Grandmother     Prior to Admission medications   Medication Sig Start Date End Date Taking? Authorizing Provider  gabapentin (NEURONTIN) 100 MG capsule TAKE 1 CAPSULE BY MOUTH 2 TIMES A DAY AND TAKE THREE CAPSULES BY MOUTH EVERY NIGHT AT BEDTIME AS NEEDED FOR PAIN 11/08/23  Yes Malva Limes, MD  QUEtiapine (SEROQUEL) 25 MG tablet Take 25 mg by mouth at bedtime. 02/15/24  Yes [provider]  cephALEXin (KEFLEX) 500 MG capsule Take 500 mg by mouth 3 (three) times daily. Patient not taking: Reported on 02/16/2024 11/11/23   [provider]  lactulose (CHRONULAC) 10 GM/15ML solution Take 45 mLs (30 g total) by mouth 2 (two)  times daily. Patient not taking: Reported on 02/16/2024 03/25/23   Wyline Mood, MD  Multiple Vitamin (MULTIVITAMIN WITH MINERALS) TABS tablet Take 1 tablet by mouth daily. Patient not taking: Reported on 07/26/2023 06/15/23   Arnetha Courser, MD  naltrexone (DEPADE) 50 MG tablet Take 1 tablet (50 mg total) by mouth daily. Patient not taking: Reported on 08/10/2023 07/26/23   Malva Limes, MD  thiamine (VITAMIN B-1) 100 MG tablet Take 1 tablet (100 mg total) by mouth daily. Patient not taking: Reported on 02/16/2024 06/15/23   Arnetha Courser, MD    Physical Exam: Vitals:   02/16/24 1130 02/16/24 1145 02/16/24 1200 02/16/24 1230  BP: (!) 87/67 (!) 87/67 100/65 107/74  Pulse: (!) 153 (!) 152 (!) 151 (!) 156  Resp: (!) 22 (!) 24 17 18   Temp:      TempSrc:      SpO2: 91% 91% 95% 97%  Weight:      Height:       *** Data Reviewed: {Tip this will not be part of the note when signed- Document your independent interpretation of telemetry tracing, EKG, lab, Radiology test or any other diagnostic tests. Add any new diagnostic test ordered today. (Optional):[id number]}    Latest Ref Rng & Units 02/16/2024    8:29 AM 06/14/2023    5:19 AM 06/13/2023   12:17 PM  CBC  WBC 4.0 -  10.5 K/uL 6.8  1.9  3.0   Hemoglobin 13.0 - 17.0 g/dL 65.7  84.6  96.2   Hematocrit 39.0 - 52.0 % 27.7  33.4  40.1   Platelets 150 - 400 K/uL 44  56  86     No results found.    Latest Ref Rng & Units 02/16/2024    8:29 AM 06/14/2023    5:19 AM 06/13/2023   12:17 PM  CMP  Glucose 70 - 99 mg/dL 98  76  91   BUN 8 - 23 mg/dL 6  5  6    Creatinine 0.61 - 1.24 mg/dL 9.52  8.41  3.24   Sodium 135 - 145 mmol/L 127  137  139   Potassium 3.5 - 5.1 mmol/L 3.6  3.5  4.2   Chloride 98 - 111 mmol/L 97  103  101   CO2 22 - 32 mmol/L 20  20  22    Calcium 8.9 - 10.3 mg/dL 7.8  7.9  8.6   Total Protein 6.5 - 8.1 g/dL 7.0  5.7  8.1   Total Bilirubin 0.0 - 1.2 mg/dL 8.3  1.6  2.8   Alkaline Phos 38 - 126 U/L 88  99  124   AST 15 - 41 U/L 157  81   108   ALT 0 - 44 U/L 50  29  39      Assessment and Plan: No notes have been filed under this hospital service. Service: Hospitalist     Advance Care Planning:   Code Status: Limited: Do not attempt resuscitation (DNR) -DNR-LIMITED -Do Not Intubate/DNI  confirmed with patient and daughters at bedside. He is followed by palliative outpatient.  Consults: Cardiology, Palliative.  Family Communication: Discussed with both daughters at bedside.  Severity of Illness: The appropriate patient status for this patient is INPATIENT. Inpatient status is judged to be reasonable and necessary in order to provide the required intensity of service to ensure the patient's safety. The patient's presenting symptoms, physical exam findings, and initial radiographic and laboratory data in the context of their chronic comorbidities is felt to place them at high risk for further clinical deterioration. Furthermore, it is not anticipated that the patient will be medically stable for discharge from the hospital within 2 midnights of admission.   * I certify that at the point of admission it is my clinical judgment that the patient will require inpatient hospital care spanning beyond 2 midnights from the point of admission due to high intensity of service, high risk for further deterioration and high frequency of surveillance required.*  Author: Marcelino Duster, MD 02/16/2024 12:38 PM  For on call review [email or web address].

## 2024-02-16 NOTE — ED Notes (Signed)
Cardiology team to bedside.

## 2024-02-16 NOTE — TOC Initial Note (Signed)
 Transition of Care [facility] - Initial/Assessment Note    Patient Details  Name: Mark Carr MRN: [id number] Date of Birth: 1956-01-17  [facility] CM/SW Contact:    Colin Broach, LCSW Phone Number: 02/16/2024, 6:38 PM  Clinical Narrative:   CSW met with patient at bedside to complete initial assessment.  CSW introduced self and reason for visit.  Patient reports that his PCP is Dr. Shirlee Latch and that he uses [facility].  He has no concerns about paying for his medications.  Patient lives with his wife, Mark Carr.  He reports having the following DME:  cane and shower chair.  At discharge, one of his children will transport him home.  SUD resources added to AVS.                Expected Discharge Plan: Home/Self Care Barriers to Discharge: Continued Medical Work up   Patient Goals and CMS Choice            Expected Discharge Plan and Services                                              Prior Living Arrangements/Services   Lives with:: Spouse Patient language and need for interpreter reviewed:: Yes Do you feel safe going back to the place where you live?: Yes      Need for Family Participation in Patient Care: No (Comment) Care giver support system in place?: Yes (comment) Current home services: DME Criminal Activity/Legal Involvement Pertinent to Current Situation/Hospitalization: No - Comment as needed  Activities of Daily Living      Permission Sought/Granted                  Emotional Assessment Appearance:: Appears stated age Attitude/Demeanor/Rapport: Engaged Affect (typically observed): Appropriate Orientation: : Oriented to Self, Oriented to Place, Oriented to  Time, Oriented to Situation Alcohol / Substance Use: Not Applicable Psych Involvement: No (comment)  Admission diagnosis:  Atrial fibrillation with RVR (HCC) [I48.91] Patient Active Problem List   Diagnosis Date Noted   Atrial fibrillation with RVR (HCC)  02/16/2024   Chronic anemia 02/16/2024   Sinus tachycardia 06/14/2023   Abdominal pain 06/14/2023   Alcohol withdrawal syndrome, with delirium (HCC) 06/14/2023   Acute hypoactive alcohol withdrawal delirium (HCC) 06/13/2023   Acute metabolic encephalopathy 06/13/2023   Acute hepatic encephalopathy (HCC) 06/13/2023   Chronic bilateral low back pain without sciatica 12/29/2022   Dark stools 12/01/2022   Frequent falls 07/15/2022   Generally unsteady 07/15/2022   Alcohol use disorder, moderate, dependence (HCC) 01/29/2022   Thrombocytopenia (HCC) 01/29/2022   Hypertension 01/29/2022   Memory loss or impairment 07/29/2021   PRES (posterior reversible encephalopathy syndrome) 07/29/2021   Essential hypertension 07/29/2021   Wernicke encephalopathy 06/16/2021   High anion gap metabolic acidosis 06/16/2021   Alcoholic cirrhosis of liver without ascites (HCC) 06/16/2021   Protein-calorie malnutrition, severe 04/02/2021   Cirrhosis of liver (HCC)    Generalized weakness    Tobacco use disorder 05/18/2019   Macrocytosis 05/18/2019   Steatosis of liver 05/18/2019   Chronic fatigue 05/18/2019   PCP:  Erasmo Downer, MD Pharmacy:   [facility] [id number] [place], [place] - [address] [place] [address] Phone: [phone] Fax: [phone]     Social Drivers of Health (SDOH) Social History: SDOH  Screenings   Food Insecurity: No Food Insecurity (08/10/2023)  Housing: Patient Unable To Answer (08/10/2023)  Transportation Needs: No Transportation Needs (08/10/2023)  Utilities: Not At Risk (08/10/2023)  Alcohol Screen: High Risk (08/10/2023)  Depression (PHQ2-9): Low Risk  (08/10/2023)  Recent Concern: Depression (PHQ2-9) - Medium Risk (07/26/2023)  Financial Resource Strain: Low Risk  (08/10/2023)  Physical Activity: Insufficiently Active (08/10/2023)  Social Connections: Socially Isolated (08/10/2023)  Stress: No Stress Concern Present (08/10/2023)   Tobacco Use: High Risk (08/10/2023)  Health Literacy: Adequate Health Literacy (08/10/2023)   SDOH Interventions:     Readmission Risk Interventions     No data to display

## 2024-02-16 NOTE — ED Notes (Signed)
 RN looked up at monitors and saw that the pts cardiac leads had been removed, as well as his pulse oximetry. Pt found to be restless in bed, moving back and forth, sweating, and endorsing itching, with headache.

## 2024-02-16 NOTE — ED Notes (Signed)
 Family at bedside. Answered all questions. Pt is comfortable at this time.

## 2024-02-16 NOTE — Consult Note (Signed)
 [facility] CLINIC CARDIOLOGY CONSULT NOTE       Patient ID: Mark Carr MRN: [id number] DOB/AGE: 17-Oct-1956 67 y.o.  Admit date: 02/16/2024 Referring Physician Dr. Clide Dales Primary Physician Beryle Flock, Marzella Schlein, MD  Primary Cardiologist None Reason for Consultation AF RVR  HPI: Mark Carr is a 68 y.o. male  with a past medical history of alcohol abuse, cirrhosis who presented to the ED on 02/16/2024 for headache. Found to be in AF RVR. Cardiology was consulted for further evaluation.   Patient reports that he had a headache today and it was worse than he had experienced before so he decided to come to the ED for further evaluation. Workup in the ED notable for Cr 0.56, K 3.6, Na 127, AST 157, ALT 50, Hgb 10.2, WBC 6.8, ammonia 41. Troponin normal x1. EKG with atrial flutter rate 147. MRI brain with Innumerable chronic micro hemorrhages involving both cerebral hemispheres, most consistent with cerebral amyloid angiopathy. Started on IV diltiazem for rate control, had drop in BP afterwards.   At the time of my evaluation patient is resting in ED stretcher with daughters present at bedside. He denies any chest pain, SOB, palpitation symptoms. Overall feeling well. We discussed his history in further detail. He reports that he has been diagnosed with cirrhosis, GI wanted to do EGD but would not proceed until patient had stopped drinking. This has not been done yet. Denies any bleeding issues.   Review of systems complete and found to be negative unless listed above    Past Medical History:  Diagnosis Date        Acute encephalopathy 06/16/2021   Cirrhosis of liver (HCC)    Coag negative Staphylococcus bacteremia 04/21/2021   Wrist fracture     Past Surgical History:  Procedure Laterality Date   FRACTURE SURGERY     wrist   WRIST FRACTURE SURGERY Right 2016    (Not in a hospital admission)  Social History   Socioeconomic History   Marital status: Married    Spouse name: Not on  file   Number of children: 4   Years of education: 11   Highest education level: 11th grade  Occupational History   Not on file  Tobacco Use   Smoking status: Every Day    Current packs/day: 0.50    Average packs/day: 0.5 packs/day for 40.0 years (20.0 ttl pk-yrs)    Types: Cigarettes   Smokeless tobacco: Never  Vaping Use   Vaping status: Never Used  Substance and Sexual Activity   Alcohol use: Yes    Alcohol/week: 4.0 standard drinks of alcohol    Types: 4 Cans of beer per week    Comment: daughter says he is heavy daily drinker and that pt minimizes ETOH intake   Drug use: Not Currently   Sexual activity: Not Currently  Other Topics Concern   Not on file  Social History Narrative   Not on file   Social Drivers of Health   Financial Resource Strain: Low Risk  (08/10/2023)   Overall Financial Resource Strain (CARDIA)    Difficulty of Paying Living Expenses: Not hard at all  Food Insecurity: No Food Insecurity (08/10/2023)   Hunger Vital Sign    Worried About Running Out of Food in the Last Year: Never true    Ran Out of Food in the Last Year: Never true  Transportation Needs: No Transportation Needs (08/10/2023)   PRAPARE - Administrator, Civil Service (Medical): No  Lack of Transportation (Non-Medical): No  Physical Activity: Insufficiently Active (08/10/2023)   Exercise Vital Sign    Days of Exercise per Week: 3 days    Minutes of Exercise per Session: 20 min  Stress: No Stress Concern Present (08/10/2023)   [employer] of Occupational Health - Occupational Stress Questionnaire    Feeling of Stress : Not at all  Social Connections: Socially Isolated (08/10/2023)   Social Connection and Isolation Panel [NHANES]    Frequency of Communication with Friends and Family: Once a week    Frequency of Social Gatherings with Friends and Family: Once a week    Attends Religious Services: Never    Database administrator or Organizations: No    Attends Tax inspector Meetings: Never    Marital Status: Married  Catering manager Violence: Not At Risk (08/10/2023)   Humiliation, Afraid, Rape, and Kick questionnaire    Fear of Current or Ex-Partner: No    Emotionally Abused: No    Physically Abused: No    Sexually Abused: No    Family History  Problem Relation Age of Onset   Hypertension Mother    Deep vein thrombosis Mother    Cancer Sister        unknown type   Heart disease Brother    Cancer Maternal Grandmother      Vitals:   02/16/24 1100 02/16/24 1101 02/16/24 1130 02/16/24 1145  BP: (!) 81/68 100/70 (!) 87/67 (!) 87/67  Pulse: (!) 151 (!) 153 (!) 153 (!) 152  Resp: (!) 29 19 (!) 22 (!) 24  Temp:      TempSrc:      SpO2: 98% 92% 91% 91%  Weight:      Height:        PHYSICAL EXAM General: Chronically ill appearing male, well nourished, in no acute distress. HEENT: Normocephalic and atraumatic. Neck: No JVD.  Lungs: Normal respiratory effort on nasal cannula. Clear bilaterally to auscultation. No wheezes, crackles, rhonchi.  Heart: HRRR, elevated rate. Normal S1 and S2 without gallops or murmurs.  Abdomen: Non-distended appearing.  Msk: Normal strength and tone for age. Extremities: Warm and well perfused. No clubbing, cyanosis. No edema.  Neuro: Alert and oriented X 3. Psych: Answers questions appropriately.   Labs: Basic Metabolic Panel: Recent Labs    02/16/24 0829  NA 127*  K 3.6  CL 97*  CO2 20*  GLUCOSE 98  BUN 6*  CREATININE 0.56*  CALCIUM 7.8*   Liver Function Tests: Recent Labs    02/16/24 0829  AST 157*  ALT 50*  ALKPHOS 88  BILITOT 8.3*  PROT 7.0  ALBUMIN 1.9*   Recent Labs    02/16/24 0829  LIPASE 35   CBC: Recent Labs    02/16/24 0829  WBC 6.8  NEUTROABS 5.3  HGB 10.2*  HCT 27.7*  MCV 109.1*  PLT 44*   Cardiac Enzymes: Recent Labs    02/16/24 0829  TROPONINIHS 5   BNP: No results for input(s): "BNP" in the last 72 hours. D-Dimer: No results for input(s): "DDIMER"  in the last 72 hours. Hemoglobin A1C: No results for input(s): "HGBA1C" in the last 72 hours. Fasting Lipid Panel: No results for input(s): "CHOL", "HDL", "LDLCALC", "TRIG", "CHOLHDL", "LDLDIRECT" in the last 72 hours. Thyroid Function Tests: No results for input(s): "TSH", "T4TOTAL", "T3FREE", "THYROIDAB" in the last 72 hours.  Invalid input(s): "FREET3" Anemia Panel: No results for input(s): "VITAMINB12", "FOLATE", "FERRITIN", "TIBC", "IRON", "RETICCTPCT" in the last 72 hours.  Radiology: No results found.  ECHO ordered  TELEMETRY reviewed by me 02/16/2024: atrial flutter rate 150s  EKG reviewed by me: atrial flutter rate 147 bpm  Data reviewed by me 02/16/2024: last 24h vitals tele labs imaging I/O ED provider note, admission H&P  Principal Problem:   Atrial fibrillation with RVR (HCC)    ASSESSMENT AND PLAN:  Mark Carr is a 68 y.o. male  with a past medical history of alcohol abuse, cirrhosis who presented to the ED on 02/16/2024 for headache. Found to be in AF RVR. Cardiology was consulted for further evaluation.   # Atrial flutter RVR # Alcoholism # Cirrhosis Patient with long hx of alcohol use and subsequent cirrhosis, reports he is still drinking. Came in for headache and found to be in atrial flutter with rates in the 150s. Started on IV dilt and had resultant drop in BP.  -Echo ordered. -Start amiodarone bolus & infusion for atrial flutter. Can wean off diltiazem if rate improves. Discussed minimal stroke risk with starting amio not on anticoagulation. Patient and family express understanding and acceptable of risk. 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 Due to this, his CHADs-VASC score of 1, and patient not inclined to start anticoagulation will defer at this time.  This patient's plan of care was discussed and created with Dr. Corky Sing and he is in  agreement.  Signed: Gale Journey, PA-C  02/16/2024, 12:13 PM [facility] Card[facility]

## 2024-02-16 NOTE — ED Notes (Signed)
 Pt reporting to ED d/t headache and alcohol intoxication. Pt with hx of cirrhosis of the liver and family has been working on getting patient setup with Palliative Care. Pt ABCs intact. RR even and unlabored on  @ 1 lpm. Pt in NAD and pts family said pt was able to eat a little more of his dinner tray than he normally dose at home. Bed in lowest locked position. Call bell in reach. Denies needs at this time.   Past Medical History:  Diagnosis Date        Acute encephalopathy 06/16/2021   Cirrhosis of liver (HCC)    Coag negative Staphylococcus bacteremia 04/21/2021   Wrist fracture

## 2024-02-16 NOTE — ED Provider Notes (Signed)
 [facility] Provider Note    Event Date/Time   First MD Initiated Contact with Patient 02/16/24 [id number]     (approximate)   History   Headache and Alcohol Intoxication   HPI Mark Carr is a 68 y.o. male with history of dementia, alcohol abuse disorder, alcoholic process presenting today for alcohol intoxication and headache.  EMS reportedly called for headache which has been going on for the past 2 to 3 days.  He was given Tylenol by EMS and currently denies a headache.  He does note alcohol use with approximately 12-18 beers per day still ongoing.  Otherwise currently denies fever, cough, congestion, chest pain, shortness of breath, abdominal pain, nausea, vomiting, diarrhea, constipation.   Spoke with family at bedside.  They note they are in discussion with palliative care outpatient but he is not currently on hospice.  They state he has gotten a lot weaker and now has difficulty walking around at home as well.  He has not been taking his lactulose as prescribed.  No history of atrial fibrillation.     Physical Exam   Triage Vital Signs: ED Triage Vitals  Encounter Vitals Group     BP      Systolic BP Percentile      Diastolic BP Percentile      Pulse      Resp      Temp      Temp src      SpO2      Weight      Height      Head Circumference      Peak Flow      Pain Score      Pain Loc      Pain Education      Exclude from Growth Chart     Most recent vital signs: Vitals:   02/16/24 0930 02/16/24 1000  BP: 119/75 102/68  Pulse: (!) 142 (!) 148  Resp: 14 20  Temp:    SpO2: (!) 89% 92%    Physical Exam: I have reviewed the vital signs and nursing notes. General: Awake, alert, no acute distress.  Nontoxic appearing. Head:  Atraumatic, normocephalic.   ENT:  EOM intact, PERRL. Oral mucosa is pink and moist with no lesions. Neck: Neck is supple with full range of motion, No meningeal signs. Cardiovascular:  tachycardia with irregular  rhythm, No murmurs. Peripheral pulses palpable and equal bilaterally. Respiratory:  Symmetrical chest wall expansion.  No rhonchi, rales, or wheezes.  Good air movement throughout.  No use of accessory muscles.   Musculoskeletal:  No cyanosis or edema. Moving extremities with full ROM Abdomen:  Soft, nontender, distended. Neuro:  GCS 15, moving all four extremities, interacting appropriately. Speech clear. Psych:  Calm, appropriate.   Skin:  Warm, dry, no rash.    ED Results / Procedures / Treatments   Labs (all labs ordered are listed, but only abnormal results are displayed) Labs Reviewed  ETHANOL - Abnormal; Notable for the following components:      Result Value   Alcohol, Ethyl (B) 106 (*)    All other components within normal limits  COMPREHENSIVE METABOLIC PANEL WITH GFR - Abnormal; Notable for the following components:   Sodium 127 (*)    Chloride 97 (*)    CO2 20 (*)    BUN 6 (*)    Creatinine, Ser 0.56 (*)    Calcium 7.8 (*)    Albumin 1.9 (*)    AST  157 (*)    ALT 50 (*)    Total Bilirubin 8.3 (*)    All other components within normal limits  CBC WITH DIFFERENTIAL/PLATELET - Abnormal; Notable for the following components:   RBC 2.54 (*)    Hemoglobin 10.2 (*)    HCT 27.7 (*)    MCV 109.1 (*)    MCH 40.2 (*)    MCHC 36.8 (*)    RDW 19.1 (*)    Platelets 44 (*)    All other components within normal limits  RESP PANEL BY RT-PCR (RSV, FLU A&B, COVID)  RVPGX2  LIPASE, BLOOD  AMMONIA  TROPONIN I (HIGH SENSITIVITY)     EKG My EKG interpretation: Rate of 147, A-fib with RVR.  Slight QT prolongation of 518.  No other acute ST elevations or depressions   RADIOLOGY    PROCEDURES:  Critical Care performed: Yes, see critical care procedure note(s)  .Critical Care  Performed by: Janith Lima, MD Authorized by: Janith Lima, MD   Critical care provider statement:    Critical care time (minutes):  30   Critical care was necessary to treat or prevent  imminent or life-threatening deterioration of the following conditions: Atrial flutter w/ RVR on diltiazem drip.   Critical care was time spent personally by me on the following activities:  Development of treatment plan with patient or surrogate, discussions with consultants, evaluation of patient's response to treatment, examination of patient, ordering and review of laboratory studies, ordering and review of radiographic studies, ordering and performing treatments and interventions, pulse oximetry, re-evaluation of patient's condition and review of old charts   I assumed direction of critical care for this patient from another provider in my specialty: no     Care discussed with: admitting provider      MEDICATIONS ORDERED IN ED: Medications  diltiazem (CARDIZEM) 125 mg in dextrose 5% 125 mL (1 mg/mL) infusion (has no administration in time range)  lactated ringers bolus 1,000 mL (0 mLs Intravenous Stopped 02/16/24 0945)  diltiazem (CARDIZEM) injection 15 mg (15 mg Intravenous Given 02/16/24 0903)  diltiazem (CARDIZEM) injection 15 mg (15 mg Intravenous Given 02/16/24 0939)     IMPRESSION / MDM / ASSESSMENT AND PLAN / ED COURSE  I reviewed the triage vital signs and the nursing notes.                              Differential diagnosis includes, but is not limited to, alcohol intoxication, dehydration, electrolyte abnormality, A-fib with RVR  Patient's presentation is most consistent with acute presentation with potential threat to life or bodily function.  Patient is a 68 year old male presenting today for headache and alcohol intoxication.  Patient's headache is no longer present on arrival after receiving Tylenol with EMS.  He is in A-fib with RVR and family deny any history of the same.  Initially we will start out with 1 L of fluids and obtain laboratory workup.  Given that he is on palliative care but not hospice, discussed with family how extensive they would like his treatment at this  time.  He is not currently on hospice at the moment so they would like full medical care at this time.  EKG possibly either A-fib versus atrial flutter with RVR.  No significant change with 1.8 L in total between myself and EMS.  Laboratory workup notable for hyponatremia which I suspect is secondary to his chronic alcohol abuse with elevated  alcohol level here today.  No signs of withdrawal symptoms at this time.  CBC otherwise consistent with his baseline anemia and thrombocytopenia.  Troponin negative.  Negative for COVID, flu, RSV.  Patient was given diltiazem bolus 15 mg x 2 without significant change in his heart rate.  Will place on diltiazem drip and admit to hospitalist for further care of atrial flutter and RVR.  Will likely need echo given unknown duration of how long he has been in this rate and not on blood thinner.  The patient is on the cardiac monitor to evaluate for evidence of arrhythmia and/or significant heart rate changes. Clinical Course as of 02/16/24 1018  Wed Feb 16, 2024  0848 Family states they would prefer patient to go home if he is medically stable as that would be his wishes.  Otherwise, if he requires admission that would be fine with them. [DW]  1610 Has-bled score of 3 which puts him at high risk for major bleeding. [DW]  0908 CBC with Differential(!) Largely consistent with baseline anemia and thrombocytopenia seen in the past likely secondary to alcoholic cirrhosis [DW]  0908 Comprehensive metabolic panel(!) Liver labs likely comparable to his baseline.  Hyponatremia likely secondary to chronic alcohol use [DW]    Clinical Course User Index [DW] Janith Lima, MD     FINAL CLINICAL IMPRESSION(S) / ED DIAGNOSES   Final diagnoses:  Hyponatremia  Atrial fibrillation with RVR (HCC)  Alcoholic intoxication without complication (HCC)  Atrial flutter, unspecified type (HCC)     Rx / DC Orders   ED Discharge Orders     None        Note:  This document  was prepared using Dragon voice recognition software and may include unintentional dictation errors.   Janith Lima, MD 02/16/24 1021

## 2024-02-16 NOTE — H&P (Signed)
 History and Physical    Patient: Mark Carr NFA:[id number] DOB: November 23, 1955 DOA: 02/16/2024 DOS: the patient was seen and examined on 02/16/2024 PCP: Erasmo Downer, MD  Patient coming from: Home  Chief Complaint:  Chief Complaint  Patient presents with   Headache   Alcohol Intoxication   HPI: Mark Carr is a 68 y.o. male with medical history significant of alcohol abuse, liver cirrhosis currently under palliative care as outpatient, history of hepatic encephalopathy, noncompliant with medications, currently drinks around 12-18 beers every day presented to the emergency department for evaluation of alcohol intoxication, headache.  Has lower extremity weakness, difficulty walking.  Patient denies any complaints of chest pain, shortness of breath or palpitations.  No abdominal pain, nausea or vomiting.  His appetite is poor.  No fever, chills or rigors. Upon EMS arrival, patient got Tylenol which improved his headache.  Patient was noted to have rapid A-fib and is brought to the emergency department for further management evaluation. In the emergency department, patient was noted to have sodium of 127, albumin 1.9, AST 5.7, ALT 50, hemoglobin 10.2.  Heart rate around 150, got IV hydration with no help, started on diltiazem drip, TRH service consulted for admission. During my examination, patient's heart rate consistent above 155, RN noted drop in blood pressure with diltiazem drip.  He denies any complaints.  Cardiology consulted.  Review of Systems: As mentioned in the history of present illness. All other systems reviewed and are negative. Past Medical History:  Diagnosis Date        Acute encephalopathy 06/16/2021   Cirrhosis of liver (HCC)    Coag negative Staphylococcus bacteremia 04/21/2021   Wrist fracture    Past Surgical History:  Procedure Laterality Date   FRACTURE SURGERY     wrist   WRIST FRACTURE SURGERY Right 2016   Social History:  reports that he has been  smoking cigarettes. He has a 20 pack-year smoking history. He has never used smokeless tobacco. He reports current alcohol use of about 4.0 standard drinks of alcohol per week. He reports that he does not currently use drugs.  No Known Allergies  Family History  Problem Relation Age of Onset   Hypertension Mother    Deep vein thrombosis Mother    Cancer Sister        unknown type   Heart disease Brother    Cancer Maternal Grandmother     Prior to Admission medications   Medication Sig Start Date End Date Taking? Authorizing Provider  gabapentin (NEURONTIN) 100 MG capsule TAKE 1 CAPSULE BY MOUTH 2 TIMES A DAY AND TAKE THREE CAPSULES BY MOUTH EVERY NIGHT AT BEDTIME AS NEEDED FOR PAIN 11/08/23  Yes Malva Limes, MD  QUEtiapine (SEROQUEL) 25 MG tablet Take 25 mg by mouth at bedtime. 02/15/24  Yes [provider]  cephALEXin (KEFLEX) 500 MG capsule Take 500 mg by mouth 3 (three) times daily. Patient not taking: Reported on 02/16/2024 11/11/23   [provider]  lactulose (CHRONULAC) 10 GM/15ML solution Take 45 mLs (30 g total) by mouth 2 (two) times daily. Patient not taking: Reported on 02/16/2024 03/25/23   Wyline Mood, MD  Multiple Vitamin (MULTIVITAMIN WITH MINERALS) TABS tablet Take 1 tablet by mouth daily. Patient not taking: Reported on 07/26/2023 06/15/23   Arnetha Courser, MD  naltrexone (DEPADE) 50 MG tablet Take 1 tablet (50 mg total) by mouth daily. Patient not taking: Reported on 08/10/2023 07/26/23   Malva Limes, MD  thiamine (VITAMIN  B-1) 100 MG tablet Take 1 tablet (100 mg total) by mouth daily. Patient not taking: Reported on 02/16/2024 06/15/23   Arnetha Courser, MD    Physical Exam: Vitals:   02/16/24 1254 02/16/24 1300 02/16/24 1330 02/16/24 1400  BP:  105/73 95/63 (!) 94/59  Pulse:  (!) 141 100 100  Resp:  (!) 30 (!) 22 20  Temp: 98.7 F (37.1 C)     TempSrc: Oral     SpO2:  93% 94% 93%  Weight:      Height:       General - Elderly thin built Caucasian  male, no apparent distress HEENT - PERRLA, EOMI, atraumatic head, non tender sinuses. Lung - Clear, rales, rhonchi, wheezes. Heart - S1, S2 heard, irregular, tachycardia, no pedal edema. Abdomen-soft, nontender, distended, bowel sounds low. Neuro - Alert, awake and oriented x 3, non focal exam. Skin - Warm and dry. Data Reviewed:     Latest Ref Rng & Units 02/16/2024    8:29 AM 06/14/2023    5:19 AM 06/13/2023   12:17 PM  CBC  WBC 4.0 - 10.5 K/uL 6.8  1.9  3.0   Hemoglobin 13.0 - 17.0 g/dL 10.2  72.5  36.6   Hematocrit 39.0 - 52.0 % 27.7  33.4  40.1   Platelets 150 - 400 K/uL 44  56  86       Latest Ref Rng & Units 02/16/2024    8:29 AM 06/14/2023    5:19 AM 06/13/2023   12:17 PM  CMP  Glucose 70 - 99 mg/dL 98  76  91   BUN 8 - 23 mg/dL 6  5  6    Creatinine 0.61 - 1.24 mg/dL 4.40  3.47  4.25   Sodium 135 - 145 mmol/L 127  137  139   Potassium 3.5 - 5.1 mmol/L 3.6  3.5  4.2   Chloride 98 - 111 mmol/L 97  103  101   CO2 22 - 32 mmol/L 20  20  22    Calcium 8.9 - 10.3 mg/dL 7.8  7.9  8.6   Total Protein 6.5 - 8.1 g/dL 7.0  5.7  8.1   Total Bilirubin 0.0 - 1.2 mg/dL 8.3  1.6  2.8   Alkaline Phos 38 - 126 U/L 88  99  124   AST 15 - 41 U/L 157  81  108   ALT 0 - 44 U/L 50  29  39    No results found.   Assessment and Plan: Mark Carr is a 68 y.o. male with medical history significant of alcohol abuse, liver cirrhosis currently under palliative care as outpatient, history of hepatic encephalopathy, noncompliant with medications, currently drinks around 12-18 beers every day presented to the emergency department for evaluation of alcohol intoxication, headache.  Patient was found to have rapid A-fib, started on diltiazem drip with no improvement.  Cardiology consulted who started him on amiodarone drip.  Plan: Atrial fibrillation with rapid ventricular response Patient will be admitted to progressive care unit under telemetry service. Stop Cardizem drip. Patient is started on  amiodarone loading dose with continuous drip per  protocol. Low chadvasc score, he is at high risk for bleeding. No anticoagulation at this time. Continue to monitor vitals, saturations closely. Monitor on telemetry.  Alcohol intoxication: High risk for withdrawal. Continue CIWA protocol. Continue thiamine, folate and multivitamin.  Alcohol abuse Alcoholic liver cirrhosis: Patient is not compliant with his medications. Mental status at baseline, ammonia 41. Abnormal LFT,  low albumin.  Did not follow-up with GI recently. Advised to quit alcohol.  Patient is on palliative program as outpatient.  TOC to contact outpatient palliative liaison.  Hyponatremia- Beer potomania Continue to trend sodium. Encourage oral fluids, supplements.  Thrombocytopenia- Due to alcohol use. No active bleeding. Monitor daily platelet count, avoid heparin for VTE.  Chronic anemia: Macrocytosis noted. Will check anemia workup, iron profile, B12 and folate. Hemoglobin stable.  Generalized weakness: Does complain of bilateral lower extremity weakness. PT OT evaluation for discharge needs.  SCDs Start: 02/16/24 1036 for VTE prophylaxis as he is at hight risk for bleeding. Fall, aspiration and seizure precautions. Nursing supportive care.   Advance Care Planning:   Code Status: Limited: Do not attempt resuscitation (DNR) -DNR-LIMITED -Do Not Intubate/DNI discussed with patient and daughters at bedside.  Consults: Cardiology, palliative  Family Communication: Current care plan discussed with both daughters at bedside.  They understand and agree.  Severity of Illness: The appropriate patient status for this patient is INPATIENT. Inpatient status is judged to be reasonable and necessary in order to provide the required intensity of service to ensure the patient's safety. The patient's presenting symptoms, physical exam findings, and initial radiographic and laboratory data in the context of their  chronic comorbidities is felt to place them at high risk for further clinical deterioration. Furthermore, it is not anticipated that the patient will be medically stable for discharge from the hospital within 2 midnights of admission.   * I certify that at the point of admission it is my clinical judgment that the patient will require inpatient hospital care spanning beyond 2 midnights from the point of admission due to high intensity of service, high risk for further deterioration and high frequency of surveillance required.*  Author: Marcelino Duster, MD 02/16/2024 3:07 PM  For on call review [email or web address].

## 2024-02-17 ENCOUNTER — Inpatient Hospital Stay: Admit: 2024-02-17 | Discharge: 2024-02-17 | Disposition: A | Discharge status: Home / Self Care | Attending: Student

## 2024-02-17 ENCOUNTER — Encounter: Payer: Self-pay | Admitting: Internal Medicine

## 2024-02-17 DIAGNOSIS — F10920 Alcohol use, unspecified with intoxication, uncomplicated: Secondary | ICD-10-CM | POA: Diagnosis not present

## 2024-02-17 DIAGNOSIS — I4891 Unspecified atrial fibrillation: Secondary | ICD-10-CM | POA: Diagnosis not present

## 2024-02-17 DIAGNOSIS — Z515 Encounter for palliative care: Secondary | ICD-10-CM | POA: Diagnosis not present

## 2024-02-17 DIAGNOSIS — D649 Anemia, unspecified: Secondary | ICD-10-CM | POA: Diagnosis not present

## 2024-02-17 DIAGNOSIS — E871 Hypo-osmolality and hyponatremia: Secondary | ICD-10-CM | POA: Diagnosis not present

## 2024-02-17 DIAGNOSIS — R413 Other amnesia: Secondary | ICD-10-CM

## 2024-02-17 DIAGNOSIS — K7030 Alcoholic cirrhosis of liver without ascites: Secondary | ICD-10-CM | POA: Diagnosis not present

## 2024-02-17 DIAGNOSIS — R296 Repeated falls: Secondary | ICD-10-CM | POA: Diagnosis not present

## 2024-02-17 LAB — COMPREHENSIVE METABOLIC PANEL WITH GFR
ALT: 41 U/L (ref 0–44)
ALT: 46 U/L — ABNORMAL HIGH (ref 0–44)
AST: 137 U/L — ABNORMAL HIGH (ref 15–41)
AST: 139 U/L — ABNORMAL HIGH (ref 15–41)
Albumin: 1.6 g/dL — ABNORMAL LOW (ref 3.5–5.0)
Albumin: 1.6 g/dL — ABNORMAL LOW (ref 3.5–5.0)
Alkaline Phosphatase: 76 U/L (ref 38–126)
Alkaline Phosphatase: 74 U/L (ref 38–126)
Anion gap: 6 (ref 5–15)
Anion gap: 9 (ref 5–15)
BUN: 9 mg/dL (ref 8–23)
BUN: 8 mg/dL (ref 8–23)
CO2: 24 mmol/L (ref 22–32)
CO2: 23 mmol/L (ref 22–32)
Calcium: 7.8 mg/dL — ABNORMAL LOW (ref 8.9–10.3)
Calcium: 7.7 mg/dL — ABNORMAL LOW (ref 8.9–10.3)
Chloride: 98 mmol/L (ref 98–111)
Chloride: 94 mmol/L — ABNORMAL LOW (ref 98–111)
Creatinine, Ser: 0.64 mg/dL (ref 0.61–1.24)
Creatinine, Ser: 0.64 mg/dL (ref 0.61–1.24)
GFR, Estimated: >60 mL/min (ref >60)
GFR, Estimated: >60 mL/min (ref >60)
Glucose, Bld: 154 mg/dL — ABNORMAL HIGH (ref 70–99)
Glucose, Bld: 243 mg/dL — ABNORMAL HIGH (ref 70–99)
Potassium: 3.3 mmol/L — ABNORMAL LOW (ref 3.5–5.1)
Potassium: 3.6 mmol/L (ref 3.5–5.1)
Sodium: 128 mmol/L — ABNORMAL LOW (ref 135–145)
Sodium: 126 mmol/L — ABNORMAL LOW (ref 135–145)
Total Bilirubin: 8.2 mg/dL — ABNORMAL HIGH (ref 0.0–1.2)
Total Bilirubin: 8.6 mg/dL — ABNORMAL HIGH (ref 0.0–1.2)
Total Protein: 6.1 g/dL — ABNORMAL LOW (ref 6.5–8.1)
Total Protein: 6.2 g/dL — ABNORMAL LOW (ref 6.5–8.1)

## 2024-02-17 LAB — ECHOCARDIOGRAM COMPLETE
AR max vel: 3.34 cm2
AV Area VTI: 3.23 cm2
AV Area mean vel: 2.74 cm2
AV Mean grad: 2 mmHg
AV Peak grad: 3.9 mmHg
Ao pk vel: 0.99 m/s
Area-P 1/2: 4.83 cm2
Height: 68 in
MV VTI: 3.41 cm2
S' Lateral: 3.1 cm
Single Plane A4C EF: 56.6 %
Weight: 2225.6 [oz_av]

## 2024-02-17 LAB — VITAMIN B12: Vitamin B-12: 1294 pg/mL — ABNORMAL HIGH (ref 180–914)

## 2024-02-17 LAB — URINALYSIS, COMPLETE (UACMP) WITH MICROSCOPIC
Bilirubin Urine: NEGATIVE
Glucose, UA: NEGATIVE mg/dL
Ketones, ur: NEGATIVE mg/dL
Leukocytes,Ua: NEGATIVE
Nitrite: NEGATIVE
Protein, ur: NEGATIVE mg/dL
Specific Gravity, Urine: 1.005 (ref 1.005–1.030)
pH: 6 (ref 5.0–8.0)

## 2024-02-17 LAB — CBC
HCT: 24.8 % — ABNORMAL LOW (ref 39.0–52.0)
Hemoglobin: 9.1 g/dL — ABNORMAL LOW (ref 13.0–17.0)
MCH: 39.6 pg — ABNORMAL HIGH (ref 26.0–34.0)
MCHC: 36.7 g/dL — ABNORMAL HIGH (ref 30.0–36.0)
MCV: 107.8 fL — ABNORMAL HIGH (ref 80.0–100.0)
Platelets: 33 10*3/uL — ABNORMAL LOW (ref 150–400)
RBC: 2.3 MIL/uL — ABNORMAL LOW (ref 4.22–5.81)
RDW: 18.9 % — ABNORMAL HIGH (ref 11.5–15.5)
WBC: 5.9 10*3/uL (ref 4.0–10.5)
nRBC: 0 % (ref 0.0–0.2)

## 2024-02-17 LAB — IRON AND TIBC: Iron: 103 ug/dL (ref 45–182)

## 2024-02-17 LAB — BILIRUBIN, FRACTIONATED(TOT/DIR/INDIR)
Bilirubin, Direct: 3.1 mg/dL — ABNORMAL HIGH (ref 0.0–0.2)
Indirect Bilirubin: 4.5 mg/dL — ABNORMAL HIGH (ref 0.3–0.9)
Total Bilirubin: 7.6 mg/dL — ABNORMAL HIGH (ref 0.0–1.2)

## 2024-02-17 LAB — PROTIME-INR
INR: 2.2 — ABNORMAL HIGH (ref 0.8–1.2)
Prothrombin Time: 24.4 s — ABNORMAL HIGH (ref 11.4–15.2)

## 2024-02-17 LAB — FERRITIN: Ferritin: 1651 ng/mL — ABNORMAL HIGH (ref 24–336)

## 2024-02-17 LAB — MAGNESIUM: Magnesium: 1.5 mg/dL — ABNORMAL LOW (ref 1.7–2.4)

## 2024-02-17 LAB — FOLATE: Folate: 13.3 ng/mL (ref >5.9)

## 2024-02-17 MED ORDER — POTASSIUM CHLORIDE 20 MEQ PO PACK
40.0000 meq | PACK | Freq: Once | ORAL | Status: AC
  Administered 2024-02-17: 40 meq via ORAL
  Filled 2024-02-17: qty 2

## 2024-02-17 MED ORDER — METOPROLOL TARTRATE 25 MG PO TABS
25.0000 mg | ORAL_TABLET | Freq: Two times a day (BID) | ORAL | Status: DC
  Administered 2024-02-17 – 2024-02-18 (×3): 25 mg via ORAL
  Filled 2024-02-17 (×3): qty 1

## 2024-02-17 MED ORDER — ALPRAZOLAM 0.5 MG PO TABS
0.5000 mg | ORAL_TABLET | Freq: Three times a day (TID) | ORAL | Status: DC | PRN
  Administered 2024-02-17 (×2): 0.5 mg via ORAL
  Filled 2024-02-17 (×2): qty 1

## 2024-02-17 MED ORDER — MAGNESIUM SULFATE IN D5W 1-5 GM/100ML-% IV SOLN
1.0000 g | Freq: Once | INTRAVENOUS | Status: AC
  Administered 2024-02-17: 1 g via INTRAVENOUS
  Filled 2024-02-17: qty 100

## 2024-02-17 MED ORDER — MAGNESIUM SULFATE 50 % IJ SOLN: 1.0000 g | Freq: Once | INTRAMUSCULAR | Status: DC

## 2024-02-17 NOTE — ED Notes (Signed)
 Pt continues to try to make attempts to get OOB. Pt's son and daughter remain at bedside and redirect him. Pt continues to require redirection to stay in bed. GCS remains 15. When asked CIWA questions pt denies feeling nervous or anxious. Pt will lay in bed for approx after each redirection before attempting to move his legs off the bed.

## 2024-02-17 NOTE — Progress Notes (Signed)
 [facility] CLINIC CARDIOLOGY PROGRESS NOTE       Patient ID: MAXXON SCHWANKE MRN: [id number] DOB/AGE: [birth year] 68 y.o.  Admit date: 02/16/2024 Referring Physician Dr. Clide Dales Primary Physician Beryle Flock, Marzella Schlein, MD  Primary Cardiologist None Reason for Consultation AF RVR  HPI: SANDERS MANNINEN is a 68 y.o. male  with a past medical history of alcohol abuse, cirrhosis who presented to the ED on 02/16/2024 for headache. Found to be in AF RVR. Cardiology was consulted for further evaluation.   Interval History: -Patient seen and examined this AM, resting comfortably in hospital bed.  -Converted to NSR from atrial flutter. Rates in the 90s. No complaints of CP, SOB, palpitations.  -BP remains stable.   Review of systems complete and found to be negative unless listed above    Past Medical History:  Diagnosis Date        Acute encephalopathy 06/16/2021   Cirrhosis of liver (HCC)    Coag negative Staphylococcus bacteremia 04/21/2021   Wrist fracture     Past Surgical History:  Procedure Laterality Date   FRACTURE SURGERY     wrist   WRIST FRACTURE SURGERY Right 2016    (Not in a hospital admission)  Social History   Socioeconomic History   Marital status: Married    Spouse name: Not on file   Number of children: 4   Years of education: 11   Highest education level: 11th grade  Occupational History   Not on file  Tobacco Use   Smoking status: Every Day    Current packs/day: 0.50    Average packs/day: 0.5 packs/day for 40.0 years (20.0 ttl pk-yrs)    Types: Cigarettes   Smokeless tobacco: Never  Vaping Use   Vaping status: Never Used  Substance and Sexual Activity   Alcohol use: Yes    Alcohol/week: 4.0 standard drinks of alcohol    Types: 4 Cans of beer per week    Comment: daughter says he is heavy daily drinker and that pt minimizes ETOH intake   Drug use: Not Currently   Sexual activity: Not Currently  Other Topics Concern   Not on file  Social History  Narrative   Not on file   Social Drivers of Health   Financial Resource Strain: Low Risk  (08/10/2023)   Overall Financial Resource Strain (CARDIA)    Difficulty of Paying Living Expenses: Not hard at all  Food Insecurity: No Food Insecurity (08/10/2023)   Hunger Vital Sign    Worried About Running Out of Food in the Last Year: Never true    Ran Out of Food in the Last Year: Never true  Transportation Needs: No Transportation Needs (08/10/2023)   PRAPARE - Administrator, Civil Service (Medical): No    Lack of Transportation (Non-Medical): No  Physical Activity: Insufficiently Active (08/10/2023)   Exercise Vital Sign    Days of Exercise per Week: 3 days    Minutes of Exercise per Session: 20 min  Stress: No Stress Concern Present (08/10/2023)   [employer] of Occupational Health - Occupational Stress Questionnaire    Feeling of Stress : Not at all  Social Connections: Socially Isolated (08/10/2023)   Social Connection and Isolation Panel [NHANES]    Frequency of Communication with Friends and Family: Once a week    Frequency of Social Gatherings with Friends and Family: Once a week    Attends Religious Services: Never    Database administrator or Organizations: No  Attends Banker Meetings: Never    Marital Status: Married  Catering manager Violence: Not At Risk (08/10/2023)   Humiliation, Afraid, Rape, and Kick questionnaire    Fear of Current or Ex-Partner: No    Emotionally Abused: No    Physically Abused: No    Sexually Abused: No    Family History  Problem Relation Age of Onset   Hypertension Mother    Deep vein thrombosis Mother    Cancer Sister        unknown type   Heart disease Brother    Cancer Maternal Grandmother      Vitals:   02/17/24 1115 02/17/24 1131 02/17/24 1132 02/17/24 1143  BP:  117/64  111/72  Pulse: 95 95  93  Resp: (!) 21   19  Temp:   98.4 F (36.9 C) 98.1 F (36.7 C)  TempSrc:   Oral Oral  SpO2: 94%   96%   Weight:      Height:        PHYSICAL EXAM General: Chronically ill appearing male, well nourished, in no acute distress. HEENT: Normocephalic and atraumatic. Neck: No JVD.  Lungs: Normal respiratory effort on nasal cannula. Clear bilaterally to auscultation. No wheezes, crackles, rhonchi.  Heart: HRRR. Normal S1 and S2 without gallops or murmurs.  Abdomen: Non-distended appearing.  Msk: Normal strength and tone for age. Extremities: Warm and well perfused. No clubbing, cyanosis. No edema.  Neuro: Alert and oriented X 3. Psych: Answers questions appropriately.   Labs: Basic Metabolic Panel: Recent Labs    02/16/24 0829 02/17/24 0601  NA 127* 128*  K 3.6 3.3*  CL 97* 98  CO2 20* 24  GLUCOSE 98 154*  BUN 6* 9  CREATININE 0.56* 0.64  CALCIUM 7.8* 7.8*  MG  --  1.5*   Liver Function Tests: Recent Labs    02/16/24 0829 02/17/24 0601  AST 157* 137*  ALT 50* 41  ALKPHOS 88 76  BILITOT 8.3* 7.6*  8.2*  PROT 7.0 6.1*  ALBUMIN 1.9* 1.6*   Recent Labs    02/16/24 0829  LIPASE 35   CBC: Recent Labs    02/16/24 0829 02/17/24 0601  WBC 6.8 5.9  NEUTROABS 5.3  --   HGB 10.2* 9.1*  HCT 27.7* 24.8*  MCV 109.1* 107.8*  PLT 44* 33*   Cardiac Enzymes: Recent Labs    02/16/24 0829  TROPONINIHS 5   BNP: No results for input(s): "BNP" in the last 72 hours. D-Dimer: No results for input(s): "DDIMER" in the last 72 hours. Hemoglobin A1C: No results for input(s): "HGBA1C" in the last 72 hours. Fasting Lipid Panel: No results for input(s): "CHOL", "HDL", "LDLCALC", "TRIG", "CHOLHDL", "LDLDIRECT" in the last 72 hours. Thyroid Function Tests: No results for input(s): "TSH", "T4TOTAL", "T3FREE", "THYROIDAB" in the last 72 hours.  Invalid input(s): "FREET3" Anemia Panel: Recent Labs    02/17/24 0601 02/17/24 0905  VITAMINB12  --  1,294*  FOLATE 13.3  --   FERRITIN 1,651*  --   TIBC NOT CALCULATED  --   IRON 103  --      Radiology: No results  found.  ECHO pending  TELEMETRY reviewed by me 02/17/2024: sinus rhythm rate 90s  EKG reviewed by me: atrial flutter rate 147 bpm  Data reviewed by me 02/17/2024: last 24h vitals tele labs imaging I/O hospitalist progress note  Principal Problem:   Atrial fibrillation with RVR (HCC) Active Problems:   Tobacco use disorder   Generalized weakness  Protein-calorie malnutrition, severe   Alcoholic cirrhosis of liver without ascites (HCC)   Thrombocytopenia (HCC)   Frequent falls   Memory loss or impairment   Chronic anemia    ASSESSMENT AND PLAN:  GREEN QUINCY is a 69 y.o. male  with a past medical history of alcohol abuse, cirrhosis who presented to the ED on 02/16/2024 for headache. Found to be in AF RVR. Cardiology was consulted for further evaluation.   # Atrial flutter RVR # Alcoholism # Cirrhosis Patient with long hx of alcohol use and subsequent cirrhosis, reports he is still drinking. Came in for headache and found to be in atrial flutter with rates in the 150s. Started on IV dilt and had resultant drop in BP.  -Echo pending. -Start metoprolol tartrate 25 mg twice daily. Continue amio gtt, likely plan to DC tomorrow.  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 Due to this, his CHADs-VASC score of 1, and patient not inclined to start anticoagulation will defer at this time.  This patient's plan of care was discussed and created with Dr. Corky Sing and he is in agreement.  Signed: Gale Journey, PA-C  02/17/2024, 12:36 PM [facility] Cardiology

## 2024-02-17 NOTE — ED Notes (Signed)
 Pt soiled adult brief. Pt cleaned and changed with RN and ED Tech. Pt repositioned for comfort. Pt ABCs intact. RR even and unlabored on [place] @ 2lpm. Pt in NAD. Bed in lowest locked position.

## 2024-02-17 NOTE — Progress Notes (Signed)
 Progress Note   Patient: Mark Carr ZOX:[id number] DOB: 09-21-1956 DOA: 02/16/2024     1 DOS: the patient was seen and examined on 02/17/2024   Brief hospital course: Mark Carr is a 68 y.o. male with medical history significant of alcohol abuse, liver cirrhosis currently under palliative care as outpatient, history of hepatic encephalopathy, noncompliant with medications, currently drinks around 12-18 beers every day presented to the emergency department for evaluation of alcohol intoxication, headache. Patient was found to have rapid A-fib, started on diltiazem drip with no improvement. Cardiology consulted who started him on amiodarone drip.   Assessment and Plan: Atrial fibrillation with rapid ventricular response Patient:.  Cardizem drip.  Cardiology advised amiodarone rate is well-controlled.  Today patient will start on metoprolol, will gradually taper off amiodarone. Low chadvasc score, he is at high risk for bleeding. No anticoagulation at this time. Continue to monitor vitals, saturations closely. Monitor on telemetry.   Alcohol intoxication: High risk for withdrawal. Continue CIWA protocol. Continue thiamine, folate and multivitamin. Patient does not wish to stop drinking alcohol.   Alcohol abuse Alcoholic liver cirrhosis: Patient is not compliant with his medications. Mental status at baseline, ammonia 41. Abnormal LFT, elevated bili, low albumin.  Did not follow-up with GI recently. MELD score 25, poor prognosis explained to patient's daughter. Patient is on palliative program as outpatient.  He wishes to go back home with palliative care as outpatient.  Hyponatremia- Beer potomania Continue to trend sodium.   Thrombocytopenia- Due to alcohol use. Has small rectal bleeding from varices. Monitor daily platelet count, avoid heparin for VTE.   Chronic anemia: Macrocytosis noted. Ferritin high. Hemoglobin stable.   Generalized weakness: Does complain of  bilateral lower extremity weakness. He wishes to return home with palliative care. Daughter agreed.      Out of bed to chair. Incentive spirometry. Nursing supportive care. Fall, aspiration precautions. Diet:  Diet Orders (From admission, onward)     Start     Ordered   02/16/24 1039  Diet Heart Room service appropriate? Yes; Fluid consistency: Thin  Diet effective now       Question Answer Comment  Room service appropriate? Yes   Fluid consistency: Thin      02/16/24 1041           DVT prophylaxis: SCDs Start: 02/16/24 1036  Level of care: Progressive   Code Status: Limited: Do not attempt resuscitation (DNR) -DNR-LIMITED -Do Not Intubate/DNI   Subjective: Patient is seen and examined today morning. He is sleeping, feels weak, eating poor. Did not get out of bed. Patient daughter understand poor prognosis.  Physical Exam: Vitals:   02/17/24 1115 02/17/24 1131 02/17/24 1132 02/17/24 1143  BP:  117/64  111/72  Pulse: 95 95  93  Resp: (!) 21   19  Temp:   98.4 F (36.9 C) 98.1 F (36.7 C)  TempSrc:   Oral Oral  SpO2: 94%   96%  Weight:      Height:        General - Elderly thin built Caucasian male, no apparent distress HEENT - PERRLA, EOMI, atraumatic head, non tender sinuses. Lung - Clear, rales, rhonchi, wheezes. Heart - S1, S2 heard, irregular, tachycardia, no pedal edema. Abdomen-soft, nontender, distended, bowel sounds low. Neuro - Alert, awake and oriented x 3, non focal exam. Skin - Warm and dry.  Data Reviewed:      Latest Ref Rng & Units 02/17/2024    6:01 AM 02/16/2024  8:29 AM 06/14/2023    5:19 AM  CBC  WBC 4.0 - 10.5 K/uL 5.9  6.8  1.9   Hemoglobin 13.0 - 17.0 g/dL 9.1  09.8  11.9   Hematocrit 39.0 - 52.0 % 24.8  27.7  33.4   Platelets 150 - 400 K/uL 33  44  56       Latest Ref Rng & Units 02/17/2024    1:42 PM 02/17/2024    6:01 AM 02/16/2024    8:29 AM  BMP  Glucose 70 - 99 mg/dL 147  829  98   BUN 8 - 23 mg/dL 8  9  6    Creatinine  0.61 - 1.24 mg/dL 5.62  1.30  8.65   Sodium 135 - 145 mmol/L 126  128  127   Potassium 3.5 - 5.1 mmol/L 3.6  3.3  3.6   Chloride 98 - 111 mmol/L 94  98  97   CO2 22 - 32 mmol/L 23  24  20    Calcium 8.9 - 10.3 mg/dL 7.7  7.8  7.8    ECHOCARDIOGRAM COMPLETE Result Date: 02/17/2024    ECHOCARDIOGRAM REPORT   Patient Name:   Mark Carr Date of Exam: 02/17/2024 Medical Rec #:  [id number]      Height:       68.0 in Accession #:    [id number]     Weight:       139.1 lb Date of Birth:  06-Oct-1956      BSA:          1.751 m Patient Age:    40 years       BP:           107/70 mmHg Patient Gender: M              HR:           95 bpm. Exam Location:  [facility] Procedure: 2D Echo, Cardiac Doppler, Color Doppler and Strain Analysis (Both            Spectral and Color Flow Doppler were utilized during procedure). Indications:     Atrial Fibrillation  History:         Patient has prior history of Echocardiogram examinations, most                  recent 04/21/2021. Arrythmias:Atrial Fibrillation,                  Signs/Symptoms:Bacteremia; Risk Factors:Hypertension and                  Current Smoker. ETOH abuse.  Sonographer:     Mikki Harbor Referring Phys:  [id number] CARALYN HUDSON Diagnosing Phys: Windell Norfolk  Sonographer Comments: Global longitudinal strain was attempted. IMPRESSIONS  1. Left ventricular ejection fraction, by estimation, is 55 to 60%. The left ventricle has normal function. The left ventricle has no regional wall motion abnormalities. There is mild left ventricular hypertrophy. Left ventricular diastolic parameters were normal.  2. Right ventricular systolic function is normal. The right ventricular size is mildly enlarged. There is normal pulmonary artery systolic pressure.  3. The mitral valve is normal in structure. Mild mitral valve regurgitation.  4. The aortic valve is tricuspid. There is mild thickening of the aortic valve. Aortic valve regurgitation is trivial.  5. Aortic dilatation noted.  There is moderate dilatation of the aortic root, measuring 45 mm. FINDINGS  Left Ventricle: Left ventricular ejection fraction, by estimation, is 55 to 60%.  The left ventricle has normal function. The left ventricle has no regional wall motion abnormalities. The left ventricular internal cavity size was normal in size. There is  mild left ventricular hypertrophy. Left ventricular diastolic parameters were normal. Right Ventricle: The right ventricular size is mildly enlarged. No increase in right ventricular wall thickness. Right ventricular systolic function is normal. There is normal pulmonary artery systolic pressure. The tricuspid regurgitant velocity is 2.20  m/s, and with an assumed right atrial pressure of 3 mmHg, the estimated right ventricular systolic pressure is 22.4 mmHg. Left Atrium: Left atrial size was normal in size. Right Atrium: Right atrial size was normal in size. Pericardium: There is no evidence of pericardial effusion. Mitral Valve: The mitral valve is normal in structure. Mild mitral valve regurgitation. MV peak gradient, 2.8 mmHg. The mean mitral valve gradient is 2.0 mmHg. Tricuspid Valve: The tricuspid valve is normal in structure. Tricuspid valve regurgitation is trivial. Aortic Valve: The aortic valve is tricuspid. There is mild thickening of the aortic valve. Aortic valve regurgitation is trivial. Aortic valve mean gradient measures 2.0 mmHg. Aortic valve peak gradient measures 3.9 mmHg. Aortic valve area, by VTI measures 3.23 cm. Pulmonic Valve: The pulmonic valve was not well visualized. Pulmonic valve regurgitation is trivial. Aorta: Aortic dilatation noted. There is moderate dilatation of the aortic root, measuring 45 mm. Venous: The inferior vena cava was not well visualized. IAS/Shunts: The atrial septum is grossly normal.  LEFT VENTRICLE PLAX 2D LVIDd:         4.40 cm     Diastology LVIDs:         3.10 cm     LV e' medial:    8.27 cm/s LV PW:         1.20 cm     LV E/e' medial:   11.2 LV IVS:        1.10 cm     LV e' lateral:   15.10 cm/s LVOT diam:     2.20 cm     LV E/e' lateral: 6.1 LV SV:         71 LV SV Index:   40 LVOT Area:     3.80 cm  LV Volumes (MOD) LV vol d, MOD A2C: 53.4 ml LV vol d, MOD A4C: 70.8 ml LV vol s, MOD A4C: 30.7 ml LV SV MOD A4C:     70.8 ml RIGHT VENTRICLE RV Basal diam:  4.10 cm RV Mid diam:    3.80 cm RV S prime:     15.20 cm/s TAPSE (M-mode): 2.4 cm LEFT ATRIUM             Index        RIGHT ATRIUM           Index LA diam:        3.80 cm 2.17 cm/m   RA Area:     17.20 cm LA Vol (A2C):   56.2 ml 32.09 ml/m  RA Volume:   48.70 ml  27.81 ml/m LA Vol (A4C):   38.4 ml 21.92 ml/m LA Biplane Vol: 50.1 ml 28.60 ml/m  AORTIC VALVE                    PULMONIC VALVE AV Area (Vmax):    3.34 cm     PV Vmax:       0.92 m/s AV Area (Vmean):   2.74 cm     PV Peak grad:  3.4 mmHg AV Area (VTI):  3.23 cm AV Vmax:           98.80 cm/s AV Vmean:          67.000 cm/s AV VTI:            0.219 m AV Peak Grad:      3.9 mmHg AV Mean Grad:      2.0 mmHg LVOT Vmax:         86.90 cm/s LVOT Vmean:        48.300 cm/s LVOT VTI:          0.186 m LVOT/AV VTI ratio: 0.85  AORTA Ao Root diam: 4.50 cm Ao Asc diam:  3.60 cm MITRAL VALVE               TRICUSPID VALVE MV Area (PHT): 4.83 cm    TR Peak grad:   19.4 mmHg MV Area VTI:   3.41 cm    TR Vmax:        220.00 cm/s MV Peak grad:  2.8 mmHg MV Mean grad:  2.0 mmHg    SHUNTS MV Vmax:       0.84 m/s    Systemic VTI:  0.19 m MV Vmean:      62.7 cm/s   Systemic Diam: 2.20 cm MV Decel Time: 157 msec MV E velocity: 92.40 cm/s MV A velocity: 80.90 cm/s MV E/A ratio:  1.14 Windell Norfolk Electronically signed by Windell Norfolk Signature Date/Time: 02/17/2024/1:50:02 PM    Final     Family Communication: Discussed with patient's daughter at bedside, she understand and agree. All questions answered.  Disposition: Status is: Inpatient Remains inpatient appropriate because: rate control, IV amiodarone  Planned Discharge Destination:  Home with Home Health palliative care     Time spent: 40 minutes  Author: Marcelino Duster, MD 02/17/2024 2:50 PM Secure chat 7am to 7pm For on call review [email or web address].

## 2024-02-17 NOTE — ED Notes (Signed)
 Pt resting in bed comfortably at this time. Pt ABCs intact. RR even and unlabored. Pt in NAD. Bed in lowest locked position. Call bell in reach.

## 2024-02-17 NOTE — ED Notes (Signed)
 Pt ABCs intact. RR even and unlabored. Pt in NAD. Bed in lowest locked position. Call bell in reach. Denies needs at this time.

## 2024-02-17 NOTE — ED Notes (Signed)
 Spoke with patient's daughter, and gave update on pt's status. Pt's daughter stated that if patient is in need of a sitter, to call her, and she will come and sit with him.

## 2024-02-17 NOTE — Progress Notes (Signed)
*  PRELIMINARY RESULTS* Echocardiogram 2D Echocardiogram has been performed.  [name] 02/17/2024, 11:35 AM

## 2024-02-17 NOTE — Consult Note (Addendum)
 Consultation Note Date: 02/17/2024 at 1115  Patient Name: Mark Carr  DOB: [birth year]  MRN: [id number]  Age / Sex: 68 y.o., male  PCP: Erasmo Downer, MD Referring Physician: Marcelino Duster, MD  HPI/Patient Profile: 68 y.o. male  with past medical history of alcoholic liver cirrhosis, right wrist fracture with repair (2016), and current tobacco and alcohol use admitted on 02/16/2024 with EtOH intoxication and headache.  Upon presentation to ED, patient found to be in A-fib with RVR, hyponatremic, and with thrombocytopenia.  Neurology was consulted.  Patient is being treated for A-fib with RVR with Amio gtt.  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  Cardiology had extensive discussion with family and patient in no anticoagulation and use at this time.  PMT was consulted to support pain patient and family and goals of care discussions..   Clinical Assessment and Goals of Care: Extensive chart review completed prior to meeting patient including labs, vital signs, imaging, progress notes, orders, and available advanced directive documents from current and previous encounters. I then met with patient in ED to discuss diagnosis prognosis, GOC, EOL wishes, disposition and options.  I introduced Palliative Medicine as specialized medical care for people living with serious illness. It focuses on providing relief from the symptoms and stress of a serious illness. The goal is to improve quality of life for both the patient and the family.  Symptoms assessed.  Patient endorses he feels fine but needs to urinate.  He was reminded that he has a pure wick in place.  He reaches down and starts to pull at it.  Patient encouraged to leave PureWick alone.  Education provided on pure wick as an aid to help keep him dry when he needs to urinate.   Multiple attempts made to have patient understand PureWick use.  Further education needed as patient did not urinate.  When asked where he was, he shares he is at the hospital.  When asked why he is here, he shares he had a headache.  When asked what the doctors have shared with him, he shakes his head no and says "no".  I attempted to elicit values and goals important to the patient.  I shared that his previous wishes outlined that his daughter would help make his decisions if he is unable to make them independently.  He nodded his head yes and said "[name] and [place]".  Patient unable to participate in goals of care or medical decision making independently at this time.  No acute complaints at this time.  No adjustment to [facility] needed.  After visiting with the patient, I attempted to speak with his daughter/HCPOA [name] over the phone.  No answer.  HIPAA compliant voicemail left with PMT contact info.  PMT will continue to follow and support patient and family throughout his hospitalization.    Addendum: Received message PMT found that patient's daughter is at bedside.  I went bedside in ED.  Daughter was not present and RN confirms  she has already left.  Will attempt to meet/speak with daughter at another date/time and continue goals of care discussions.  Primary Decision Maker HCPOA  Physical Exam Vitals reviewed.  Constitutional:      General: He is not in acute distress. HENT:     Head: Normocephalic.     Mouth/Throat:     Mouth: Mucous membranes are moist.  Eyes:     Pupils: Pupils are equal, round, and reactive to light.  Cardiovascular:     Pulses: Normal pulses.  Pulmonary:     Effort: Pulmonary effort is normal.  Abdominal:     Palpations: Abdomen is soft.  Musculoskeletal:        General: No tenderness.  Skin:    General: Skin is warm and dry.  Neurological:     Mental Status: He is alert.     Comments: Oriented to self and place  Psychiatric:        Mood and  Affect: Mood normal.     Palliative Assessment/Data: 50%     Thank you for this consult. Palliative medicine will continue to follow and assist holistically.   Time Total: 40 minutes  Time spent includes: Detailed review of medical records (labs, imaging, vital signs), medically appropriate exam (mental status, respiratory, cardiac, skin), discussed with treatment team, counseling and educating patient, family and staff, documenting clinical information, medication management and coordination of care.  Signed by: Georgiann Cocker, DNP, FNP-BC Palliative Medicine   Please contact Palliative Medicine Team providers via [facility] for questions and concerns.

## 2024-02-17 NOTE — ED Notes (Signed)
 Patient had taken his gown and purewick off and wet the bed.  Patient cleaned, new brief and bed linens changed.  Purewick reapplied.  Warm blankets X2 given.  Patient readjusted in the bed to a more comfortable position.  Lights turned low for comfort.

## 2024-02-17 NOTE — ED Notes (Signed)
 Pt ABCs intact. RR even and unlabored. Pt in NAD. Bed in lowest locked position. Call bell in reach.

## 2024-02-17 NOTE — ED Notes (Signed)
 Pt resting comfortably in bed at this time. Pt ABCs intact. RR even and unlabored. Pt in NAD. Bed in lowest locked position. Call bell in reach.

## 2024-02-17 NOTE — ED Notes (Signed)
 Assisted RN with changing pt brief and put socks on him

## 2024-02-17 NOTE — ED Notes (Signed)
 Pt connected to male Pure Shonna Chock in attempt to obtain fresh urine sample. Pt urine noted to be tea colored when changing out adult brief. Pt states urine has been that way, but RN noticed urine sample was not ordered for this admitted pt. Inpatient providers messaged and asked for order for urine sample.

## 2024-02-18 DIAGNOSIS — R296 Repeated falls: Secondary | ICD-10-CM | POA: Diagnosis not present

## 2024-02-18 DIAGNOSIS — K7030 Alcoholic cirrhosis of liver without ascites: Secondary | ICD-10-CM | POA: Diagnosis not present

## 2024-02-18 DIAGNOSIS — R531 Weakness: Secondary | ICD-10-CM | POA: Diagnosis not present

## 2024-02-18 DIAGNOSIS — I4891 Unspecified atrial fibrillation: Secondary | ICD-10-CM | POA: Diagnosis not present

## 2024-02-18 MED ORDER — METOPROLOL TARTRATE 25 MG PO TABS: 25.0000 mg | ORAL_TABLET | Freq: Two times a day (BID) | ORAL | 2 refills | Status: AC

## 2024-02-18 NOTE — Progress Notes (Signed)
 [facility] CARDIOLOGY PROGRESS NOTE       Patient ID: Mark Carr MRN: [id number] DOB/AGE: 1956/04/02 68 y.o.  Admit date: 02/16/2024 Referring Physician Dr. Clide Dales Primary Physician Beryle Flock, Marzella Schlein, MD  Primary Cardiologist None Reason for Consultation AF RVR  HPI: Mark Carr is a 68 y.o. male  with a past medical history of alcohol abuse, cirrhosis who presented to the ED on 02/16/2024 for headache. Found to be in AF RVR. Cardiology was consulted for further evaluation.   Interval History: -Patient seen and examined this AM, resting comfortably in hospital bed.  -Remains in NSR. No complaints of CP, SOB, palpitations.   -BP remains stable.   Review of systems complete and found to be negative unless listed above    Past Medical History:  Diagnosis Date        Acute encephalopathy 06/16/2021   Cirrhosis of liver (HCC)    Coag negative Staphylococcus bacteremia 04/21/2021   Wrist fracture     Past Surgical History:  Procedure Laterality Date   FRACTURE SURGERY     wrist   WRIST FRACTURE SURGERY Right 2016    (Not in a hospital admission)  Social History   Socioeconomic History   Marital status: Married    Spouse name: Not on file   Number of children: 4   Years of education: 11   Highest education level: 11th grade  Occupational History   Not on file  Tobacco Use   Smoking status: Every Day    Current packs/day: 0.50    Average packs/day: 0.5 packs/day for 40.0 years (20.0 ttl pk-yrs)    Types: Cigarettes   Smokeless tobacco: Never  Vaping Use   Vaping status: Never Used  Substance and Sexual Activity   Alcohol use: Yes    Alcohol/week: 4.0 standard drinks of alcohol    Types: 4 Cans of beer per week    Comment: daughter says he is heavy daily drinker and that pt minimizes ETOH intake   Drug use: Not Currently   Sexual activity: Not Currently  Other Topics Concern   Not on file  Social History Narrative   Not on file   Social Drivers  of Health   Financial Resource Strain: Low Risk  (08/10/2023)   Overall Financial Resource Strain (CARDIA)    Difficulty of Paying Living Expenses: Not hard at all  Food Insecurity: No Food Insecurity (08/10/2023)   Hunger Vital Sign    Worried About Running Out of Food in the Last Year: Never true    Ran Out of Food in the Last Year: Never true  Transportation Needs: No Transportation Needs (08/10/2023)   PRAPARE - Administrator, Civil Service (Medical): No    Lack of Transportation (Non-Medical): No  Physical Activity: Insufficiently Active (08/10/2023)   Exercise Vital Sign    Days of Exercise per Week: 3 days    Minutes of Exercise per Session: 20 min  Stress: No Stress Concern Present (08/10/2023)   [employer] of Occupational Health - Occupational Stress Questionnaire    Feeling of Stress : Not at all  Social Connections: Socially Isolated (08/10/2023)   Social Connection and Isolation Panel [NHANES]    Frequency of Communication with Friends and Family: Once a week    Frequency of Social Gatherings with Friends and Family: Once a week    Attends Religious Services: Never    Database administrator or Organizations: No    Attends Banker Meetings:  Never    Marital Status: Married  Catering manager Violence: Not At Risk (08/10/2023)   Humiliation, Afraid, Rape, and Kick questionnaire    Fear of Current or Ex-Partner: No    Emotionally Abused: No    Physically Abused: No    Sexually Abused: No    Family History  Problem Relation Age of Onset   Hypertension Mother    Deep vein thrombosis Mother    Cancer Sister        unknown type   Heart disease Brother    Cancer Maternal Grandmother      Vitals:   02/18/24 0730 02/18/24 0745 02/18/24 0748 02/18/24 0800  BP:    115/67  Pulse:    69  Resp: 16 18  13   Temp:   97.8 F (36.6 C) 97.8 F (36.6 C)  TempSrc:    Oral  SpO2:    97%  Weight:      Height:        PHYSICAL EXAM General:  Chronically ill appearing male, well nourished, in no acute distress. HEENT: Normocephalic and atraumatic. Neck: No JVD.  Lungs: Normal respiratory effort on nasal cannula. Clear bilaterally to auscultation. No wheezes, crackles, rhonchi.  Heart: HRRR. Normal S1 and S2 without gallops or murmurs.  Abdomen: Non-distended appearing.  Msk: Normal strength and tone for age. Extremities: Warm and well perfused. No clubbing, cyanosis. No edema.  Neuro: Alert and oriented X 3. Psych: Answers questions appropriately.   Labs: Basic Metabolic Panel: Recent Labs    02/17/24 0601 02/17/24 1342  NA 128* 126*  K 3.3* 3.6  CL 98 94*  CO2 24 23  GLUCOSE 154* 243*  BUN 9 8  CREATININE 0.64 0.64  CALCIUM 7.8* 7.7*  MG 1.5*  --    Liver Function Tests: Recent Labs    02/17/24 0601 02/17/24 1342  AST 137* 139*  ALT 41 46*  ALKPHOS 76 74  BILITOT 7.6*  8.2* 8.6*  PROT 6.1* 6.2*  ALBUMIN 1.6* 1.6*   Recent Labs    02/16/24 0829  LIPASE 35   CBC: Recent Labs    02/16/24 0829 02/17/24 0601  WBC 6.8 5.9  NEUTROABS 5.3  --   HGB 10.2* 9.1*  HCT 27.7* 24.8*  MCV 109.1* 107.8*  PLT 44* 33*   Cardiac Enzymes: Recent Labs    02/16/24 0829  TROPONINIHS 5   BNP: No results for input(s): "BNP" in the last 72 hours. D-Dimer: No results for input(s): "DDIMER" in the last 72 hours. Hemoglobin A1C: No results for input(s): "HGBA1C" in the last 72 hours. Fasting Lipid Panel: No results for input(s): "CHOL", "HDL", "LDLCALC", "TRIG", "CHOLHDL", "LDLDIRECT" in the last 72 hours. Thyroid Function Tests: No results for input(s): "TSH", "T4TOTAL", "T3FREE", "THYROIDAB" in the last 72 hours.  Invalid input(s): "FREET3" Anemia Panel: Recent Labs    02/17/24 0601 02/17/24 0905  VITAMINB12  --  1,294*  FOLATE 13.3  --   FERRITIN 1,651*  --   TIBC NOT CALCULATED  --   IRON 103  --      Radiology: ECHOCARDIOGRAM COMPLETE Result Date: 02/17/2024    ECHOCARDIOGRAM REPORT    Patient Name:   Mark Carr Date of Exam: 02/17/2024 Medical Rec #:  [id number]      Height:       68.0 in Accession #:    [id number]     Weight:       139.1 lb Date of Birth:  04-11-1956  BSA:          1.751 m Patient Age:    67 years       BP:           107/70 mmHg Patient Gender: M              HR:           95 bpm. Exam Location:  [facility] Procedure: 2D Echo, Cardiac Doppler, Color Doppler and Strain Analysis (Both            Spectral and Color Flow Doppler were utilized during procedure). Indications:     Atrial Fibrillation  History:         Patient has prior history of Echocardiogram examinations, most                  recent 04/21/2021. Arrythmias:Atrial Fibrillation,                  Signs/Symptoms:Bacteremia; Risk Factors:Hypertension and                  Current Smoker. ETOH abuse.  Sonographer:     Mikki Harbor Referring Phys:  [id number] Denissa Cozart Diagnosing Phys: Windell Norfolk  Sonographer Comments: Global longitudinal strain was attempted. IMPRESSIONS  1. Left ventricular ejection fraction, by estimation, is 55 to 60%. The left ventricle has normal function. The left ventricle has no regional wall motion abnormalities. There is mild left ventricular hypertrophy. Left ventricular diastolic parameters were normal.  2. Right ventricular systolic function is normal. The right ventricular size is mildly enlarged. There is normal pulmonary artery systolic pressure.  3. The mitral valve is normal in structure. Mild mitral valve regurgitation.  4. The aortic valve is tricuspid. There is mild thickening of the aortic valve. Aortic valve regurgitation is trivial.  5. Aortic dilatation noted. There is moderate dilatation of the aortic root, measuring 45 mm. FINDINGS  Left Ventricle: Left ventricular ejection fraction, by estimation, is 55 to 60%. The left ventricle has normal function. The left ventricle has no regional wall motion abnormalities. The left ventricular internal cavity size was normal in  size. There is  mild left ventricular hypertrophy. Left ventricular diastolic parameters were normal. Right Ventricle: The right ventricular size is mildly enlarged. No increase in right ventricular wall thickness. Right ventricular systolic function is normal. There is normal pulmonary artery systolic pressure. The tricuspid regurgitant velocity is 2.20  m/s, and with an assumed right atrial pressure of 3 mmHg, the estimated right ventricular systolic pressure is 22.4 mmHg. Left Atrium: Left atrial size was normal in size. Right Atrium: Right atrial size was normal in size. Pericardium: There is no evidence of pericardial effusion. Mitral Valve: The mitral valve is normal in structure. Mild mitral valve regurgitation. MV peak gradient, 2.8 mmHg. The mean mitral valve gradient is 2.0 mmHg. Tricuspid Valve: The tricuspid valve is normal in structure. Tricuspid valve regurgitation is trivial. Aortic Valve: The aortic valve is tricuspid. There is mild thickening of the aortic valve. Aortic valve regurgitation is trivial. Aortic valve mean gradient measures 2.0 mmHg. Aortic valve peak gradient measures 3.9 mmHg. Aortic valve area, by VTI measures 3.23 cm. Pulmonic Valve: The pulmonic valve was not well visualized. Pulmonic valve regurgitation is trivial. Aorta: Aortic dilatation noted. There is moderate dilatation of the aortic root, measuring 45 mm. Venous: The inferior vena cava was not well visualized. IAS/Shunts: The atrial septum is grossly normal.  LEFT VENTRICLE PLAX 2D LVIDd:  4.40 cm     Diastology LVIDs:         3.10 cm     LV e' medial:    8.27 cm/s LV PW:         1.20 cm     LV E/e' medial:  11.2 LV IVS:        1.10 cm     LV e' lateral:   15.10 cm/s LVOT diam:     2.20 cm     LV E/e' lateral: 6.1 LV SV:         71 LV SV Index:   40 LVOT Area:     3.80 cm  LV Volumes (MOD) LV vol d, MOD A2C: 53.4 ml LV vol d, MOD A4C: 70.8 ml LV vol s, MOD A4C: 30.7 ml LV SV MOD A4C:     70.8 ml RIGHT VENTRICLE RV  Basal diam:  4.10 cm RV Mid diam:    3.80 cm RV S prime:     15.20 cm/s TAPSE (M-mode): 2.4 cm LEFT ATRIUM             Index        RIGHT ATRIUM           Index LA diam:        3.80 cm 2.17 cm/m   RA Area:     17.20 cm LA Vol (A2C):   56.2 ml 32.09 ml/m  RA Volume:   48.70 ml  27.81 ml/m LA Vol (A4C):   38.4 ml 21.92 ml/m LA Biplane Vol: 50.1 ml 28.60 ml/m  AORTIC VALVE                    PULMONIC VALVE AV Area (Vmax):    3.34 cm     PV Vmax:       0.92 m/s AV Area (Vmean):   2.74 cm     PV Peak grad:  3.4 mmHg AV Area (VTI):     3.23 cm AV Vmax:           98.80 cm/s AV Vmean:          67.000 cm/s AV VTI:            0.219 m AV Peak Grad:      3.9 mmHg AV Mean Grad:      2.0 mmHg LVOT Vmax:         86.90 cm/s LVOT Vmean:        48.300 cm/s LVOT VTI:          0.186 m LVOT/AV VTI ratio: 0.85  AORTA Ao Root diam: 4.50 cm Ao Asc diam:  3.60 cm MITRAL VALVE               TRICUSPID VALVE MV Area (PHT): 4.83 cm    TR Peak grad:   19.4 mmHg MV Area VTI:   3.41 cm    TR Vmax:        220.00 cm/s MV Peak grad:  2.8 mmHg MV Mean grad:  2.0 mmHg    SHUNTS MV Vmax:       0.84 m/s    Systemic VTI:  0.19 m MV Vmean:      62.7 cm/s   Systemic Diam: 2.20 cm MV Decel Time: 157 msec MV E velocity: 92.40 cm/s MV A velocity: 80.90 cm/s MV E/A ratio:  1.14 Windell Norfolk Electronically signed by Windell Norfolk Signature Date/Time: 02/17/2024/1:50:02 PM    Final  ECHO as above  TELEMETRY reviewed by me 02/18/2024: sinus rhythm rate 60s  EKG reviewed by me: atrial flutter rate 147 bpm  Data reviewed by me 02/18/2024: last 24h vitals tele labs imaging I/O hospitalist progress note  Principal Problem:   Atrial fibrillation with RVR (HCC) Active Problems:   Tobacco use disorder   Generalized weakness   Protein-calorie malnutrition, severe   Alcoholic cirrhosis of liver without ascites (HCC)   Thrombocytopenia (HCC)   Frequent falls   Memory loss or impairment   Chronic anemia    ASSESSMENT AND PLAN:  Mark Carr is a 68 y.o. male  with a past medical history of alcohol abuse, cirrhosis who presented to the ED on 02/16/2024 for headache. Found to be in AF RVR. Cardiology was consulted for further evaluation.   # Atrial flutter RVR # Alcoholism # Cirrhosis Patient with long hx of alcohol use and subsequent cirrhosis, reports he is still drinking. Came in for headache and found to be in atrial flutter with rates in the 150s. Started on IV dilt and had resultant drop in BP. Echo this admission with EF 55-60%, mild LVH, mild MR, moderate aortic root dilatation at 45 mm. -Continue metoprolol tartrate 25 mg twice daily.  -Discontinue amiodarone. 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 Due to this, his CHADs-VASC score of 1, and patient not inclined to start anticoagulation will defer at this time.  Cardiology will sign off. Please haiku with questions or re-engage if needed.    This patient's plan of care was discussed and created with Dr. Corky Sing and he is in agreement.  Signed: Gale Journey, PA-C  02/18/2024, 9:21 AM [facility] Cardiology

## 2024-02-18 NOTE — Discharge Summary (Signed)
 Physician Discharge Summary   Patient: Mark Carr MRN: [id number] DOB: 23-Jun-1956  Admit date:     02/16/2024  Discharge date: 02/18/24  Discharge Physician: Mark Carr   PCP: Mark Carr   Recommendations at discharge:    PCP follow up in 1 week. Palliative care services as outpatient.  Discharge Diagnoses: Principal Problem:   Atrial fibrillation with RVR (HCC) Active Problems:   Alcoholic cirrhosis of liver without ascites (HCC)   Tobacco use disorder   Generalized weakness   Protein-calorie malnutrition, severe   Thrombocytopenia (HCC)   Frequent falls   Memory loss or impairment   Chronic anemia  Resolved Problems:   * No resolved hospital problems. *  Hospital Course: Mark Carr is a 68 y.o. male with medical history significant of alcohol abuse, liver cirrhosis currently under palliative care as outpatient, history of hepatic encephalopathy, noncompliant with medications, currently drinks around 12-18 beers every day presented to the emergency department for evaluation of alcohol intoxication, headache. Patient was found to have rapid A-fib, started on diltiazem drip with no improvement.   Cardiology consulted who started him on amiodarone drip. Later advised to taper amio due to liver failure and started Metoprolol. I discussed with patient, daughter regarding his poor prognosis. Daughter wishes to continue palliative services and transition to hospice when appropriate.  Assessment and Plan: Atrial fibrillation with rapid ventricular response Patient did not improve with Cardizem drip.  Cardiology advised amiodarone drip, rate is well-controlled.  Due to liver failure, Amiodarone tapered off. He is started on Metoprolol 25 bid, HR stable. Low chadvasc score, he is at high risk for bleeding. No anticoagulation at this time. He is hemodynamically stable to be discharged home with palliative care.   Alcohol intoxication: High risk for  withdrawal. Continue CIWA protocol. Continue thiamine, folate and multivitamin. Patient does not wish to stop drinking alcohol.   Alcohol abuse Alcoholic liver cirrhosis: Patient is not compliant with his medications. Mental status at baseline, ammonia 41. Abnormal LFT, elevated bili, low albumin.  Did not follow-up with GI recently and does not wish any further interventions.  MELD score 29, poor prognosis explained to patient's daughter. Patient is on palliative program as outpatient.  He wishes to go back home with palliative care as outpatient. Daughter wishes to transition him to hospice as appropriate.   Hyponatremia- Beer potomania Sodium remained stable at 127.   Thrombocytopenia- Due to alcohol use. Has small rectal bleeding from varices. No heparin for VTE.   Chronic anemia: Macrocytosis noted. Ferritin high. Hemoglobin stable.   Generalized weakness: Does complain of bilateral lower extremity weakness. He wishes to return home with palliative care. Daughter agreed.        Consultants: Cardiology Procedures performed: none  Disposition: Home health/ palliative services. Diet recommendation:  Discharge Diet Orders (From admission, onward)     Start     Ordered   02/18/24 0000  Diet - low sodium heart healthy        02/18/24 1132           Cardiac diet DISCHARGE MEDICATION: Allergies as of 02/18/2024   No Known Allergies      Medication List     STOP taking these medications    cephALEXin 500 MG capsule Commonly known as: KEFLEX       TAKE these medications    gabapentin 100 MG capsule Commonly known as: NEURONTIN TAKE 1 CAPSULE BY MOUTH 2 TIMES A DAY AND TAKE THREE CAPSULES BY MOUTH  EVERY NIGHT AT BEDTIME AS NEEDED FOR PAIN   lactulose 10 GM/15ML solution Commonly known as: CHRONULAC Take 45 mLs (30 g total) by mouth 2 (two) times daily.   metoprolol tartrate 25 MG tablet Commonly known as: LOPRESSOR Take 1 tablet (25 mg total) by  mouth 2 (two) times daily.   multivitamin with minerals Tabs tablet Take 1 tablet by mouth daily.   naltrexone 50 MG tablet Commonly known as: DEPADE Take 1 tablet (50 mg total) by mouth daily.   QUEtiapine 25 MG tablet Commonly known as: SEROQUEL Take 25 mg by mouth at bedtime.   thiamine 100 MG tablet Commonly known as: Vitamin B-1 Take 1 tablet (100 mg total) by mouth daily.        Follow-up Information     [facility], Mark Dare, Carr. Go in 1 week(s).   Specialty: Cardiology Contact information: [address] [place] [address] [phone]                Discharge Exam: Mark Carr Weights   02/16/24 0824  Weight: 63.1 kg      02/18/2024    9:00 AM 02/18/2024    8:00 AM 02/18/2024    7:00 AM  Vitals with BMI  Systolic 103 115 914  Diastolic 67 67 69  Pulse  69 95   General - Elderly thin built Caucasian male, no apparent distress HEENT - PERRLA, EOMI, atraumatic head, non tender sinuses. Lung - Clear, rales, rhonchi, wheezes. Heart - S1, S2 heard, irregular, tachycardia, no pedal edema. Abdomen-soft, nontender, distended, bowel sounds low. Neuro - Alert, awake and oriented x 3, non focal exam. Skin - Warm and dry.  Condition at discharge: poor  The results of significant diagnostics from this hospitalization (including imaging, microbiology, ancillary and laboratory) are listed below for reference.   Imaging Studies: ECHOCARDIOGRAM COMPLETE Result Date: 02/17/2024    ECHOCARDIOGRAM REPORT   Patient Name:   Mark Carr Date of Exam: 02/17/2024 Medical Rec #:  [id number]      Height:       68.0 in Accession #:    [id number]     Weight:       139.1 lb Date of Birth:  07/14/1956      BSA:          1.751 m Patient Age:    45 years       BP:           107/70 mmHg Patient Gender: M              HR:           95 bpm. Exam Location:  [facility] Procedure: 2D Echo, Cardiac Doppler, Color Doppler and Strain Analysis (Both            Spectral and Color Flow Doppler  were utilized during procedure). Indications:     Atrial Fibrillation  History:         Patient has prior history of Echocardiogram examinations, most                  recent 04/21/2021. Arrythmias:Atrial Fibrillation,                  Signs/Symptoms:Bacteremia; Risk Factors:Hypertension and                  Current Smoker. ETOH abuse.  Sonographer:     Mikki Harbor Referring Phys:  [id number] CARALYN HUDSON Diagnosing Phys: Windell Norfolk  Sonographer Comments: Global longitudinal  strain was attempted. IMPRESSIONS  1. Left ventricular ejection fraction, by estimation, is 55 to 60%. The left ventricle has normal function. The left ventricle has no regional wall motion abnormalities. There is mild left ventricular hypertrophy. Left ventricular diastolic parameters were normal.  2. Right ventricular systolic function is normal. The right ventricular size is mildly enlarged. There is normal pulmonary artery systolic pressure.  3. The mitral valve is normal in structure. Mild mitral valve regurgitation.  4. The aortic valve is tricuspid. There is mild thickening of the aortic valve. Aortic valve regurgitation is trivial.  5. Aortic dilatation noted. There is moderate dilatation of the aortic root, measuring 45 mm. FINDINGS  Left Ventricle: Left ventricular ejection fraction, by estimation, is 55 to 60%. The left ventricle has normal function. The left ventricle has no regional wall motion abnormalities. The left ventricular internal cavity size was normal in size. There is  mild left ventricular hypertrophy. Left ventricular diastolic parameters were normal. Right Ventricle: The right ventricular size is mildly enlarged. No increase in right ventricular wall thickness. Right ventricular systolic function is normal. There is normal pulmonary artery systolic pressure. The tricuspid regurgitant velocity is 2.20  m/s, and with an assumed right atrial pressure of 3 mmHg, the estimated right ventricular systolic pressure is  22.4 mmHg. Left Atrium: Left atrial size was normal in size. Right Atrium: Right atrial size was normal in size. Pericardium: There is no evidence of pericardial effusion. Mitral Valve: The mitral valve is normal in structure. Mild mitral valve regurgitation. MV peak gradient, 2.8 mmHg. The mean mitral valve gradient is 2.0 mmHg. Tricuspid Valve: The tricuspid valve is normal in structure. Tricuspid valve regurgitation is trivial. Aortic Valve: The aortic valve is tricuspid. There is mild thickening of the aortic valve. Aortic valve regurgitation is trivial. Aortic valve mean gradient measures 2.0 mmHg. Aortic valve peak gradient measures 3.9 mmHg. Aortic valve area, by VTI measures 3.23 cm. Pulmonic Valve: The pulmonic valve was not well visualized. Pulmonic valve regurgitation is trivial. Aorta: Aortic dilatation noted. There is moderate dilatation of the aortic root, measuring 45 mm. Venous: The inferior vena cava was not well visualized. IAS/Shunts: The atrial septum is grossly normal.  LEFT VENTRICLE PLAX 2D LVIDd:         4.40 cm     Diastology LVIDs:         3.10 cm     LV e' medial:    8.27 cm/s LV PW:         1.20 cm     LV E/e' medial:  11.2 LV IVS:        1.10 cm     LV e' lateral:   15.10 cm/s LVOT diam:     2.20 cm     LV E/e' lateral: 6.1 LV SV:         71 LV SV Index:   40 LVOT Area:     3.80 cm  LV Volumes (MOD) LV vol d, MOD A2C: 53.4 ml LV vol d, MOD A4C: 70.8 ml LV vol s, MOD A4C: 30.7 ml LV SV MOD A4C:     70.8 ml RIGHT VENTRICLE RV Basal diam:  4.10 cm RV Mid diam:    3.80 cm RV S prime:     15.20 cm/s TAPSE (M-mode): 2.4 cm LEFT ATRIUM             Index        RIGHT ATRIUM  Index LA diam:        3.80 cm 2.17 cm/m   RA Area:     17.20 cm LA Vol (A2C):   56.2 ml 32.09 ml/m  RA Volume:   48.70 ml  27.81 ml/m LA Vol (A4C):   38.4 ml 21.92 ml/m LA Biplane Vol: 50.1 ml 28.60 ml/m  AORTIC VALVE                    PULMONIC VALVE AV Area (Vmax):    3.34 cm     PV Vmax:       0.92 m/s  AV Area (Vmean):   2.74 cm     PV Peak grad:  3.4 mmHg AV Area (VTI):     3.23 cm AV Vmax:           98.80 cm/s AV Vmean:          67.000 cm/s AV VTI:            0.219 m AV Peak Grad:      3.9 mmHg AV Mean Grad:      2.0 mmHg LVOT Vmax:         86.90 cm/s LVOT Vmean:        48.300 cm/s LVOT VTI:          0.186 m LVOT/AV VTI ratio: 0.85  AORTA Ao Root diam: 4.50 cm Ao Asc diam:  3.60 cm MITRAL VALVE               TRICUSPID VALVE MV Area (PHT): 4.83 cm    TR Peak grad:   19.4 mmHg MV Area VTI:   3.41 cm    TR Vmax:        220.00 cm/s MV Peak grad:  2.8 mmHg MV Mean grad:  2.0 mmHg    SHUNTS MV Vmax:       0.84 m/s    Systemic VTI:  0.19 m MV Vmean:      62.7 cm/s   Systemic Diam: 2.20 cm MV Decel Time: 157 msec MV E velocity: 92.40 cm/s MV A velocity: 80.90 cm/s MV E/A ratio:  1.14 Mellody Drown [facility] Electronically signed by Windell Norfolk Signature Date/Time: 02/17/2024/1:50:02 PM    Final     Microbiology: Results for orders placed or performed during the hospital encounter of 02/16/24  Resp panel by RT-PCR (RSV, Flu A&B, Covid) Anterior Nasal Swab     Status: None   Collection Time: 02/16/24  8:29 AM   Specimen: Anterior Nasal Swab  Result Value Ref Range Status   SARS Coronavirus 2 by RT PCR NEGATIVE NEGATIVE Final    Comment: (NOTE) SARS-CoV-2 target nucleic acids are NOT DETECTED.  The SARS-CoV-2 RNA is generally detectable in upper respiratory specimens during the acute phase of infection. The lowest concentration of SARS-CoV-2 viral copies this assay can detect is 138 copies/mL. A negative result does not preclude SARS-Cov-2 infection and should not be used as the sole basis for treatment or other patient management decisions. A negative result may occur with  improper specimen collection/handling, submission of specimen other than nasopharyngeal swab, presence of viral mutation(s) within the areas targeted by this assay, and inadequate number of viral copies(<138 copies/mL). A negative  result must be combined with clinical observations, patient history, and epidemiological information. The expected result is Negative.  Fact Sheet for Patients:  BloggerCourse.com  Fact Sheet for Healthcare Providers:  SeriousBroker.it  This test is no t yet approved or cleared by the  [place] [employer] and  has been authorized for detection and/or diagnosis of SARS-CoV-2 by FDA under an TEFL teacher (EUA). This EUA will remain  in effect (meaning this test can be used) for the duration of the COVID-19 declaration under Section 564(b)(1) of the Act, 21 U.S.C.section 360bbb-3(b)(1), unless the authorization is terminated  or revoked sooner.       Influenza A by PCR NEGATIVE NEGATIVE Final   Influenza B by PCR NEGATIVE NEGATIVE Final    Comment: (NOTE) The Xpert Xpress SARS-CoV-2/FLU/RSV plus assay is intended as an aid in the diagnosis of influenza from Nasopharyngeal swab specimens and should not be used as a sole basis for treatment. Nasal washings and aspirates are unacceptable for Xpert Xpress SARS-CoV-2/FLU/RSV testing.  Fact Sheet for Patients: BloggerCourse.com  Fact Sheet for Healthcare Providers: SeriousBroker.it  This test is not yet approved or cleared by the [place] FDA and has been authorized for detection and/or diagnosis of SARS-CoV-2 by FDA under an Emergency Use Authorization (EUA). This EUA will remain in effect (meaning this test can be used) for the duration of the COVID-19 declaration under Section 564(b)(1) of the Act, 21 U.S.C. section 360bbb-3(b)(1), unless the authorization is terminated or revoked.     Resp Syncytial Virus by PCR NEGATIVE NEGATIVE Final    Comment: (NOTE) Fact Sheet for Patients: BloggerCourse.com  Fact Sheet for Healthcare Providers: SeriousBroker.it  This  test is not yet approved or cleared by the [place] FDA and has been authorized for detection and/or diagnosis of SARS-CoV-2 by FDA under an Emergency Use Authorization (EUA). This EUA will remain in effect (meaning this test can be used) for the duration of the COVID-19 declaration under Section 564(b)(1) of the Act, 21 U.S.C. section 360bbb-3(b)(1), unless the authorization is terminated or revoked.  Performed at [facility], [address] [place], [place] [address]     Labs: CBC: Recent Labs  Lab 02/16/24 0829 02/17/24 0601  WBC 6.8 5.9  NEUTROABS 5.3  --   HGB 10.2* 9.1*  HCT 27.7* 24.8*  MCV 109.1* 107.8*  PLT 44* 33*   Basic Metabolic Panel: Recent Labs  Lab 02/16/24 0829 02/17/24 0601 02/17/24 1342  NA 127* 128* 126*  K 3.6 3.3* 3.6  CL 97* 98 94*  CO2 20* 24 23  GLUCOSE 98 154* 243*  BUN 6* 9 8  CREATININE 0.56* 0.64 0.64  CALCIUM 7.8* 7.8* 7.7*  MG  --  1.5*  --    Liver Function Tests: Recent Labs  Lab 02/16/24 0829 02/17/24 0601 02/17/24 1342  AST 157* 137* 139*  ALT 50* 41 46*  ALKPHOS 88 76 74  BILITOT 8.3* 7.6*  8.2* 8.6*  PROT 7.0 6.1* 6.2*  ALBUMIN 1.9* 1.6* 1.6*   CBG: No results for input(s): "GLUCAP" in the last 168 hours.  Discharge time spent: 35 minutes.  Signed: Marcelino Duster, Carr [facility] 02/18/2024

## 2024-02-18 NOTE — Progress Notes (Signed)
 [facility] 2 Liaison Note  Current patient followed at home by [facility] Palliative Medicine Team.  Received call by patient's daughter stating they would like to transition from palliative care to hospice care among discharge from [facility].  [facility] Palliative team is in agreement with this transition.  Update to Macario Golds, SW and hospital medical team.  Patient will d/c from the ED today home.  Hospice admission visit will be scheduled.  Patient already has hospital bed.  No DME needs that would hold up hospital discharge.  Hospice admission nurse will assess for any additional DME needs.  Norris Cross, RN Nurse Liaison [phone]

## 2024-03-05 DIAGNOSIS — R296 Repeated falls: Secondary | ICD-10-CM | POA: Diagnosis not present

## 2024-03-05 DIAGNOSIS — G8929 Other chronic pain: Secondary | ICD-10-CM | POA: Diagnosis not present

## 2024-03-05 DIAGNOSIS — E43 Unspecified severe protein-calorie malnutrition: Secondary | ICD-10-CM | POA: Diagnosis not present

## 2024-03-05 DIAGNOSIS — R531 Weakness: Secondary | ICD-10-CM | POA: Diagnosis not present

## 2024-04-04 DIAGNOSIS — R296 Repeated falls: Secondary | ICD-10-CM | POA: Diagnosis not present

## 2024-04-04 DIAGNOSIS — G8929 Other chronic pain: Secondary | ICD-10-CM | POA: Diagnosis not present

## 2024-04-04 DIAGNOSIS — R531 Weakness: Secondary | ICD-10-CM | POA: Diagnosis not present

## 2024-04-04 DIAGNOSIS — E43 Unspecified severe protein-calorie malnutrition: Secondary | ICD-10-CM | POA: Diagnosis not present

## 2024-04-09 DEATH — deceased
